# Patient Record
Sex: Female | Born: 1990 | Race: White | Hispanic: No | Marital: Married | Smoking: Current every day smoker
Health system: Southern US, Community
[De-identification: ages and names within clinical notes are randomized; demographics above are authoritative.]

## PROBLEM LIST (undated history)

## (undated) DIAGNOSIS — R109 Unspecified abdominal pain: Secondary | ICD-10-CM

## (undated) DIAGNOSIS — M549 Dorsalgia, unspecified: Secondary | ICD-10-CM

## (undated) DIAGNOSIS — M069 Rheumatoid arthritis, unspecified: Secondary | ICD-10-CM

## (undated) DIAGNOSIS — M5136 Other intervertebral disc degeneration, lumbar region: Secondary | ICD-10-CM

## (undated) DIAGNOSIS — R5383 Other fatigue: Secondary | ICD-10-CM

## (undated) DIAGNOSIS — Z789 Other specified health status: Secondary | ICD-10-CM

## (undated) DIAGNOSIS — J45909 Unspecified asthma, uncomplicated: Secondary | ICD-10-CM

## (undated) DIAGNOSIS — R002 Palpitations: Secondary | ICD-10-CM

## (undated) DIAGNOSIS — E039 Hypothyroidism, unspecified: Secondary | ICD-10-CM

## (undated) DIAGNOSIS — I10 Essential (primary) hypertension: Secondary | ICD-10-CM

## (undated) DIAGNOSIS — M51369 Other intervertebral disc degeneration, lumbar region without mention of lumbar back pain or lower extremity pain: Secondary | ICD-10-CM

## (undated) DIAGNOSIS — H5711 Ocular pain, right eye: Secondary | ICD-10-CM

## (undated) DIAGNOSIS — R768 Other specified abnormal immunological findings in serum: Secondary | ICD-10-CM

## (undated) DIAGNOSIS — F909 Attention-deficit hyperactivity disorder, unspecified type: Secondary | ICD-10-CM

## (undated) DIAGNOSIS — J321 Chronic frontal sinusitis: Secondary | ICD-10-CM

## (undated) HISTORY — PX: HYSTERECTOMY ABDOMINAL WITH SALPINGECTOMY: SHX6725

## (undated) HISTORY — DX: Other intervertebral disc degeneration, lumbar region without mention of lumbar back pain or lower extremity pain: M51.369

## (undated) HISTORY — DX: Other fatigue: R53.83

## (undated) HISTORY — DX: Chronic frontal sinusitis: J32.1

## (undated) HISTORY — DX: Morbid (severe) obesity due to excess calories: E66.01

## (undated) HISTORY — DX: Hypothyroidism, unspecified: E03.9

## (undated) HISTORY — DX: Other specified abnormal immunological findings in serum: R76.8

## (undated) HISTORY — PX: ABLATION: SHX5711

## (undated) HISTORY — PX: CHOLECYSTECTOMY: SHX55

## (undated) HISTORY — DX: Rheumatoid arthritis, unspecified: M06.9

## (undated) HISTORY — DX: Unspecified abdominal pain: R10.9

## (undated) HISTORY — DX: Other intervertebral disc degeneration, lumbar region: M51.36

## (undated) HISTORY — DX: Palpitations: R00.2

## (undated) HISTORY — DX: Ocular pain, right eye: H57.11

## (undated) HISTORY — DX: Attention-deficit hyperactivity disorder, unspecified type: F90.9

---

## 2018-04-13 ENCOUNTER — Other Ambulatory Visit: Payer: Self-pay

## 2018-04-13 ENCOUNTER — Encounter (HOSPITAL_COMMUNITY): Payer: Self-pay | Admitting: Emergency Medicine

## 2018-04-13 ENCOUNTER — Emergency Department (HOSPITAL_COMMUNITY)
Admission: EM | Admit: 2018-04-13 | Discharge: 2018-04-13 | Disposition: A | Discharge status: Home / Self Care | Payer: Self-pay | Attending: Emergency Medicine | Admitting: Emergency Medicine

## 2018-04-13 DIAGNOSIS — J45909 Unspecified asthma, uncomplicated: Secondary | ICD-10-CM | POA: Insufficient documentation

## 2018-04-13 DIAGNOSIS — R42 Dizziness and giddiness: Secondary | ICD-10-CM | POA: Insufficient documentation

## 2018-04-13 DIAGNOSIS — F17210 Nicotine dependence, cigarettes, uncomplicated: Secondary | ICD-10-CM | POA: Insufficient documentation

## 2018-04-13 DIAGNOSIS — G8929 Other chronic pain: Secondary | ICD-10-CM | POA: Insufficient documentation

## 2018-04-13 DIAGNOSIS — M5442 Lumbago with sciatica, left side: Secondary | ICD-10-CM | POA: Insufficient documentation

## 2018-04-13 HISTORY — DX: Dorsalgia, unspecified: M54.9

## 2018-04-13 HISTORY — DX: Unspecified asthma, uncomplicated: J45.909

## 2018-04-13 LAB — URINALYSIS, ROUTINE W REFLEX MICROSCOPIC
Bilirubin Urine: NEGATIVE
GLUCOSE, UA: NEGATIVE mg/dL
HGB URINE DIPSTICK: NEGATIVE
Ketones, ur: NEGATIVE mg/dL
Leukocytes, UA: NEGATIVE
Nitrite: NEGATIVE
PH: 7 (ref 5.0–8.0)
PROTEIN: NEGATIVE mg/dL
Specific Gravity, Urine: 1.008 (ref 1.005–1.030)

## 2018-04-13 LAB — CBC WITH DIFFERENTIAL/PLATELET
Abs Immature Granulocytes: 0.03 10*3/uL (ref 0.00–0.07)
BASOS ABS: 0 10*3/uL (ref 0.0–0.1)
Basophils Relative: 0 %
Eosinophils Absolute: 0.3 10*3/uL (ref 0.0–0.5)
Eosinophils Relative: 3 %
HEMATOCRIT: 40.8 % (ref 36.0–46.0)
Hemoglobin: 13.1 g/dL (ref 12.0–15.0)
IMMATURE GRANULOCYTES: 0 %
LYMPHS ABS: 2.5 10*3/uL (ref 0.7–4.0)
LYMPHS PCT: 25 %
MCH: 27.5 pg (ref 26.0–34.0)
MCHC: 32.1 g/dL (ref 30.0–36.0)
MCV: 85.7 fL (ref 80.0–100.0)
Monocytes Absolute: 0.6 10*3/uL (ref 0.1–1.0)
Monocytes Relative: 6 %
NRBC: 0 % (ref 0.0–0.2)
Neutro Abs: 6.5 10*3/uL (ref 1.7–7.7)
Neutrophils Relative %: 66 %
Platelets: 376 10*3/uL (ref 150–400)
RBC: 4.76 MIL/uL (ref 3.87–5.11)
RDW: 15.8 % — AB (ref 11.5–15.5)
WBC: 9.9 10*3/uL (ref 4.0–10.5)

## 2018-04-13 LAB — COMPREHENSIVE METABOLIC PANEL
ALBUMIN: 3.8 g/dL (ref 3.5–5.0)
ALT: 28 U/L (ref 0–44)
AST: 24 U/L (ref 15–41)
Alkaline Phosphatase: 62 U/L (ref 38–126)
Anion gap: 7 (ref 5–15)
BILIRUBIN TOTAL: 0.3 mg/dL (ref 0.3–1.2)
BUN: 8 mg/dL (ref 6–20)
CO2: 26 mmol/L (ref 22–32)
Calcium: 8.5 mg/dL — ABNORMAL LOW (ref 8.9–10.3)
Chloride: 109 mmol/L (ref 98–111)
Creatinine, Ser: 0.72 mg/dL (ref 0.44–1.00)
GFR calc Af Amer: >60 mL/min (ref >60)
GFR calc non Af Amer: >60 mL/min (ref >60)
GLUCOSE: 86 mg/dL (ref 70–99)
POTASSIUM: 3.8 mmol/L (ref 3.5–5.1)
Sodium: 142 mmol/L (ref 135–145)
TOTAL PROTEIN: 7.2 g/dL (ref 6.5–8.1)

## 2018-04-13 LAB — HCG, SERUM, QUALITATIVE: Preg, Serum: NEGATIVE

## 2018-04-13 MED ORDER — IBUPROFEN 600 MG PO TABS: 600.0000 mg | ORAL_TABLET | Freq: Four times a day (QID) | ORAL | 0 refills | Status: DC | PRN

## 2018-04-13 MED ORDER — MECLIZINE HCL 25 MG PO TABS: 25.0000 mg | ORAL_TABLET | Freq: Three times a day (TID) | ORAL | 0 refills | Status: DC | PRN

## 2018-04-13 MED ORDER — KETOROLAC TROMETHAMINE 60 MG/2ML IM SOLN
60.0000 mg | Freq: Once | INTRAMUSCULAR | Status: AC
  Administered 2018-04-13: 60 mg via INTRAMUSCULAR
  Filled 2018-04-13: qty 2

## 2018-04-13 MED ORDER — ACETAMINOPHEN 500 MG PO TABS
1000.0000 mg | ORAL_TABLET | Freq: Once | ORAL | Status: AC
  Administered 2018-04-13: 1000 mg via ORAL
  Filled 2018-04-13: qty 2

## 2018-04-13 NOTE — Discharge Instructions (Signed)
Baclofen may be causing her dizziness.  Try to limit use or take at night.  You may use Tylenol in addition to ibuprofen as needed for pain.  May also treat with heating pad establish care with a primary physician for ongoing medical needs.
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
## 2018-04-13 NOTE — ED Notes (Signed)
Pt informed we need a urine sample. Pt reports she cannot give a sample at this time.

## 2018-04-13 NOTE — ED Triage Notes (Addendum)
Patient complaining of lower back pain x 1 1/2 weeks. States she has history of back pain. Denies dysuria. Patient also complains of dizziness x 2 days.

## 2020-01-11 ENCOUNTER — Encounter (INDEPENDENT_AMBULATORY_CARE_PROVIDER_SITE_OTHER): Payer: Self-pay | Admitting: Nurse Practitioner

## 2020-01-11 ENCOUNTER — Other Ambulatory Visit: Payer: Self-pay

## 2020-01-11 ENCOUNTER — Ambulatory Visit (INDEPENDENT_AMBULATORY_CARE_PROVIDER_SITE_OTHER): Payer: Medicaid Other | Admitting: Nurse Practitioner

## 2020-01-11 VITALS — BP 130/100 | HR 96 | Temp 97.5°F | Ht 67.0 in | Wt 289.4 lb

## 2020-01-11 DIAGNOSIS — I10 Essential (primary) hypertension: Secondary | ICD-10-CM

## 2020-01-11 DIAGNOSIS — Z6841 Body Mass Index (BMI) 40.0 and over, adult: Secondary | ICD-10-CM

## 2020-01-11 DIAGNOSIS — F329 Major depressive disorder, single episode, unspecified: Secondary | ICD-10-CM

## 2020-01-11 DIAGNOSIS — M069 Rheumatoid arthritis, unspecified: Secondary | ICD-10-CM

## 2020-01-11 DIAGNOSIS — R41840 Attention and concentration deficit: Secondary | ICD-10-CM | POA: Diagnosis not present

## 2020-01-11 DIAGNOSIS — R5383 Other fatigue: Secondary | ICD-10-CM

## 2020-01-11 DIAGNOSIS — M5136 Other intervertebral disc degeneration, lumbar region: Secondary | ICD-10-CM

## 2020-01-11 DIAGNOSIS — F32A Depression, unspecified: Secondary | ICD-10-CM

## 2020-01-11 MED ORDER — HYDROCHLOROTHIAZIDE 12.5 MG PO TABS: 12.5000 mg | ORAL_TABLET | Freq: Every day | ORAL | 0 refills | Status: DC

## 2020-01-11 MED ORDER — HYDROCHLOROTHIAZIDE 12.5 MG PO TABS: 25.0000 mg | ORAL_TABLET | Freq: Every day | ORAL | 0 refills | Status: DC

## 2020-01-11 NOTE — Progress Notes (Signed)
Subjective:  Patient ID: Kristin Baker, female    DOB: 01/05/1991  Age: 29 y.o. MRN: [id number]  CC:  Chief Complaint  Patient presents with  . Hypertension    wants medication  . ADHD  . Back Pain    wants referral to orthopedic      HPI  This patient arrives today to establish care this office.  She tells me that she was without health insurance for some time so was without a primary care provider.  Now that she has health insurance she is trying to establish care with a primary care provider.  Her main complaints today include difficulties with attention, depressed mood, and back pain.  She tells me that she was diagnosed with ADHD as an adolescent.  She used to be on Adderall and Seroquel for insomnia.  She would like to be evaluated and treated for her ADHD.  She denies any suicidal thoughts.  She tells me that she does have a depressed mood intermittently and will sometimes go weeks without brushing her teeth or cleaning her home due to her mood.  As far as her back pain is concerned she tells me she has a history of degenerative disc disease and used to follow with orthopedic surgeon for treatment of this.  She would like to be referred back to an orthopedic surgeon for possible.   Past Medical History:  Diagnosis Date  . Asthma   . Back pain       No family history on file.  Social History   Social History Narrative  . Not on file   Social History   Tobacco Use  . Smoking status: Current Every Day Smoker  . Smokeless tobacco: Never Used  Substance Use Topics  . Alcohol use: Yes    Comment: occasionally     No outpatient medications have been marked as taking for the 01/11/20 encounter (Office Visit) with Ailene Ards, NP.    ROS:  Review of Systems  Eyes: Negative for blurred vision.  Respiratory: Negative for shortness of breath.   Cardiovascular: Negative for chest pain.  Musculoskeletal: Positive for joint pain.  Neurological: Positive for  dizziness.  Psychiatric/Behavioral: Negative for suicidal ideas. The patient has insomnia.        (+) difficulty with attention     Objective:   Today's Vitals: BP (!) 130/100 (BP Location: Left Arm, Patient Position: Sitting, Cuff Size: Normal)   Pulse 96   Temp (!) 97.5 F (36.4 C) (Temporal)   Ht '5\' 7"'  (1.702 m)   Wt 289 lb 6.4 oz (131.3 kg)   SpO2 95%   BMI 45.33 kg/m  Vitals with BMI 01/11/2020 04/13/2018 04/13/2018  Height '5\' 7"'  - '5\' 7"'   Weight 289 lbs 6 oz - 220 lbs  BMI 65.46 - 50.35  Systolic 465 - -  Diastolic 681 - -  Pulse 96 62 -     Physical Exam Vitals reviewed.  Constitutional:      General: She is not in acute distress.    Appearance: Normal appearance.  HENT:     Head: Normocephalic and atraumatic.  Neck:     Vascular: No carotid bruit.  Cardiovascular:     Rate and Rhythm: Normal rate and regular rhythm.     Pulses: Normal pulses.     Heart sounds: Normal heart sounds.  Pulmonary:     Effort: Pulmonary effort is normal.     Breath sounds: Normal breath sounds.  Skin:  General: Skin is warm and dry.  Neurological:     General: No focal deficit present.     Mental Status: She is alert and oriented to person, place, and time.  Psychiatric:        Mood and Affect: Mood normal.        Behavior: Behavior normal.        Judgment: Judgment normal.          Assessment and Plan   1. Hypertension, unspecified type   2. Attention deficit   3. Depression, unspecified depression type   4. DDD (degenerative disc disease), lumbar   5. Class 3 severe obesity with serious comorbidity and body mass index (BMI) of 45.0 to 49.9 in adult, unspecified obesity type ([place])   6. Fatigue, unspecified type   7. Rheumatoid arthritis, involving unspecified site, unspecified whether rheumatoid factor present ([place])      Plan: 1.  Blood pressure a bit elevated today.  Will initiate her on low-dose hydrochlorothiazide.  She will follow-up in 2 weeks to repeat  CMP and check on her blood pressure. 2., 3., 6.  We will refer her to psychiatry for further evaluation and management of her ADHD and depression. 4.  We will refer her to orthopedic surgeon for assistance with evaluating and managing her degenerative disc disease 5.  We will collect blood work for further evaluation.  We will need to discuss lifestyle changes when I see her next office visit. 7.  During review of systems she did mention she has quite a bit of joint pain and was once diagnosed with rheumatoid arthritis.  She is concerned she may have lupus as she has a family history of this as well.  I will start by checking some additional blood work today for further evaluation.  Tests ordered Orders Placed This Encounter  Procedures  . CBC  . CMP with eGFR([facility])  . Lipid Panel  . Hemoglobin A1c  . TSH  . T3, Free  . T4, Free  . ANA  . Anti-Smith antibody  . Rheumatoid Factor  . Ambulatory referral to Psychiatry  . Ambulatory referral to Orthopedic Surgery      Meds ordered this encounter  Medications  . hydrochlorothiazide (HYDRODIURIL) 12.5 MG tablet    Sig: Take 2 tablets (25 mg total) by mouth daily.    Dispense:  90 tablet    Refill:  0    Order Specific Question:   Supervising Provider    Answer:   Doree Albee [[id number]]    Patient to follow-up in 2 weeks or sooner as needed  Ailene Ards, NP

## 2020-01-11 NOTE — Addendum Note (Signed)
Addended by: Jiles Prows E on: 01/11/2020 12:12 PM   Modules accepted: Orders

## 2020-01-13 ENCOUNTER — Ambulatory Visit
Admission: EM | Admit: 2020-01-13 | Discharge: 2020-01-13 | Disposition: A | Discharge status: Home / Self Care | Payer: Medicaid Other | Attending: Emergency Medicine | Admitting: Emergency Medicine

## 2020-01-13 ENCOUNTER — Encounter (INDEPENDENT_AMBULATORY_CARE_PROVIDER_SITE_OTHER): Payer: Self-pay | Admitting: Nurse Practitioner

## 2020-01-13 ENCOUNTER — Other Ambulatory Visit: Payer: Self-pay

## 2020-01-13 ENCOUNTER — Encounter: Payer: Self-pay | Admitting: Emergency Medicine

## 2020-01-13 DIAGNOSIS — M79601 Pain in right arm: Secondary | ICD-10-CM | POA: Diagnosis not present

## 2020-01-13 DIAGNOSIS — M25521 Pain in right elbow: Secondary | ICD-10-CM | POA: Diagnosis not present

## 2020-01-13 HISTORY — DX: Essential (primary) hypertension: I10

## 2020-01-13 LAB — T4, FREE: Free T4: 0.7 ng/dL — ABNORMAL LOW (ref 0.8–1.8)

## 2020-01-13 LAB — LIPID PANEL
Cholesterol: 223 mg/dL — ABNORMAL HIGH (ref <200)
HDL: 32 mg/dL — ABNORMAL LOW (ref >=50)
LDL Cholesterol (Calc): 166 mg/dL (calc) — ABNORMAL HIGH
Non-HDL Cholesterol (Calc): 191 mg/dL (calc) — ABNORMAL HIGH (ref <130)
Total CHOL/HDL Ratio: 7 (calc) — ABNORMAL HIGH (ref <5.0)
Triglycerides: 119 mg/dL (ref <150)

## 2020-01-13 LAB — CBC
HCT: 40.2 % (ref 35.0–45.0)
Hemoglobin: 13.6 g/dL (ref 11.7–15.5)
MCH: 29.4 pg (ref 27.0–33.0)
MCHC: 33.8 g/dL (ref 32.0–36.0)
MCV: 86.8 fL (ref 80.0–100.0)
MPV: 10.9 fL (ref 7.5–12.5)
Platelets: 383 10*3/uL (ref 140–400)
RBC: 4.63 10*6/uL (ref 3.80–5.10)
RDW: 13.5 % (ref 11.0–15.0)
WBC: 9.9 10*3/uL (ref 3.8–10.8)

## 2020-01-13 LAB — ANTI-NUCLEAR AB-TITER (ANA TITER): ANA Titer 1: 1:80 {titer} — ABNORMAL HIGH

## 2020-01-13 LAB — HEMOGLOBIN A1C
Hgb A1c MFr Bld: 5.1 % of total Hgb (ref <5.7)
Mean Plasma Glucose: 100 (calc)
eAG (mmol/L): 5.5 (calc)

## 2020-01-13 LAB — COMPLETE METABOLIC PANEL WITH GFR
AG Ratio: 1.5 (calc) (ref 1.0–2.5)
ALT: 15 U/L (ref 6–29)
AST: 14 U/L (ref 10–30)
Albumin: 4.1 g/dL (ref 3.6–5.1)
Alkaline phosphatase (APISO): 78 U/L (ref 31–125)
BUN: 8 mg/dL (ref 7–25)
CO2: 23 mmol/L (ref 20–32)
Calcium: 9 mg/dL (ref 8.6–10.2)
Chloride: 105 mmol/L (ref 98–110)
Creat: 0.71 mg/dL (ref 0.50–1.10)
GFR, Est African American: 133 mL/min/{1.73_m2} (ref >=60)
GFR, Est Non African American: 115 mL/min/{1.73_m2} (ref >=60)
Globulin: 2.7 g/dL (calc) (ref 1.9–3.7)
Glucose, Bld: 87 mg/dL (ref 65–99)
Potassium: 4 mmol/L (ref 3.5–5.3)
Sodium: 136 mmol/L (ref 135–146)
Total Bilirubin: 0.3 mg/dL (ref 0.2–1.2)
Total Protein: 6.8 g/dL (ref 6.1–8.1)

## 2020-01-13 LAB — ANTI-SMITH ANTIBODY: ENA SM Ab Ser-aCnc: <1 AI

## 2020-01-13 LAB — ANA: Anti Nuclear Antibody (ANA): POSITIVE — AB

## 2020-01-13 LAB — RHEUMATOID FACTOR: Rhuematoid fact SerPl-aCnc: <14 IU/mL (ref <14)

## 2020-01-13 LAB — TSH: TSH: 4.35 mIU/L

## 2020-01-13 LAB — T3, FREE: T3, Free: 2.9 pg/mL (ref 2.3–4.2)

## 2020-01-13 MED ORDER — IBUPROFEN 800 MG PO TABS: 800.0000 mg | ORAL_TABLET | Freq: Three times a day (TID) | ORAL | 0 refills | Status: DC

## 2020-01-13 NOTE — Discharge Instructions (Signed)
X-ray was ordered /have it completed at anything tomorrow Follow RICE instruction as attached Tylenol was prescribed/take with food Follow-up with PCP Return or go to ED for worsening of symptoms

## 2020-01-13 NOTE — ED Triage Notes (Signed)
Pain to RT elbow that 3-4 weeks ago, pain then went down her forearm and now pain is up to her shoulder. Pt states she did fall a couple of times during the time is started hurting but not sure if its coming from that.

## 2020-01-13 NOTE — ED Provider Notes (Signed)
[facility]   [id number] 01/13/20 Arrival Time: 1925  Chief Complaint  Patient presents with  . Arm Pain     SUBJECTIVE: History from: patient.  Kristin Baker is a 29 y.o. female who presented to the urgent care for complaint of right elbow pain for the past 3 to 4 weeks.  Reported worsening symptoms today after working for [employer]  States she had a fall about 4 to 6 weeks ago.  Localized pain to the right elbow that radiated to right shoulder.  She describes the pain as constant and achy.  She has tried OTC medications without relief.  Her symptoms are made worse with ROM.  He denies similar symptoms in the past.  Denies chills, fever, nausea, vomiting, diarrhea  ROS: As per HPI.  All other pertinent ROS negative.     Past Medical History:  Diagnosis Date  . ADHD   . Asthma   . Back pain   . DDD (degenerative disc disease), lumbar   . Hypertension   . Rheumatoid arthritis (H[facility])    History reviewed. No pertinent surgical history. Allergies  Allergen Reactions  . Flexeril [Cyclobenzaprine] Hives   No current facility-administered medications on file prior to encounter.   Current Outpatient Medications on File Prior to Encounter  Medication Sig Dispense Refill  . hydrochlorothiazide (HYDRODIURIL) 12.5 MG tablet Take 1 tablet (12.5 mg total) by mouth daily. 90 tablet 0   Social History   Socioeconomic History  . Marital status: Media planner    Spouse name: Not on file  . Number of children: Not on file  . Years of education: Not on file  . Highest education level: Not on file  Occupational History  . Not on file  Tobacco Use  . Smoking status: Current Every Day Smoker    Packs/day: 1.00    Types: Cigarettes  . Smokeless tobacco: Never Used  Substance and Sexual Activity  . Alcohol use: Yes    Comment: occasionally  . Drug use: Never  . Sexual activity: Not on file  Other Topics Concern  . Not on file  Social History Narrative  . Not on file     Social Determinants of Health   Financial Resource Strain:   . Difficulty of Paying Living Expenses: Not on file  Food Insecurity:   . Worried About Programme researcher, broadcasting/film/video in the Last Year: Not on file  . Ran Out of Food in the Last Year: Not on file  Transportation Needs:   . Lack of Transportation (Medical): Not on file  . Lack of Transportation (Non-Medical): Not on file  Physical Activity:   . Days of Exercise per Week: Not on file  . Minutes of Exercise per Session: Not on file  Stress:   . Feeling of Stress : Not on file  Social Connections:   . Frequency of Communication with Friends and Family: Not on file  . Frequency of Social Gatherings with Friends and Family: Not on file  . Attends Religious Services: Not on file  . Active Member of Clubs or Organizations: Not on file  . Attends Banker Meetings: Not on file  . Marital Status: Not on file  Intimate Partner Violence:   . Fear of Current or Ex-Partner: Not on file  . Emotionally Abused: Not on file  . Physically Abused: Not on file  . Sexually Abused: Not on file   Family History  Problem Relation Age of Onset  . Lupus Other  OBJECTIVE:  Vitals:   01/13/20 1945 01/13/20 1947  BP:  122/82  Pulse:  99  Resp:  19  Temp:  98 F (36.7 C)  TempSrc:  Oral  SpO2:  96%  Weight: 289 lb (131.1 kg)   Height: 5\' 7"  (1.702 m)      Physical Exam Vitals and nursing note reviewed.  Constitutional:      General: She is not in acute distress.    Appearance: Normal appearance. She is normal weight. She is not ill-appearing, toxic-appearing or diaphoretic.  HENT:     Head: Normocephalic.  Cardiovascular:     Rate and Rhythm: Normal rate and regular rhythm.     Pulses: Normal pulses.     Heart sounds: Normal heart sounds. No murmur heard.  No friction rub. No gallop.   Pulmonary:     Effort: Pulmonary effort is normal. No respiratory distress.     Breath sounds: Normal breath sounds. No stridor. No  wheezing, rhonchi or rales.  Chest:     Chest wall: No tenderness.  Musculoskeletal:        General: Tenderness present.     Right elbow: Tenderness present.     Left elbow: Normal.     Comments: The right elbow is without any obvious asymmetry or deformity compared to the left elbow.  No surface trauma, ecchymosis or open wound.  Limited range of motion due to pain neurovascular status intact  Neurological:     Mental Status: She is alert and oriented to person, place, and time.     LABS:  No results found for this or any previous visit (from the past 24 hour(s)).   ASSESSMENT & PLAN:  1. Right arm pain   2. Right elbow pain     Meds ordered this encounter  Medications  . ibuprofen (ADVIL) 800 MG tablet    Sig: Take 1 tablet (800 mg total) by mouth 3 (three) times daily.    Dispense:  30 tablet    Refill:  0    Discharge Instruction.   X-ray was ordered /have it completed at anything tomorrow Follow RICE instruction as attached Tylenol was prescribed/take with food Follow-up with PCP Return or go to ED for worsening of symptoms  Reviewed expectations re: course of current medical issues. Questions answered. Outlined signs and symptoms indicating need for more acute intervention. Patient verbalized understanding. After Visit Summary given.      Note: This document was prepared using Dragon voice recognition software and may include unintentional dictation errors.    , FNP 01/13/20 2038

## 2020-01-14 ENCOUNTER — Ambulatory Visit (HOSPITAL_COMMUNITY)
Admission: RE | Admit: 2020-01-14 | Discharge: 2020-01-14 | Disposition: A | Discharge status: Home / Self Care | Payer: Medicaid Other | Source: Ambulatory Visit | Attending: Emergency Medicine | Admitting: Emergency Medicine

## 2020-01-14 ENCOUNTER — Other Ambulatory Visit: Payer: Self-pay

## 2020-01-14 DIAGNOSIS — M79601 Pain in right arm: Secondary | ICD-10-CM | POA: Insufficient documentation

## 2020-01-14 IMAGING — DX DG FOREARM 2V*R*
2 series · 2 of 2 positions shown · non-contrast
Comparison: None.

CLINICAL DATA: Fell.  Right arm pain.

EXAM:
RIGHT FOREARM - 2 VIEW

[forearm ap]
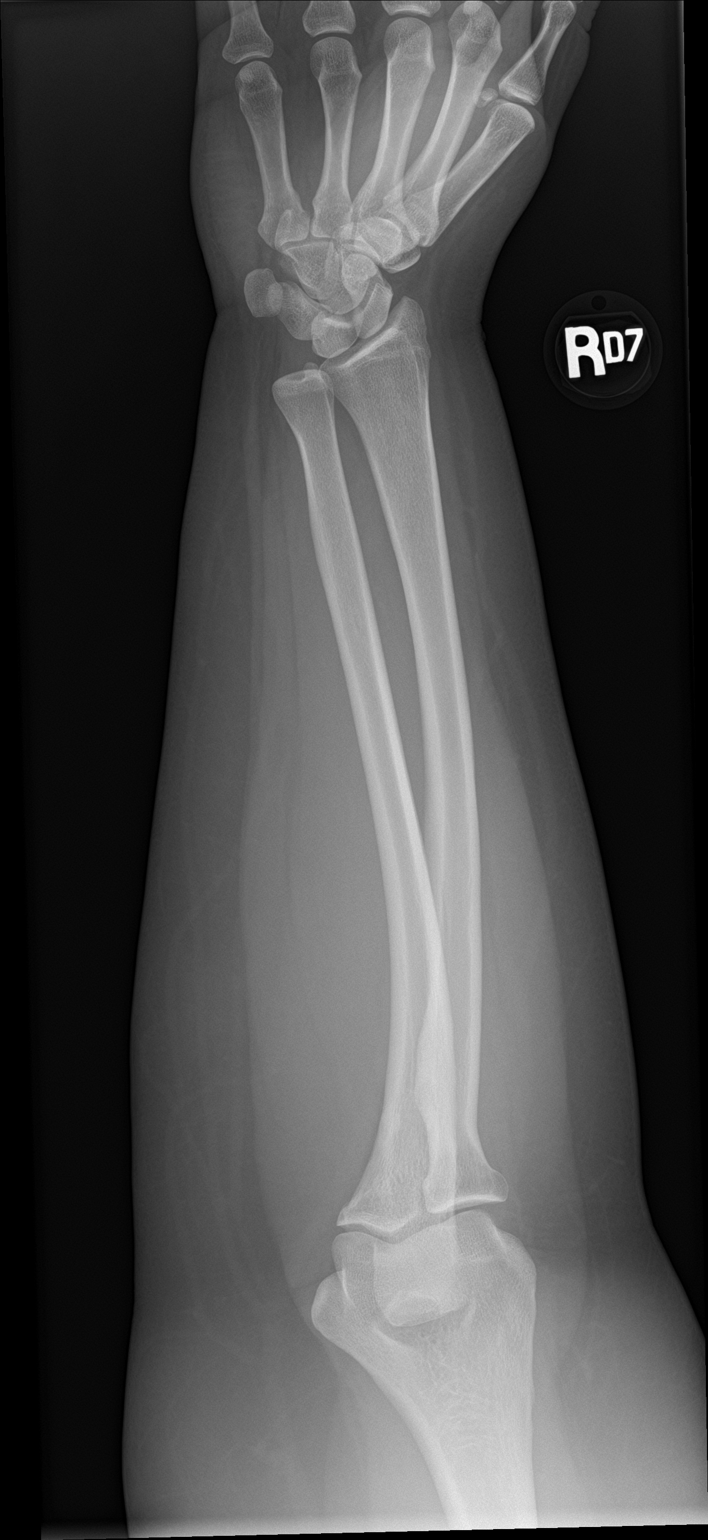

[forearm lat]
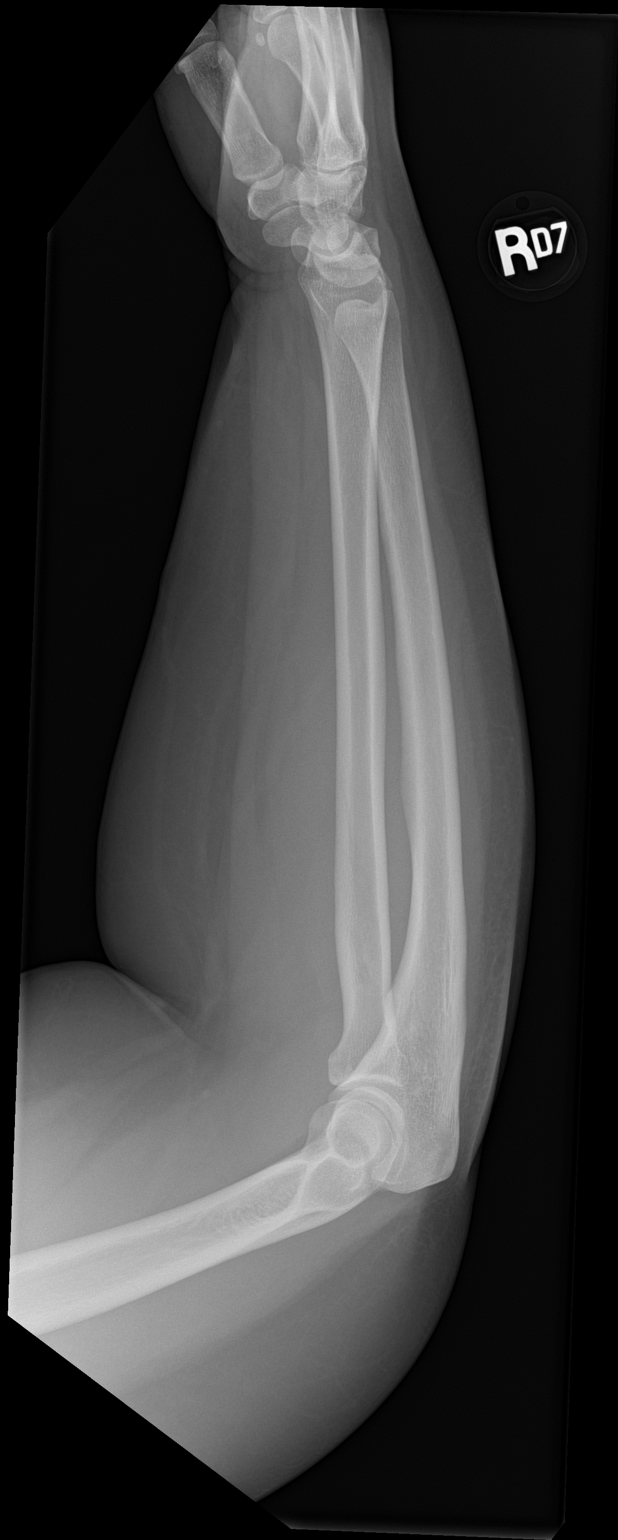

[2 of 2 positions shown; findings below may reference images not displayed]

FINDINGS: The wrist and elbow joints are maintained. No acute forearm fracture
is identified.
IMPRESSION: No acute bony findings.

## 2020-01-15 ENCOUNTER — Other Ambulatory Visit: Payer: Self-pay

## 2020-01-15 ENCOUNTER — Emergency Department (HOSPITAL_COMMUNITY): Payer: Medicaid Other

## 2020-01-15 ENCOUNTER — Emergency Department (HOSPITAL_COMMUNITY)
Admission: EM | Admit: 2020-01-15 | Discharge: 2020-01-15 | Disposition: A | Discharge status: Home / Self Care | Payer: Medicaid Other | Attending: Emergency Medicine | Admitting: Emergency Medicine

## 2020-01-15 ENCOUNTER — Encounter (HOSPITAL_COMMUNITY): Payer: Self-pay

## 2020-01-15 DIAGNOSIS — M79601 Pain in right arm: Secondary | ICD-10-CM | POA: Diagnosis present

## 2020-01-15 DIAGNOSIS — M069 Rheumatoid arthritis, unspecified: Secondary | ICD-10-CM | POA: Diagnosis not present

## 2020-01-15 DIAGNOSIS — F17210 Nicotine dependence, cigarettes, uncomplicated: Secondary | ICD-10-CM | POA: Diagnosis not present

## 2020-01-15 DIAGNOSIS — Z79899 Other long term (current) drug therapy: Secondary | ICD-10-CM | POA: Insufficient documentation

## 2020-01-15 DIAGNOSIS — I10 Essential (primary) hypertension: Secondary | ICD-10-CM | POA: Insufficient documentation

## 2020-01-15 IMAGING — DX DG ELBOW COMPLETE 3+V*R*
4 series · 4 of 4 positions shown · non-contrast
Comparison: None.

CLINICAL DATA: Injury 3-4 weeks prior, point tenderness

EXAM:
RIGHT ELBOW - COMPLETE 3+ VIEW

[elbow ap]
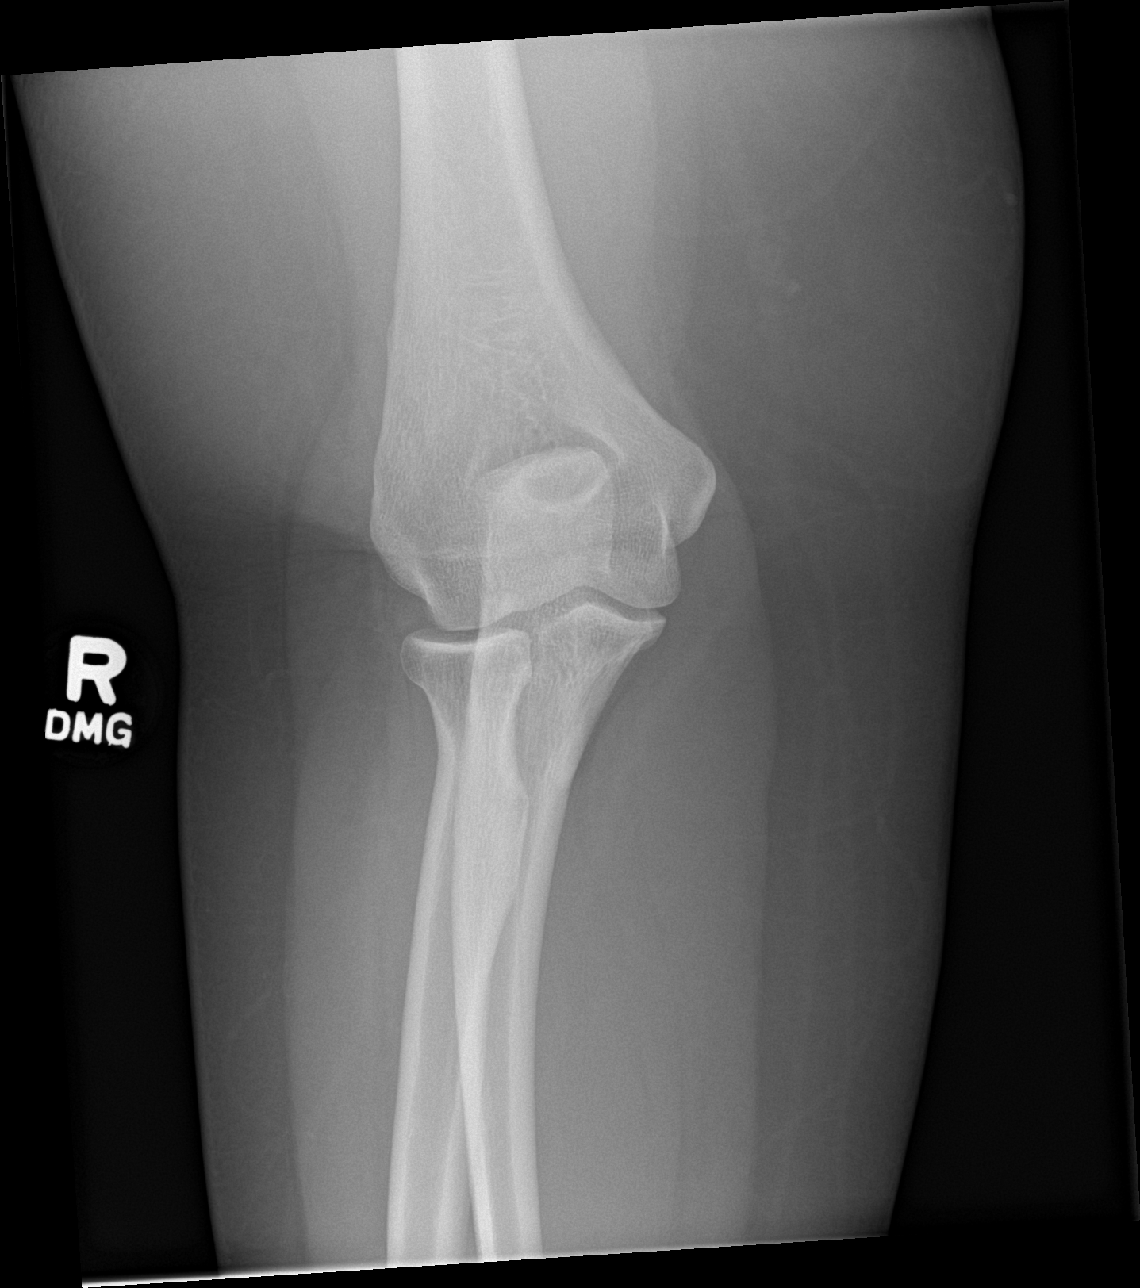

[elbow obl (1 of 2)]
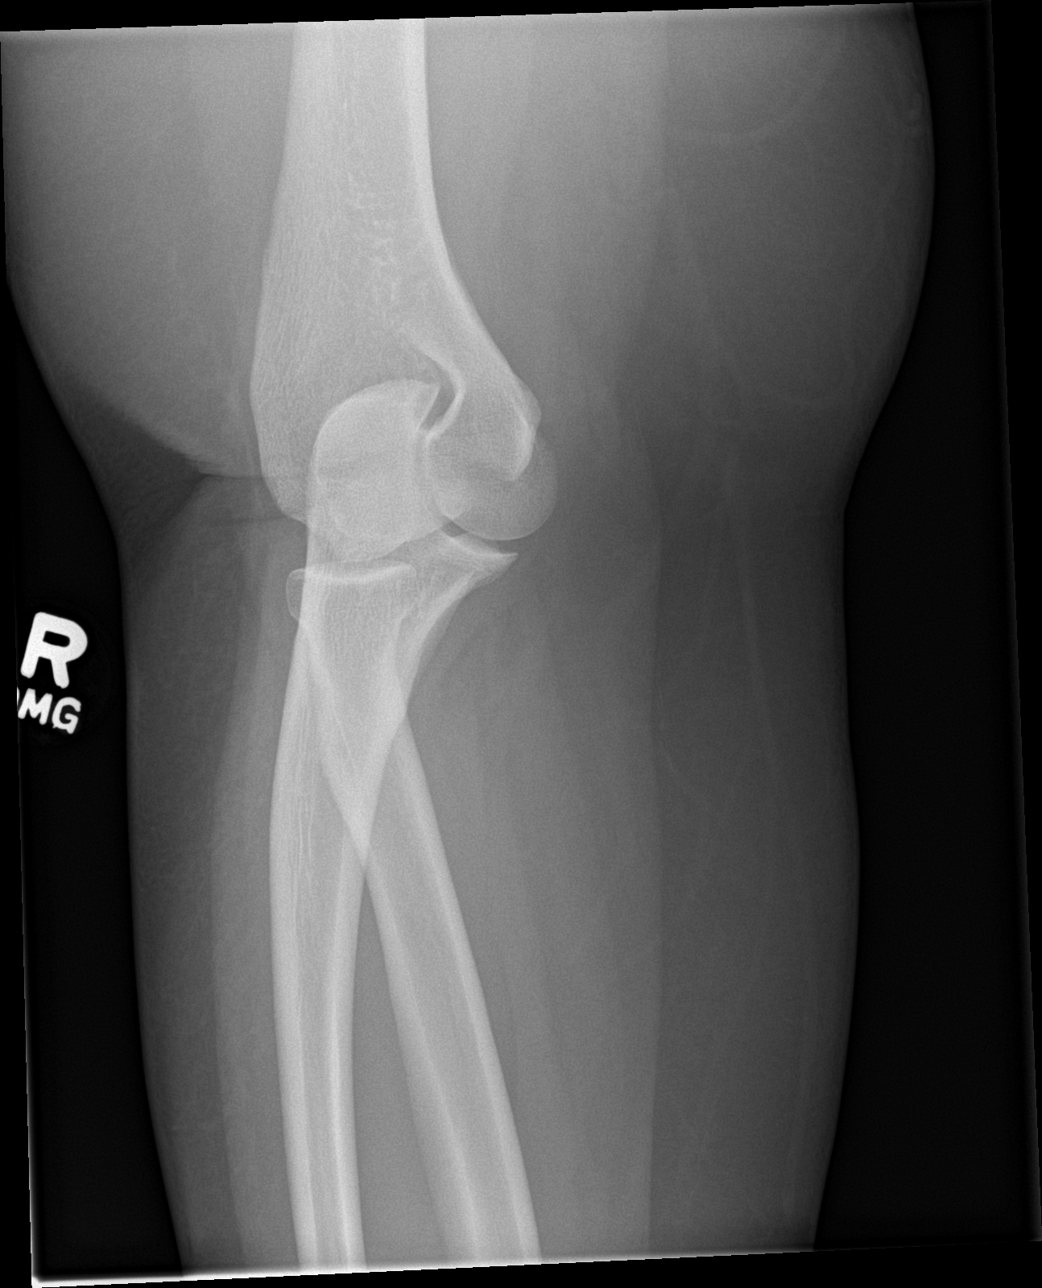

[elbow obl (2 of 2)]
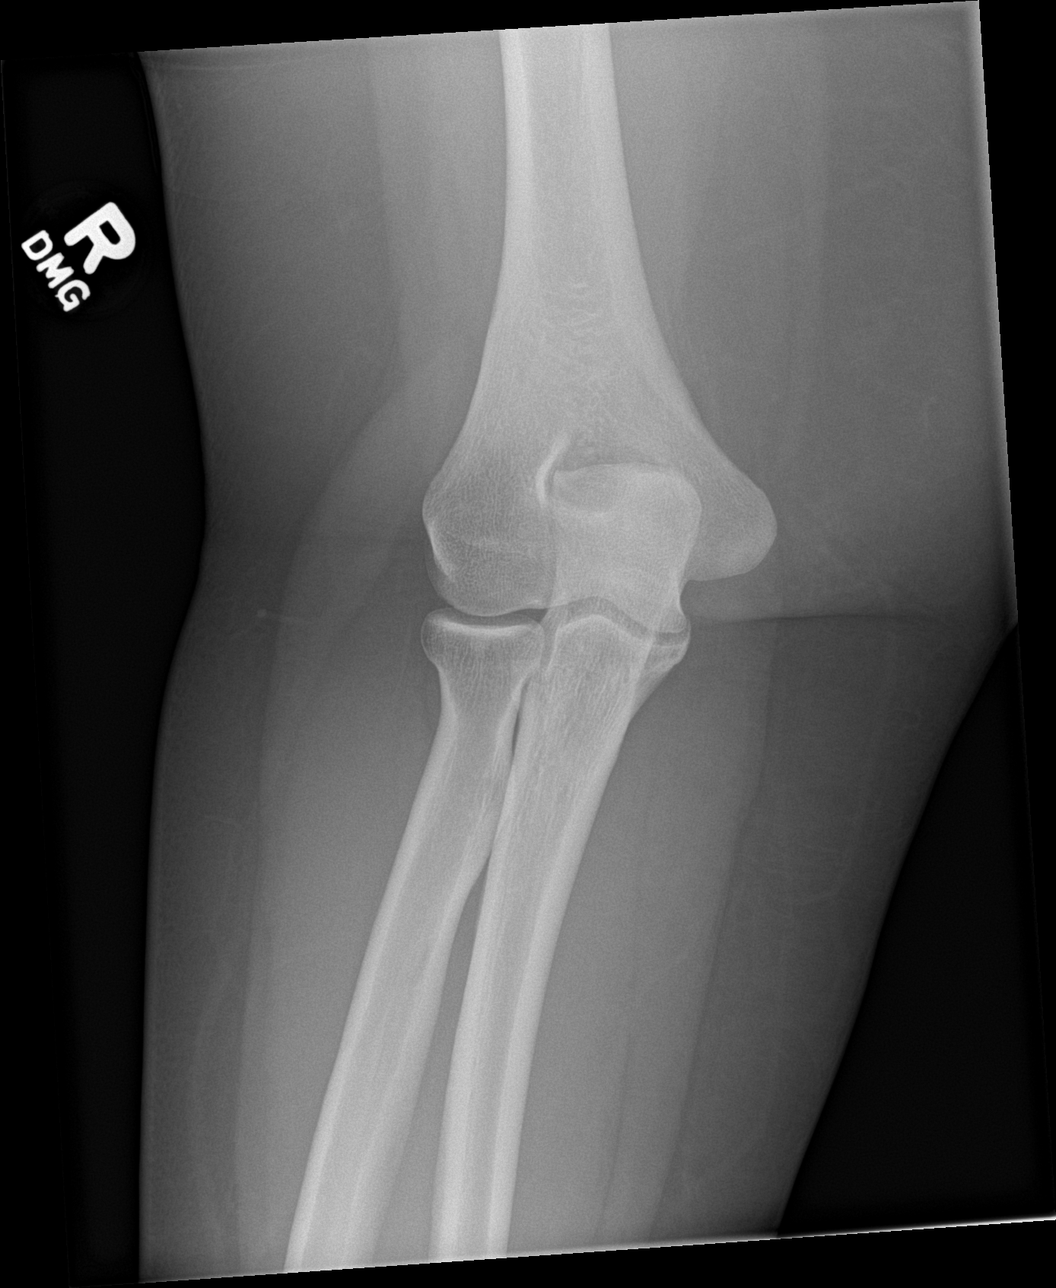

[elbow lat]
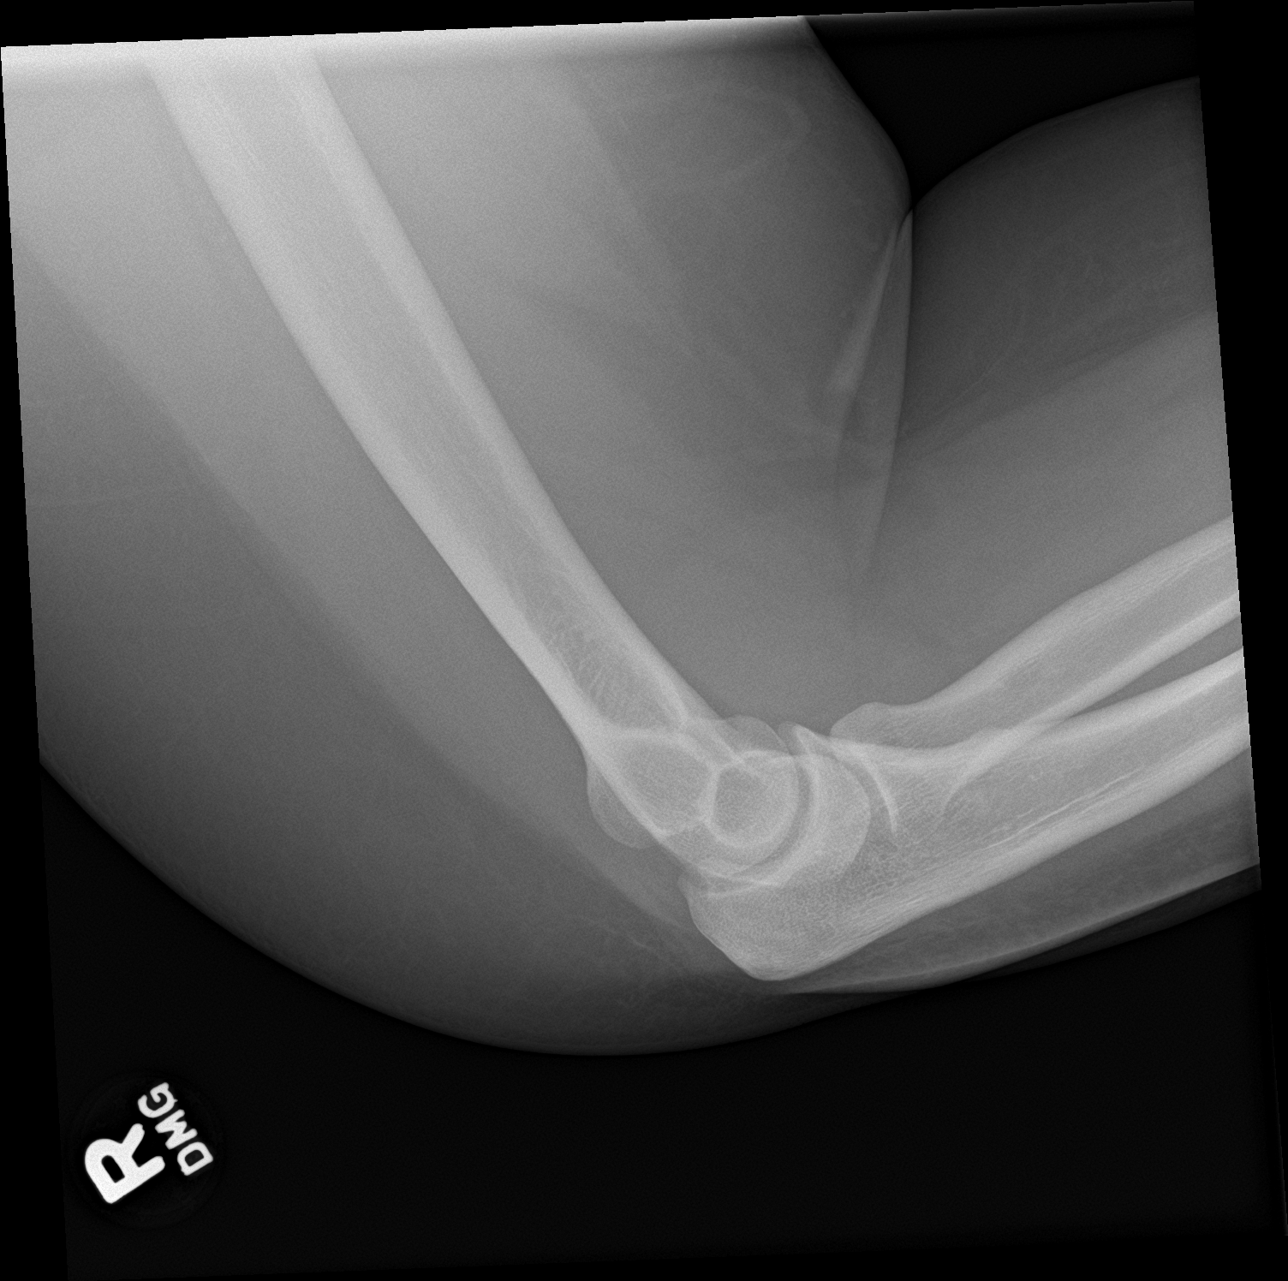

[4 of 4 positions shown; findings below may reference images not displayed]

FINDINGS: No acute or healing fractures are convincingly demonstrated. No
traumatic malalignment is seen. No sizable effusion or displacement
of the fat pads. Soft tissues are unremarkable.
IMPRESSION: No acute or healing fractures, traumatic malalignment or effusion
are convincingly demonstrated.

## 2020-01-15 NOTE — ED Triage Notes (Signed)
Pt reports she slipped in grass 3 weeks ago. Pt had xray thursday morning ordered by urgent care. Pt reports that she has been unable to get the results. Reports elbow initially hurt now hurts up to shoulder and hand swollen

## 2020-01-15 NOTE — ED Provider Notes (Signed)
[facility] EMERGENCY DEPARTMENT Provider Note   CSN: [id number] Arrival date & time: 01/15/20  1740     History Chief Complaint  Patient presents with  . Arm Pain    Kristin Baker is a 29 y.o. female possible history of ADHD, DDD, hypertension, rheumatoid arthritis presents for evaluation of right upper extremity pain that has been ongoing for last 3 weeks.  She states pain initially started about 3 weeks ago when she was taking out her trash, slipped and fell.  States she hurt her back as well as her right upper extremity.  She does not know how she injured her right upper extremity extremity pain in the right elbow and proximal forearm afterwards.  She states that she thought it was just a bad fall and so she continued to doctored at home.  She states that despite doing over-the-counter Tylenol, she was continue to have pain.  She went to urgent care yesterday and was sent to get a x-ray but did not know the results of the x-ray.  She was prescribed ibuprofen by urgent care and given a sling for support and stabilization.  She comes in the emergency room today because she states pain is worsening.  She felt like her hand was getting swollen.  She states that the pain radiates up her shoulder and down her arm.  She states it is worse with movement.  Denies any numbness/weakness.  The history is provided by the patient.       Past Medical History:  Diagnosis Date  . ADHD   . Back pain   . DDD (degenerative disc disease), lumbar   . Hypertension   . Rheumatoid arthritis [facility])     Patient Active Problem List   Diagnosis Date Noted  . DDD (degenerative disc disease), lumbar 01/11/2020    History reviewed. No pertinent surgical history.   OB History   No obstetric history on file.     Family History  Problem Relation Age of Onset  . Lupus Other     Social History   Tobacco Use  . Smoking status: Current Every Day Smoker    Packs/day: 1.00    Types: Cigarettes  .  Smokeless tobacco: Never Used  Substance Use Topics  . Alcohol use: Yes    Comment: occasionally  . Drug use: Never    Home Medications Prior to Admission medications   Medication Sig Start Date End Date Taking? Authorizing Provider  hydrochlorothiazide (HYDRODIURIL) 12.5 MG tablet Take 1 tablet (12.5 mg total) by mouth daily. 01/11/20   Elenore Paddy, NP  ibuprofen (ADVIL) 800 MG tablet Take 1 tablet (800 mg total) by mouth 3 (three) times daily. 01/13/20   Avegno, Zachery Dakins, FNP    Allergies    Flexeril [cyclobenzaprine]  Review of Systems   Review of Systems  Constitutional: Negative for fever.  Musculoskeletal:       RUE pain  Neurological: Negative for weakness and numbness.  All other systems reviewed and are negative.   Physical Exam Updated Vital Signs BP (!) 161/91 (BP Location: Left Wrist)   Pulse 81   Temp 98.6 F (37 C) (Oral)   Resp 20   SpO2 97%   Physical Exam Vitals and nursing note reviewed.  Constitutional:      Appearance: She is well-developed.  HENT:     Head: Normocephalic and atraumatic.  Eyes:     General: No scleral icterus.       Right eye: No discharge.  Left eye: No discharge.     Conjunctiva/sclera: Conjunctivae normal.  Cardiovascular:     Pulses:          Radial pulses are 2+ on the right side and 2+ on the left side.  Pulmonary:     Effort: Pulmonary effort is normal.  Musculoskeletal:     Comments: Tenderness palpation noted to the proximal right forearm as well as the elbow.  No overlying soft tissue swelling or ecchymosis noted.  Lection/extension intact but with subjective reports of pain.  She is holding in a flexed position.  Compartments are soft.  No deformity or crepitus noted.  No bony tenderness noted to the right shoulder, right forearm, right wrist.  No tenderness palpation left upper extremity.  She can easily make a fist with her right hand.  Skin:    General: Skin is warm and dry.     Comments: Good distal cap  refill.  RUE is not dusky in appearance or cool to touch.  Neurological:     Mental Status: She is alert.     Comments: Sensation intact along major nerve distributions of RUE  Psychiatric:        Speech: Speech normal.        Behavior: Behavior normal.     ED Results / Procedures / Treatments   Labs (all labs ordered are listed, but only abnormal results are displayed) Labs Reviewed - No data to display  EKG None  Radiology DG Elbow Complete Right  Result Date: 01/15/2020 CLINICAL DATA:  Injury 3-4 weeks prior, point tenderness EXAM: RIGHT ELBOW - COMPLETE 3+ VIEW COMPARISON:  None. FINDINGS: No acute or healing fractures are convincingly demonstrated. No traumatic malalignment is seen. No sizable effusion or displacement of the fat pads. Soft tissues are unremarkable. IMPRESSION: No acute or healing fractures, traumatic malalignment or effusion are convincingly demonstrated. Electronically Signed   By: Kreg Shropshire M.D.   On: 01/15/2020 19:40   DG Forearm Right  Result Date: 01/14/2020 CLINICAL DATA:  Larey Seat.  Right arm pain. EXAM: RIGHT FOREARM - 2 VIEW COMPARISON:  None. FINDINGS: The wrist and elbow joints are maintained. No acute forearm fracture is identified. IMPRESSION: No acute bony findings. Electronically Signed   By: Rudie Meyer M.D.   On: 01/14/2020 15:37    Procedures Procedures (including critical care time)  Medications Ordered in ED Medications - No data to display  ED Course  I have reviewed the triage vital signs and the nursing notes.  Pertinent labs & imaging results that were available during my care of the patient were reviewed by me and considered in my medical decision making (see chart for details).    MDM Rules/Calculators/A&P                          29 year old female who presents for evaluation of right upper extremity pain x3 days.  Started after mechanical fall.  Seen by urgent care and had x-ray done that showed no evidence of  abnormalities.  Comes in today because she continues to have pain.  She fell your hand with swelling as well.  On initial arrival, she is afebrile, nontoxic-appearing.  Vital signs are stable.  On exam, she has point tenderness noted to the right elbow and proximal forearm.  No bony tenderness of the right shoulder or right forearm or wrist.  She states that she feels like the pain radiates there.  On my evaluation, her hands appear  symmetric in appearance with no overlying soft tissue swelling, no swelling noted to arm.  Exam is not concerning for compartment syndrome.  No evidence of warmth, erythema that would be concerning for infectious etiology.  History/physical exam not concerning for ischemic limb, septic arthritis, DVT.  She does have some point tenderness noted on the right elbow so we will get a dedicated elbow film here in the ED for evaluation of any acute abnormality.  I reviewed her forearm x-ray done from yesterday which showed no evidence of acute bony abnormality.  X-ray of elbow reviewed.  No evidence of acute traumatic injury.  No evidence of joint effusion or any other worrisome or concerning signs or could be secondary for underlying fracture.  Discussed results with patient.  I discussed with her that she continue taking over-the-counter NSAIDs for pain relief.  Patient was provided a sling immobilizer by urgent care.  Instructed her to continue using that for support and stabilization.  I discussed with her that there still could be a muscle, ligament, tendon injury that would not show up on x-ray.  I discussed with her that if she continues to have persistent pain, she can follow-up with orthopedics.  We will plan to give her outpatient orthopedic referral.  At this time, given lack of acute bony injury, do not feel that narcotic pain medication is warranted in this instance. At this time, patient exhibits no emergent life-threatening condition that require further evaluation in ED.  Patient had ample opportunity for questions and discussion. All patient's questions were answered with full understanding. Strict return precautions discussed. Patient expresses understanding and agreement to plan.   Portions of this note were generated with Scientist, clinical (histocompatibility and immunogenetics). Dictation errors may occur despite best attempts at proofreading.   Final Clinical Impression(s) / ED Diagnoses Final diagnoses:  Right arm pain    Rx / DC Orders ED Discharge Orders    None       Rosana Hoes 01/15/20 2158    Bethann Berkshire, MD 01/15/20 2207

## 2020-01-15 NOTE — Discharge Instructions (Addendum)
You can take Tylenol or Ibuprofen as directed for pain. You can alternate Tylenol and Ibuprofen every 4 hours. If you take Tylenol at 1pm, then you can take Ibuprofen at 5pm. Then you can take Tylenol again at 9pm.   You can wear the sling for support and stabilization.  You should not wear it 24/7.  At times at home, you should take the arm out and do gentle range of motion exercises to prevent frozen shoulder syndrome.  As we discussed, there is no evidence of broken bones on your x-rays.  There could still be a ligament or muscle or tendon injury that is not seen on x-ray.  If you continue have persistent pain, he should follow-up with orthopedic.  I have given you a referral to 2 orthopedic doctors that you can follow-up with.  Return the emergency department for any worsening pain, discoloration of the arm, fevers, numbness/weakness or any other worsening concerning symptoms.

## 2020-01-15 NOTE — ED Notes (Signed)
Pt tearful and speaking on phone   She hears discharge instructions, refuses to allow VS, or assist with sling

## 2020-01-17 ENCOUNTER — Telehealth (INDEPENDENT_AMBULATORY_CARE_PROVIDER_SITE_OTHER): Payer: Self-pay | Admitting: Internal Medicine

## 2020-01-17 NOTE — Telephone Encounter (Signed)
She will need an appointment to be seen by me or Sarah before she can get referral for any kind of imaging.

## 2020-01-19 ENCOUNTER — Ambulatory Visit (INDEPENDENT_AMBULATORY_CARE_PROVIDER_SITE_OTHER): Payer: Medicaid Other | Admitting: Internal Medicine

## 2020-01-26 ENCOUNTER — Telehealth (INDEPENDENT_AMBULATORY_CARE_PROVIDER_SITE_OTHER): Payer: Medicaid Other | Admitting: Nurse Practitioner

## 2020-01-26 ENCOUNTER — Other Ambulatory Visit (INDEPENDENT_AMBULATORY_CARE_PROVIDER_SITE_OTHER): Payer: Medicaid Other

## 2020-01-26 ENCOUNTER — Encounter (INDEPENDENT_AMBULATORY_CARE_PROVIDER_SITE_OTHER): Payer: Self-pay | Admitting: Nurse Practitioner

## 2020-01-26 ENCOUNTER — Ambulatory Visit (INDEPENDENT_AMBULATORY_CARE_PROVIDER_SITE_OTHER): Payer: Medicaid Other | Admitting: Nurse Practitioner

## 2020-01-26 VITALS — BP 134/80 | HR 85 | Temp 95.9°F | Ht 68.0 in | Wt 288.0 lb

## 2020-01-26 DIAGNOSIS — R5383 Other fatigue: Secondary | ICD-10-CM

## 2020-01-26 DIAGNOSIS — E039 Hypothyroidism, unspecified: Secondary | ICD-10-CM | POA: Diagnosis not present

## 2020-01-26 DIAGNOSIS — I10 Essential (primary) hypertension: Secondary | ICD-10-CM | POA: Diagnosis not present

## 2020-01-26 DIAGNOSIS — R609 Edema, unspecified: Secondary | ICD-10-CM

## 2020-01-26 DIAGNOSIS — E038 Other specified hypothyroidism: Secondary | ICD-10-CM

## 2020-01-26 NOTE — Progress Notes (Signed)
Due to national recommendations of social distancing related to the [place] pandemic, an audio-only tele-health visit was felt to be the most appropriate encounter type for this patient today. I connected with  Kristin Baker on 01/26/20 utilizing audio-only technology and verified that I am speaking with the correct person using two identifiers. The patient was located at their home, and I was located at the office of [facility] during the encounter.  She tells me her husband was present during the encounter, however he did not participate.  I discussed the limitations of evaluation and management by telemedicine. The patient expressed understanding and agreed to proceed.    Subjective:  Patient ID: Kristin Baker, female    DOB: 05-26-1991  Age: 29 y.o. MRN: [id number]  CC:  Chief Complaint  Patient presents with  . Hypertension  . Fatigue  . Other    Discuss lab work results      HPI  This patient arrives for virtual visit today for the above.  At last office visit we initiate her on hydrochlorothiazide 12.5 mg daily.  She tells me she has been taking this medication has been tolerating well, however she tells me she sometimes will forget the dose.  Initially when she started the medication she was experiencing worsening fatigue as well as some headache and dizziness feelings.  She tells me this has essentially resolved.  She also thinks that sometimes she takes a second dose because she can remember whether or not she took the medication.  She tells me she thinks this is related to her attention issues and she has been scheduled an appointment with psychiatry for further evaluation and management of hypertension concerns.  She continues to experience fatigue.  Last blood work showed normal metabolic panel, no anemia, reduced T4 but high-end normal TSH, negative for diabetes.  ANA was also collected at last office visit it did come back positive with titer 1: 80.  She does  have a past medical history of rheumatoid arthritis, but is concerned that she may also have lupus as she has a strong family history of this.  She would like to discuss her concerns with rheumatology.  She does mention to me that she is having unilateral leg swelling intermittently since 2017 following traumatic accident.  She tells me the swelling will occur when she is on her feet for any length of time as long as its longer than 15 minutes.  She is wondering what she can do to treat this.  Past Medical History:  Diagnosis Date  . ADHD   . Back pain   . DDD (degenerative disc disease), lumbar   . Hypertension   . Rheumatoid arthritis ([place])       Family History  Problem Relation Age of Onset  . Lupus Other     Social History   Social History Narrative  . Not on file   Social History   Tobacco Use  . Smoking status: Current Every Day Smoker    Packs/day: 1.00    Types: Cigarettes  . Smokeless tobacco: Never Used  Substance Use Topics  . Alcohol use: Yes    Comment: occasionally     Current Meds  Medication Sig  . levonorgestrel (MIRENA) 20 MCG/24HR IUD 1 each by Intrauterine route once.    ROS:  Review HPI  Objective:   Today's Vitals: BP 134/80 (BP Location: Left Arm, Patient Position: Sitting, Cuff Size: Normal)   Pulse 85   Temp (!) 95.9 F (  35.5 C) (Temporal)   Ht '5\' 8"'  (1.727 m)   Wt 288 lb (130.6 kg)   BMI 43.79 kg/m  Vitals with BMI 01/26/2020 01/15/2020 01/13/2020  Height '5\' 8"'  - '5\' 7"'   Weight 288 lbs - 289 lbs  BMI 95.0 - 93.26  Systolic 712 458 099  Diastolic 80 91 82  Pulse 85 81 99     Physical Exam Comprehensive physical exam not conducted today as office visit was conducted remotely.  On the phone she sounded well, she was alert oriented, she answered questions appropriately, and appeared to have appropriate judgment.      Assessment and Plan   1. Subclinical hypothyroidism   2. Hypertension, unspecified type   3. Swelling   4.  Fatigue, unspecified type      Plan: 1.  We will recollect thyroid panel may need to consider initiating on thyroid replacement.  I did discuss this with Dr. Anastasio Baker as I am concerned that her TSH is not appropriately elevated for a low T4.  For now he recommended rechecking thyroid panel before making further recommendations. 2.  She will come in today for nurses visit to have blood work collected and I will add CMP as well as have a nurse check her blood pressure to see how her blood pressures responded to hydrochlorothiazide. 3.  Recommend she start using compression stocking as needed if this does not help with the swelling may need to consider further evaluation. 4.  Will refer to rheumatology for further evaluation of her fatigue and positive ANA.   Tests ordered Orders Placed This Encounter  Procedures  . For home use only DME Other see comment  . CMP with eGFR([facility])  . TSH  . T3, Free  . T4, Free  . Ambulatory referral to Rheumatology      No orders of the defined types were placed in this encounter.   Patient to follow-up as scheduled in 2 weeks or sooner as needed.  Total time spent on telephone with this patient was approximately 11 minutes.  Kristin Tuft E Ivet Guerrieri, NP   Update: Nurse's BP reading shows improved BP on HCTZ. Patient will continue on this dose pending CMP recheck results.

## 2020-01-27 ENCOUNTER — Other Ambulatory Visit (INDEPENDENT_AMBULATORY_CARE_PROVIDER_SITE_OTHER): Payer: Self-pay | Admitting: Nurse Practitioner

## 2020-01-27 DIAGNOSIS — E038 Other specified hypothyroidism: Secondary | ICD-10-CM

## 2020-01-27 LAB — COMPLETE METABOLIC PANEL WITH GFR
AG Ratio: 1.4 (calc) (ref 1.0–2.5)
ALT: 16 U/L (ref 6–29)
AST: 14 U/L (ref 10–30)
Albumin: 4.4 g/dL (ref 3.6–5.1)
Alkaline phosphatase (APISO): 84 U/L (ref 31–125)
BUN: 7 mg/dL (ref 7–25)
CO2: 22 mmol/L (ref 20–32)
Calcium: 9.5 mg/dL (ref 8.6–10.2)
Chloride: 107 mmol/L (ref 98–110)
Creat: 0.79 mg/dL (ref 0.50–1.10)
GFR, Est African American: 117 mL/min/{1.73_m2} (ref >=60)
GFR, Est Non African American: 101 mL/min/{1.73_m2} (ref >=60)
Globulin: 3.1 g/dL (calc) (ref 1.9–3.7)
Glucose, Bld: 115 mg/dL — ABNORMAL HIGH (ref 65–99)
Potassium: 4.2 mmol/L (ref 3.5–5.3)
Sodium: 141 mmol/L (ref 135–146)
Total Bilirubin: 0.3 mg/dL (ref 0.2–1.2)
Total Protein: 7.5 g/dL (ref 6.1–8.1)

## 2020-01-27 LAB — TSH: TSH: 1.44 mIU/L

## 2020-01-27 LAB — T4, FREE: Free T4: 0.8 ng/dL (ref 0.8–1.8)

## 2020-01-27 LAB — T3, FREE: T3, Free: 1.8 pg/mL — ABNORMAL LOW (ref 2.3–4.2)

## 2020-01-27 MED ORDER — THYROID 15 MG PO TABS: 15.0000 mg | ORAL_TABLET | Freq: Every day | ORAL | 2 refills | Status: DC

## 2020-02-03 ENCOUNTER — Other Ambulatory Visit: Payer: Self-pay

## 2020-02-03 ENCOUNTER — Encounter (INDEPENDENT_AMBULATORY_CARE_PROVIDER_SITE_OTHER): Payer: Self-pay | Admitting: Internal Medicine

## 2020-02-03 ENCOUNTER — Ambulatory Visit (INDEPENDENT_AMBULATORY_CARE_PROVIDER_SITE_OTHER): Payer: Medicaid Other | Admitting: Internal Medicine

## 2020-02-03 VITALS — BP 125/95 | Temp 97.6°F | Ht 67.0 in | Wt 291.8 lb

## 2020-02-03 DIAGNOSIS — R002 Palpitations: Secondary | ICD-10-CM | POA: Diagnosis not present

## 2020-02-03 DIAGNOSIS — E038 Other specified hypothyroidism: Secondary | ICD-10-CM

## 2020-02-03 DIAGNOSIS — E039 Hypothyroidism, unspecified: Secondary | ICD-10-CM | POA: Diagnosis not present

## 2020-02-03 DIAGNOSIS — I10 Essential (primary) hypertension: Secondary | ICD-10-CM

## 2020-02-03 NOTE — Progress Notes (Signed)
Metrics: Intervention Frequency ACO  Documented Smoking Status Yearly  Screened one or more times in 24 months  Cessation Counseling or  Active cessation medication Past 24 months  Past 24 months   Guideline developer: UpToDate (See UpToDate for funding source) Date Released: 2014       Wellness Office Visit  Subjective:  Patient ID: Kristin Baker, female    DOB: 1990-09-06  Age: 29 y.o. MRN: [id number]  CC: This lady comes in because she is complaining of palpitations. HPI  She started taking desiccated NP thyroid only 3 days ago but the palpitations have been present prior to that.  She has had at least 2 episodes, each lasting a couple of minutes.  On one occasion she felt very lightheaded.  They were fast palpitations.  She felt some sort of chest tightness.  At that point, she was afraid she was having a heart attack. Past Medical History:  Diagnosis Date  . ADHD   . Back pain   . DDD (degenerative disc disease), lumbar   . Hypertension   . Rheumatoid arthritis (HCC)    History reviewed. No pertinent surgical history.   Family History  Problem Relation Age of Onset  . Lupus Other   . Hyperlipidemia Mother   . Peripheral Artery Disease Mother   . Heart disease Father     Social History   Social History Narrative   Married for May 2021.Lives with husband and daughter.Homemaker.Husband works at [employer].   Social History   Tobacco Use  . Smoking status: Current Every Day Smoker    Packs/day: 1.00    Types: Cigarettes  . Smokeless tobacco: Never Used  Substance Use Topics  . Alcohol use: Yes    Comment: occasionally    Current Meds  Medication Sig  . hydrochlorothiazide (HYDRODIURIL) 12.5 MG tablet Take 1 tablet (12.5 mg total) by mouth daily.  Marland Kitchen ibuprofen (ADVIL) 800 MG tablet Take 1 tablet (800 mg total) by mouth 3 (three) times daily.  Marland Kitchen levonorgestrel (MIRENA) 20 MCG/24HR IUD 1 each by Intrauterine route once.  . thyroid (NP THYROID) 15  MG tablet Take 1 tablet (15 mg total) by mouth daily.      Depression screen [facility] 2/9 01/11/2020  Decreased Interest 0  Down, Depressed, Hopeless 0  PHQ - 2 Score 0     Objective:   Today's Vitals: BP (!) 125/95 (BP Location: Left Arm, Patient Position: Sitting, Cuff Size: Normal)   Temp 97.6 F (36.4 C) (Temporal)   Ht 5\' 7"  (1.702 m)   Wt 291 lb 12.8 oz (132.4 kg)   BMI 45.70 kg/m  Vitals with BMI 02/03/2020 01/26/2020 01/15/2020  Height 5\' 7"  5\' 8"  -  Weight 291 lbs 13 oz 288 lbs -  BMI 45.69 43.8 -  Systolic 125 134 01/17/2020  Diastolic 95 80 91  Pulse - 85 81     Physical Exam  She looks systemically well.  She is morbidly obese.  Blood pressure elevated diastolically but previously was in a more reasonable range just 8 days ago. Cardiac examination showed a regular rhythm with no evidence of ectopic beats.     Assessment   1. Hypertension, unspecified type   2. Subclinical hypothyroidism   3. Palpitations   4. Morbid obesity (HCC)       Tests ordered Orders Placed This Encounter  Procedures  . Ambulatory referral to Cardiology     Plan: 1.  In terms of her palpitations, I will refer to  cardiology for further evaluation.  She probably does require Holter monitoring. 2.  We discussed her morbid obesity and briefly discussed nutrition.  She drinks a lot of diet sodas every day and I told her to eliminate this and drink only water.  She also puts sweeteners in her coffee which she drinks a lot of and I encouraged her to discontinue this also.  We also discussed intermittent fasting and she has been familiar with this in the past.  I told her to do 16 hours of fasting if she can every day.  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 3.  Follow-up in 6 weeks when she will need blood work regarding her thyroid function.  Today I spent 30 minutes with this patient discussing her obesity and nutrition as  well as evaluating her palpitations.   No orders of the defined types were placed in this encounter.   Wilson Singer, MD

## 2020-02-03 NOTE — Patient Instructions (Signed)
[facility] Dietary Recommendations for Weight Loss What to Avoid . Avoid added sugars o Often added sugar can be found in processed foods such as many condiments, dry cereals, cakes, cookies, chips, crisps, crackers, candies, sweetened drinks, etc.  o Read labels and AVOID/DECREASE use of foods with the following in their ingredient list: Sugar, fructose, high fructose corn syrup, sucrose, glucose, maltose, dextrose, molasses, cane sugar, brown sugar, any type of syrup, agave nectar, etc.   . Avoid snacking in between meals . Avoid foods made with flour o If you are going to eat food made with flour, choose those made with whole-grains; and, minimize your consumption as much as is tolerable . Avoid processed foods o These foods are generally stocked in the middle of the grocery store. Focus on shopping on the perimeter of the grocery.  . Avoid Meat  o We recommend following a plant-based diet at [facility]. Thus, we recommend avoiding meat as a general rule. Consider eating beans, legumes, eggs, and/or dairy products for regular protein sources o If you plan on eating meat limit to 4 ounces of meat at a time and choose lean options such as Fish, chicken, turkey. Avoid red meat intake such as pork and/or steak What to Include . Vegetables o GREEN LEAFY VEGETABLES: Kale, spinach, mustard greens, collard greens, cabbage, broccoli, etc. o OTHER: Asparagus, cauliflower, eggplant, carrots, peas, Brussel sprouts, tomatoes, bell peppers, zucchini, beets, cucumbers, etc. . Grains, seeds, and legumes o Beans: kidney beans, black eyed peas, garbanzo beans, black beans, pinto beans, etc. o Whole, unrefined grains: brown rice, barley, bulgur, oatmeal, etc. . Healthy fats  o Avoid highly processed fats such as vegetable oil o Examples of healthy fats: avocado, olives, virgin olive oil, dark chocolate (?72% Cocoa), nuts (peanuts, almonds, walnuts, cashews, pecans, etc.) . None to Low  Intake of Animal Sources of Protein o Meat sources: chicken, turkey, salmon, tuna. Limit to 4 ounces of meat at one time. o Consider limiting dairy sources, but when choosing dairy focus on: PLAIN Greek yogurt, cottage cheese, high-protein milk . Fruit o Choose berries  When to Eat . Intermittent Fasting: o Choosing not to eat for a specific time period, but DO FOCUS ON HYDRATION when fasting o Multiple Techniques: - Time Restricted Eating: eat 3 meals in a day, each meal lasting no more than 60 minutes, no snacks between meals - 16-18 hour fast: fast for 16 to 18 hours up to 7 days a week. Often suggested to start with 2-3 nonconsecutive days per week.  . Remember the time you sleep is counted as fasting.  . Examples of eating schedule: Fast from 7:00pm-11:00am. Eat between 11:00am-7:00pm.  - 24-hour fast: fast for 24 hours up to every other day. Often suggested to start with 1 day per week . Remember the time you sleep is counted as fasting . Examples of eating schedule:  o Eating day: eat 2-3 meals on your eating day. If doing 2 meals, each meal should last no more than 90 minutes. If doing 3 meals, each meal should last no more than 60 minutes. Finish last meal by 7:00pm. o Fasting day: Fast until 7:00pm.  o IF YOU FEEL UNWELL FOR ANY REASON/IN ANY WAY WHEN FASTING, STOP FASTING BY EATING A NUTRITIOUS SNACK OR LIGHT MEAL o ALWAYS FOCUS ON HYDRATION DURING FASTS - Acceptable Hydration sources: water, broths, tea/coffee (black tea/coffee is best but using a small amount of whole-fat dairy products in coffee/tea is acceptable).  -   Poor Hydration Sources: anything with sugar or artificial sweeteners added to it  These recommendations have been developed for patients that are actively receiving medical care from either Dr. Sebasthian Stailey or Sarah Gray, DNP, NP-C at [facility]. These recommendations are developed for patients with specific medical conditions and are not meant to be  distributed or used by others that are not actively receiving care from either provider listed above at [facility]. It is not appropriate to participate in the above eating plans without proper medical supervision.   Reference: Fung, J. The obesity code. [place]: [place]; 2016.   

## 2020-02-09 ENCOUNTER — Ambulatory Visit (INDEPENDENT_AMBULATORY_CARE_PROVIDER_SITE_OTHER): Payer: Medicaid Other | Admitting: Nurse Practitioner

## 2020-02-14 ENCOUNTER — Other Ambulatory Visit: Payer: Self-pay | Admitting: Orthopedic Surgery

## 2020-02-14 DIAGNOSIS — M545 Low back pain, unspecified: Secondary | ICD-10-CM

## 2020-02-16 ENCOUNTER — Telehealth (INDEPENDENT_AMBULATORY_CARE_PROVIDER_SITE_OTHER): Payer: Self-pay

## 2020-02-16 NOTE — Telephone Encounter (Signed)
Yes, it is okay to take a muscle relaxant and steroid for short-term.

## 2020-02-16 NOTE — Telephone Encounter (Signed)
Pt said thank you for help.

## 2020-03-04 ENCOUNTER — Ambulatory Visit
Admission: RE | Admit: 2020-03-04 | Discharge: 2020-03-04 | Disposition: A | Discharge status: Home / Self Care | Payer: Medicaid Other | Source: Ambulatory Visit | Attending: Orthopedic Surgery | Admitting: Orthopedic Surgery

## 2020-03-04 DIAGNOSIS — M545 Low back pain, unspecified: Secondary | ICD-10-CM

## 2020-03-04 IMAGING — MR MR LUMBAR SPINE W/O CM
4 of 5 series · 25 of 48 positions shown · non-contrast
Comparison: None.

CLINICAL DATA: Initial evaluation for chronic left lower back pain
with left lower extremity pain. Left foot numbness.

EXAM:
MRI LUMBAR SPINE WITHOUT CONTRAST
TECHNIQUE: Multiplanar, multisequence MR imaging of the lumbar spine was
performed. No intravenous contrast was administered.

[Series 3: T2 post-contrast · sagittal · 4.0mm · 0.53mm/px · 6 of 16 slices shown]
[im 1/16]
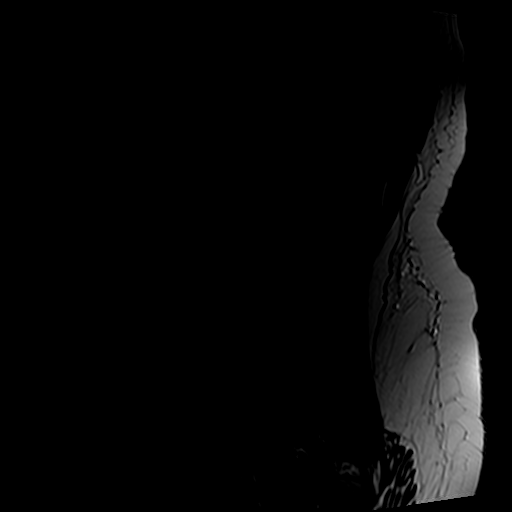
[im 4/16]
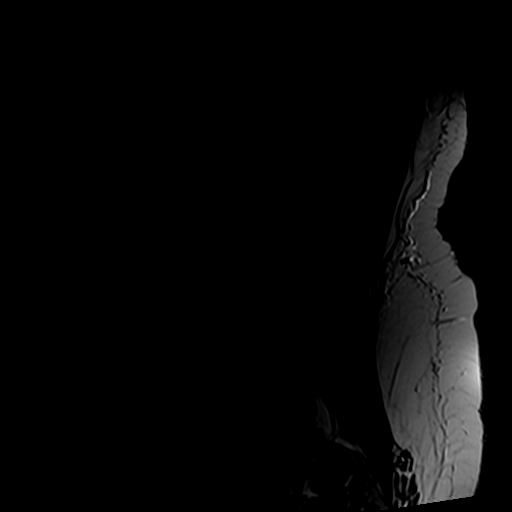
[im 7/16]
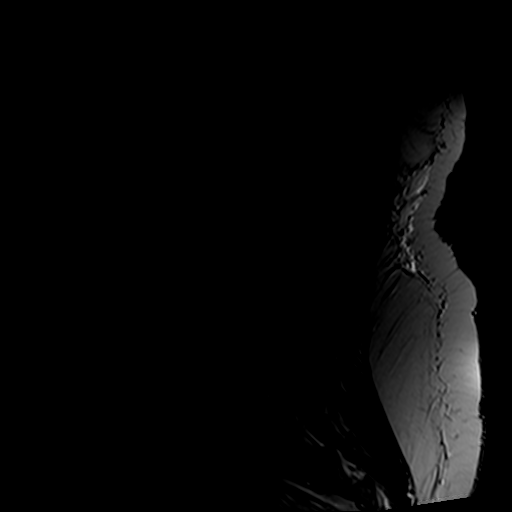
[im 10/16]
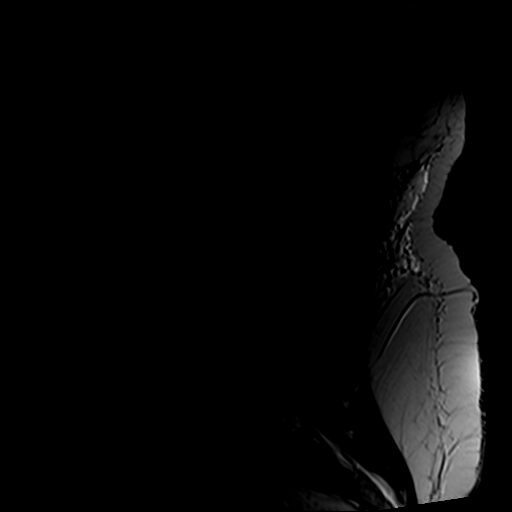
[im 13/16]
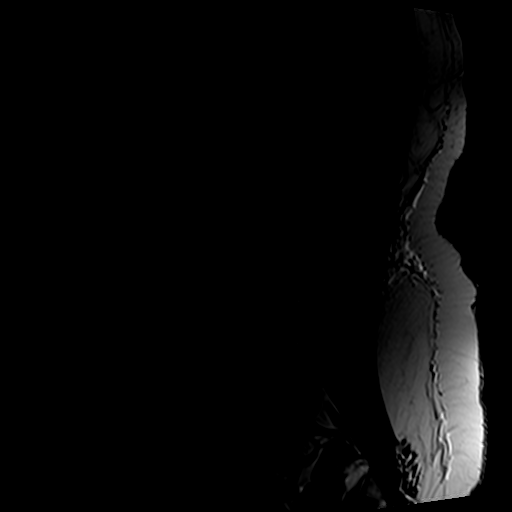
[im 16/16]
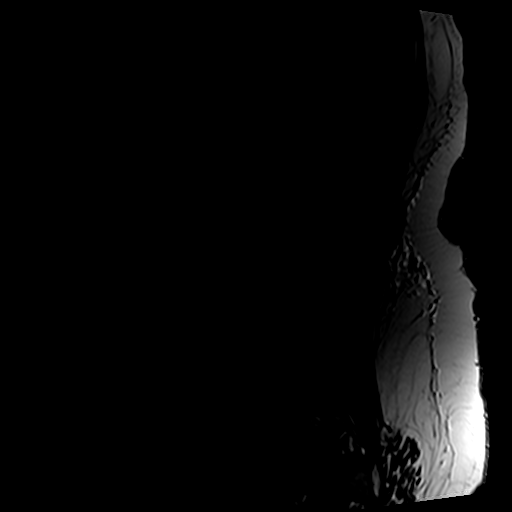

[Series 5: T1 · sagittal · 4.0mm · 0.53mm/px · 6 of 16 slices shown (1 of 2)]
[im 1/16]
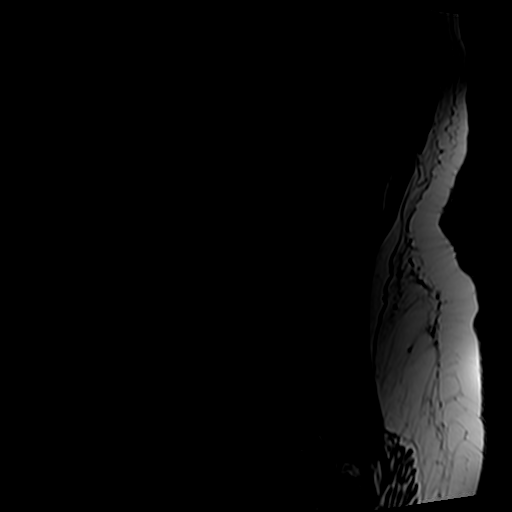
[im 4/16]
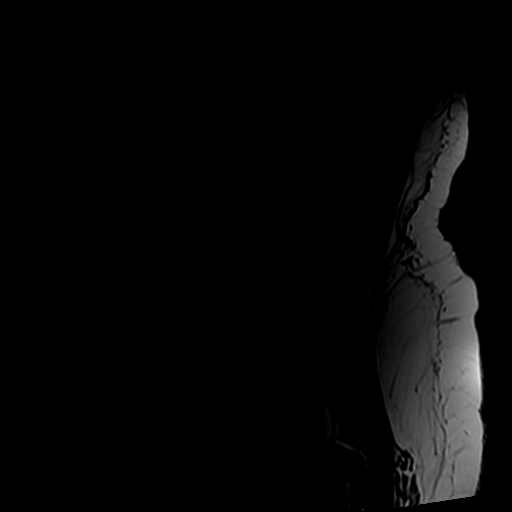
[im 7/16]
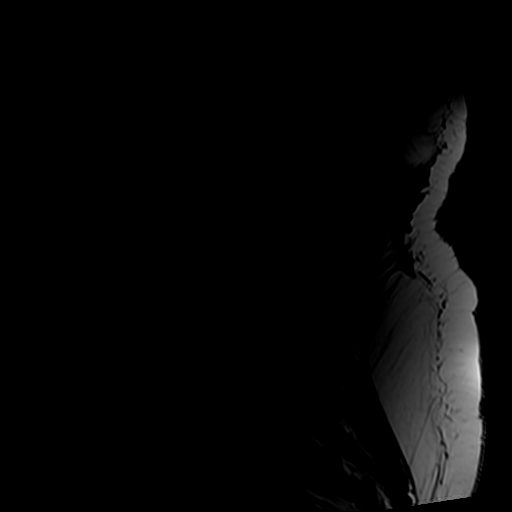
[im 10/16]
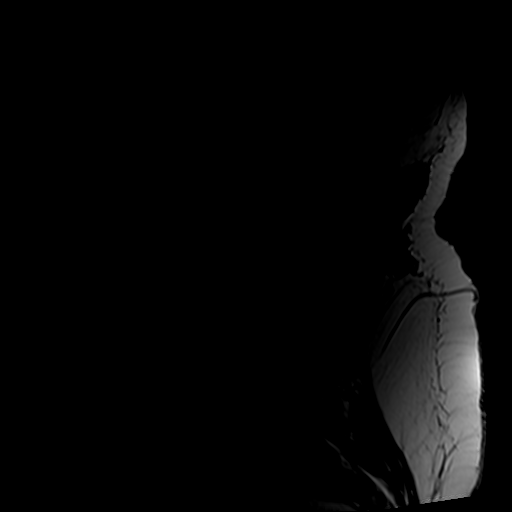
[im 13/16]
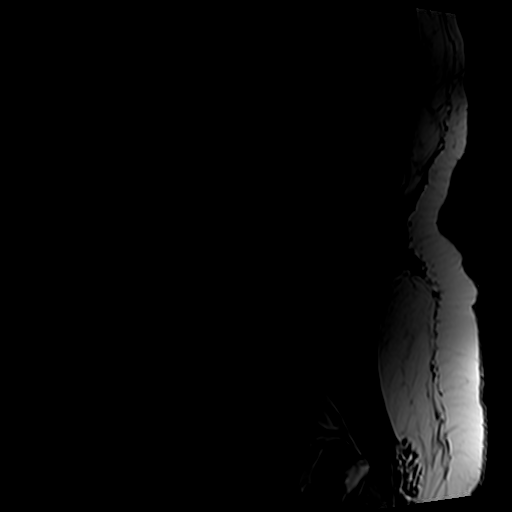
[im 16/16]
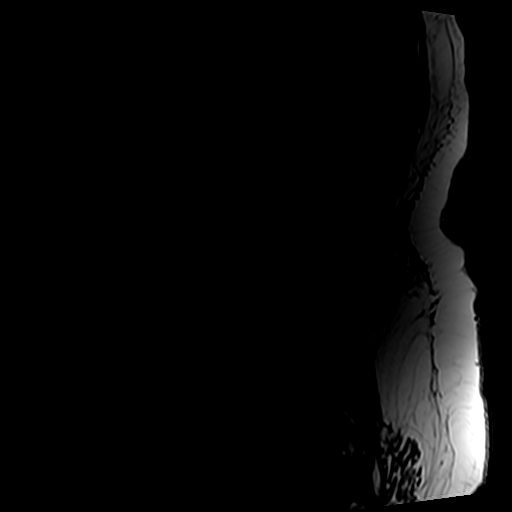

[Series 6: T2 · axial · 4.0mm · 0.70mm/px · z∈[-140,+83]mm · 9 of 40 slices shown]
[im 1/40]
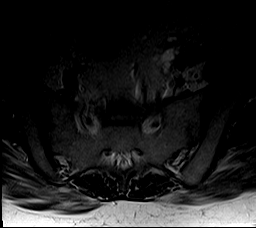
[im 6/40]
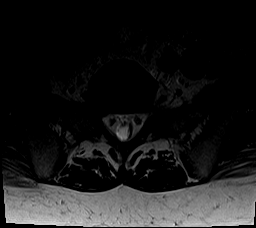
[im 12/40]
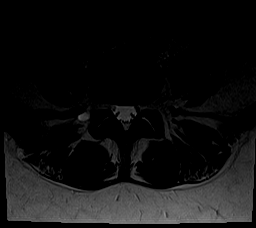
[im 17/40]
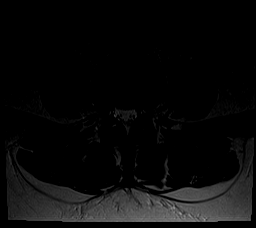
[im 20/40]
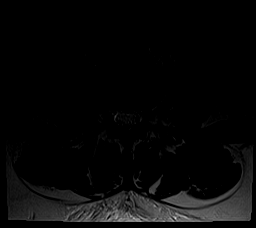
[im 23/40]
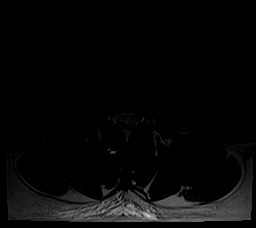
[im 28/40]
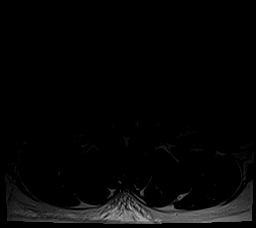
[im 34/40]
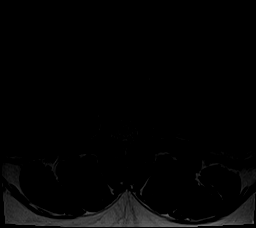
[im 40/40]
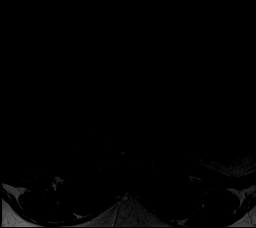

[Series 7: T1 · axial · 4.0mm · 0.35mm/px · z∈[-140,+52]mm · 4 of 40 slices shown (2 of 2)]
[im 1/40]
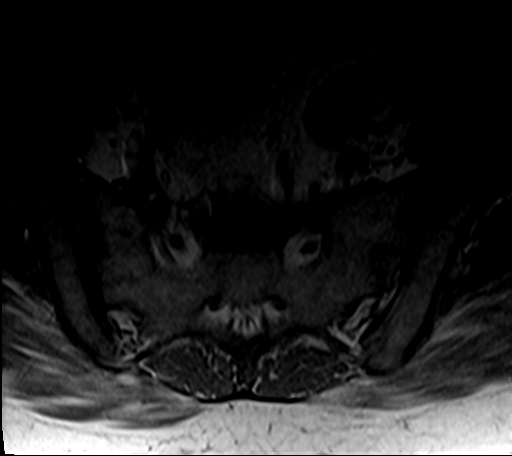
[im 6/40]
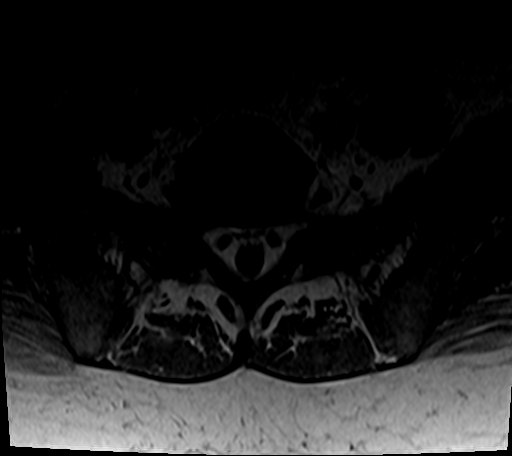
[im 20/40]
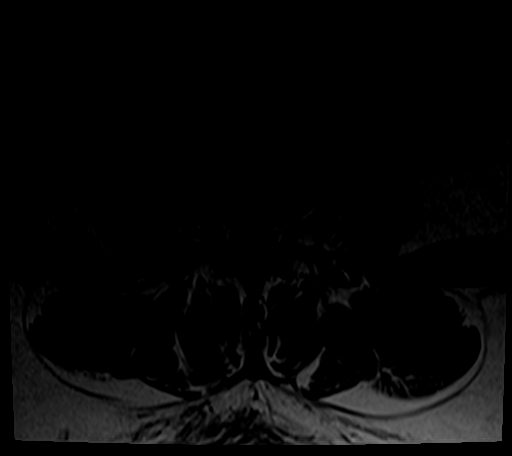
[im 34/40]
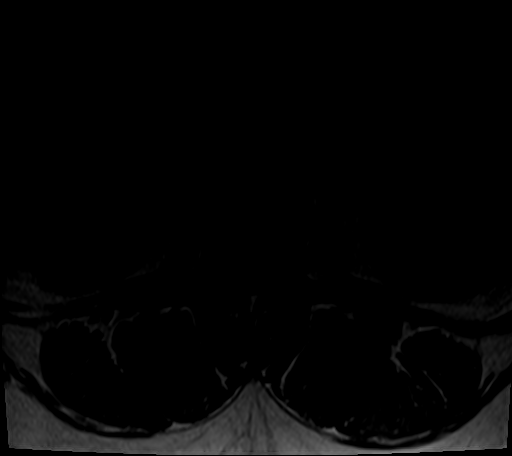

[25 of 48 positions shown; findings below may reference images not displayed]

FINDINGS: Segmentation:  Standard.

Alignment:  Physiologic.

Vertebrae: Vertebral body height maintained without acute or chronic
fracture. Bone marrow signal intensity somewhat diffusely decreased
on T1 weighted imaging, nonspecific, but most commonly related to
anemia, smoking, or obesity. No discrete or worrisome osseous
lesions. No abnormal marrow edema.

Conus medullaris and cauda equina: Conus extends to the T12-L1
level. Conus and cauda equina appear normal.

Paraspinal and other soft tissues: Unremarkable.

Disc levels:

L1-2:  Unremarkable.

L2-3: Small left foraminal to extraforaminal disc protrusion closely
approximates the exiting left L2 nerve root without impingement or
displacement (series 7, image 18). No spinal stenosis. Foramina
remain patent.

L3-4: Mild disc bulge with disc desiccation. Superimposed small
central disc protrusion minimally indents the ventral thecal sac.
Associated annular fissure. Mild facet hypertrophy. No significant
spinal stenosis. Foramina remain patent.

L4-5: Mild disc bulge with disc desiccation. Associated small
central annular fissure. Mild bilateral facet hypertrophy. No
significant spinal stenosis. Foramina remain patent.

L5-S1: Degenerative intervertebral disc space narrowing with disc
desiccation and mild disc bulge. Superimposed small central disc
protrusion with slight superior migration (series 6, image 34). Mild
facet hypertrophy with epidural lipomatosis. No significant spinal
stenosis. Foramina remain patent. No impingement.
IMPRESSION: 1. Small left foraminal to extraforaminal disc protrusion at L2-3,
closely approximating and potentially affecting the exiting left L2
nerve root.
2. Additional mild degenerative disc disease at L3-4 through L5-S1
without significant stenosis or neural impingement.
3. Mild bilateral facet hypertrophy at L3-4 through L5-S1.

## 2020-03-13 ENCOUNTER — Other Ambulatory Visit: Payer: Self-pay

## 2020-03-13 ENCOUNTER — Ambulatory Visit (INDEPENDENT_AMBULATORY_CARE_PROVIDER_SITE_OTHER): Payer: Medicaid Other | Admitting: Psychiatry

## 2020-03-13 ENCOUNTER — Encounter (HOSPITAL_COMMUNITY): Payer: Self-pay | Admitting: Psychiatry

## 2020-03-13 DIAGNOSIS — F4322 Adjustment disorder with anxiety: Secondary | ICD-10-CM | POA: Diagnosis not present
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
## 2020-03-20 ENCOUNTER — Ambulatory Visit (INDEPENDENT_AMBULATORY_CARE_PROVIDER_SITE_OTHER): Payer: Medicaid Other | Admitting: Nurse Practitioner

## 2020-03-27 ENCOUNTER — Ambulatory Visit (INDEPENDENT_AMBULATORY_CARE_PROVIDER_SITE_OTHER): Payer: Medicaid Other | Admitting: Nurse Practitioner

## 2020-04-03 ENCOUNTER — Ambulatory Visit: Payer: Medicaid Other | Admitting: Interventional Cardiology

## 2020-04-04 ENCOUNTER — Ambulatory Visit (INDEPENDENT_AMBULATORY_CARE_PROVIDER_SITE_OTHER): Payer: Medicaid Other | Admitting: Nurse Practitioner

## 2020-04-04 ENCOUNTER — Other Ambulatory Visit: Payer: Self-pay

## 2020-04-04 ENCOUNTER — Telehealth (INDEPENDENT_AMBULATORY_CARE_PROVIDER_SITE_OTHER): Payer: Self-pay

## 2020-04-04 ENCOUNTER — Encounter (INDEPENDENT_AMBULATORY_CARE_PROVIDER_SITE_OTHER): Payer: Self-pay | Admitting: Nurse Practitioner

## 2020-04-04 ENCOUNTER — Telehealth (INDEPENDENT_AMBULATORY_CARE_PROVIDER_SITE_OTHER): Payer: Self-pay | Admitting: Nurse Practitioner

## 2020-04-04 VITALS — BP 128/84 | HR 90 | Temp 97.7°F | Ht 67.0 in | Wt 289.6 lb

## 2020-04-04 DIAGNOSIS — R5383 Other fatigue: Secondary | ICD-10-CM

## 2020-04-04 DIAGNOSIS — R7989 Other specified abnormal findings of blood chemistry: Secondary | ICD-10-CM

## 2020-04-04 DIAGNOSIS — R635 Abnormal weight gain: Secondary | ICD-10-CM

## 2020-04-04 DIAGNOSIS — R519 Headache, unspecified: Secondary | ICD-10-CM

## 2020-04-04 DIAGNOSIS — I10 Essential (primary) hypertension: Secondary | ICD-10-CM

## 2020-04-04 DIAGNOSIS — G8929 Other chronic pain: Secondary | ICD-10-CM

## 2020-04-04 NOTE — Progress Notes (Signed)
Subjective:  Patient ID: Kristin Baker, female    DOB: 1991-01-15  Age: 29 y.o. MRN: [id number]  CC:  Chief Complaint  Patient presents with  . Follow-up    Ortho referred her to pain management as surgery is not an option, Dr. Jordan Likes, procedure on 04/12/2020, Lidocaine injections  . Fatigue  . Hypertension  . Headache      HPI  This patient arrives today for the above.  She has been having persistent fatigue, weight gain, as well as headaches.  She believes her weight gain may be attributed to the pain she has been experiencing her back and her difficulty with being able to participate in regular physical activity as much as she once was.  Per chart review I noticed that her thyroid hormone numbers have been off.  She has been having consistent TSH levels that are within the normal range, but both T3 and T4 have been abnormally low on separate occasions.  She is currently on a low dose of NP thyroid hormone and is tolerating this medication well.  She mention to me that her headaches have been occurring for the last 2 years, she experiences them almost daily, she also experience intermittent blurry vision.  Things that make it worse include driving and watching television.  Things that make it better seem to include resting and taking ibuprofen.  Sometimes resting ibuprofen do not improve the pain very much.  She describes the pain as sometimes "knocking" inside her head.  She also mentions to me that she has family history of an uncle and brother on her father's side that have pituitary abnormalities of some kind.  She also has a strong family history of thyroid gland disease.  As far as her hypertension goes she continues on hydrochlorothiazide without difficulty or negative side effects.  Past Medical History:  Diagnosis Date  . ADHD   . Back pain   . DDD (degenerative disc disease), lumbar   . Hypertension   . Rheumatoid arthritis (HCC)       Family History  Problem  Relation Age of Onset  . Lupus Other   . Hyperlipidemia Mother   . Peripheral Artery Disease Mother   . ADD / ADHD Mother   . Bipolar disorder Mother   . Heart disease Father   . ADD / ADHD Sister   . ADD / ADHD Maternal Aunt     Social History   Social History Narrative   Married for May 2021.Lives with husband and daughter.Homemaker.Husband works at [employer].   Social History   Tobacco Use  . Smoking status: Current Every Day Smoker    Packs/day: 1.00    Years: 15.00    Pack years: 15.00    Types: Cigarettes  . Smokeless tobacco: Never Used  . Tobacco comment: working with PCP on this  Substance Use Topics  . Alcohol use: Yes    Comment: occasionally /social events     Current Meds  Medication Sig  . hydrochlorothiazide (HYDRODIURIL) 12.5 MG tablet Take 1 tablet (12.5 mg total) by mouth daily.  Marland Kitchen levonorgestrel (MIRENA) 20 MCG/24HR IUD 1 each by Intrauterine route once.  . thyroid (NP THYROID) 15 MG tablet Take 1 tablet (15 mg total) by mouth daily.  . traMADol (ULTRAM) 50 MG tablet Take 50-100 mg by mouth every 6 (six) hours as needed.    ROS:  See HPI   Objective:   Today's Vitals: BP 128/84   Pulse 90     Subjective:  Patient ID: Kristin Baker, female    DOB: 07/08/1990  Age: 29 y.o. MRN: [id number]  CC:  Chief Complaint  Patient presents with  . Follow-up    Ortho referred her to pain management as surgery is not an option, Dr. Spivey, procedure on 04/12/2020, Lidocaine injections  . Fatigue  . Hypertension  . Headache      HPI  This patient arrives today for the above.  She has been having persistent fatigue, weight gain, as well as headaches.  She believes her weight gain may be attributed to the pain she has been experiencing her back and her difficulty with being able to participate in regular physical activity as much as she once was.  Per chart review I noticed that her thyroid hormone numbers have been off.  She has been having consistent TSH levels that are within the normal range, but both T3 and T4 have been abnormally low on separate occasions.  She is currently on a low dose of NP thyroid hormone and is tolerating this medication well.  She mention to me that her headaches have been occurring for the last 2 years, she experiences them almost daily, she also experience intermittent blurry vision.  Things that make it worse include driving and watching television.  Things that make it better seem to include resting and taking ibuprofen.  Sometimes resting ibuprofen do not improve the pain very much.  She describes the pain as sometimes "knocking" inside her head.  She also mentions to me that she has family history of an uncle and brother on her father's side that have pituitary abnormalities of some kind.  She also has a strong family history of thyroid gland disease.  As far as her hypertension goes she continues on hydrochlorothiazide without difficulty or negative side effects.  Past Medical History:  Diagnosis Date  . ADHD   . Back pain   . DDD (degenerative disc disease), lumbar   . Hypertension   . Rheumatoid arthritis (HCC)       Family History  Problem  Relation Age of Onset  . Lupus Other   . Hyperlipidemia Mother   . Peripheral Artery Disease Mother   . ADD / ADHD Mother   . Bipolar disorder Mother   . Heart disease Father   . ADD / ADHD Sister   . ADD / ADHD Maternal Aunt     Social History   Social History Narrative   Married for May 2021.Lives with husband and daughter.Homemaker.Husband works at [employer].   Social History   Tobacco Use  . Smoking status: Current Every Day Smoker    Packs/day: 1.00    Years: 15.00    Pack years: 15.00    Types: Cigarettes  . Smokeless tobacco: Never Used  . Tobacco comment: working with PCP on this  Substance Use Topics  . Alcohol use: Yes    Comment: occasionally /social events     Current Meds  Medication Sig  . hydrochlorothiazide (HYDRODIURIL) 12.5 MG tablet Take 1 tablet (12.5 mg total) by mouth daily.  . levonorgestrel (MIRENA) 20 MCG/24HR IUD 1 each by Intrauterine route once.  . thyroid (NP THYROID) 15 MG tablet Take 1 tablet (15 mg total) by mouth daily.  . traMADol (ULTRAM) 50 MG tablet Take 50-100 mg by mouth every 6 (six) hours as needed.    ROS:  See HPI   Objective:   Today's Vitals: BP 128/84   Pulse 90

## 2020-04-04 NOTE — Telephone Encounter (Signed)
I called the patient and let her know and the patient stated that it is the [facility] on [address] listed in her chart. Patient verbalized an understanding. Thank you!

## 2020-04-04 NOTE — Telephone Encounter (Signed)
Will order based on patient's thyroid panel when the results come back tomorrow.  I did not tell this to the patient because she did not mention she needed a refill during the visit, however I will be watching for her results and will send prescription if appropriate to [facility] tomorrow.  Do know if it was the [facility] on [address] or the one on [address]

## 2020-04-04 NOTE — Telephone Encounter (Signed)
MRI of brain ordered for patient. Please make sure this is run through insurance and then scheduled.

## 2020-04-04 NOTE — Telephone Encounter (Signed)
Patient was here for her visit today and she asked if her medication could be sent to [facility] and NOT [facility]. She stated that she never uses [facility] and I do not see this on her pharmacy list.  thyroid (NP THYROID) 15 MG tablet   Thank you!

## 2020-04-05 ENCOUNTER — Other Ambulatory Visit (INDEPENDENT_AMBULATORY_CARE_PROVIDER_SITE_OTHER): Payer: Self-pay | Admitting: Nurse Practitioner

## 2020-04-05 DIAGNOSIS — E038 Other specified hypothyroidism: Secondary | ICD-10-CM

## 2020-04-05 LAB — T4, FREE: Free T4: 0.7 ng/dL — ABNORMAL LOW (ref 0.8–1.8)

## 2020-04-05 LAB — COMPLETE METABOLIC PANEL WITHOUT GFR
AG Ratio: 1.5 (calc) (ref 1.0–2.5)
ALT: 14 U/L (ref 6–29)
AST: 16 U/L (ref 10–30)
Albumin: 4 g/dL (ref 3.6–5.1)
Alkaline phosphatase (APISO): 74 U/L (ref 31–125)
BUN/Creatinine Ratio: 6 (calc) (ref 6–22)
BUN: 5 mg/dL — ABNORMAL LOW (ref 7–25)
CO2: 27 mmol/L (ref 20–32)
Calcium: 9.3 mg/dL (ref 8.6–10.2)
Chloride: 108 mmol/L (ref 98–110)
Creat: 0.77 mg/dL (ref 0.50–1.10)
GFR, Est African American: 120 mL/min/{1.73_m2}
GFR, Est Non African American: 104 mL/min/{1.73_m2}
Globulin: 2.7 g/dL (ref 1.9–3.7)
Glucose, Bld: 85 mg/dL (ref 65–99)
Potassium: 4.4 mmol/L (ref 3.5–5.3)
Sodium: 142 mmol/L (ref 135–146)
Total Bilirubin: 0.2 mg/dL (ref 0.2–1.2)
Total Protein: 6.7 g/dL (ref 6.1–8.1)

## 2020-04-05 LAB — T3, FREE: T3, Free: 2.6 pg/mL (ref 2.3–4.2)

## 2020-04-05 LAB — TSH: TSH: 3.97 mIU/L

## 2020-04-05 MED ORDER — THYROID 30 MG PO TABS: 30.0000 mg | ORAL_TABLET | Freq: Every day | ORAL | 2 refills | Status: DC

## 2020-04-07 ENCOUNTER — Other Ambulatory Visit (INDEPENDENT_AMBULATORY_CARE_PROVIDER_SITE_OTHER): Payer: Self-pay | Admitting: Nurse Practitioner

## 2020-04-07 DIAGNOSIS — M5136 Other intervertebral disc degeneration, lumbar region: Secondary | ICD-10-CM

## 2020-04-07 DIAGNOSIS — F32A Depression, unspecified: Secondary | ICD-10-CM

## 2020-04-07 DIAGNOSIS — R41840 Attention and concentration deficit: Secondary | ICD-10-CM

## 2020-04-07 DIAGNOSIS — I10 Essential (primary) hypertension: Secondary | ICD-10-CM

## 2020-04-18 ENCOUNTER — Ambulatory Visit (HOSPITAL_COMMUNITY): Payer: Medicaid Other

## 2020-05-10 ENCOUNTER — Telehealth (INDEPENDENT_AMBULATORY_CARE_PROVIDER_SITE_OTHER): Payer: Self-pay | Admitting: Nurse Practitioner

## 2020-05-10 NOTE — Telephone Encounter (Signed)
Okay thank you. Saw that it wasn't scheduled when I was doing chart prep. Thank you for working on it.

## 2020-05-10 NOTE — Telephone Encounter (Signed)
Can you look into why her MRI of the brain was never scheduled, and make sure it is scheduled? I ordered it last month. Thank you.

## 2020-05-10 NOTE — Telephone Encounter (Signed)
We had work to schedule, but needed approval from insurance.Will f/u now and send you a note back.

## 2020-05-10 NOTE — Telephone Encounter (Signed)
A request for the same procedure has been approved within 60 days and service has been performed. Will call to see what happen to scheduling.

## 2020-05-16 ENCOUNTER — Ambulatory Visit (INDEPENDENT_AMBULATORY_CARE_PROVIDER_SITE_OTHER): Payer: Medicaid Other | Admitting: Nurse Practitioner

## 2020-05-16 ENCOUNTER — Other Ambulatory Visit: Payer: Self-pay

## 2020-05-16 ENCOUNTER — Encounter (INDEPENDENT_AMBULATORY_CARE_PROVIDER_SITE_OTHER): Payer: Self-pay | Admitting: Nurse Practitioner

## 2020-05-16 VITALS — BP 122/76 | HR 84 | Temp 97.7°F | Ht 67.0 in | Wt 287.6 lb

## 2020-05-16 DIAGNOSIS — E038 Other specified hypothyroidism: Secondary | ICD-10-CM

## 2020-05-16 DIAGNOSIS — J321 Chronic frontal sinusitis: Secondary | ICD-10-CM

## 2020-05-16 DIAGNOSIS — M51369 Other intervertebral disc degeneration, lumbar region without mention of lumbar back pain or lower extremity pain: Secondary | ICD-10-CM

## 2020-05-16 DIAGNOSIS — M5136 Other intervertebral disc degeneration, lumbar region: Secondary | ICD-10-CM

## 2020-05-16 DIAGNOSIS — R002 Palpitations: Secondary | ICD-10-CM

## 2020-05-16 DIAGNOSIS — M2550 Pain in unspecified joint: Secondary | ICD-10-CM

## 2020-05-16 DIAGNOSIS — R519 Headache, unspecified: Secondary | ICD-10-CM | POA: Diagnosis not present

## 2020-05-16 DIAGNOSIS — R5383 Other fatigue: Secondary | ICD-10-CM | POA: Diagnosis not present

## 2020-05-16 DIAGNOSIS — G8929 Other chronic pain: Secondary | ICD-10-CM

## 2020-05-16 MED ORDER — THYROID 15 MG PO TABS: 30.0000 mg | ORAL_TABLET | Freq: Every day | ORAL | 2 refills | Status: DC

## 2020-05-16 MED ORDER — TRAMADOL HCL 50 MG PO TABS
50.0000 mg | ORAL_TABLET | Freq: Three times a day (TID) | ORAL | 0 refills | Status: DC | PRN
Start: 2020-05-16 — End: 2020-09-12

## 2020-05-16 NOTE — Progress Notes (Signed)
Subjective:  Patient ID: Kristin Baker, female    DOB: [birth year]  Age: 29 y.o. MRN: [id number]  CC:  Chief Complaint  Patient presents with  . Follow-up    Feeling more fatigued, infected tooth on upper left tooth and filling fell out, also in more pain, going for ablation in Jan for her lumbar, Feb 14th Rheumatology appt, sharp headaches and started having hand pain      HPI  This patient arrives today for the above.  Headache: She continues to have frequent headaches.  She tells me over the last few months she is been experiencing frontal sinus pain as well as chronic nasal congestion over the last year.  She is wondering if she could see an ear nose and throat doctor for this.  She tells me she has tried over-the-counter nasal sprays as well as Benadryl without any improvement in her symptoms.  Back pain: She continues to experience back pain and is being followed by back surgeon.  She is planning on undergoing an ablation in the next few weeks.  Fatigue/Joint pain/swelling:: She continues to have significant fatigue.  She is on NP thyroid for the off label treatment of this but started having some heart palpitations when she started taking this medication.  She is planning on seeing cardiology for further evaluation, she does not complain of significant heart palpitations today.  She does have an extensive family history of autoimmune disease and is scheduled to see rheumatology in a couple of months for further evaluation of possible autoimmune disease that could be contributing to her symptoms.  She does endorse frequent and intermittent swelling of the hands and fingers.  She tells me it is quite painful at times.  She is wondering if she can have some medication to treat the pain.  She has had tramadol in the past and does take ibuprofen as needed.  She does get moderate relief from this.  She has had a positive ANA level in the past with a titer of 1: 80.   Past Medical  History:  Diagnosis Date  . ADHD   . Back pain   . DDD (degenerative disc disease), lumbar   . Hypertension   . Rheumatoid arthritis (HCC)       Family History  Problem Relation Age of Onset  . Lupus Other   . Hyperlipidemia Mother   . Peripheral Artery Disease Mother   . ADD / ADHD Mother   . Bipolar disorder Mother   . Heart disease Father   . ADD / ADHD Sister   . ADD / ADHD Maternal Aunt     Social History   Social History Narrative   Married for May 2021.Lives with husband and daughter.Homemaker.Husband works at [employer].   Social History   Tobacco Use  . Smoking status: Current Every Day Smoker    Packs/day: 1.00    Years: 15.00    Pack years: 15.00    Types: Cigarettes  . Smokeless tobacco: Never Used  . Tobacco comment: working with PCP on this  Substance Use Topics  . Alcohol use: Yes    Comment: occasionally /social events     Current Meds  Medication Sig  . b complex vitamins capsule Take 1 capsule by mouth daily.  . hydrochlorothiazide (HYDRODIURIL) 12.5 MG tablet TAKE 1 TABLET(12.5 MG) BY MOUTH DAILY  . levonorgestrel (MIRENA) 20 MCG/24HR IUD 1 each by Intrauterine route once.  . [DISCONTINUED] thyroid (NP THYROID) 30 MG tablet  Take 1 tablet (30 mg total) by mouth daily.    ROS:  Review of Systems  Constitutional: Positive for malaise/fatigue. Negative for fever.  Cardiovascular: Negative for chest pain.  Musculoskeletal: Positive for joint pain.  Neurological: Positive for headaches.     Objective:   Today's Vitals: BP 122/76   Pulse 84   Temp 97.7 F (36.5 C) (Temporal)   Ht 5\' 7"  (1.702 m)   Wt 287 lb 9.6 oz (130.5 kg)   SpO2 99%   BMI 45.04 kg/m  Vitals with BMI 05/16/2020 04/04/2020 02/03/2020  Height 5\' 7"  5\' 7"  5\' 7"   Weight 287 lbs 10 oz 289 lbs 10 oz 291 lbs 13 oz  BMI 45.03 45.35 45.69  Systolic 122 128 323  Diastolic 76 84 95  Pulse 84 90 -     Physical Exam Vitals reviewed.  Constitutional:       General: She is not in acute distress.    Appearance: Normal appearance.  HENT:     Head: Normocephalic and atraumatic.  Neck:     Vascular: No carotid bruit.  Cardiovascular:     Rate and Rhythm: Normal rate and regular rhythm.     Pulses: Normal pulses.     Heart sounds: Normal heart sounds.  Pulmonary:     Effort: Pulmonary effort is normal.     Breath sounds: Normal breath sounds.  Skin:    General: Skin is warm and dry.  Neurological:     General: No focal deficit present.     Mental Status: She is alert and oriented to person, place, and time.  Psychiatric:        Mood and Affect: Mood normal.        Behavior: Behavior normal.        Judgment: Judgment normal.          Assessment and Plan   1. Subclinical hypothyroidism   2. Fatigue, unspecified type   3. Chronic frontal sinusitis   4. Chronic intractable headache, unspecified headache type   5. Palpitations   6. Arthralgia, unspecified joint   7. DDD (degenerative disc disease), lumbar      Plan: 1.,  3-.4.  She is scheduled to undergo MRI of the brain for further evaluation of her subclinical hypothyroidism and headaches.  Will await results of MRI before making further recommendations.  I will also refer her to ENT for further evaluation and management of her frontal sinus pain. 2., 5.-6.  She continue taking her medications as prescribed and will follow up with rheumatology for further evaluation.  May consider increasing dose of her desiccated thyroid as her T4 was low at last blood check, however would prefer patient to be evaluated by cardiology for palpitations before making further dosage increases. 7.  To follow-up with her back surgeon for management of her back pain.   Tests ordered Orders Placed This Encounter  Procedures  . Ambulatory referral to ENT      Meds ordered this encounter  Medications  . thyroid (NP THYROID) 15 MG tablet    Sig: Take 2 tablets (30 mg total) by mouth daily.     Dispense:  60 tablet    Refill:  2    Order Specific Question:   Supervising Provider    Answer:   Lilly Cove C [[id number]]  . traMADol (ULTRAM) 50 MG tablet    Sig: Take 1 tablet (50 mg total) by mouth every 8 (eight) hours as needed for up to 5  days.    Dispense:  15 tablet    Refill:  0    Order Specific Question:   Supervising Provider    Answer:   Wilson Singer [[id number]]    Patient to follow-up in 6 weeks or sooner as needed.  Elenore Paddy, NP

## 2020-05-24 ENCOUNTER — Ambulatory Visit (HOSPITAL_COMMUNITY)
Admission: RE | Admit: 2020-05-24 | Discharge: 2020-05-24 | Disposition: A | Discharge status: Home / Self Care | Payer: Medicaid Other | Source: Ambulatory Visit | Attending: Nurse Practitioner | Admitting: Nurse Practitioner

## 2020-05-24 ENCOUNTER — Other Ambulatory Visit: Payer: Self-pay

## 2020-05-24 DIAGNOSIS — G8929 Other chronic pain: Secondary | ICD-10-CM | POA: Diagnosis present

## 2020-05-24 DIAGNOSIS — R7989 Other specified abnormal findings of blood chemistry: Secondary | ICD-10-CM | POA: Diagnosis present

## 2020-05-24 DIAGNOSIS — R5383 Other fatigue: Secondary | ICD-10-CM | POA: Insufficient documentation

## 2020-05-24 DIAGNOSIS — R635 Abnormal weight gain: Secondary | ICD-10-CM | POA: Diagnosis present

## 2020-05-24 DIAGNOSIS — R519 Headache, unspecified: Secondary | ICD-10-CM | POA: Insufficient documentation

## 2020-05-24 IMAGING — MR MR HEAD WO/W CM
17 of 18 series · 38 of 48 positions shown · IV contrast (gadavist)
Comparison: None.

CLINICAL DATA: Chronic headache, abnormal thyroid hormone with
concern for central hypothyroidism

EXAM:
MRI HEAD WITHOUT AND WITH CONTRAST
TECHNIQUE: Multiplanar, multiecho pulse sequences of the brain and surrounding
structures were obtained without and with intravenous contrast.
CONTRAST:  10mL GADAVIST GADOBUTROL 1 MMOL/ML IV SOLN

[Series 5: DWI · axial · 3.0mm · 0.77mm/px · z∈[-123,+18]mm · 2 of 44 slices shown (1 of 2)]
[im 1/44]
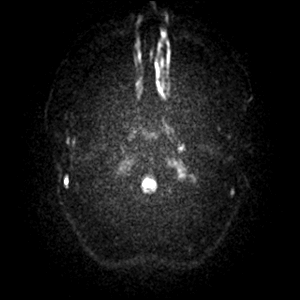
[im 44/44]
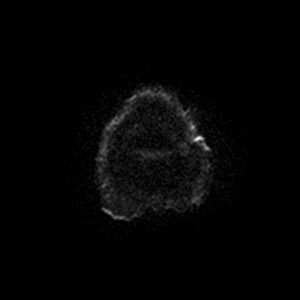

[Series 6: DWI · axial · 3.0mm · 0.77mm/px · z∈[-123,+18]mm · 2 of 44 slices shown (2 of 2)]
[im 1/44]
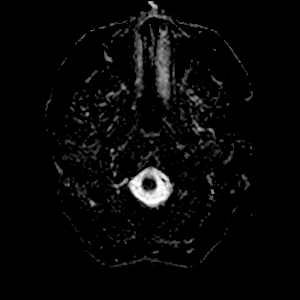
[im 44/44]
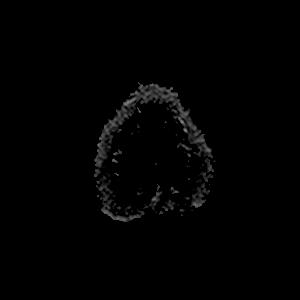

[Series 7: T1 · sagittal · 5.0mm · 0.75mm/px · 1 of 22 slices shown (1 of 4)]
[im 1/22]
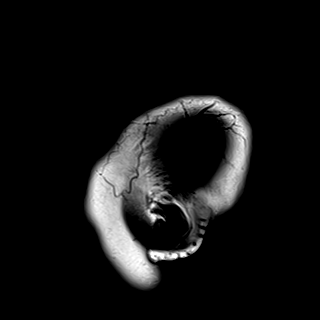

[Series 8: T2 · axial · 5.0mm · 0.72mm/px · 1 of 23 slices shown]
[im 1/23]
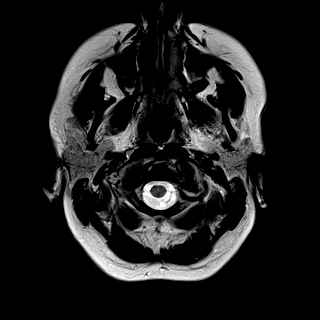

[Series 9: mag_images · axial · 3.0mm · 0.90mm/px · z∈[-133,+30]mm · 3 of 56 slices shown]
[im 1/56]
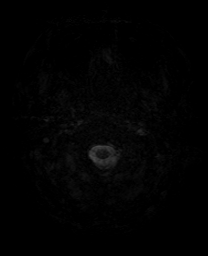
[im 28/56]
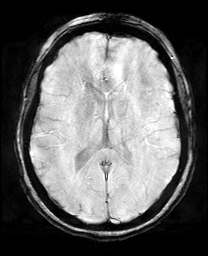
[im 56/56]
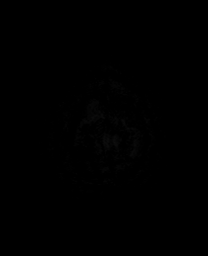

[Series 10: pha_images · axial · 3.0mm · 0.90mm/px · z∈[-133,+27]mm · 3 of 55 slices shown]
[im 1/55]
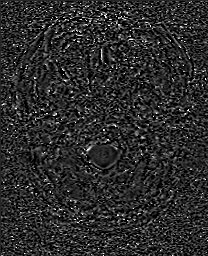
[im 28/55]
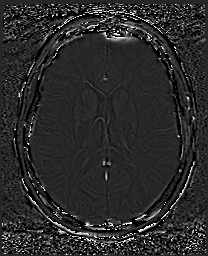
[im 55/55]
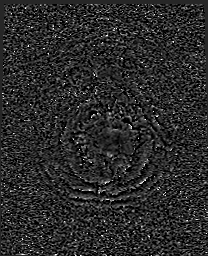

[Series 11: swi_images · axial · 3.0mm · 0.90mm/px · z∈[-133,+30]mm · 3 of 56 slices shown]
[im 1/56]
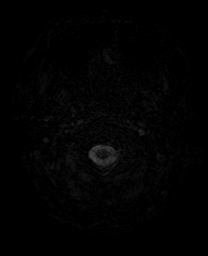
[im 28/56]
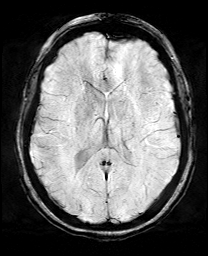
[im 56/56]
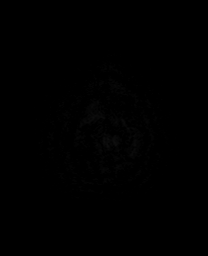

[Series 13: FLAIR · axial · 3.0mm · 0.45mm/px · z∈[-128,+24]mm · 2 of 42 slices shown]
[im 1/42]
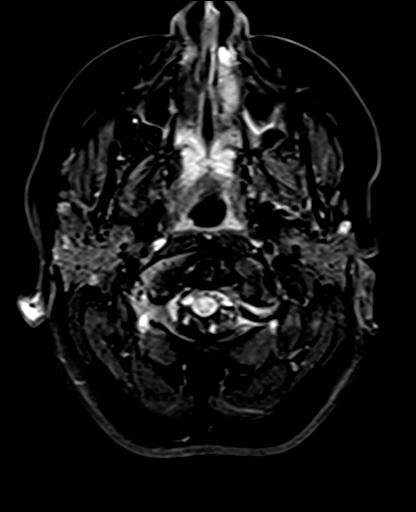
[im 42/42]
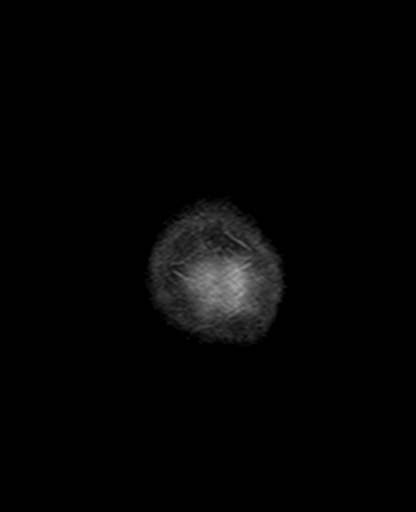

[Series 14: T1 · axial · 1.0mm · 0.98mm/px · z∈[-140,+33]mm · 10 of 176 slices shown (2 of 4)]
[im 1/176]
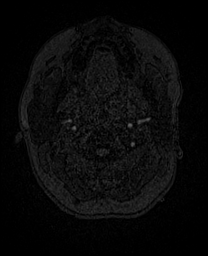
[im 20/176]
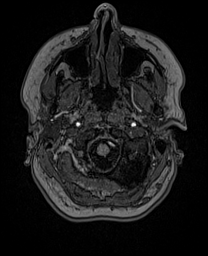
[im 39/176]
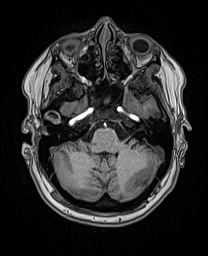
[im 59/176]
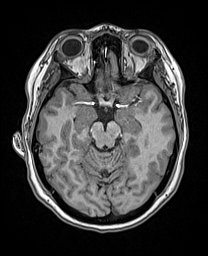
[im 78/176]
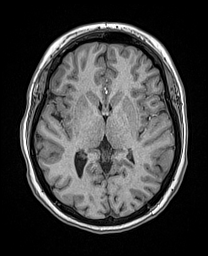
[im 98/176]
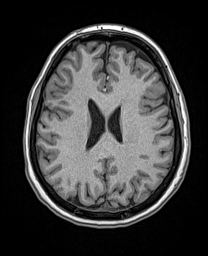
[im 117/176]
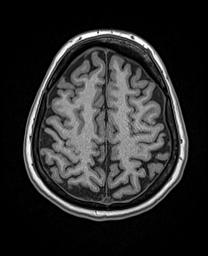
[im 137/176]
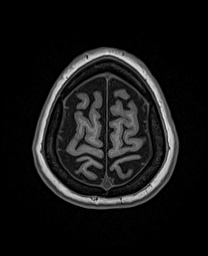
[im 156/176]
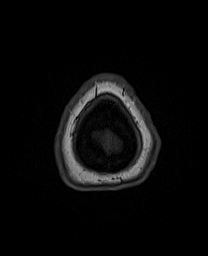
[im 176/176]
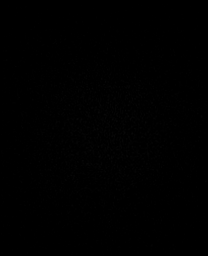

[Series 15: T1 · sagittal · 3.0mm · 0.53mm/px · 1 of 11 slices shown (3 of 4)]
[im 1/11]
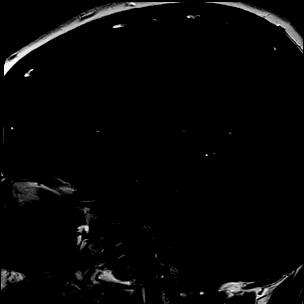

[Series 16: T1 · coronal · 3.0mm · 0.25mm/px · 1 of 11 slices shown (4 of 4)]
[im 1/11]
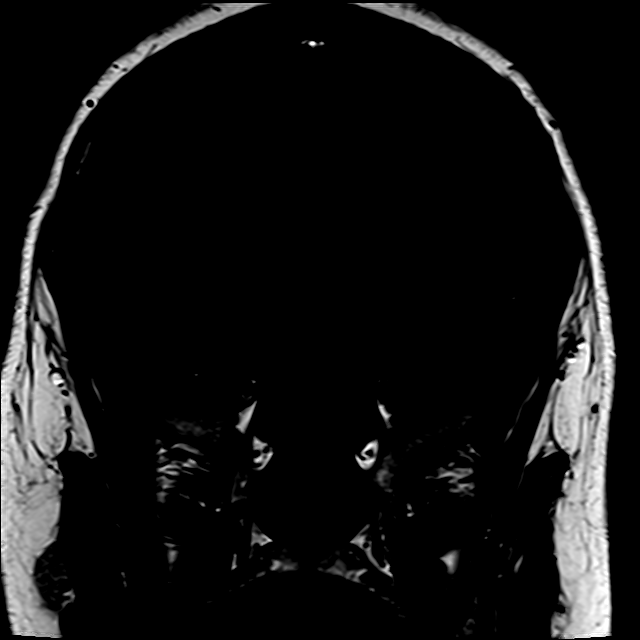

[Series 17: t1_tse_cor_dynamic pre · coronal · non-contrast · 3.0mm · 0.49mm/px · 1 of 5 slices shown]
[im 1/5]
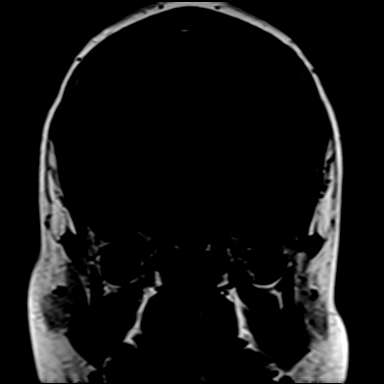

[Series 18: t1_tse_cor_dynamic post · coronal · 3.0mm · 0.49mm/px · 2 of 30 slices shown]
[im 1/30]
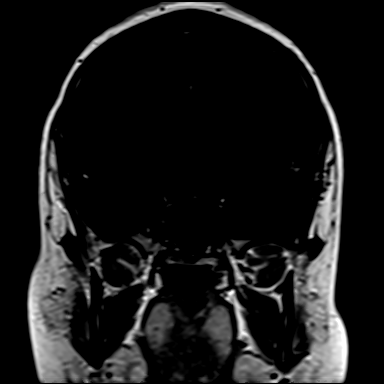
[im 30/30]
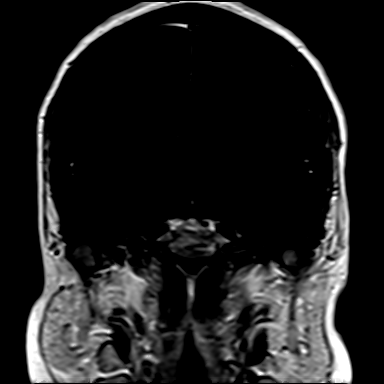

[Series 20: T2 post-contrast · coronal · 5.0mm · 0.72mm/px · 2 of 30 slices shown]
[im 1/30]
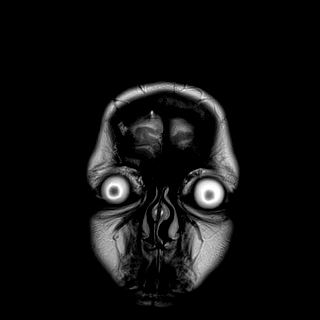
[im 30/30]
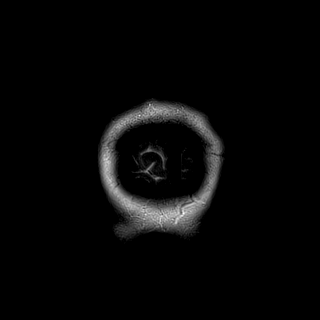

[Series 21: T1 post-contrast · sagittal · 3.0mm · 0.53mm/px · 1 of 11 slices shown (1 of 3)]
[im 1/11]
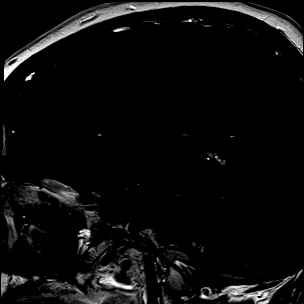

[Series 22: T1 post-contrast · coronal · 3.0mm · 0.25mm/px · 1 of 11 slices shown (2 of 3)]
[im 1/11]
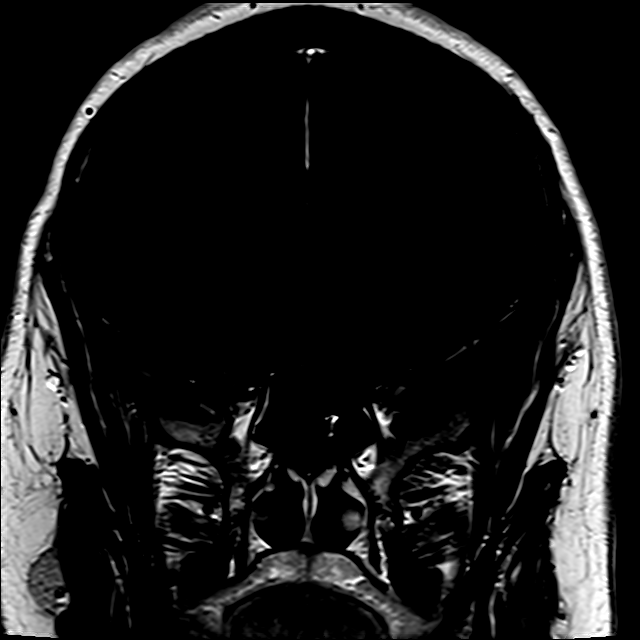

[Series 24: T1 post-contrast · coronal · 5.0mm · 0.34mm/px · 2 of 30 slices shown (3 of 3)]
[im 1/30]
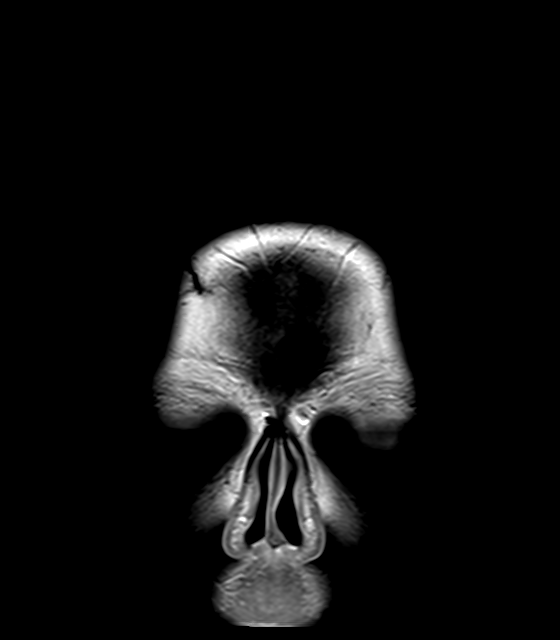
[im 30/30]
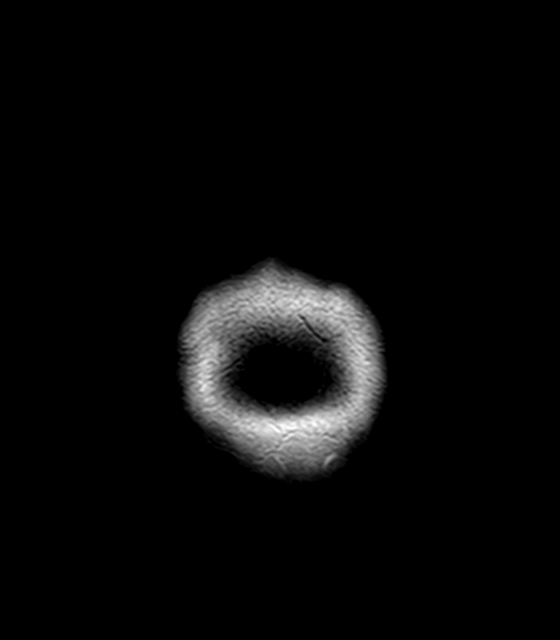

[38 of 48 positions shown; findings below may reference images not displayed]

FINDINGS: Brain: There is no sellar or suprasellar mass. Infundibulum is
normal in caliber and midline.

There is no acute infarction or intracranial hemorrhage. There is no
intracranial mass, mass effect, or edema. There is no hydrocephalus
or extra-axial fluid collection. Small focus of T2 hyperintensity in
the right periventricular white matter likely reflecting nonspecific
gliosis/demyelination. Ventricles and sulci are normal in size and
configuration. No abnormal enhancement.

Vascular: Major vessel flow voids at the skull base are preserved.

Skull and upper cervical spine: Normal marrow signal is preserved.

Sinuses/Orbits: Mild mucosal thickening.  Orbits are unremarkable.

Other: Mastoid air cells are clear. Asymmetric prominence of
visualized right cervical lymph nodes.
IMPRESSION: No sellar/suprasellar mass.

Nonspecific asymmetric prominence of visualized right cervical lymph
nodes, which may be reactive.

## 2020-05-24 MED ORDER — GADOBUTROL 1 MMOL/ML IV SOLN
10.0000 mL | Freq: Once | INTRAVENOUS | Status: AC | PRN
  Administered 2020-05-24: 10 mL via INTRAVENOUS

## 2020-06-01 ENCOUNTER — Encounter (INDEPENDENT_AMBULATORY_CARE_PROVIDER_SITE_OTHER): Payer: Self-pay | Admitting: Internal Medicine

## 2020-06-01 ENCOUNTER — Other Ambulatory Visit: Payer: Self-pay

## 2020-06-01 ENCOUNTER — Ambulatory Visit (INDEPENDENT_AMBULATORY_CARE_PROVIDER_SITE_OTHER): Payer: Medicaid Other | Admitting: Internal Medicine

## 2020-06-01 VITALS — BP 140/81 | HR 76 | Temp 96.8°F | Resp 18 | Ht 67.0 in | Wt 294.0 lb

## 2020-06-01 DIAGNOSIS — H538 Other visual disturbances: Secondary | ICD-10-CM | POA: Diagnosis not present

## 2020-06-01 DIAGNOSIS — H5711 Ocular pain, right eye: Secondary | ICD-10-CM | POA: Diagnosis not present

## 2020-06-01 NOTE — Progress Notes (Signed)
Metrics: Intervention Frequency ACO  Documented Smoking Status Yearly  Screened one or more times in 24 months  Cessation Counseling or  Active cessation medication Past 24 months  Past 24 months   Guideline developer: UpToDate (See UpToDate for funding source) Date Released: 2014       Wellness Office Visit  Subjective:  Patient ID: Kristin Baker, female    DOB: 12/29/1990  Age: 30 y.o. MRN: [id number]  CC: Painful right eye, blurred vision in the right eye. HPI  The patient comes in for an acute appointment with the above symptoms which have been present for the last 5 to 6 days. She denies any specific trauma. She denies any allergy symptoms. She denies any double vision. She did have an MRI brain scan that was resulted at the end of December last year which showed some gliosis/demyelination in the right periventricular white matter. The orbits looked unremarkable on the MRI scan. Past Medical History:  Diagnosis Date  . ADHD   . Back pain   . DDD (degenerative disc disease), lumbar   . Hypertension   . Rheumatoid arthritis (HCC)    History reviewed. No pertinent surgical history.   Family History  Problem Relation Age of Onset  . Lupus Other   . Hyperlipidemia Mother   . Peripheral Artery Disease Mother   . ADD / ADHD Mother   . Bipolar disorder Mother   . Heart disease Father   . ADD / ADHD Sister   . ADD / ADHD Maternal Aunt     Social History   Social History Narrative   Married for May 2021.Lives with husband and daughter.Homemaker.Husband works at [employer].   Social History   Tobacco Use  . Smoking status: Current Every Day Smoker    Packs/day: 1.00    Years: 15.00    Pack years: 15.00    Types: Cigarettes  . Smokeless tobacco: Never Used  . Tobacco comment: working with PCP on this  Substance Use Topics  . Alcohol use: Yes    Comment: occasionally /social events    Current Meds  Medication Sig  . b complex vitamins capsule Take  1 capsule by mouth daily.  . hydrochlorothiazide (HYDRODIURIL) 12.5 MG tablet TAKE 1 TABLET(12.5 MG) BY MOUTH DAILY  . levonorgestrel (MIRENA) 20 MCG/24HR IUD 1 each by Intrauterine route once.  . thyroid (NP THYROID) 15 MG tablet Take 2 tablets (30 mg total) by mouth daily.      Depression screen [facility] 2/9 01/11/2020  Decreased Interest 0  Down, Depressed, Hopeless 0  PHQ - 2 Score 0     Objective:   Today's Vitals: BP 140/81 (BP Location: Left Arm, Patient Position: Sitting, Cuff Size: Large)   Pulse 76   Temp (!) 96.8 F (36 C) (Temporal)   Resp 18   Ht 5\' 7"  (1.702 m)   Wt 294 lb (133.4 kg) Comment: retiaing fluids  SpO2 97%   BMI 46.05 kg/m  Vitals with BMI 06/01/2020 05/16/2020 04/04/2020  Height 5\' 7"  5\' 7"  5\' 7"   Weight 294 lbs 287 lbs 10 oz 289 lbs 10 oz  BMI 46.04 45.03 45.35  Systolic 140 122 13/01/2020  Diastolic 81 76 84  Pulse 76 84 90     Physical Exam   Pupils are equal and reactive to light on both sides. There is no clinical evidence of cataract. Unfortunately, I cannot visualize very well the optic disc/optic fundus. There is no conjunctivitis or any indication of inflammation around  the eye.    Assessment   1. Pain of right eye   2. Blurred vision, right eye       Tests ordered Orders Placed This Encounter  Procedures  . Ambulatory referral to Ophthalmology     Plan: 1. I will refer her urgently to an ophthalmologist for further evaluation. I have told the patient that if her vision gets worse, she must go to the emergency room before we can get an appointment for her.   No orders of the defined types were placed in this encounter.   Wilson Singer, MD

## 2020-06-08 NOTE — Progress Notes (Incomplete)
CARDIOLOGY CONSULT NOTE       Patient ID: Kristin Baker MRN: [id number] DOB/AGE: 03-Apr-1991 29 y.o.  Admit date: (Not on file) Referring Physician: Karilyn Cota Primary Physician: Wilson Singer, MD Primary Cardiologist: New Reason for Consultation: Palpitations  Active Problems:   * No active hospital problems. *   HPI:  30 y.o. referred by Dr Karilyn Cota for palpitations History of ADHD, HTN, RA, and low thyroid. BP Rx with diuretic On synthroid replacement with normal TSH 04/04/20 No anemia  ***  ROS All other systems reviewed and negative except as noted above  Past Medical History:  Diagnosis Date  . ADHD   . Back pain   . DDD (degenerative disc disease), lumbar   . Hypertension   . Rheumatoid arthritis (HCC)     Family History  Problem Relation Age of Onset  . Lupus Other   . Hyperlipidemia Mother   . Peripheral Artery Disease Mother   . ADD / ADHD Mother   . Bipolar disorder Mother   . Heart disease Father   . ADD / ADHD Sister   . ADD / ADHD Maternal Aunt     Social History   Socioeconomic History  . Marital status: Media planner    Spouse name: Not on file  . Number of children: Not on file  . Years of education: Not on file  . Highest education level: Not on file  Occupational History  . Not on file  Tobacco Use  . Smoking status: Current Every Day Smoker    Packs/day: 1.00    Years: 15.00    Pack years: 15.00    Types: Cigarettes  . Smokeless tobacco: Never Used  . Tobacco comment: working with PCP on this  Vaping Use  . Vaping Use: Former  Substance and Sexual Activity  . Alcohol use: Yes    Comment: occasionally /social events  . Drug use: Not Currently    Comment: smoked weed during teen years  . Sexual activity: Yes    Birth control/protection: I.U.D.  Other Topics Concern  . Not on file  Social History Narrative   Married for May 2021.Lives with husband and daughter.Homemaker.Husband works at [employer].   Social  Determinants of Health   Financial Resource Strain: Not on file  Food Insecurity: Not on file  Transportation Needs: Not on file  Physical Activity: Not on file  Stress: Not on file  Social Connections: Not on file  Intimate Partner Violence: Not on file    No past surgical history on file.    Current Outpatient Medications:  .  b complex vitamins capsule, Take 1 capsule by mouth daily., Disp: , Rfl:  .  hydrochlorothiazide (HYDRODIURIL) 12.5 MG tablet, TAKE 1 TABLET(12.5 MG) BY MOUTH DAILY, Disp: 90 tablet, Rfl: 0 .  levonorgestrel (MIRENA) 20 MCG/24HR IUD, 1 each by Intrauterine route once., Disp: , Rfl:  .  thyroid (NP THYROID) 15 MG tablet, Take 2 tablets (30 mg total) by mouth daily., Disp: 60 tablet, Rfl: 2    Physical Exam: There were no vitals taken for this visit.   Affect appropriate Healthy:  appears stated age HEENT: normal Neck supple with no adenopathy JVP normal no bruits no thyromegaly Lungs clear with no wheezing and good diaphragmatic motion Heart:  S1/S2 no murmur, no rub, gallop or click PMI normal Abdomen: benighn, BS positve, no tenderness, no AAA no bruit.  No HSM or HJR Distal pulses intact with no bruits No edema Neuro non-focal Skin warm  and dry No muscular weakness   Labs:   Lab Results  Component Value Date   WBC 9.9 01/11/2020   HGB 13.6 01/11/2020   HCT 40.2 01/11/2020   MCV 86.8 01/11/2020   PLT 383 01/11/2020   No results for input(s): NA, K, CL, CO2, BUN, CREATININE, CALCIUM, PROT, BILITOT, ALKPHOS, ALT, AST, GLUCOSE in the last 168 hours.  Invalid input(s): LABALBU No results found for: CKTOTAL, CKMB, CKMBINDEX, TROPONINI  Lab Results  Component Value Date   CHOL 223 (H) 01/11/2020   Lab Results  Component Value Date   HDL 32 (L) 01/11/2020   Lab Results  Component Value Date   LDLCALC 166 (H) 01/11/2020   Lab Results  Component Value Date   TRIG 119 01/11/2020   Lab Results  Component Value Date   CHOLHDL 7.0  (H) 01/11/2020   No results found for: LDLDIRECT    Radiology: MR Brain W Wo Contrast  Result Date: 05/24/2020 CLINICAL DATA:  Chronic headache, abnormal thyroid hormone with concern for central hypothyroidism EXAM: MRI HEAD WITHOUT AND WITH CONTRAST TECHNIQUE: Multiplanar, multiecho pulse sequences of the brain and surrounding structures were obtained without and with intravenous contrast. CONTRAST:  51mL GADAVIST GADOBUTROL 1 MMOL/ML IV SOLN COMPARISON:  None. FINDINGS: Brain: There is no sellar or suprasellar mass. Infundibulum is normal in caliber and midline. There is no acute infarction or intracranial hemorrhage. There is no intracranial mass, mass effect, or edema. There is no hydrocephalus or extra-axial fluid collection. Small focus of T2 hyperintensity in the right periventricular white matter likely reflecting nonspecific gliosis/demyelination. Ventricles and sulci are normal in size and configuration. No abnormal enhancement. Vascular: Major vessel flow voids at the skull base are preserved. Skull and upper cervical spine: Normal marrow signal is preserved. Sinuses/Orbits: Mild mucosal thickening.  Orbits are unremarkable. Other: Mastoid air cells are clear. Asymmetric prominence of visualized right cervical lymph nodes. IMPRESSION: No sellar/suprasellar mass. Nonspecific asymmetric prominence of visualized right cervical lymph nodes, which may be reactive. Electronically Signed   By: Guadlupe Spanish M.D.   On: 05/24/2020 10:30    EKG: SR rate 63 normal 2019    ASSESSMENT AND PLAN:   1. HTN;  Well controlled.  Continue current medications and low sodium Dash type diet.    2. Palpitations:  ***  3. Hypothyroidism: TSH normal continue current replacement dose   Signed: Charlton Haws 06/08/2020, 9:40 AM

## 2020-06-16 ENCOUNTER — Ambulatory Visit: Payer: Medicaid Other | Admitting: Cardiovascular Disease
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
## 2020-07-04 ENCOUNTER — Ambulatory Visit (INDEPENDENT_AMBULATORY_CARE_PROVIDER_SITE_OTHER): Payer: Medicaid Other | Admitting: Nurse Practitioner

## 2020-07-06 ENCOUNTER — Other Ambulatory Visit: Payer: Self-pay

## 2020-07-06 ENCOUNTER — Ambulatory Visit (INDEPENDENT_AMBULATORY_CARE_PROVIDER_SITE_OTHER): Payer: Medicaid Other | Admitting: Nurse Practitioner

## 2020-07-06 ENCOUNTER — Encounter (INDEPENDENT_AMBULATORY_CARE_PROVIDER_SITE_OTHER): Payer: Self-pay | Admitting: Nurse Practitioner

## 2020-07-06 VITALS — BP 134/92 | HR 92 | Temp 97.7°F | Resp 15 | Ht 67.0 in | Wt 291.0 lb

## 2020-07-06 DIAGNOSIS — R109 Unspecified abdominal pain: Secondary | ICD-10-CM | POA: Diagnosis not present

## 2020-07-06 DIAGNOSIS — R5383 Other fatigue: Secondary | ICD-10-CM | POA: Diagnosis not present

## 2020-07-06 DIAGNOSIS — I10 Essential (primary) hypertension: Secondary | ICD-10-CM

## 2020-07-06 DIAGNOSIS — R002 Palpitations: Secondary | ICD-10-CM

## 2020-07-06 DIAGNOSIS — Z30431 Encounter for routine checking of intrauterine contraceptive device: Secondary | ICD-10-CM

## 2020-07-06 DIAGNOSIS — E038 Other specified hypothyroidism: Secondary | ICD-10-CM

## 2020-07-06 DIAGNOSIS — M2550 Pain in unspecified joint: Secondary | ICD-10-CM

## 2020-07-06 NOTE — Progress Notes (Signed)
Subjective:  Patient ID: Kristin Baker, female    DOB: 1991-02-06  Age: 30 y.o. MRN: [id number]  CC:  Chief Complaint  Patient presents with  . hand swelling     Has noticed swelling in her hands and for the past 2 weeks she waves up with her face swollen and it goes away after awhile but its noticeable when she wakes up   . Abdominal Pain    Left sided abdominal pain x 2 weeks. No urinary symptoms  . Other    Palpitations, subclinical hypothyroidism  . Hypertension      HPI  This patient arrives today for the above.  Hand swelling/subclinical hypothyroidism: She continues to experience bilateral intermittent hand swelling.  She tells me she experiences swelling throughout the joints of her fingers and sometimes into her right wrist.  She also experiences redness and pain.  She has a significant family history positive for autoimmune disease and is scheduled to see rheumatology on Monday for further investigation.  She will take ibuprofen as needed but does not take Tylenol because this makes her stomach upset, which will sometimes improve her pain.  She also mention she is having a hard time gripping with her hands at times and feels that she is dropping things on a regular basis.  We did do MRI of the brain previously for investigation of her subclinical hypothyroidism and is did show "small focus of T2 hyperintensity in the right periventricular white matter likely reflecting nonspecific gliosis/demyelination".  I did reach out to neurology colleague of mine and they told me this most likely is a nonspecific finding but that we can repeat MRI in approximately 6 months with MS protocol for further evaluation monitoring.  She is due to have her thyroid hormone levels checked today she does continue on low-dose of desiccated thyroid.  Abdominal pain: She also mentions some left upper quadrant abdominal pain that started approximately 2 weeks ago.  She tells me when it occurs it will  be sharp and she rates it as a 5/10 in intensity.  She has not noticed any patterns, triggers, aggravating, or relieving factors.  She denies any visible hematuria, and reports she continues to have multiple bowel movements a day.  Palpitations: She had an episode of cardiac palpitations in the recent past and is scheduled to see cardiology for further evaluation later this month.  She tells me that she has not been experiencing as many episodes of palpitation here recently but still plans on seeing cardiology as scheduled.  Hypertension: She continues on hydrochlorothiazide.  Contraception questions: She has me that she has a Mirena IUD and this has been in place for approximately 5 years.  She would like to be evaluated by OB/GYN to determine whether or not this can stay in place or needs to be removed.  Past Medical History:  Diagnosis Date  . ADHD   . Back pain   . DDD (degenerative disc disease), lumbar   . Hypertension   . Rheumatoid arthritis ([place])       Family History  Problem Relation Age of Onset  . Lupus Other   . Hyperlipidemia Mother   . Peripheral Artery Disease Mother   . ADD / ADHD Mother   . Bipolar disorder Mother   . Heart disease Father   . ADD / ADHD Sister   . ADD / ADHD Maternal Aunt     Social History   Social History Narrative   Married for May  2021.Lives with husband and daughter.Homemaker.Husband works at [employer].   Social History   Tobacco Use  . Smoking status: Current Every Day Smoker    Packs/day: 1.00    Years: 15.00    Pack years: 15.00    Types: Cigarettes  . Smokeless tobacco: Never Used  . Tobacco comment: working with PCP on this  Substance Use Topics  . Alcohol use: Yes    Comment: occasionally /social events     Current Meds  Medication Sig  . b complex vitamins capsule Take 1 capsule by mouth daily.  . hydrochlorothiazide (HYDRODIURIL) 12.5 MG tablet TAKE 1 TABLET(12.5 MG) BY MOUTH DAILY  . levonorgestrel  (MIRENA) 20 MCG/24HR IUD 1 each by Intrauterine route once.  . thyroid (NP THYROID) 15 MG tablet Take 2 tablets (30 mg total) by mouth daily.    ROS:  Review of Systems  Constitutional: Positive for chills and diaphoresis. Negative for fever.  Respiratory: Negative for shortness of breath.   Cardiovascular: Negative for chest pain and palpitations.  Gastrointestinal: Positive for abdominal pain and nausea. Negative for blood in stool, constipation, diarrhea, melena and vomiting.  Musculoskeletal: Positive for joint pain (hand swelling).     Objective:   Today's Vitals: BP (!) 134/92   Pulse 92   Temp 97.7 F (36.5 C) (Temporal)   Resp 15   Ht '5\' 7"'  (1.702 m)   Wt 291 lb (132 kg)   SpO2 95%   BMI 45.58 kg/m  Vitals with BMI 07/06/2020 06/01/2020 05/16/2020  Height '5\' 7"'  '5\' 7"'  '5\' 7"'   Weight 291 lbs 294 lbs 287 lbs 10 oz  BMI 45.57 65.99 35.70  Systolic 177 939 030  Diastolic 92 81 76  Pulse 92 76 84     Physical Exam Vitals reviewed.  Constitutional:      General: She is not in acute distress.    Appearance: Normal appearance.  HENT:     Head: Normocephalic and atraumatic.  Neck:     Vascular: No carotid bruit.  Cardiovascular:     Rate and Rhythm: Normal rate and regular rhythm.     Pulses: Normal pulses.     Heart sounds: Normal heart sounds.  Pulmonary:     Effort: Pulmonary effort is normal.     Breath sounds: Normal breath sounds.  Abdominal:     General: Abdomen is protuberant. Bowel sounds are normal. There is no distension or abdominal bruit.     Palpations: Abdomen is soft. There is no hepatomegaly, splenomegaly or mass.     Tenderness: There is abdominal tenderness in the left upper quadrant and left lower quadrant. There is left CVA tenderness. There is no right CVA tenderness, guarding or rebound.  Musculoskeletal:     Right hand: Swelling (and redness) present.     Left hand: Swelling (and redness) present.  Skin:    General: Skin is warm and dry.   Neurological:     General: No focal deficit present.     Mental Status: She is alert and oriented to person, place, and time.  Psychiatric:        Mood and Affect: Mood normal.        Behavior: Behavior normal.        Judgment: Judgment normal.          Assessment and Plan   1. Abdominal pain, unspecified abdominal location   2. Encounter for management of intrauterine contraceptive device (IUD), unspecified IUD management type   3. Fatigue,  unspecified type   4. Subclinical hypothyroidism   5. Arthralgia, unspecified joint   6. Palpitations   7. Hypertension, unspecified type      Plan: 1.  I am concerned about possibility of kidney stone at this time.  I did not note any enlarged spleen on exam, however we will collect blood work and urine test for further evaluation.  May consider ultrasound of the left kidney pending UA and blood test results. 2.  Will refer to OB/GYN for discussion about whether or not Mirena has to be removed or for turning in place. 3.-4.  We will collect blood work for further evaluation.  For now we will hold off on reordering MRI of the brain until the 38-monthmark, however we did discuss that if rheumatology cannot find any sound etiology for her symptoms may consider reordering MRI of the brain with MS protocol vs referral to neurology sooner. 5.  She was encouraged to follow-up with rheumatology as scheduled next week. 6.  She is encouraged to follow-up with cardiology as scheduled later this month. 7.  She will continue on her hydrochlorothiazide and we will check blood work for further evaluation today.   Tests ordered Orders Placed This Encounter  Procedures  . Urinalysis with Culture Reflex  . CMP with eGFR([facility])  . CBC with Differential/Platelets  . TSH  . T3, Free  . T4, Free  . Ambulatory referral to Obstetrics / Gynecology      No orders of the defined types were placed in this encounter.   Patient to follow-up in 3 months  or sooner as needed.  SAilene Ards NP

## 2020-07-07 ENCOUNTER — Other Ambulatory Visit (INDEPENDENT_AMBULATORY_CARE_PROVIDER_SITE_OTHER): Payer: Self-pay | Admitting: Nurse Practitioner

## 2020-07-07 DIAGNOSIS — I10 Essential (primary) hypertension: Secondary | ICD-10-CM

## 2020-07-07 DIAGNOSIS — R41840 Attention and concentration deficit: Secondary | ICD-10-CM

## 2020-07-07 DIAGNOSIS — F32A Depression, unspecified: Secondary | ICD-10-CM

## 2020-07-07 DIAGNOSIS — M5136 Other intervertebral disc degeneration, lumbar region: Secondary | ICD-10-CM

## 2020-07-07 DIAGNOSIS — Z6841 Body Mass Index (BMI) 40.0 and over, adult: Secondary | ICD-10-CM

## 2020-07-08 LAB — COMPLETE METABOLIC PANEL WITH GFR
AG Ratio: 1.4 (calc) (ref 1.0–2.5)
ALT: 12 U/L (ref 6–29)
AST: 12 U/L (ref 10–30)
Albumin: 4.1 g/dL (ref 3.6–5.1)
Alkaline phosphatase (APISO): 89 U/L (ref 31–125)
BUN/Creatinine Ratio: 8 (calc) (ref 6–22)
BUN: 5 mg/dL — ABNORMAL LOW (ref 7–25)
CO2: 25 mmol/L (ref 20–32)
Calcium: 9.3 mg/dL (ref 8.6–10.2)
Chloride: 106 mmol/L (ref 98–110)
Creat: 0.64 mg/dL (ref 0.50–1.10)
GFR, Est African American: 139 mL/min/{1.73_m2} (ref >=60)
GFR, Est Non African American: 120 mL/min/{1.73_m2} (ref >=60)
Globulin: 2.9 g/dL (calc) (ref 1.9–3.7)
Glucose, Bld: 81 mg/dL (ref 65–139)
Potassium: 4.5 mmol/L (ref 3.5–5.3)
Sodium: 138 mmol/L (ref 135–146)
Total Bilirubin: 0.3 mg/dL (ref 0.2–1.2)
Total Protein: 7 g/dL (ref 6.1–8.1)

## 2020-07-08 LAB — URINALYSIS W MICROSCOPIC + REFLEX CULTURE
Bacteria, UA: NONE SEEN /HPF
Bilirubin Urine: NEGATIVE
Glucose, UA: NEGATIVE
Hgb urine dipstick: NEGATIVE
Hyaline Cast: NONE SEEN /LPF
Ketones, ur: NEGATIVE
Nitrites, Initial: NEGATIVE
Protein, ur: NEGATIVE
Specific Gravity, Urine: 1.006 (ref 1.001–1.03)
WBC, UA: NONE SEEN /HPF (ref 0–5)
pH: 7 (ref 5.0–8.0)

## 2020-07-08 LAB — URINE CULTURE: Result:: NO GROWTH

## 2020-07-08 LAB — CBC WITH DIFFERENTIAL/PLATELET
Absolute Monocytes: 601 cells/uL (ref 200–950)
Basophils Absolute: 78 cells/uL (ref 0–200)
Basophils Relative: 0.8 %
Eosinophils Absolute: 340 cells/uL (ref 15–500)
Eosinophils Relative: 3.5 %
HCT: 42.9 % (ref 35.0–45.0)
Hemoglobin: 14.4 g/dL (ref 11.7–15.5)
Lymphs Abs: 3162 cells/uL (ref 850–3900)
MCH: 29.3 pg (ref 27.0–33.0)
MCHC: 33.6 g/dL (ref 32.0–36.0)
MCV: 87.2 fL (ref 80.0–100.0)
MPV: 11 fL (ref 7.5–12.5)
Monocytes Relative: 6.2 %
Neutro Abs: 5519 cells/uL (ref 1500–7800)
Neutrophils Relative %: 56.9 %
Platelets: 428 10*3/uL — ABNORMAL HIGH (ref 140–400)
RBC: 4.92 10*6/uL (ref 3.80–5.10)
RDW: 14.2 % (ref 11.0–15.0)
Total Lymphocyte: 32.6 %
WBC: 9.7 10*3/uL (ref 3.8–10.8)

## 2020-07-08 LAB — T4, FREE: Free T4: 0.7 ng/dL — ABNORMAL LOW (ref 0.8–1.8)

## 2020-07-08 LAB — T3, FREE: T3, Free: 2.4 pg/mL (ref 2.3–4.2)

## 2020-07-08 LAB — CULTURE INDICATED

## 2020-07-08 LAB — TSH: TSH: 5.57 mIU/L — ABNORMAL HIGH

## 2020-07-10 ENCOUNTER — Ambulatory Visit: Payer: Self-pay

## 2020-07-10 ENCOUNTER — Ambulatory Visit: Payer: Medicaid Other | Admitting: Rheumatology

## 2020-07-10 ENCOUNTER — Other Ambulatory Visit: Payer: Self-pay

## 2020-07-10 ENCOUNTER — Telehealth (INDEPENDENT_AMBULATORY_CARE_PROVIDER_SITE_OTHER): Payer: Self-pay | Admitting: Nurse Practitioner

## 2020-07-10 ENCOUNTER — Encounter: Payer: Self-pay | Admitting: Rheumatology

## 2020-07-10 VITALS — BP 132/94 | HR 98 | Resp 17 | Ht 67.0 in | Wt 293.4 lb

## 2020-07-10 DIAGNOSIS — I10 Essential (primary) hypertension: Secondary | ICD-10-CM

## 2020-07-10 DIAGNOSIS — M79641 Pain in right hand: Secondary | ICD-10-CM | POA: Diagnosis not present

## 2020-07-10 DIAGNOSIS — E038 Other specified hypothyroidism: Secondary | ICD-10-CM | POA: Insufficient documentation

## 2020-07-10 DIAGNOSIS — M25562 Pain in left knee: Secondary | ICD-10-CM | POA: Diagnosis not present

## 2020-07-10 DIAGNOSIS — M79671 Pain in right foot: Secondary | ICD-10-CM

## 2020-07-10 DIAGNOSIS — M5136 Other intervertebral disc degeneration, lumbar region: Secondary | ICD-10-CM

## 2020-07-10 DIAGNOSIS — E039 Hypothyroidism, unspecified: Secondary | ICD-10-CM

## 2020-07-10 DIAGNOSIS — R002 Palpitations: Secondary | ICD-10-CM

## 2020-07-10 DIAGNOSIS — M51369 Other intervertebral disc degeneration, lumbar region without mention of lumbar back pain or lower extremity pain: Secondary | ICD-10-CM

## 2020-07-10 DIAGNOSIS — K529 Noninfective gastroenteritis and colitis, unspecified: Secondary | ICD-10-CM

## 2020-07-10 DIAGNOSIS — H5711 Ocular pain, right eye: Secondary | ICD-10-CM

## 2020-07-10 DIAGNOSIS — R768 Other specified abnormal immunological findings in serum: Secondary | ICD-10-CM | POA: Diagnosis not present

## 2020-07-10 DIAGNOSIS — H04123 Dry eye syndrome of bilateral lacrimal glands: Secondary | ICD-10-CM | POA: Diagnosis not present

## 2020-07-10 DIAGNOSIS — Z8269 Family history of other diseases of the musculoskeletal system and connective tissue: Secondary | ICD-10-CM

## 2020-07-10 DIAGNOSIS — Z8261 Family history of arthritis: Secondary | ICD-10-CM

## 2020-07-10 DIAGNOSIS — M79642 Pain in left hand: Secondary | ICD-10-CM | POA: Diagnosis not present

## 2020-07-10 DIAGNOSIS — M79672 Pain in left foot: Secondary | ICD-10-CM | POA: Diagnosis not present

## 2020-07-10 DIAGNOSIS — H538 Other visual disturbances: Secondary | ICD-10-CM

## 2020-07-10 DIAGNOSIS — G8929 Other chronic pain: Secondary | ICD-10-CM

## 2020-07-10 DIAGNOSIS — R5383 Other fatigue: Secondary | ICD-10-CM

## 2020-07-10 DIAGNOSIS — F17200 Nicotine dependence, unspecified, uncomplicated: Secondary | ICD-10-CM

## 2020-07-10 DIAGNOSIS — Z820 Family history of epilepsy and other diseases of the nervous system: Secondary | ICD-10-CM

## 2020-07-10 NOTE — Telephone Encounter (Signed)
Kristin Baker, please call this patient and schedule her for a follow-up appointment with myself within the next 2 weeks. Can be telephone or in person (whichever the patient prefers). This is to discuss her blood work and urine results. There is nothing worrisome, but we may need to make some changes to her medications. If she has any specific questions, please let me know and I will see if I can get support staff to call her back with specific recommendations. Thank you.

## 2020-07-10 NOTE — Telephone Encounter (Signed)
Called patient and made an appointment for next [facility]

## 2020-07-19 ENCOUNTER — Encounter: Payer: Self-pay | Admitting: Rheumatology

## 2020-07-20 ENCOUNTER — Telehealth (INDEPENDENT_AMBULATORY_CARE_PROVIDER_SITE_OTHER): Payer: Self-pay | Admitting: Nurse Practitioner

## 2020-07-20 ENCOUNTER — Ambulatory Visit (INDEPENDENT_AMBULATORY_CARE_PROVIDER_SITE_OTHER): Payer: Medicaid Other | Admitting: Nurse Practitioner

## 2020-07-20 ENCOUNTER — Other Ambulatory Visit: Payer: Self-pay

## 2020-07-20 ENCOUNTER — Encounter (INDEPENDENT_AMBULATORY_CARE_PROVIDER_SITE_OTHER): Payer: Self-pay | Admitting: Nurse Practitioner

## 2020-07-20 VITALS — BP 126/76 | HR 73 | Temp 97.8°F | Ht 67.0 in | Wt 291.6 lb

## 2020-07-20 DIAGNOSIS — R1012 Left upper quadrant pain: Secondary | ICD-10-CM

## 2020-07-20 DIAGNOSIS — R8281 Pyuria: Secondary | ICD-10-CM

## 2020-07-20 DIAGNOSIS — Z6841 Body Mass Index (BMI) 40.0 and over, adult: Secondary | ICD-10-CM

## 2020-07-20 DIAGNOSIS — E039 Hypothyroidism, unspecified: Secondary | ICD-10-CM

## 2020-07-20 LAB — POCT URINE PREGNANCY: Preg Test, Ur: NEGATIVE

## 2020-07-20 MED ORDER — THYROID 60 MG PO TABS
60.0000 mg | ORAL_TABLET | Freq: Every day | ORAL | 0 refills | Status: DC
Start: 2020-07-20 — End: 2020-09-05

## 2020-07-20 NOTE — Patient Instructions (Signed)
Liraglutide injection (Weight Management) What is this medicine? LIRAGLUTIDE (LIR a GLOO tide) is used to help people lose weight and maintain weight loss. It is used with a reduced-calorie diet and exercise. This medicine may be used for other purposes; ask your health care provider or pharmacist if you have questions. COMMON BRAND NAME(S): Saxenda What should I tell my health care provider before I take this medicine? They need to know if you have any of these conditions:  endocrine tumors (MEN 2) or if someone in your family had these tumors  gallbladder disease  high cholesterol  history of alcohol abuse problem  history of pancreatitis  kidney disease or if you are on dialysis  liver disease  previous swelling of the tongue, face, or lips with difficulty breathing, difficulty swallowing, hoarseness, or tightening of the throat  stomach problems  suicidal thoughts, plans, or attempt; a previous suicide attempt by you or a family member  thyroid cancer or if someone in your family had thyroid cancer  an unusual or allergic reaction to liraglutide, other medicines, foods, dyes, or preservatives  pregnant or trying to get pregnant  breast-feeding How should I use this medicine? This medicine is for injection under the skin of your upper leg, stomach area, or upper arm. You will be taught how to prepare and give this medicine. Use exactly as directed. Take your medicine at regular intervals. Do not take it more often than directed. This medicine comes with INSTRUCTIONS FOR USE. Ask your pharmacist for directions on how to use this medicine. Read the information carefully. Talk to your pharmacist or health care provider if you have questions. It is important that you put your used needles and syringes in a special sharps container. Do not put them in a trash can. If you do not have a sharps container, call your pharmacist or healthcare provider to get one. A special MedGuide  will be given to you by the pharmacist with each prescription and refill. Be sure to read this information carefully each time. Talk to your health care provider about the use of this medicine in children. While it may be prescribed for children as young as 12 years of age for selected conditions, precautions do apply. Overdosage: If you think you have taken too much of this medicine contact a poison control center or emergency room at once. NOTE: This medicine is only for you. Do not share this medicine with others. What if I miss a dose? If you miss a dose, take it as soon as you can. If it is almost time for your next dose, take only that dose. Do not take double or extra doses. If you miss your dose for 3 days or more, call your doctor or health care professional to talk about how to restart this medicine. What may interact with this medicine?  insulin and other medicines for diabetes This list may not describe all possible interactions. Give your health care provider a list of all the medicines, herbs, non-prescription drugs, or dietary supplements you use. Also tell them if you smoke, drink alcohol, or use illegal drugs. Some items may interact with your medicine. What should I watch for while using this medicine? Visit your doctor or health care professional for regular checks on your progress. Drink plenty of fluids while taking this medicine. Check with your doctor or health care professional if you get an attack of severe diarrhea, nausea, and vomiting. The loss of too much body fluid can make it   dangerous for you to take this medicine. This medicine may affect blood sugar levels. Ask your healthcare provider if changes in diet or medicines are needed if you have diabetes. Patients and their families should watch out for worsening depression or thoughts of suicide. Also watch out for sudden changes in feelings such as feeling anxious, agitated, panicky, irritable, hostile, aggressive,  impulsive, severely restless, overly excited and hyperactive, or not being able to sleep. If this happens, especially at the beginning of treatment or after a change in dose, call your health care professional. Women should inform their health care provider if they wish to become pregnant or think they might be pregnant. Losing weight while pregnant is not advised and may cause harm to the unborn child. Talk to your health care provider for more information. What side effects may I notice from receiving this medicine? Side effects that you should report to your doctor or health care professional as soon as possible:  allergic reactions like skin rash, itching or hives, swelling of the face, lips, or tongue  breathing problems  diarrhea that continues or is severe  lump or swelling on the neck  severe nausea  signs and symptoms of infection like fever or chills; cough; sore throat; pain or trouble passing urine  signs and symptoms of low blood sugar such as feeling anxious; confusion; dizziness; increased hunger; unusually weak or tired; increased sweating; shakiness; cold, clammy skin; irritable; headache; blurred vision; fast heartbeat; loss of consciousness  signs and symptoms of kidney injury like trouble passing urine or change in the amount of urine  trouble swallowing  unusual stomach upset or pain  vomiting Side effects that usually do not require medical attention (report to your doctor or health care professional if they continue or are bothersome):  constipation  decreased appetite  diarrhea  fatigue  headache  nausea  pain, redness, or irritation at site where injected  stomach upset  stuffy or runny nose This list may not describe all possible side effects. Call your doctor for medical advice about side effects. You may report side effects to FDA at 1-800-FDA-1088. Where should I keep my medicine? Keep out of the reach of children. Store unopened pen in a  refrigerator between 2 and 8 degrees C (36 and 46 degrees F). Do not freeze or use if the medicine has been frozen. Protect from light and excessive heat. After you first use the pen, it can be stored at room temperature between 15 and 30 degrees C (59 and 86 degrees F) or in a refrigerator. Throw away your used pen after 30 days or after the expiration date, whichever comes first. Do not store your pen with the needle attached. If the needle is left on, medicine may leak from the pen. NOTE: This sheet is a summary. It may not cover all possible information. If you have questions about this medicine, talk to your doctor, pharmacist, or health care provider.  2021 Elsevier/Gold Standard (2019-05-06 13:56:25)  

## 2020-07-20 NOTE — Progress Notes (Signed)
Subjective:  Patient ID: Kristin Baker, female    DOB: 08/12/1990  Age: 30 y.o. MRN: [id number]  CC:  Chief Complaint  Patient presents with  . Follow-up    Discuss lab results, still having pain on left side  . Abdominal Pain  . Hypothyroidism  . Obesity      HPI  This patient arrives today for the above.  Abdominal pain: She started having left upper quadrant abdominal pain that started approximately 1 month ago.  It is often sharp and she rates it as a 5/10 in intensity.  At her last office visit we checked urine which was clear except for some pyuria however nothing grew on culture.  Metabolic panel was normal she didn't have any anemia but did have mildly elevated platelet levels.  Since that visit she tells me that the pain seems to be getting progressively worse in certain positions seem to hurt the pain for example driving can cause worsening pain and sometimes the pain will be so severe that it wakes her up at night. She denies any visible hematuria, and reports she continues to have multiple bowel movements a day.  Hypothyroidism: As per her lab work at last office visit thyroid panel was checked and it did show elevated TSH with a low T4.  She is on NP thyroid 30 mg daily currently.  She is tolerating this medication well but does report intermittent palpitations.  She is scheduled to see cardiology tomorrow for further evaluation of this.  She reports 1 episode of cardiac palpitations that occurred couple days ago but this ended up resolving spontaneously.  She does report some shortness of breath during the episodes.  Obesity: She is concerned regarding her weight and would like to discuss options to help her with weight loss.  Past Medical History:  Diagnosis Date  . ADHD   . Back pain   . DDD (degenerative disc disease), lumbar   . Hypertension   . Rheumatoid arthritis (HCC)       Family History  Problem Relation Age of Onset  . Hyperlipidemia Mother   .  Peripheral Artery Disease Mother   . ADD / ADHD Mother   . Bipolar disorder Mother   . Cervical cancer Mother   . Diabetes Father   . Heart attack Father   . ADD / ADHD Sister   . Healthy Daughter   . ADD / ADHD Maternal Aunt     Social History   Social History Narrative   Married for May 2021.Lives with husband and daughter.Homemaker.Husband works at [employer].   Social History   Tobacco Use  . Smoking status: Current Every Day Smoker    Packs/day: 1.00    Years: 15.00    Pack years: 15.00    Types: Cigarettes  . Smokeless tobacco: Never Used  . Tobacco comment: working with PCP on this  Substance Use Topics  . Alcohol use: Yes    Comment: occasionally /social events     Current Meds  Medication Sig  . b complex vitamins capsule Take 1 capsule by mouth daily.  . hydrochlorothiazide (HYDRODIURIL) 12.5 MG tablet TAKE 1 TABLET(12.5 MG) BY MOUTH DAILY  . levonorgestrel (MIRENA) 20 MCG/24HR IUD 1 each by Intrauterine route once.  . [DISCONTINUED] thyroid (NP THYROID) 15 MG tablet Take 2 tablets (30 mg total) by mouth daily.    ROS:  See HPI  Objective:   Today's Vitals: BP 126/76   Pulse 73  Temp 97.8 F (36.6 C) (Temporal)   Ht 5\' 7"  (1.702 m)   Wt 291 lb 9.6 oz (132.3 kg)   SpO2 98%   BMI 45.67 kg/m  Vitals with BMI 07/20/2020 07/10/2020 07/10/2020  Height 5\' 7"  - 5\' 7"   Weight 291 lbs 10 oz - 293 lbs 6 oz  BMI 45.66 - 45.94  Systolic 126 132 07/12/2020  Diastolic 76 94 76  Pulse 73 98 81     Physical Exam Vitals reviewed.  Constitutional:      General: She is not in acute distress.    Appearance: Normal appearance.  HENT:     Head: Normocephalic and atraumatic.  Neck:     Vascular: No carotid bruit.  Cardiovascular:     Rate and Rhythm: Normal rate and regular rhythm.     Pulses: Normal pulses.     Heart sounds: Normal heart sounds.  Pulmonary:     Effort: Pulmonary effort is normal.     Breath sounds: Normal breath sounds.  Abdominal:      General: Abdomen is flat. Bowel sounds are normal. There is no distension.     Palpations: Abdomen is soft. There is no splenomegaly or mass.     Tenderness: There is abdominal tenderness in the left upper quadrant.     Hernia: No hernia is present.  Skin:    General: Skin is warm and dry.  Neurological:     General: No focal deficit present.     Mental Status: She is alert and oriented to person, place, and time.  Psychiatric:        Mood and Affect: Mood normal.        Behavior: Behavior normal.        Judgment: Judgment normal.          Assessment and Plan   1. Hypothyroidism, unspecified type   2. Left upper quadrant abdominal pain   3. Sterile pyuria   4. Class 3 severe obesity with body mass index (BMI) of 45.0 to 49.9 in adult, unspecified obesity type, unspecified whether serious comorbidity present (HCC)      Plan: 1.  We will increase her NP thyroid 60 mg daily.  I did tell her to be mindful of possible increased palpitations and if this occurs and may need to consider changing to levothyroxine.  She tells me she'll let me know and she will follow up with cardiology tomorrow regarding her palpitations. 2., 3.  At this point I do think it would be worth getting imaging for further evaluation of her pain so I will order CT scan of abdomen with contrast for further evaluation.  We'll also evaluate her sterile pyuria further and will check urine immunofixation. 4.  I will refer her to a dietitian for assistance with helping with weight loss and she will discuss this further when she sees Dr. for follow-up in approximately 6 weeks.   Tests ordered Orders Placed This Encounter  Procedures  . CT ABDOMEN W CONTRAST  . Immunofixation interpretive, urine  . Amb ref to Medical Nutrition Therapy-MNT  . POCT urine pregnancy      Meds ordered this encounter  Medications  . thyroid (NP THYROID) 60 MG tablet    Sig: Take 1 tablet (60 mg total) by mouth daily.     Dispense:  90 tablet    Refill:  0    Order Specific Question:   Supervising Provider    Answer:   [[id number]]  Patient to follow-up in 6 weeks or sooner as needed.  Ailene Ards, NP

## 2020-07-20 NOTE — Telephone Encounter (Signed)
I ordered CT scan of abdomen ordered with contrast today. Please work on getting this approved and scheduled. Thank you

## 2020-07-21 ENCOUNTER — Encounter: Payer: Self-pay | Admitting: Cardiology

## 2020-07-21 ENCOUNTER — Encounter: Payer: Self-pay | Admitting: Radiology

## 2020-07-21 ENCOUNTER — Ambulatory Visit: Payer: Medicaid Other | Admitting: Cardiology

## 2020-07-21 VITALS — BP 128/80 | HR 95 | Ht 67.0 in | Wt 290.0 lb

## 2020-07-21 DIAGNOSIS — R9431 Abnormal electrocardiogram [ECG] [EKG]: Secondary | ICD-10-CM | POA: Diagnosis not present

## 2020-07-21 DIAGNOSIS — R002 Palpitations: Secondary | ICD-10-CM

## 2020-07-21 DIAGNOSIS — Z8249 Family history of ischemic heart disease and other diseases of the circulatory system: Secondary | ICD-10-CM

## 2020-07-21 DIAGNOSIS — I10 Essential (primary) hypertension: Secondary | ICD-10-CM

## 2020-07-21 NOTE — Progress Notes (Signed)
Enrolled patient for a 30 day [employer] Event Monitor to be mailed to patients home  

## 2020-07-21 NOTE — Progress Notes (Addendum)
Cardiology Office Note    Date:  07/21/2020   ID:  Kristin Baker, DOB 31-Jan-1991, MRN [id number]  PCP:  Wilson Singer, MD  Cardiologist:  Armanda Magic, MD   Chief Complaint  Patient presents with  . New Patient (Initial Visit)    Palpitations    History of Present Illness:  Kristin Baker is a 30 y.o. female who is being seen today for the evaluation of Palpitations at the request of Gosrani, Corena Herter, MD.  This is a 31yo female with a hx of ADHD, HTN, obesity and RA.  She recently complained of palpitations and her PCP has referred her for further evaluation. She tells me that this has been off and on for a few months.  It does not occur on an everyday basis.  They are sporadic and occur once weekly lasting a few minutes and resolve.  She describes it as a fast heart beat that is irregular and she will get very weak and tunnel vision and will have to sit down to not pass out.  She will get SOB and her chest will hurt with a stabbing pain when she takes a deep breath in  During the palpitations.  She has chronic LLE edema due to a prior leg injury and occasionally bilateral in her legs throughout the day sporadically.  She denies any exertional chest pain or SOB.    She does have a history of tobacco use 1.5ppd.  She drinks ETOH 1-2 times weekly.  Her grandmother has CAD and had an MI at 24 and several CVAs.  Her father also had an MI in his 8's.    Past Medical History:  Diagnosis Date  . Abdominal pain   . ADHD   . ANA positive   . Back pain   . DDD (degenerative disc disease), lumbar   . Eye pain, right   . Fatigue   . Frontal sinusitis   . Hypertension   . Hypothyroidism   . Obesity, morbid (HCC)   . Palpitations   . Rheumatoid arthritis [facility])     Past Surgical History:  Procedure Laterality Date  . ABLATION     05/22/2020 left side, 06/14/2020 right side     Current Medications: Current Meds  Medication Sig  . b complex vitamins capsule Take 1 capsule  by mouth daily.  . hydrochlorothiazide (HYDRODIURIL) 12.5 MG tablet TAKE 1 TABLET(12.5 MG) BY MOUTH DAILY  . ibuprofen (ADVIL) 200 MG tablet Take 800 mg by mouth as needed.  Marland Kitchen levonorgestrel (MIRENA) 20 MCG/24HR IUD 1 each by Intrauterine route once.  . thyroid (NP THYROID) 60 MG tablet Take 1 tablet (60 mg total) by mouth daily.    Allergies:   Flexeril [cyclobenzaprine]   Social History   Socioeconomic History  . Marital status: Media planner    Spouse name: Not on file  . Number of children: Not on file  . Years of education: Not on file  . Highest education level: Not on file  Occupational History  . Not on file  Tobacco Use  . Smoking status: Current Every Day Smoker    Packs/day: 1.00    Years: 15.00    Pack years: 15.00    Types: Cigarettes  . Smokeless tobacco: Never Used  . Tobacco comment: working with PCP on this  Vaping Use  . Vaping Use: Former  Substance and Sexual Activity  . Alcohol use: Yes    Comment: occasionally /social events  . Drug use: Not  Currently    Comment: smoked weed during teen years  . Sexual activity: Yes    Birth control/protection: I.U.D.  Other Topics Concern  . Not on file  Social History Narrative   Married for May 2021.Lives with husband and daughter.Homemaker.Husband works at [employer].   Social Determinants of Health   Financial Resource Strain: Not on file  Food Insecurity: Not on file  Transportation Needs: Not on file  Physical Activity: Not on file  Stress: Not on file  Social Connections: Not on file     Family History:  The patient's family history includes ADD / ADHD in her maternal aunt, mother, and sister; Bipolar disorder in her mother; Cervical cancer in her mother; Diabetes in her father; Healthy in her daughter; Heart attack in her father; Hyperlipidemia in her mother; Peripheral Artery Disease in her mother.   ROS:   Please see the history of present illness.    ROS All other systems reviewed  and are negative.  No flowsheet data found.  PHYSICAL EXAM:   VS:  BP 128/80   Pulse 95   Ht 5\' 7"  (1.702 m)   Wt 290 lb (131.5 kg)   SpO2 99%   BMI 45.42 kg/m    GEN: Well nourished, well developed, in no acute distress  HEENT: normal  Neck: no JVD, carotid bruits, or masses Cardiac: RRR; no murmurs, rubs, or gallops,no edema.  Intact distal pulses bilaterally.  Respiratory:  clear to auscultation bilaterally, normal work of breathing GI: soft, nontender, nondistended, + BS MS: no deformity or atrophy  Skin: warm and dry, no rash Neuro:  Alert and Oriented x 3, Strength and sensation are intact Psych: euthymic mood, full affect  Wt Readings from Last 3 Encounters:  07/21/20 290 lb (131.5 kg)  07/20/20 291 lb 9.6 oz (132.3 kg)  07/10/20 293 lb 6.4 oz (133.1 kg)      Studies/Labs Reviewed:   EKG:  EKG is ordered today.  The ekg ordered today demonstrates NSR with T wave inversions in the inferolateral leads  Recent Labs: 07/06/2020: ALT 12; BUN 5; Creat 0.64; Hemoglobin 14.4; Platelets 428; Potassium 4.5; Sodium 138; TSH 5.57   Lipid Panel    Component Value Date/Time   CHOL 223 (H) 01/11/2020 1154   TRIG 119 01/11/2020 1154   HDL 32 (L) 01/11/2020 1154   CHOLHDL 7.0 (H) 01/11/2020 1154   LDLCALC 166 (H) 01/11/2020 1154    Additional studies/ records that were reviewed today include:  OV notes from PCP    ASSESSMENT:    1. Palpitations   2. Nonspecific abnormal electrocardiogram (ECG) (EKG)   3. Essential hypertension   4. Family history of premature coronary artery disease      PLAN:  In order of problems listed above:  1. Palpitations -unclear etiology -will get a 30 day event monitor  2.  Abnormal EKG -she has T wave abnormalities inferolaterally and this may be due to repol abnormality from HTN -with her family hx of premature CAD, ongoing smoking and HTN she needs further ischemic workup.   -She has an abnormal EKG at baseline and I would like  to avoid coronary CTA given her young age -I will get a stress echo and coronary Ca score -Shared Decision Making/Informed Consent The risks [chest pain, shortness of breath, cardiac arrhythmias, dizziness, blood pressure fluctuations, myocardial infarction, stroke/transient ischemic attack, and life-threatening complications (estimated to be 1 in 10,000)], benefits (risk stratification, diagnosing coronary artery disease, treatment guidance) and  alternatives of a stress or dobutamine stress echocardiogram were discussed in detail with Ms. Laube and she agrees to proceed.  3.  HTN -BP controlled on exam today -continue HCTZ 12.5mg  daily  4.  Fm hx of premature CAD -his father and MGM had MIs at in late 15-40's    Medication Adjustments/Labs and Tests Ordered: Current medicines are reviewed at length with the patient today.  Concerns regarding medicines are outlined above.  Medication changes, Labs and Tests ordered today are listed in the Patient Instructions below.  There are no Patient Instructions on file for this visit.   Signed, Armanda Magic, MD  07/21/2020 9:06 AM    [facility] [address] [place], [place], [place]  [address] Phone: [phone]; Fax: [phone]

## 2020-07-21 NOTE — Patient Instructions (Signed)
Medication Instructions:  Your physician recommends that you continue on your current medications as directed. Please refer to the Current Medication list given to you today.  *If you need a refill on your cardiac medications before your next appointment, please call your pharmacy*   Lab Work: none    Testing/Procedures: Your physician has requested that you have a stress echocardiogram. For further information please visit [email or web address] Please follow instruction sheet as given.  Your physician has recommended that you wear an event monitor. Event monitors are medical devices that record the heart's electrical activity. Doctors most often [place] these monitors to diagnose arrhythmias. Arrhythmias are problems with the speed or rhythm of the heartbeat. The monitor is a small, portable device. You can wear one while you do your normal daily activities. This is usually used to diagnose what is causing palpitations/syncope (passing out).  Your physician recommends you have a coronary calcium score completed.    Follow-Up: At [facility], you and your health needs are our priority.  As part of our continuing mission to provide you with exceptional heart care, we have created designated Provider Care Teams.  These Care Teams include your primary Cardiologist (physician) and Advanced Practice Providers (APPs -  Physician Assistants and Nurse Practitioners) who all work together to provide you with the care you need, when you need it.  We recommend signing up for the patient portal called "MyChart".  Sign up information is provided on this After Visit Summary.  MyChart is used to connect with patients for Virtual Visits (Telemedicine).  Patients are able to view lab/test results, encounter notes, upcoming appointments, etc.  Non-urgent messages can be sent to your provider as well.   To learn more about what you can do with MyChart, go to [email or web address].    Your next appointment:    As needed based on testing  The format for your next appointment:   In Person  Provider:   Armanda Magic, MD

## 2020-07-21 NOTE — Addendum Note (Signed)
Addended by: Terrilyn Saver on: 07/21/2020 09:21 AM   Modules accepted: Orders
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
## 2020-07-25 LAB — IMMUNOFIXATION INTE

## 2020-07-26 ENCOUNTER — Encounter (INDEPENDENT_AMBULATORY_CARE_PROVIDER_SITE_OTHER): Payer: Medicaid Other

## 2020-07-26 DIAGNOSIS — R002 Palpitations: Secondary | ICD-10-CM | POA: Diagnosis not present

## 2020-07-28 NOTE — Addendum Note (Signed)
Addended by: Quintella Reichert on: 07/28/2020 07:54 AM   Modules accepted: Orders

## 2020-07-31 NOTE — Telephone Encounter (Signed)
Submitted Prior Auth today. Scanned notes for review to [facility] ; TBA

## 2020-08-02 ENCOUNTER — Ambulatory Visit: Payer: Medicaid Other | Admitting: Rheumatology

## 2020-08-02 ENCOUNTER — Other Ambulatory Visit: Payer: Self-pay

## 2020-08-02 ENCOUNTER — Encounter: Payer: Self-pay | Admitting: Rheumatology

## 2020-08-02 VITALS — BP 155/75 | HR 76 | Resp 15 | Ht 67.0 in | Wt 291.2 lb

## 2020-08-02 DIAGNOSIS — K529 Noninfective gastroenteritis and colitis, unspecified: Secondary | ICD-10-CM

## 2020-08-02 DIAGNOSIS — H04123 Dry eye syndrome of bilateral lacrimal glands: Secondary | ICD-10-CM | POA: Diagnosis not present

## 2020-08-02 DIAGNOSIS — M79672 Pain in left foot: Secondary | ICD-10-CM

## 2020-08-02 DIAGNOSIS — Z8261 Family history of arthritis: Secondary | ICD-10-CM

## 2020-08-02 DIAGNOSIS — R002 Palpitations: Secondary | ICD-10-CM

## 2020-08-02 DIAGNOSIS — G8929 Other chronic pain: Secondary | ICD-10-CM

## 2020-08-02 DIAGNOSIS — Z8269 Family history of other diseases of the musculoskeletal system and connective tissue: Secondary | ICD-10-CM

## 2020-08-02 DIAGNOSIS — F17200 Nicotine dependence, unspecified, uncomplicated: Secondary | ICD-10-CM

## 2020-08-02 DIAGNOSIS — I10 Essential (primary) hypertension: Secondary | ICD-10-CM

## 2020-08-02 DIAGNOSIS — Z820 Family history of epilepsy and other diseases of the nervous system: Secondary | ICD-10-CM

## 2020-08-02 DIAGNOSIS — M79671 Pain in right foot: Secondary | ICD-10-CM

## 2020-08-02 DIAGNOSIS — E039 Hypothyroidism, unspecified: Secondary | ICD-10-CM

## 2020-08-02 DIAGNOSIS — M51369 Other intervertebral disc degeneration, lumbar region without mention of lumbar back pain or lower extremity pain: Secondary | ICD-10-CM

## 2020-08-02 DIAGNOSIS — R768 Other specified abnormal immunological findings in serum: Secondary | ICD-10-CM

## 2020-08-02 DIAGNOSIS — M79641 Pain in right hand: Secondary | ICD-10-CM | POA: Diagnosis not present

## 2020-08-02 DIAGNOSIS — M25562 Pain in left knee: Secondary | ICD-10-CM | POA: Diagnosis not present

## 2020-08-02 DIAGNOSIS — M5136 Other intervertebral disc degeneration, lumbar region: Secondary | ICD-10-CM

## 2020-08-02 DIAGNOSIS — M79642 Pain in left hand: Secondary | ICD-10-CM

## 2020-08-03 ENCOUNTER — Encounter: Payer: Medicaid Other | Attending: Nurse Practitioner | Admitting: Nutrition

## 2020-08-03 ENCOUNTER — Encounter: Payer: Self-pay | Admitting: Nutrition

## 2020-08-03 ENCOUNTER — Other Ambulatory Visit: Payer: Self-pay

## 2020-08-03 VITALS — Ht 67.0 in | Wt 289.0 lb

## 2020-08-03 DIAGNOSIS — I10 Essential (primary) hypertension: Secondary | ICD-10-CM | POA: Insufficient documentation

## 2020-08-03 NOTE — Patient Instructions (Addendum)
Goals  Eat meals on time Keep a food journal Walk 15-20 mintues per daily.

## 2020-08-03 NOTE — Progress Notes (Signed)
Medical Nutrition Therapy:  Appt start time: 0800 end time:  0915.   Assessment:  Primary concerns today: Morbid Obesity. Wants to lose weight..  Lives with her husband and daughter. She does the cooking. SAHM Gained 100 lbs in the last year. Use to weight 190's. Just got diagnosed with thyroid issues a few months ago. Has been working with her PCP and RA MD to get medications adjusted. Started seeing Dr. Karilyn Cota in August 2021. PMH: RA, HTN, Hypothyroidism, Hyperlipidemia, history of eating disorder-anorexia and bulemia and was in and out of inpatient facilities when she was younger. She reports her mom has an eating disorder also. Has seen therapist in past but not right now. Use to be on anxiety and depression meds but doesn't like the way they make her feel so she was slowly weaned off with her therapist when she was younger.  Admits to eating inconsistently and emotional eating. Has a lot of stress and anxiety in her life. She notes she is addicted to Diet Coke and Coffee with sugar in it. Pays monthly for unlimited drinks at [employer] and gets 32 oz of diet coke multiple times a day. Drinks water. Has her water, coffee and diet coke with her at all times.  Loves to eat low carb vegetables. Skips lunch often. Doesn't like a lot of starches but does like fruit. Allergic to milk and avoids cheese.  Not getting any physical activity right now.  Willing to try and make some small changes in her lifestyle to provide needed weight loss.  Lab Results  Component Value Date   HGBA1C 5.1 01/11/2020   CMP Latest Ref Rng & Units 07/06/2020 04/04/2020 01/26/2020  Glucose 65 - 139 mg/dL 81 85 644(I)  BUN 7 - 25 mg/dL 5(L) 5(L) 7  Creatinine 0.50 - 1.10 mg/dL 3.47 4.25 9.56  Sodium 135 - 146 mmol/L 138 142 141  Potassium 3.5 - 5.3 mmol/L 4.5 4.4 4.2  Chloride 98 - 110 mmol/L 106 108 107  CO2 20 - 32 mmol/L 25 27 22   Calcium 8.6 - 10.2 mg/dL 9.3 9.3 9.5  Total Protein 6.1 - 8.1 g/dL 7.0 6.7 7.5  Total  Bilirubin 0.2 - 1.2 mg/dL 0.3 0.2 0.3  Alkaline Phos 38 - 126 U/L - - -  AST 10 - 30 U/L 12 16 14   ALT 6 - 29 U/L 12 14 16    Lipid Panel     Component Value Date/Time   CHOL 223 (H) 01/11/2020 1154   TRIG 119 01/11/2020 1154   HDL 32 (L) 01/11/2020 1154   CHOLHDL 7.0 (H) 01/11/2020 1154   LDLCALC 166 (H) 01/11/2020 1154    Preferred Learning Style:     No preference indicated   Learning Readiness:   Ready  Change in progress   MEDICATIONS:    DIETARY INTAKE:  24-hr recall:  B ( 5-6 AM) 2 glasses of water with meds. 9-10 am egg with plant based butter, 1 slice sourdough bread, with everything bagel season, hot sauce, spicy mayo with truffles, Ice coffee with truvia/stevia 32 oz  Diet coke, water and coffee with sugar throughout the day., Lunch: 12-2 rice cakes -2-3 rice cakes, Coffee/diet coke/water Using some almond milk or skinny syrups instead of creamers, just started recently., Dinner: cauliflower rice pizza-3-4 slices, diet coke 32 oz or coffee  She notes she does drink 7/8 bottles of water per day in addition to all the diet coke and sugar coffee she drinks.  Usual physical activity: ADL  Estimated energy needs: 1400  calories 154 g carbohydrates 105 g protein 39 g fat  Progress Towards Goal(s):  Some progress.   Nutritional Diagnosis:  [place]-3.3 Overweight/obesity As related to inconsistent nutrient dense food intake.  As evidenced by BM 45 and diet recall.    Intervention:  My Plate, portion sizes, emotional eating, timing of meals, nutrient dense foods, foods to avoid due to inflammatory properties, benefits of a plant based diet and need for exercise to encourage weight loss. Dangers of diet sodas and chronic coffee/caffeine consumption.   Goals  Eat meals on time Keep a food journal Walk 15-20 mintues per daily.   Teaching Method Utilized:  Visual Auditory Hands on  Handouts given during visit include:  The Plate Method   Meal Plan  card  Weight loss tips   Barriers to learning/adherence to lifestyle change: may need to re establish mental health counseling to assist with her emotional issues.  Demonstrated degree of understanding via:  Teach Back   Monitoring/Evaluation:  Dietary intake, exercise, and body weight in 1 month(s). Recommend to refer back to seeing a therapist for her disordered eating and emotional issues.  Recommend to refer to an Endocrinologist for her hypothyroidism.  Recommend treatment for hyperlipidemia. Recommend to check Vit D levels if not alreay done.

## 2020-08-04 ENCOUNTER — Other Ambulatory Visit (HOSPITAL_COMMUNITY)
Admission: RE | Admit: 2020-08-04 | Discharge: 2020-08-04 | Disposition: A | Discharge status: Home / Self Care | Payer: Medicaid Other | Source: Ambulatory Visit | Attending: Cardiology | Admitting: Cardiology

## 2020-08-04 DIAGNOSIS — Z20822 Contact with and (suspected) exposure to covid-19: Secondary | ICD-10-CM | POA: Diagnosis not present

## 2020-08-04 DIAGNOSIS — Z01812 Encounter for preprocedural laboratory examination: Secondary | ICD-10-CM | POA: Insufficient documentation

## 2020-08-04 LAB — SARS CORONAVIRUS 2 (TAT 6-24 HRS): SARS Coronavirus 2: NEGATIVE

## 2020-08-07 ENCOUNTER — Telehealth (HOSPITAL_COMMUNITY): Payer: Self-pay

## 2020-08-07 ENCOUNTER — Other Ambulatory Visit (HOSPITAL_COMMUNITY): Payer: Medicaid Other

## 2020-08-07 NOTE — Telephone Encounter (Signed)
Spoke to the patient, detailed instructions given. She stated that she understood and would be here for her test. S.Williams EMTP

## 2020-08-08 ENCOUNTER — Inpatient Hospital Stay: Admission: RE | Admit: 2020-08-08 | Payer: Medicaid Other | Source: Ambulatory Visit

## 2020-08-08 ENCOUNTER — Other Ambulatory Visit: Payer: Self-pay

## 2020-08-08 ENCOUNTER — Ambulatory Visit (HOSPITAL_COMMUNITY): Payer: Medicaid Other | Attending: Cardiovascular Disease

## 2020-08-08 ENCOUNTER — Ambulatory Visit (HOSPITAL_COMMUNITY): Payer: Medicaid Other

## 2020-08-08 DIAGNOSIS — R9431 Abnormal electrocardiogram [ECG] [EKG]: Secondary | ICD-10-CM

## 2020-08-08 MED ORDER — PERFLUTREN LIPID MICROSPHERE
1.0000 mL | INTRAVENOUS | Status: AC | PRN
  Administered 2020-08-08: 3 mL via INTRAVENOUS
  Administered 2020-08-08: 2 mL via INTRAVENOUS

## 2020-08-09 LAB — ECHOCARDIOGRAM STRESS TEST
Area-P 1/2: 3.19 cm2
S' Lateral: 3 cm

## 2020-08-10 ENCOUNTER — Telehealth: Payer: Self-pay | Admitting: Cardiology

## 2020-08-10 NOTE — Telephone Encounter (Signed)
Kristin Reichert, MD  08/09/2020 1:45 PM EDT      Please let patient know that stress test was fine   Called patient back to let her know of results.

## 2020-08-10 NOTE — Telephone Encounter (Signed)
Patient returning call to discuss test  results  ?

## 2020-08-17 ENCOUNTER — Ambulatory Visit (HOSPITAL_COMMUNITY): Payer: Medicaid Other

## 2020-08-31 ENCOUNTER — Encounter (INDEPENDENT_AMBULATORY_CARE_PROVIDER_SITE_OTHER): Payer: Self-pay | Admitting: Internal Medicine

## 2020-08-31 ENCOUNTER — Ambulatory Visit (INDEPENDENT_AMBULATORY_CARE_PROVIDER_SITE_OTHER): Payer: Medicaid Other | Admitting: Internal Medicine

## 2020-08-31 ENCOUNTER — Other Ambulatory Visit: Payer: Self-pay

## 2020-08-31 VITALS — BP 130/72 | HR 97 | Temp 97.5°F | Resp 18 | Ht 67.0 in | Wt 283.0 lb

## 2020-08-31 DIAGNOSIS — I10 Essential (primary) hypertension: Secondary | ICD-10-CM

## 2020-08-31 DIAGNOSIS — E039 Hypothyroidism, unspecified: Secondary | ICD-10-CM

## 2020-08-31 MED ORDER — SAXENDA 18 MG/3ML ~~LOC~~ SOPN: 0.6000 mg | PEN_INJECTOR | Freq: Every day | SUBCUTANEOUS | 1 refills | Status: DC

## 2020-08-31 NOTE — Progress Notes (Signed)
Metrics: Intervention Frequency ACO  Documented Smoking Status Yearly  Screened one or more times in 24 months  Cessation Counseling or  Active cessation medication Past 24 months  Past 24 months   Guideline developer: UpToDate (See UpToDate for funding source) Date Released: 2014       Wellness Office Visit  Subjective:  Patient ID: Kristin Baker, female    DOB: 1990-08-19  Age: 30 y.o. MRN: [id number]  CC: This lady comes in for follow-up of morbid obesity, hypertension, hypothyroidism. HPI  She has been doing well and sees a nutritionist and is now strategizing with behavior towards food.  As a result, she has managed to lose weight.  Also, she is moving her body more. She has tolerated the higher dose of NP thyroid 60 mg daily. She was asking today about the use of Saxenda for obesity. She continues on hydrochlorothiazide for her hypertension. Past Medical History:  Diagnosis Date  . Abdominal pain   . ADHD   . ANA positive   . Back pain   . DDD (degenerative disc disease), lumbar   . Eye pain, right   . Fatigue   . Frontal sinusitis   . Hypertension   . Hypothyroidism   . Obesity, morbid (HCC)   . Palpitations   . Rheumatoid arthritis [facility])    Past Surgical History:  Procedure Laterality Date  . ABLATION     05/22/2020 left side, 06/14/2020 right side      Family History  Problem Relation Age of Onset  . Hyperlipidemia Mother   . Peripheral Artery Disease Mother   . ADD / ADHD Mother   . Bipolar disorder Mother   . Cervical cancer Mother   . Diabetes Father   . Heart attack Father   . ADD / ADHD Sister   . Healthy Daughter   . ADD / ADHD Maternal Aunt     Social History   Social History Narrative   Married for May 2021.Lives with husband and daughter.Homemaker.Husband works at [employer].   Social History   Tobacco Use  . Smoking status: Current Every Day Smoker    Packs/day: 1.00    Years: 15.00    Pack years: 15.00    Types:  Cigarettes  . Smokeless tobacco: Never Used  . Tobacco comment: working with PCP on this  Substance Use Topics  . Alcohol use: Yes    Comment: occasionally /social events    Current Meds  Medication Sig  . hydrochlorothiazide (HYDRODIURIL) 12.5 MG tablet TAKE 1 TABLET(12.5 MG) BY MOUTH DAILY  . ibuprofen (ADVIL) 200 MG tablet Take 800 mg by mouth as needed.  Marland Kitchen levonorgestrel (MIRENA) 20 MCG/24HR IUD 1 each by Intrauterine route once.  . Liraglutide -Weight Management (SAXENDA) 18 MG/3ML SOPN Inject 0.6 mg into the skin daily.  . Multiple Vitamins-Minerals (MULTI-VITAMIN GUMMIES PO) Take 1 Dose by mouth daily.  Marland Kitchen thyroid (NP THYROID) 60 MG tablet Take 1 tablet (60 mg total) by mouth daily.     Flowsheet Row Nutrition from 08/03/2020 in [facility]  PHQ-9 Total Score 8      Objective:   Today's Vitals: BP 130/72 (BP Location: Left Arm, Patient Position: Sitting, Cuff Size: Normal)   Pulse 97   Temp (!) 97.5 F (36.4 C) (Temporal)   Resp 18   Ht 5\' 7"  (1.702 m)   Wt 283 lb (128.4 kg)   SpO2 96%   BMI 44.32 kg/m  Vitals with BMI 08/31/2020 08/08/2020  08/03/2020  Height 5\' 7"  5\' 7"  5\' 7"   Weight 283 lbs 290 lbs 289 lbs  BMI 44.31 45.41 45.25  Systolic 130 - -  Diastolic 72 - -  Pulse 97 - -     Physical Exam  She looks systemically well and has lost 7 pounds.  Blood pressure is in a good range.  She remains morbidly obese.     Assessment   1. Hypothyroidism, unspecified type   2. Hypertension, unspecified type   3. Morbid obesity (HCC)       Tests ordered Orders Placed This Encounter  Procedures  . Basic metabolic panel  . T3, free  . TSH     Plan: 1. She will continue with NP thyroid 60 mg daily and we will check thyroid function again.  She understands that I may further optimize her NP thyroid dose. 2. Continue with hydrochlorothiazide at the current dose and we will check electrolytes. 3. I have sent a  prescription for Saxenda which has been approved by the FDA for obesity.  Hopefully the insurance will approve it. 4. Follow-up with Sarah as scheduled.  We need to optimize thyroid as much as we can to help with insulin resistance. 5. Today I spent 40 minutes discussing all her results, nutrition and exercise and the importance of reducing insulin resistance and her journey towards health.   Meds ordered this encounter  Medications  . Liraglutide -Weight Management (SAXENDA) 18 MG/3ML SOPN    Sig: Inject 0.6 mg into the skin daily.    Dispense:  3 mL    Refill:  1    Caelyn Route , MD

## 2020-09-01 LAB — TSH: TSH: 2.31 mIU/L

## 2020-09-01 LAB — BASIC METABOLIC PANEL
BUN: 11 mg/dL (ref 7–25)
CO2: 28 mmol/L (ref 20–32)
Calcium: 9.3 mg/dL (ref 8.6–10.2)
Chloride: 105 mmol/L (ref 98–110)
Creat: 0.74 mg/dL (ref 0.50–1.10)
Glucose, Bld: 77 mg/dL (ref 65–139)
Potassium: 3.8 mmol/L (ref 3.5–5.3)
Sodium: 140 mmol/L (ref 135–146)

## 2020-09-01 LAB — T3, FREE: T3, Free: 3.4 pg/mL (ref 2.3–4.2)

## 2020-09-05 ENCOUNTER — Telehealth (INDEPENDENT_AMBULATORY_CARE_PROVIDER_SITE_OTHER): Payer: Self-pay

## 2020-09-05 ENCOUNTER — Ambulatory Visit (INDEPENDENT_AMBULATORY_CARE_PROVIDER_SITE_OTHER): Payer: Medicaid Other | Admitting: Obstetrics & Gynecology

## 2020-09-05 ENCOUNTER — Other Ambulatory Visit: Payer: Self-pay

## 2020-09-05 ENCOUNTER — Other Ambulatory Visit (INDEPENDENT_AMBULATORY_CARE_PROVIDER_SITE_OTHER): Payer: Self-pay | Admitting: Internal Medicine

## 2020-09-05 ENCOUNTER — Encounter: Payer: Self-pay | Admitting: Obstetrics & Gynecology

## 2020-09-05 ENCOUNTER — Other Ambulatory Visit (HOSPITAL_COMMUNITY)
Admission: RE | Admit: 2020-09-05 | Discharge: 2020-09-05 | Disposition: A | Discharge status: Home / Self Care | Payer: Medicaid Other | Source: Ambulatory Visit | Attending: Obstetrics & Gynecology | Admitting: Obstetrics & Gynecology

## 2020-09-05 VITALS — BP 127/78 | HR 81 | Ht 67.0 in | Wt 283.0 lb

## 2020-09-05 DIAGNOSIS — Z3009 Encounter for other general counseling and advice on contraception: Secondary | ICD-10-CM

## 2020-09-05 DIAGNOSIS — Z01419 Encounter for gynecological examination (general) (routine) without abnormal findings: Secondary | ICD-10-CM

## 2020-09-05 DIAGNOSIS — E669 Obesity, unspecified: Secondary | ICD-10-CM

## 2020-09-05 DIAGNOSIS — Z30432 Encounter for removal of intrauterine contraceptive device: Secondary | ICD-10-CM

## 2020-09-05 MED ORDER — NP THYROID 90 MG PO TABS: 90.0000 mg | ORAL_TABLET | Freq: Every day | ORAL | 3 refills | Status: DC

## 2020-09-05 NOTE — Progress Notes (Signed)
WELL-WOMAN EXAMINATION Patient name: Kristin Baker MRN [id number]  Date of birth: 02-04-1991 Chief Complaint:   Gynecologic Exam  History of Present Illness:   Kristin Baker is a 30 y.o. G32P1011 Caucasian female being seen today for a routine well-woman exam.  Today she notes that they would consider a pregnancy in the near future- desire removal of IUD.  Of note, even without IUD reports h/o irregular periods- every couple of mos.  No LMP recorded. (Menstrual status: IUD). Denies issues with her menses The current method of family planning is IUD- 2017 Mirena.  Reports no menses and no issues with menses.   Last pap 2017.  Last mammogram: n/a. Last colonoscopy: n/a  Depression screen [facility] 2/9 09/05/2020 08/03/2020 07/20/2020 01/11/2020  Decreased Interest 0 1 0 0  Down, Depressed, Hopeless 0 1 0 0  PHQ - 2 Score 0 2 0 0  Altered sleeping 2 1 0 -  Tired, decreased energy 3 1 0 -  Change in appetite 1 3 0 -  Feeling bad or failure about yourself  0 0 0 -  Trouble concentrating 3 1 0 -  Moving slowly or fidgety/restless 1 0 0 -  Suicidal thoughts 0 0 0 -  PHQ-9 Score 10 8 0 -  Difficult doing work/chores - Somewhat difficult Not difficult at all -      Review of Systems:   Pertinent items are noted in HPI Denies any headaches, blurred vision, fatigue, shortness of breath, chest pain, abdominal pain, bowel movements, urination, or intercourse unless otherwise stated above.  Pertinent History Reviewed:  Reviewed past medical,surgical, social and family history.  Reviewed problem list, medications and allergies. Physical Assessment:   Vitals:   09/05/20 1034  BP: 127/78  Pulse: 81  Weight: 283 lb (128.4 kg)  Height: 5\' 7"  (1.702 m)  Body mass index is 44.32 kg/m.        Physical Examination:   General appearance - well appearing, and in no distress  Mental status - alert, oriented to person, place, and time  Psych:  She has a normal mood and affect  Skin -  warm and dry, normal color, no suspicious lesions noted  Chest - effort normal, all lung fields clear to auscultation bilaterally  Heart - normal rate and regular rhythm  Neck:  midline trachea, no thyromegaly or nodules  Breasts - breasts appear normal, no suspicious masses, no skin or nipple changes or  axillary nodes  Abdomen - obese, soft, nontender, nondistended, no masses or organomegaly  Pelvic - VULVA: normal appearing vulva with no masses, tenderness or lesions  VAGINA: normal appearing vagina with normal color and discharge, no lesions  CERVIX: normal appearing cervix without discharge or lesions, no CMT  Thin prep pap is done with HR HPV cotesting The cervix was visualized, and the strings were visible. They were grasped with ring forceps and the Mirena  IUD was easily removed intact without complications. The patient tolerated the procedure well.    UTERUS: uterus is felt to be normal size, shape, consistency and nontender   ADNEXA: No adnexal masses or tenderness noted.  Extremities:  No swelling or varicosities noted  Assessment & Plan:  1) Well-Woman Exam Pap collected, reviewed guidelines  2) IUD removal -device removed without complications  3) Family planning -advised PNV daily -discussed concern for PCOS- pt currently working with dietician and working on "healty you" -Discussed further work up if not pregnant w/n 51yr of actively trying  No orders of the  defined types were placed in this encounter.   Meds: No orders of the defined types were placed in this encounter.   Follow-up: Return in about 1 year (around 09/05/2021) for Annual.   Myna Hidalgo, DO Attending Obstetrician & Gynecologist, [facility] for [facility], [facility]

## 2020-09-05 NOTE — Telephone Encounter (Signed)
So she will have to be tried on certain medications. Due to her benefits , will not cover for it at the beginning. SO will ave to wait for Dr Karilyn Cota to try other alternatives 1st.

## 2020-09-05 NOTE — Telephone Encounter (Signed)
Patient called and stated that she wanted to know the next step in the process for getting her Saxenda prescription approved. Patient knows that it was denied and talked to Dr. Karilyn Cota and wants Nellie to call her back to let her know what will be done next and what to expect.

## 2020-09-06 ENCOUNTER — Encounter: Payer: Medicaid Other | Attending: Internal Medicine | Admitting: Nutrition

## 2020-09-06 VITALS — Ht 67.0 in | Wt 283.0 lb

## 2020-09-06 DIAGNOSIS — I10 Essential (primary) hypertension: Secondary | ICD-10-CM | POA: Insufficient documentation

## 2020-09-06 NOTE — Progress Notes (Signed)
Medical Nutrition Therapy:  Appt start time: 1030 end time: 1100  Assessment:  Primary concerns today: Morbid Obesity.  Follow up obesity: Changes made: Lost 7 lbs. Eating more on schedule. Has cut out junk food. Working on moving more.         Wants to lose weight..  Lives with her husband and daughter. She does the cooking. SAHM Gained 100 lbs in the last year. Use to weight 190's. Just got diagnosed with thyroid issues a few months ago. Has been working with her PCP and RA MD to get medications adjusted. Started seeing Dr. Karilyn Cota in August 2021. PMH: RA, HTN, Hypothyroidism, Hyperlipidemia, history of eating disorder-anorexia and bulemia and was in and out of inpatient facilities when she was younger. She reports her mom has an eating disorder also. Has seen therapist in past but not right now. Use to be on anxiety and depression meds but doesn't like the way they make her feel so she was slowly weaned off with her therapist when she was younger.  Admits to eating inconsistently and emotional eating. Has a lot of stress and anxiety in her life. She notes she is addicted to Diet Coke and Coffee with sugar in it. Pays monthly for unlimited drinks at [employer] and gets 32 oz of diet coke multiple times a day. Drinks water. Has her water, coffee and diet coke with her at all times.  Loves to eat low carb vegetables. Skips lunch often. Doesn't like a lot of starches but does like fruit. Allergic to milk and avoids cheese.  Not getting any physical activity right now.  Willing to try and make some small changes in her lifestyle to provide needed weight loss.  Lab Results  Component Value Date   HGBA1C 5.1 01/11/2020   CMP Latest Ref Rng & Units 08/31/2020 07/06/2020 04/04/2020  Glucose 65 - 139 mg/dL 77 81 85  BUN 7 - 25 mg/dL 11 5(L) 5(L)  Creatinine 0.50 - 1.10 mg/dL 3.76 2.83 1.51  Sodium 135 - 146 mmol/L 140 138 142  Potassium 3.5 - 5.3 mmol/L 3.8 4.5 4.4  Chloride 98 - 110 mmol/L  105 106 108  CO2 20 - 32 mmol/L 28 25 27   Calcium 8.6 - 10.2 mg/dL 9.3 9.3 9.3  Total Protein 6.1 - 8.1 g/dL - 7.0 6.7  Total Bilirubin 0.2 - 1.2 mg/dL - 0.3 0.2  Alkaline Phos 38 - 126 U/L - - -  AST 10 - 30 U/L - 12 16  ALT 6 - 29 U/L - 12 14   Lipid Panel     Component Value Date/Time   CHOL 223 (H) 01/11/2020 1154   TRIG 119 01/11/2020 1154   HDL 32 (L) 01/11/2020 1154   CHOLHDL 7.0 (H) 01/11/2020 1154   LDLCALC 166 (H) 01/11/2020 1154    Preferred Learning Style:     No preference indicated   Learning Readiness:   Ready  Change in progress   MEDICATIONS:    DIETARY INTAKE:  24-hr recall:  B ( 5-6 AM) 1 slice of sourdough, 2 eggs and spicy mayo  9-10 am egg with plant based butter, 1 slice sourdough bread, with everything bagel season, hot sauce, spicy mayo with truffles, Ice coffee with truvia/stevia  Lunch: spinach,onion, lime, balsamic vinegaette Dinner: Fish, carrots, potato or vegetable medly. Water  Lunch: 12-2 rice cakes -2-3 rice cakes, Coffee/diet coke/water Using some almond milk or skinny syrups instead of creamers, just started recently., Dinner: cauliflower rice pizza-3-4 slices, diet coke  32 oz or coffee  She notes she does drink 7/8 bottles of water per day in addition to all the diet coke and sugar coffee she drinks.  Usual physical activity: ADL  Estimated energy needs: 1400  calories 154 g carbohydrates 105 g protein 39 g fat  Progress Towards Goal(s):  Some progress.   Nutritional Diagnosis:  [place]-3.3 Overweight/obesity As related to inconsistent nutrient dense food intake.  As evidenced by BM 45 and diet recall.    Intervention:  My Plate, portion sizes, emotional eating, timing of meals, nutrient dense foods, foods to avoid due to inflammatory properties, benefits of a plant based diet and need for exercise to encourage weight loss. Dangers of diet sodas and chronic coffee/caffeine consumption.   Goals  Eat meals on time Keep a  food journal Walk 15-20 mintues per daily.   Teaching Method Utilized:  Visual Auditory Hands on  Handouts given during visit include:  The Plate Method   Meal Plan card  Weight loss tips   Barriers to learning/adherence to lifestyle change: may need to re establish mental health counseling to assist with her emotional issues.  Demonstrated degree of understanding via:  Teach Back   Monitoring/Evaluation:  Dietary intake, exercise, and body weight in 1 month(s). Recommend to refer back to seeing a therapist for her disordered eating and emotional issues.  Recommend to refer to an Endocrinologist for her hypothyroidism.  Recommend treatment for hyperlipidemia. Recommend to check Vit D levels if not alreay done.

## 2020-09-06 NOTE — Patient Instructions (Signed)
Goals  Check out [place] over [employer] Limit water intake to 150 oz Increase protein to 4-5 oz for lunch and dinner Limit CHO at 15 g or 1 carb choice per meal. Lose 1 lb per week

## 2020-09-07 ENCOUNTER — Other Ambulatory Visit: Payer: Self-pay | Admitting: Obstetrics & Gynecology

## 2020-09-07 DIAGNOSIS — N761 Subacute and chronic vaginitis: Secondary | ICD-10-CM

## 2020-09-07 LAB — CYTOLOGY - PAP
Comment: NEGATIVE
Diagnosis: NEGATIVE
High risk HPV: NEGATIVE

## 2020-09-07 MED ORDER — METRONIDAZOLE 500 MG PO TABS
500.0000 mg | ORAL_TABLET | Freq: Two times a day (BID) | ORAL | 0 refills | Status: AC
Start: 2020-09-07 — End: 2020-09-14

## 2020-09-07 NOTE — Progress Notes (Signed)
Treatment for Trich sent in- will send mychart note regarding treatment and partner treatment

## 2020-09-11 ENCOUNTER — Other Ambulatory Visit (INDEPENDENT_AMBULATORY_CARE_PROVIDER_SITE_OTHER): Payer: Self-pay | Admitting: Nurse Practitioner

## 2020-09-12 ENCOUNTER — Ambulatory Visit (HOSPITAL_COMMUNITY): Payer: Medicaid Other

## 2020-09-18 ENCOUNTER — Encounter (INDEPENDENT_AMBULATORY_CARE_PROVIDER_SITE_OTHER): Payer: Self-pay | Admitting: Internal Medicine

## 2020-09-18 ENCOUNTER — Other Ambulatory Visit: Payer: Self-pay

## 2020-09-18 ENCOUNTER — Ambulatory Visit (INDEPENDENT_AMBULATORY_CARE_PROVIDER_SITE_OTHER): Payer: Medicaid Other | Admitting: Internal Medicine

## 2020-09-18 VITALS — BP 128/88 | HR 78 | Temp 97.7°F | Ht 67.0 in | Wt 287.2 lb

## 2020-09-18 DIAGNOSIS — N10 Acute tubulo-interstitial nephritis: Secondary | ICD-10-CM | POA: Diagnosis not present

## 2020-09-18 DIAGNOSIS — M545 Low back pain, unspecified: Secondary | ICD-10-CM | POA: Diagnosis not present

## 2020-09-18 DIAGNOSIS — R1012 Left upper quadrant pain: Secondary | ICD-10-CM | POA: Diagnosis not present

## 2020-09-18 MED ORDER — CIPROFLOXACIN HCL 500 MG PO TABS: 500.0000 mg | ORAL_TABLET | Freq: Two times a day (BID) | ORAL | 0 refills | Status: DC

## 2020-09-18 NOTE — Progress Notes (Signed)
Metrics: Intervention Frequency ACO  Documented Smoking Status Yearly  Screened one or more times in 24 months  Cessation Counseling or  Active cessation medication Past 24 months  Past 24 months   Guideline developer: UpToDate (See UpToDate for funding source) Date Released: 2014       Wellness Office Visit  Subjective:  Patient ID: Kristin Baker, female    DOB: 1990/10/10  Age: 30 y.o. MRN: [id number]  CC: Left loin pain HPI  This lady comes in with about an 8-day history of the above symptoms with also orange-colored urine.  The pain is a 7/10 quality.  It seems to come from the left loin and radiate to the left groin.  She denies any actual hematuria.  She denies dysuria.  There is no fever, nausea or vomiting Past Medical History:  Diagnosis Date  . Abdominal pain   . ADHD   . ANA positive   . Back pain   . DDD (degenerative disc disease), lumbar   . Eye pain, right   . Fatigue   . Frontal sinusitis   . Hypertension   . Hypothyroidism   . Obesity, morbid (HCC)   . Palpitations   . Rheumatoid arthritis [facility])    Past Surgical History:  Procedure Laterality Date  . ABLATION     05/22/2020 left side, 06/14/2020 right side      Family History  Problem Relation Age of Onset  . Hyperlipidemia Mother   . Peripheral Artery Disease Mother   . ADD / ADHD Mother   . Bipolar disorder Mother   . Cervical cancer Mother   . Diabetes Father   . Heart attack Father   . ADD / ADHD Sister   . Healthy Daughter   . ADD / ADHD Maternal Aunt     Social History   Social History Narrative   Married for May 2021.Lives with husband and daughter.Homemaker.Husband works at [employer].   Social History   Tobacco Use  . Smoking status: Current Every Day Smoker    Packs/day: 1.00    Years: 15.00    Pack years: 15.00    Types: Cigarettes  . Smokeless tobacco: Never Used  . Tobacco comment: working with PCP on this  Substance Use Topics  . Alcohol use: Yes     Comment: occasionally /social events    Current Meds  Medication Sig  . ciprofloxacin (CIPRO) 500 MG tablet Take 1 tablet (500 mg total) by mouth 2 (two) times daily.  . hydrochlorothiazide (HYDRODIURIL) 12.5 MG tablet TAKE 1 TABLET(12.5 MG) BY MOUTH DAILY  . ibuprofen (ADVIL) 200 MG tablet Take 800 mg by mouth as needed.  Marland Kitchen levonorgestrel (MIRENA) 20 MCG/24HR IUD 1 each by Intrauterine route once.  . Multiple Vitamins-Minerals (MULTI-VITAMIN GUMMIES PO) Take 1 Dose by mouth daily.  . NP THYROID 90 MG tablet Take 1 tablet (90 mg total) by mouth daily.  . traMADol (ULTRAM) 50 MG tablet Take 1 tablet (50 mg total) by mouth every 6 (six) hours as needed for moderate pain.     Flowsheet Row Office Visit from 09/05/2020 in [facility]  PHQ-9 Total Score 10      Objective:   Today's Vitals: BP 128/88   Pulse 78   Temp 97.7 F (36.5 C) (Temporal)   Ht 5\' 7"  (1.702 m)   Wt 287 lb 3.2 oz (130.3 kg)   SpO2 99%   BMI 44.98 kg/m  Vitals with BMI 09/18/2020 09/06/2020 09/05/2020  Height  5\' 7"  5\' 7"  5\' 7"   Weight 287 lbs 3 oz 283 lbs 283 lbs  BMI 44.97 44.31 44.31  Systolic 128 - 127  Diastolic 88 - 78  Pulse 78 - 81     Physical Exam  She looks systemically well and does not appear to be in any pain at the present time.  She is afebrile.     Assessment   1. Left loin pain   2. Acute pyelonephritis   3. Left upper quadrant abdominal pain       Tests ordered Orders Placed This Encounter  Procedures  . CT RENAL STONE STUDY  . Urinalysis w microscopic + reflex cultur     Plan: 1. We will send urine for urinalysis and culture and I will treat her empirically for UTI with ciprofloxacin. 2. In the meantime, I will also arrange for her to have a CT renal stone study for further evaluation.   Meds ordered this encounter  Medications  . ciprofloxacin (CIPRO) 500 MG tablet    Sig: Take 1 tablet (500 mg total) by mouth 2 (two) times daily.    Dispense:  20  tablet    Refill:  0    Karri Kallenbach , MD

## 2020-09-19 LAB — URINALYSIS W MICROSCOPIC + REFLEX CULTURE

## 2020-09-25 ENCOUNTER — Telehealth (INDEPENDENT_AMBULATORY_CARE_PROVIDER_SITE_OTHER): Payer: Self-pay

## 2020-09-25 NOTE — Telephone Encounter (Signed)
Nope, its approved.  She has appt.

## 2020-09-25 NOTE — Telephone Encounter (Signed)
Is there anything further that I need to do to help?

## 2020-09-25 NOTE — Telephone Encounter (Signed)
PRIOR AUTH FOR CT ABDOMEN W CONTRAST CASE # [id number]. VALID: 4/25/22TO 11/02/20.

## 2020-09-26 NOTE — Telephone Encounter (Signed)
Great, thank you!

## 2020-10-02 ENCOUNTER — Ambulatory Visit (HOSPITAL_COMMUNITY): Payer: Medicaid Other

## 2020-10-04 ENCOUNTER — Other Ambulatory Visit: Payer: Self-pay

## 2020-10-04 ENCOUNTER — Encounter (INDEPENDENT_AMBULATORY_CARE_PROVIDER_SITE_OTHER): Payer: Self-pay | Admitting: Nurse Practitioner

## 2020-10-04 ENCOUNTER — Telehealth (INDEPENDENT_AMBULATORY_CARE_PROVIDER_SITE_OTHER): Payer: Self-pay | Admitting: Nurse Practitioner

## 2020-10-04 ENCOUNTER — Ambulatory Visit (INDEPENDENT_AMBULATORY_CARE_PROVIDER_SITE_OTHER): Payer: Medicaid Other | Admitting: Nurse Practitioner

## 2020-10-04 VITALS — BP 132/78 | HR 75 | Temp 97.7°F | Ht 67.0 in | Wt 287.8 lb

## 2020-10-04 DIAGNOSIS — R9089 Other abnormal findings on diagnostic imaging of central nervous system: Secondary | ICD-10-CM

## 2020-10-04 DIAGNOSIS — E038 Other specified hypothyroidism: Secondary | ICD-10-CM | POA: Diagnosis not present

## 2020-10-04 DIAGNOSIS — Z3200 Encounter for pregnancy test, result unknown: Secondary | ICD-10-CM

## 2020-10-04 DIAGNOSIS — R8281 Pyuria: Secondary | ICD-10-CM | POA: Diagnosis not present

## 2020-10-04 DIAGNOSIS — R29898 Other symptoms and signs involving the musculoskeletal system: Secondary | ICD-10-CM

## 2020-10-04 DIAGNOSIS — M5442 Lumbago with sciatica, left side: Secondary | ICD-10-CM

## 2020-10-04 DIAGNOSIS — N924 Excessive bleeding in the premenopausal period: Secondary | ICD-10-CM

## 2020-10-04 LAB — POCT URINE PREGNANCY: Preg Test, Ur: NEGATIVE

## 2020-10-04 MED ORDER — PREDNISONE 20 MG PO TABS: 20.0000 mg | ORAL_TABLET | Freq: Every day | ORAL | 0 refills | Status: DC

## 2020-10-04 NOTE — Progress Notes (Signed)
Subjective:  Patient ID: Kristin Baker, female    DOB: 1990/12/15  Age: 30 y.o. MRN: [id number]  CC:  Chief Complaint  Patient presents with  . Follow-up    Had her IUD taken out, having her menstrual cycle for 10 days, extreme fatigue, muscle and joint pain, stated her legs start shaking really bad and almost not able to walk and has happened off and on for 2 times, not feeling good, discuss disability, worried about nerve damage and discuss MS screening  . Other    Sterile pyuria, subclinical hypothyroidism, back pain, heavy menstrual bleeding      HPI  This patient arrives today for the above.  Sterile pyuria: She has a history of sterile pyuria from February 2022.  She is due to have urine checked today.  Subclinical hypothyroidism: She had her thyroid medication increased to 90 mg daily at last office visit.  She is due to have her thyroid panel checked today.  Back pain: Her back pain seems to be flaring up recently.  She is experiencing radiculopathy down her left and right legs.  Left is worse than right.  She has about a month ago she was standing up and doing dishes when she felt that her leg was about to give out on her and so she went to the chair and sat down.  This again occurred about a week later.  She does have a paternal aunt he has a history of MS and she is concerned that she may have MS as well.  Overall she has decent sensation and strength in her legs, but does admit to feelings of muscle shaking and spasming during these episodes.  She does have history of MRI of the brain that was completed back in December 2021 which showed a nonspecific focus of T2 hyperintensity in the right periventricular white matter.  I did speak out to a neurology peer member, and they tell me that this is nonspecific but we could consider doing repeat MRI of the brain with MS protocol in about 6 months for further evaluation.  Heavy menstrual bleeding: She has had her IUD removed in  preparation to try and conceive.  She tells me that she is been having a period for the last 10 days which is abnormal for her.  Past Medical History:  Diagnosis Date  . Abdominal pain   . ADHD   . ANA positive   . Back pain   . DDD (degenerative disc disease), lumbar   . Eye pain, right   . Fatigue   . Frontal sinusitis   . Hypertension   . Hypothyroidism   . Obesity, morbid ([place])   . Palpitations   . Rheumatoid arthritis ([place])       Family History  Problem Relation Age of Onset  . Hyperlipidemia Mother   . Peripheral Artery Disease Mother   . ADD / ADHD Mother   . Bipolar disorder Mother   . Cervical cancer Mother   . Diabetes Father   . Heart attack Father   . ADD / ADHD Sister   . Healthy Daughter   . ADD / ADHD Maternal Aunt     Social History   Social History Narrative   Married for May 2021.Lives with husband and daughter.Homemaker.Husband works at [employer].   Social History   Tobacco Use  . Smoking status: Current Every Day Smoker    Packs/day: 1.00    Years: 15.00    Pack  years: 15.00    Types: Cigarettes  . Smokeless tobacco: Never Used  . Tobacco comment: working with PCP on this  Substance Use Topics  . Alcohol use: Yes    Comment: occasionally /social events     Current Meds  Medication Sig  . hydrochlorothiazide (HYDRODIURIL) 12.5 MG tablet TAKE 1 TABLET(12.5 MG) BY MOUTH DAILY  . Multiple Vitamins-Minerals (MULTI-VITAMIN GUMMIES PO) Take 1 Dose by mouth daily.  . NP THYROID 90 MG tablet Take 1 tablet (90 mg total) by mouth daily.  . predniSONE (DELTASONE) 20 MG tablet Take 1 tablet (20 mg total) by mouth daily with breakfast.  . [DISCONTINUED] ibuprofen (ADVIL) 200 MG tablet Take 800 mg by mouth as needed.  . [DISCONTINUED] Liraglutide -Weight Management (SAXENDA) 18 MG/3ML SOPN Inject 0.6 mg into the skin daily.  . [DISCONTINUED] traMADol (ULTRAM) 50 MG tablet Take 1 tablet (50 mg total) by mouth every 6 (six) hours as  needed for moderate pain.    ROS:  See HPI  Objective:   Today's Vitals: BP 132/78   Pulse 75   Temp 97.7 F (36.5 C) (Temporal)   Ht '5\' 7"'  (1.702 m)   Wt 287 lb 12.8 oz (130.5 kg)   SpO2 99%   BMI 45.08 kg/m  Vitals with BMI 10/04/2020 09/18/2020 09/06/2020  Height '5\' 7"'  '5\' 7"'  '5\' 7"'   Weight 287 lbs 13 oz 287 lbs 3 oz 283 lbs  BMI 45.07 29.19 16.60  Systolic 600 459 -  Diastolic 78 88 -  Pulse 75 78 -     Physical Exam Vitals reviewed.  Constitutional:      General: She is not in acute distress.    Appearance: Normal appearance.  HENT:     Head: Normocephalic and atraumatic.  Neck:     Vascular: No carotid bruit.  Cardiovascular:     Rate and Rhythm: Normal rate and regular rhythm.     Pulses: Normal pulses.     Heart sounds: Normal heart sounds.  Pulmonary:     Effort: Pulmonary effort is normal.     Breath sounds: Normal breath sounds.  Musculoskeletal:     Thoracic back: Tenderness present.     Lumbar back: Tenderness present. Positive right straight leg raise test and positive left straight leg raise test.  Skin:    General: Skin is warm and dry.  Neurological:     General: No focal deficit present.     Mental Status: She is alert and oriented to person, place, and time.     Sensory: Sensation is intact.     Motor: Motor function is intact.     Gait: Gait is intact.     Deep Tendon Reflexes:     Reflex Scores:      Patellar reflexes are 0 on the right side and 0 on the left side. Psychiatric:        Mood and Affect: Mood normal.        Behavior: Behavior normal.        Judgment: Judgment normal.      POC Pregnancy: negative    Assessment and Plan   1. Sterile pyuria   2. Subclinical hypothyroidism   3. Midline low back pain with left-sided sciatica, unspecified chronicity   4. Encounter for pregnancy test, result unknown   5. T2 hyperintense foci present in cerebral cortex on magnetic resonance imaging   6. Weakness of both lower extremities    7. Excessive bleeding in premenopausal period  Plan: 1.  We will recheck urine for sterile pyuria.  If present may send to urology. 2.  We will check thyroid panel for further evaluation today. 3., 4.  Pregnancy ruled out, will place patient on short course of steroids to see if this helps with the low back pain.  Also refer her to neurosurgery as she would like a second opinion regarding her back pain and possible treatments for this. 5.,  6.  I did not notice any objective weakness on legs today, however will get repeat MRI with MS protocol for further evaluation. 7.  I recommend she follow-up with OB/GYN to see if the heavy bleeding can be expected with IUD removal or if she needs further evaluation.  She tells me he understands.   Tests ordered Orders Placed This Encounter  Procedures  . MR MRV HEAD W WO CONTRAST  . Urinalysis with Culture Reflex  . TSH  . T3, Free  . T4, Free  . CMP with eGFR([facility])  . Ambulatory referral to Neurosurgery  . POCT urine pregnancy      Meds ordered this encounter  Medications  . predniSONE (DELTASONE) 20 MG tablet    Sig: Take 1 tablet (20 mg total) by mouth daily with breakfast.    Dispense:  10 tablet    Refill:  0    Order Specific Question:   Supervising Provider    Answer:   Doree Albee [[id number]]    Patient to follow-up in 2 months or sooner as needed.  Ailene Ards, NP

## 2020-10-04 NOTE — Telephone Encounter (Signed)
MRI of brain with and without contrast ordered today, order notes indicate this is to be done with MS protocol.

## 2020-10-05 LAB — URINALYSIS W MICROSCOPIC + REFLEX CULTURE
Bacteria, UA: NONE SEEN /HPF
Bilirubin Urine: NEGATIVE
Glucose, UA: NEGATIVE
Hyaline Cast: NONE SEEN /LPF
Ketones, ur: NEGATIVE
Leukocyte Esterase: NEGATIVE
Nitrites, Initial: NEGATIVE
Protein, ur: NEGATIVE
RBC / HPF: NONE SEEN /HPF (ref 0–2)
Specific Gravity, Urine: 1.004 (ref 1.001–1.035)
WBC, UA: NONE SEEN /HPF (ref 0–5)
pH: 7.5 (ref 5.0–8.0)

## 2020-10-05 LAB — COMPLETE METABOLIC PANEL WITH GFR
AG Ratio: 1.4 (calc) (ref 1.0–2.5)
ALT: 11 U/L (ref 6–29)
AST: 11 U/L (ref 10–30)
Albumin: 4.2 g/dL (ref 3.6–5.1)
Alkaline phosphatase (APISO): 79 U/L (ref 31–125)
BUN/Creatinine Ratio: 8 (calc) (ref 6–22)
BUN: 6 mg/dL — ABNORMAL LOW (ref 7–25)
CO2: 27 mmol/L (ref 20–32)
Calcium: 9.2 mg/dL (ref 8.6–10.2)
Chloride: 103 mmol/L (ref 98–110)
Creat: 0.76 mg/dL (ref 0.50–1.10)
GFR, Est African American: 122 mL/min/{1.73_m2} (ref >=60)
GFR, Est Non African American: 105 mL/min/{1.73_m2} (ref >=60)
Globulin: 2.9 g/dL (calc) (ref 1.9–3.7)
Glucose, Bld: 78 mg/dL (ref 65–139)
Potassium: 4.3 mmol/L (ref 3.5–5.3)
Sodium: 139 mmol/L (ref 135–146)
Total Bilirubin: 0.4 mg/dL (ref 0.2–1.2)
Total Protein: 7.1 g/dL (ref 6.1–8.1)

## 2020-10-05 LAB — TSH: TSH: 4.8 mIU/L — ABNORMAL HIGH

## 2020-10-05 LAB — NO CULTURE INDICATED

## 2020-10-05 LAB — T4, FREE: Free T4: 0.9 ng/dL (ref 0.8–1.8)

## 2020-10-05 LAB — T3, FREE: T3, Free: 2.5 pg/mL (ref 2.3–4.2)

## 2020-10-05 NOTE — Telephone Encounter (Signed)
Approved 7 appt is set in 2 wks

## 2020-10-16 ENCOUNTER — Other Ambulatory Visit (INDEPENDENT_AMBULATORY_CARE_PROVIDER_SITE_OTHER): Payer: Self-pay | Admitting: Nurse Practitioner

## 2020-10-16 DIAGNOSIS — E039 Hypothyroidism, unspecified: Secondary | ICD-10-CM

## 2020-10-19 ENCOUNTER — Ambulatory Visit (HOSPITAL_COMMUNITY): Admission: RE | Admit: 2020-10-19 | Payer: Medicaid Other | Source: Ambulatory Visit

## 2020-10-31 ENCOUNTER — Other Ambulatory Visit (INDEPENDENT_AMBULATORY_CARE_PROVIDER_SITE_OTHER): Payer: Self-pay | Admitting: Nurse Practitioner

## 2020-10-31 DIAGNOSIS — R9089 Other abnormal findings on diagnostic imaging of central nervous system: Secondary | ICD-10-CM

## 2020-10-31 DIAGNOSIS — G35 Multiple sclerosis: Secondary | ICD-10-CM

## 2020-10-31 DIAGNOSIS — R29898 Other symptoms and signs involving the musculoskeletal system: Secondary | ICD-10-CM

## 2020-11-01 ENCOUNTER — Ambulatory Visit (HOSPITAL_COMMUNITY): Payer: Medicaid Other

## 2020-11-02 ENCOUNTER — Telehealth (INDEPENDENT_AMBULATORY_CARE_PROVIDER_SITE_OTHER): Payer: Self-pay

## 2020-11-06 ENCOUNTER — Ambulatory Visit: Payer: Medicaid Other | Admitting: Nutrition

## 2020-11-07 NOTE — Telephone Encounter (Signed)
Called to cancel appt; due to the insurance is told pt to have completed at [place] facilities.

## 2020-11-08 NOTE — Telephone Encounter (Signed)
I sent to them for her testing. They are going to contact her. I can call and see if she has went to  her appointment.

## 2020-11-29 ENCOUNTER — Telehealth (INDEPENDENT_AMBULATORY_CARE_PROVIDER_SITE_OTHER): Payer: Self-pay

## 2020-11-29 ENCOUNTER — Ambulatory Visit (INDEPENDENT_AMBULATORY_CARE_PROVIDER_SITE_OTHER): Payer: Medicaid Other | Admitting: Nurse Practitioner

## 2020-11-29 NOTE — Telephone Encounter (Signed)
Called patient and rescheduled her appointment this morning for 01/03/2021. Patient verbalized an understanding.

## 2020-11-29 NOTE — Telephone Encounter (Signed)
Patient called today and stated that she has not been able to receive her MRI due to insurance and she stated that she should be able to go to [facility]  [address], [place], [place] [address] [phone]  I can call to see if I can fax them the information and patient is asking if she needs to come in still or does she need to postpone her appointment today since she has not had her MRI? Her husband is being tested for covid due to exposure and can do a telephone call also if need be.   Please advise.

## 2020-11-29 NOTE — Telephone Encounter (Signed)
So pt called today to [facility] , asking when her appointment was for MRI Brain?  I return called and explained of the information I given heron last called back over a month ago. Pt was re read the instructions  & phone numbers to call to see when she could be seen.  She agreed to the information I did give her. She had forgotten stated. So she will try again next business day to f/u to schedule.

## 2020-12-04 ENCOUNTER — Ambulatory Visit: Payer: Medicaid Other | Admitting: Nutrition

## 2020-12-12 ENCOUNTER — Other Ambulatory Visit (HOSPITAL_COMMUNITY): Payer: Self-pay | Admitting: Neurological Surgery

## 2020-12-12 DIAGNOSIS — M4802 Spinal stenosis, cervical region: Secondary | ICD-10-CM

## 2020-12-26 ENCOUNTER — Ambulatory Visit (HOSPITAL_COMMUNITY): Admission: RE | Admit: 2020-12-26 | Payer: Medicaid Other | Source: Ambulatory Visit

## 2021-01-03 ENCOUNTER — Ambulatory Visit (INDEPENDENT_AMBULATORY_CARE_PROVIDER_SITE_OTHER): Payer: Medicaid Other | Admitting: Nurse Practitioner

## 2021-01-03 ENCOUNTER — Encounter (INDEPENDENT_AMBULATORY_CARE_PROVIDER_SITE_OTHER): Payer: Self-pay | Admitting: Nurse Practitioner

## 2021-01-03 ENCOUNTER — Other Ambulatory Visit: Payer: Self-pay

## 2021-01-03 VITALS — BP 124/82 | HR 94 | Temp 97.4°F | Ht 67.0 in | Wt 294.6 lb

## 2021-01-03 DIAGNOSIS — I10 Essential (primary) hypertension: Secondary | ICD-10-CM | POA: Diagnosis not present

## 2021-01-03 DIAGNOSIS — R5383 Other fatigue: Secondary | ICD-10-CM

## 2021-01-03 DIAGNOSIS — R9089 Other abnormal findings on diagnostic imaging of central nervous system: Secondary | ICD-10-CM | POA: Diagnosis not present

## 2021-01-03 DIAGNOSIS — F32A Depression, unspecified: Secondary | ICD-10-CM | POA: Diagnosis not present

## 2021-01-03 DIAGNOSIS — E038 Other specified hypothyroidism: Secondary | ICD-10-CM

## 2021-01-03 MED ORDER — NP THYROID 90 MG PO TABS
90.0000 mg | ORAL_TABLET | Freq: Every day | ORAL | 0 refills | Status: DC
Start: 2021-01-03 — End: 2021-05-14

## 2021-01-03 MED ORDER — HYDROCHLOROTHIAZIDE 12.5 MG PO TABS: 12.5000 mg | ORAL_TABLET | Freq: Every day | ORAL | 0 refills | Status: DC

## 2021-01-03 NOTE — Progress Notes (Signed)
Subjective:  Patient ID: Kristin Baker, female    DOB: 10-30-1990  Age: 30 y.o. MRN: [id number]  CC:  Chief Complaint  Patient presents with   Follow-up    Having severe depression during summer, needs refills   Hypertension   Depression   Other    Intermittent weakness, subclinical hypothyroidism      HPI  This patient arrives today for the above.  Hypertension: She continues on hydrochlorothiazide and is tolerating this well.  She is asking for refill on this medication.  Depression: She tells me she gets seasonal affect disorder in the summertime, but tells me that over the last couple days her mood has improved and she thinks it is coming to an end.  She does not want referral to psychiatry at this time.  Intermittent weakness: She is being evaluated by neurosurgery and is scheduled for MRI of the cervical spine later this week.  I have referred her for MRI of the brain with MS protocol due to historical finding of T2 hyperintense foci on previous MRI of the brain.  Due to insurance approval issues this MRI has not been conducted.  It was approved to be done at a [facility] facility but the patient has not gone to have the scan completed.  She continues to feel like her grip in her right hand is intermittently weak.  Subclinical hypothyroidism: She also continues on NP thyroid 90 mg by mouth daily for treatment of her subclinical hypothyroidism.  She continues to feel fatigued.  Last thyroid panel was collected earlier this year and showed slightly elevated TSH but normal T3 and T4.  Past Medical History:  Diagnosis Date   Abdominal pain    ADHD    ANA positive    Back pain    DDD (degenerative disc disease), lumbar    Eye pain, right    Fatigue    Frontal sinusitis    Hypertension    Hypothyroidism    Obesity, morbid (HCC)    Palpitations    Rheumatoid arthritis (HCC)       Family History  Problem Relation Age of Onset   Hyperlipidemia Mother    Peripheral  Artery Disease Mother    ADD / ADHD Mother    Bipolar disorder Mother    Cervical cancer Mother    Diabetes Father    Heart attack Father    ADD / ADHD Sister    Healthy Daughter    ADD / ADHD Maternal Aunt     Social History   Social History Narrative   Married for May 2021.Lives with husband and daughter.Homemaker.Husband works at [employer].   Social History   Tobacco Use   Smoking status: Every Day    Packs/day: 1.00    Years: 15.00    Pack years: 15.00    Types: Cigarettes   Smokeless tobacco: Never   Tobacco comments:    working with PCP on this  Substance Use Topics   Alcohol use: Yes    Comment: occasionally /social events     Current Meds  Medication Sig   Multiple Vitamins-Minerals (MULTI-VITAMIN GUMMIES PO) Take 1 Dose by mouth daily.   [DISCONTINUED] hydrochlorothiazide (HYDRODIURIL) 12.5 MG tablet TAKE 1 TABLET(12.5 MG) BY MOUTH DAILY   [DISCONTINUED] NP THYROID 90 MG tablet Take 1 tablet (90 mg total) by mouth daily.    ROS:  Review of Systems  Constitutional:  Negative for fever.  Eyes:  Negative for blurred vision.  Respiratory:  Negative for shortness of breath.   Cardiovascular:  Negative for chest pain.  Neurological:  Positive for weakness (right hand (dropping things more frequently) intermittently).       (+) muscle spasms intermittently    Objective:   Today's Vitals: BP 124/82   Pulse 94   Temp (!) 97.4 F (36.3 C) (Temporal)   Ht 5\' 7"  (1.702 m)   Wt 294 lb 9.6 oz (133.6 kg)   SpO2 99%   BMI 46.14 kg/m  Vitals with BMI 01/03/2021 10/04/2020 09/18/2020  Height 5\' 7"  5\' 7"  5\' 7"   Weight 294 lbs 10 oz 287 lbs 13 oz 287 lbs 3 oz  BMI 46.13 45.07 44.97  Systolic 124 132 132  Diastolic 82 78 88  Pulse 94 75 78     Physical Exam Vitals reviewed.  Constitutional:      General: She is not in acute distress.    Appearance: Normal appearance.  HENT:     Head: Normocephalic and atraumatic.  Neck:     Vascular: No  carotid bruit.  Cardiovascular:     Rate and Rhythm: Normal rate and regular rhythm.     Pulses: Normal pulses.     Heart sounds: Normal heart sounds.  Pulmonary:     Effort: Pulmonary effort is normal.     Breath sounds: Normal breath sounds.  Skin:    General: Skin is warm and dry.  Neurological:     General: No focal deficit present.     Mental Status: She is alert and oriented to person, place, and time.  Psychiatric:        Mood and Affect: Mood normal.        Behavior: Behavior normal.        Judgment: Judgment normal.         Assessment and Plan   1. T2 hyperintense foci present in cerebral cortex on magnetic resonance imaging   2. Hypertension, unspecified type   3. Depression, unspecified depression type   4. Fatigue, unspecified type   5. Subclinical hypothyroidism      Plan: 1.  I encouraged her again to try to get the MRI of the brain done soon as possible at the [facility] facility for which it was approved.  She will consider doing this.  Because our office is closing at the end of this month permanently, I will also refer her to neurology so this can be followed up in the event that her MRI is not completed prior to is closing. 2.  We will refill her hydrochlorothiazide today. 3.  As stated above she does not want referral to psychiatry. 4.,  5.  She will continue on her NP thyroid as currently prescribed.   Tests ordered Orders Placed This Encounter  Procedures   Ambulatory referral to Neurology      Meds ordered this encounter  Medications   hydrochlorothiazide (HYDRODIURIL) 12.5 MG tablet    Sig: Take 1 tablet (12.5 mg total) by mouth daily.    Dispense:  90 tablet    Refill:  0    Order Specific Question:   Supervising Provider    Answer:   Kristin Baker [[id number]]   NP THYROID 90 MG tablet    Sig: Take 1 tablet (90 mg total) by mouth daily.    Dispense:  90 tablet    Refill:  0    Order Specific Question:   Supervising Provider    Answer:    Kristin Baker [id number]  Patient scheduled for follow-up as this office is closing permanently as of 01/24/21 due to the passing of Dr. Karilyn Baker.  The patient was notified of this and that they will need to find a new primary care provider.  They express understanding.   Kristin Paddy, NP

## 2021-01-03 NOTE — Patient Instructions (Signed)
Patient Engagement Center: [phone]

## 2021-01-04 ENCOUNTER — Ambulatory Visit (HOSPITAL_COMMUNITY)
Admission: RE | Admit: 2021-01-04 | Discharge: 2021-01-04 | Disposition: A | Discharge status: Home / Self Care | Payer: Medicaid Other | Source: Ambulatory Visit | Attending: Neurological Surgery | Admitting: Neurological Surgery

## 2021-01-04 DIAGNOSIS — M4802 Spinal stenosis, cervical region: Secondary | ICD-10-CM | POA: Diagnosis not present

## 2021-01-04 IMAGING — MR MR CERVICAL SPINE W/O CM
5 series · 39 of 48 positions shown · non-contrast
Comparison: None available.

CLINICAL DATA: Initial evaluation for bilateral arm and leg
numbness for 3 months.

EXAM:
MRI CERVICAL SPINE WITHOUT CONTRAST
TECHNIQUE: Multiplanar, multisequence MR imaging of the cervical spine was
performed. No intravenous contrast was administered.

[Series 5: T2 · sagittal · 3.0mm · 0.69mm/px · 6 of 15 slices shown (1 of 2)]
[im 1/15]
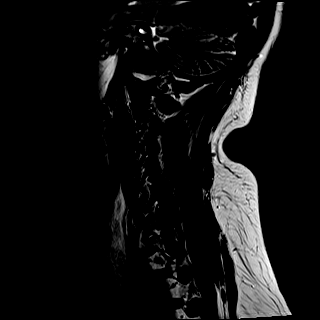
[im 3/15]
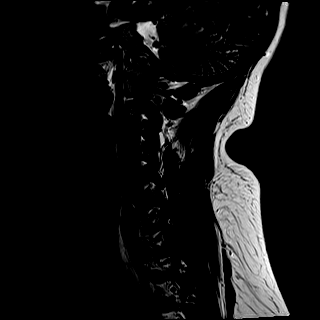
[im 6/15]
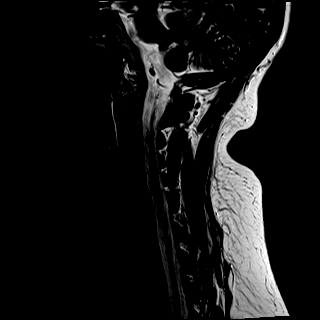
[im 9/15]
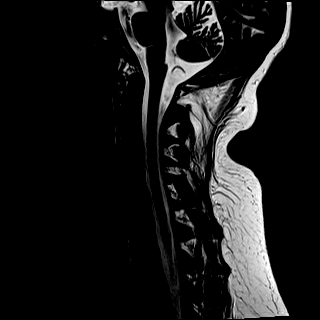
[im 12/15]
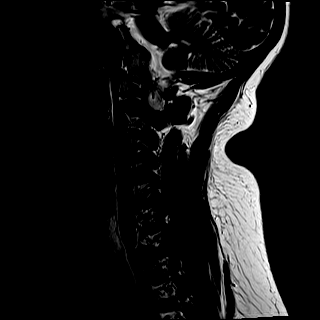
[im 15/15]
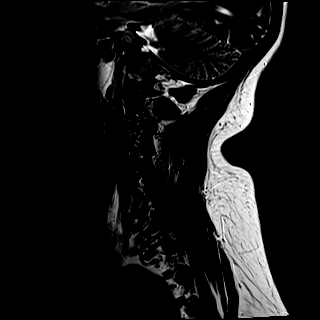

[Series 6: T1 · sagittal · 3.0mm · 0.86mm/px · 6 of 15 slices shown]
[im 1/15]
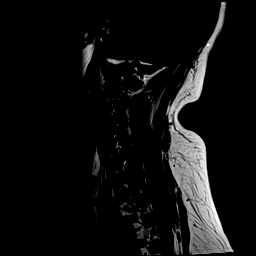
[im 3/15]
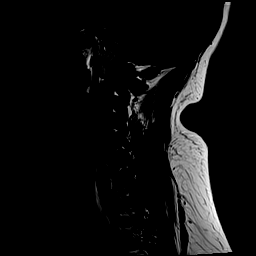
[im 6/15]
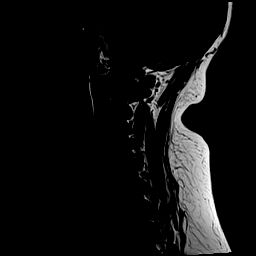
[im 9/15]
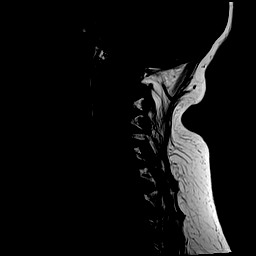
[im 12/15]
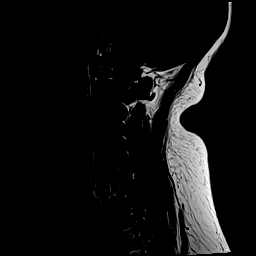
[im 15/15]
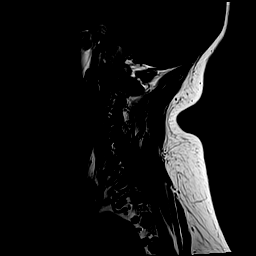

[Series 7: STIR · sagittal · 3.0mm · 0.69mm/px · 6 of 15 slices shown]
[im 1/15]
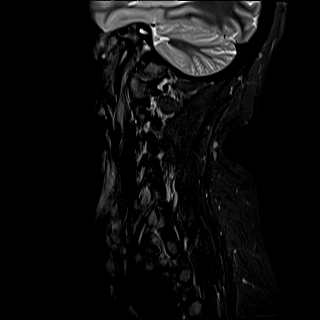
[im 3/15]
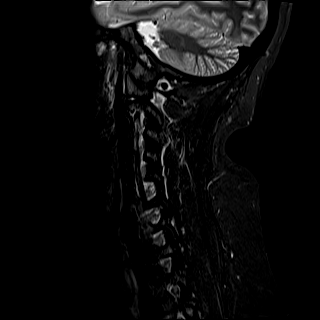
[im 6/15]
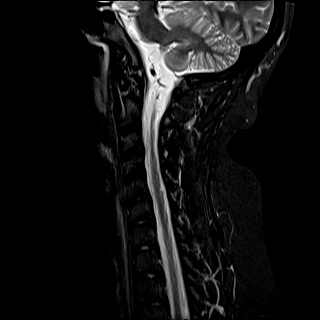
[im 9/15]
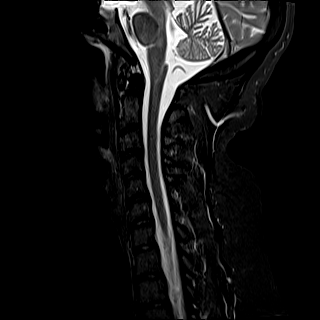
[im 12/15]
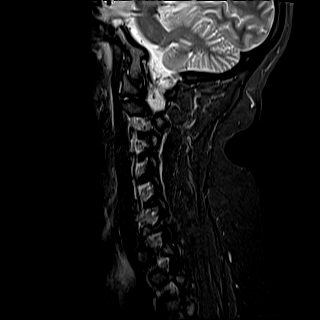
[im 15/15]
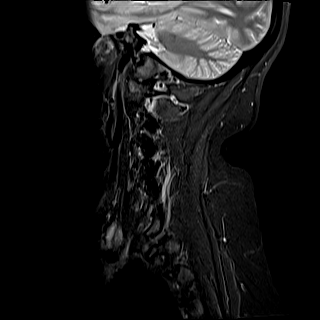

[Series 8: T2 · axial · 3.0mm · 0.70mm/px · z∈[-129,-15]mm · 13 of 36 slices shown (2 of 2)]
[im 1/36]
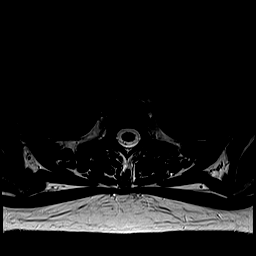
[im 3/36]
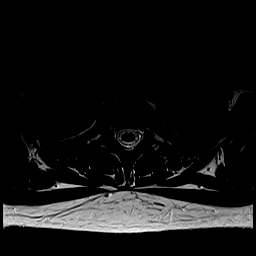
[im 6/36]
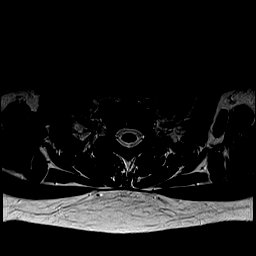
[im 8/36]
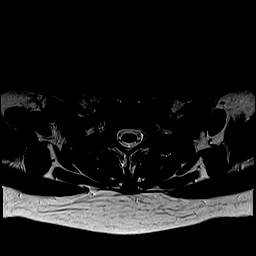
[im 11/36]
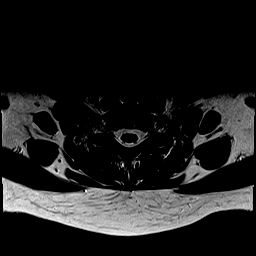
[im 13/36]
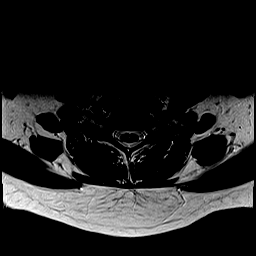
[im 16/36]
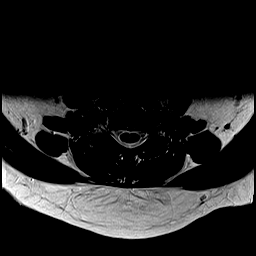
[im 18/36]
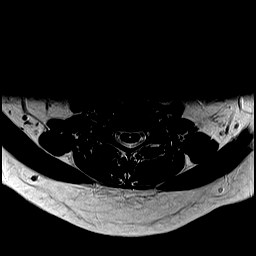
[im 21/36]
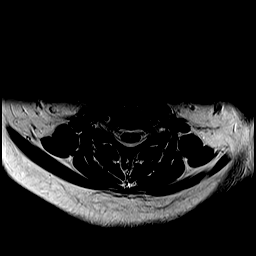
[im 23/36]
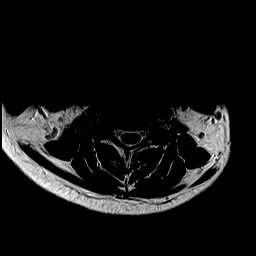
[im 26/36]
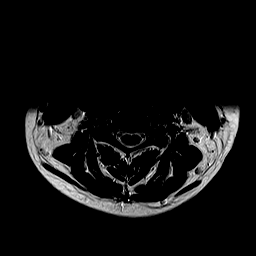
[im 31/36]
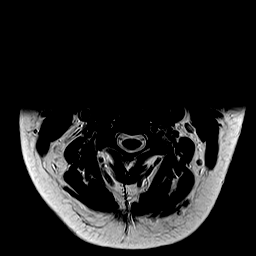
[im 36/36]
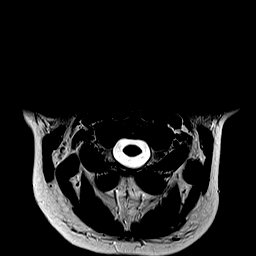

[Series 9: GRE · axial · 3.0mm · 0.35mm/px · z∈[-129,-15]mm · 8 of 36 slices shown]
[im 1/36]
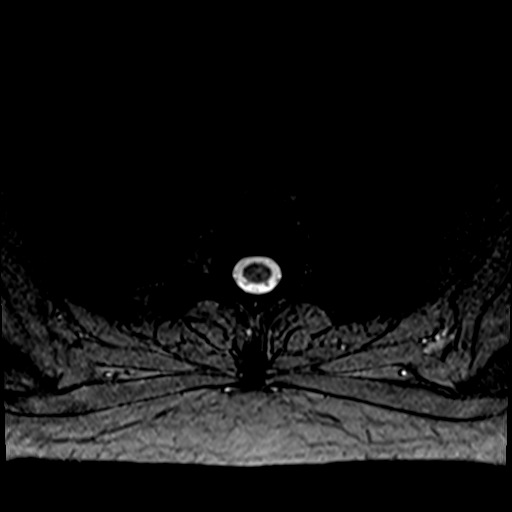
[im 6/36]
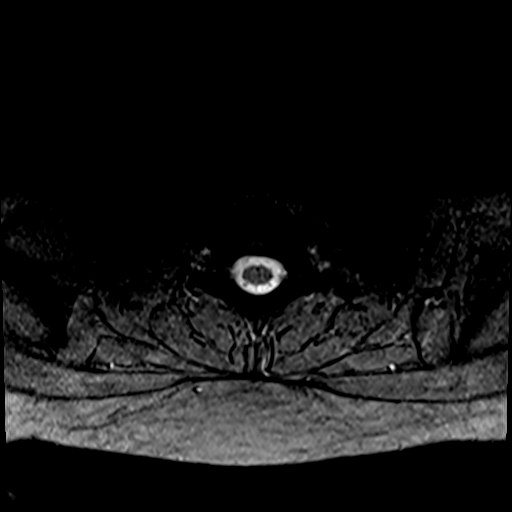
[im 11/36]
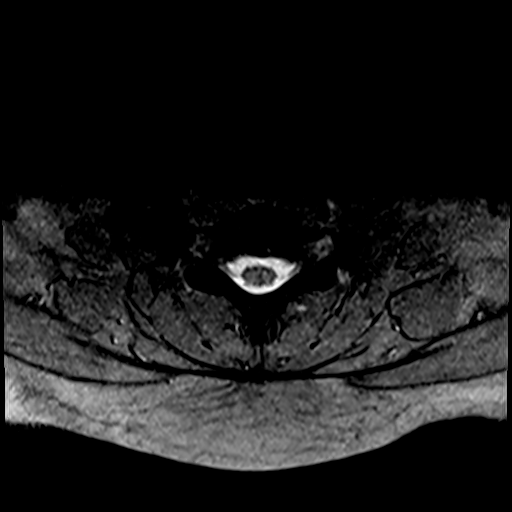
[im 16/36]
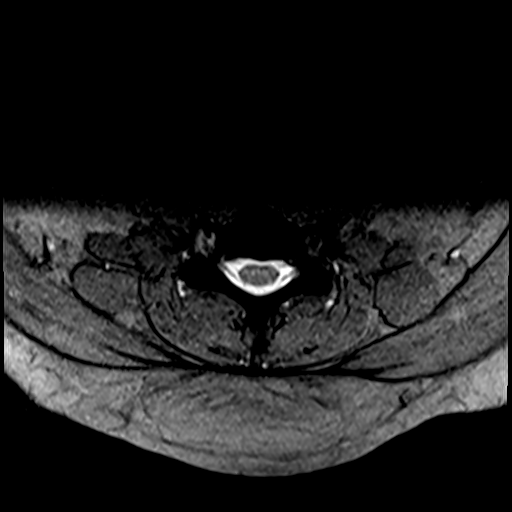
[im 21/36]
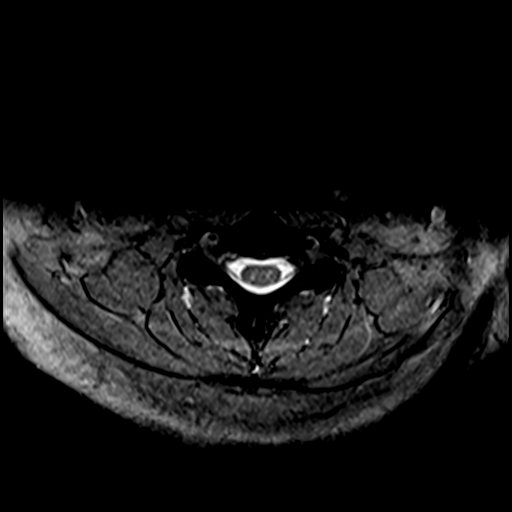
[im 26/36]
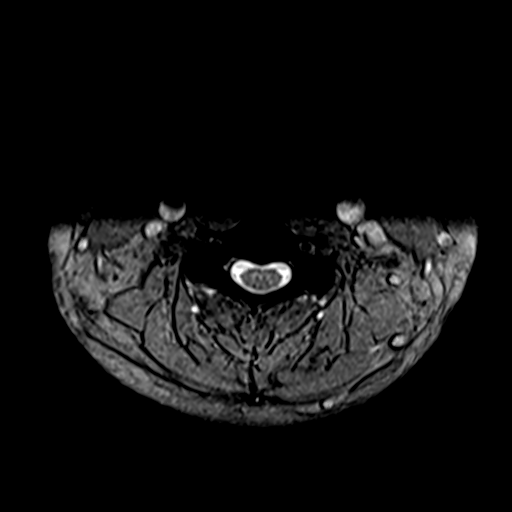
[im 31/36]
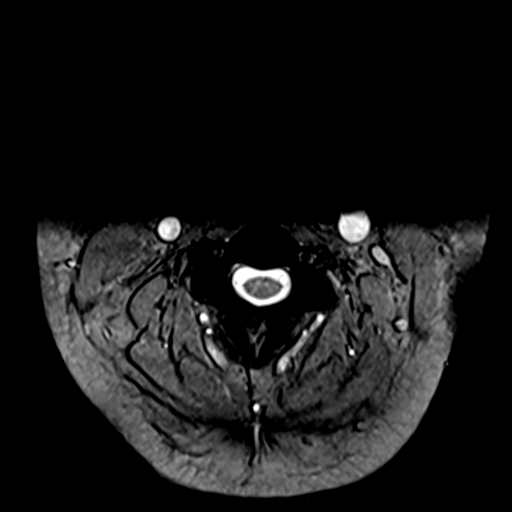
[im 36/36]
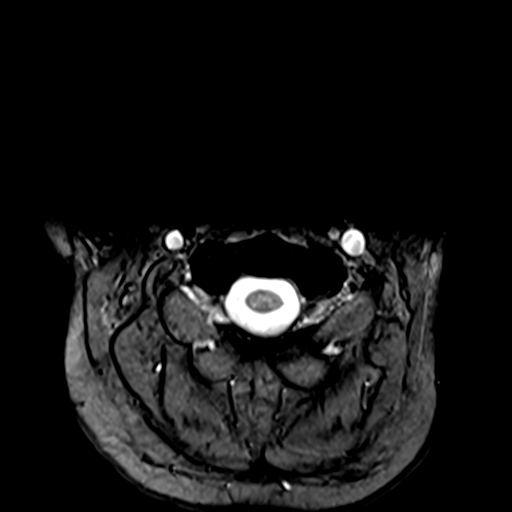

[39 of 48 positions shown; findings below may reference images not displayed]

FINDINGS: Alignment: Straightening of the normal cervical lordosis. Trace
anterolisthesis of C5 on C6, likely chronic and degenerative.

Vertebrae: Vertebral body height maintained without acute or chronic
fracture. Bone marrow signal intensity within normal limits without
discrete or worrisome osseous lesions. No abnormal marrow edema.

Cord: Normal signal and morphology. Minimal prominence of the
central canal noted at the C5-6 level without frank syrinx.

Posterior Fossa, vertebral arteries, paraspinal tissues: Visualized
brain and posterior fossa within normal limits. Craniocervical
junction normal. Paraspinous and prevertebral soft tissues within
normal limits. Normal intravascular flow voids seen within the
vertebral arteries bilaterally.

Disc levels:

C2-C3: Normal interspace. Mild left-sided facet hypertrophy. No
canal or foraminal stenosis.

C3-C4: Minimal disc bulge with uncovertebral hypertrophy. No spinal
stenosis. Foramina remain widely patent.

C4-C5:  Unremarkable.

C5-C6: Trace anterolisthesis. Mild disc bulge with bilateral
uncovertebral hypertrophy. No spinal stenosis. Foramina remain
widely patent.

C6-C7: Minimal disc bulge with uncovertebral spurring. No spinal
stenosis. Foramina remain widely patent.

C7-T1:  Unremarkable.

Visualized upper thoracic spine demonstrates no significant finding.
IMPRESSION: 1. Minimal noncompressive disc bulging at C3-4 through C6-7 without
stenosis or neural impingement.
2. Otherwise unremarkable MRI of the cervical spine.

## 2021-01-15 ENCOUNTER — Ambulatory Visit: Payer: Medicaid Other | Admitting: Neurology

## 2021-01-15 ENCOUNTER — Encounter: Payer: Self-pay | Admitting: Neurology

## 2021-01-15 VITALS — BP 143/91 | HR 84 | Ht 67.0 in | Wt 287.0 lb

## 2021-01-15 DIAGNOSIS — G43009 Migraine without aura, not intractable, without status migrainosus: Secondary | ICD-10-CM

## 2021-01-15 DIAGNOSIS — E559 Vitamin D deficiency, unspecified: Secondary | ICD-10-CM | POA: Insufficient documentation

## 2021-01-15 DIAGNOSIS — R519 Headache, unspecified: Secondary | ICD-10-CM | POA: Insufficient documentation

## 2021-01-15 DIAGNOSIS — R9082 White matter disease, unspecified: Secondary | ICD-10-CM | POA: Insufficient documentation

## 2021-01-15 DIAGNOSIS — G43709 Chronic migraine without aura, not intractable, without status migrainosus: Secondary | ICD-10-CM | POA: Diagnosis not present

## 2021-01-15 MED ORDER — TOPIRAMATE 50 MG PO TABS: 50.0000 mg | ORAL_TABLET | Freq: Two times a day (BID) | ORAL | 5 refills | Status: DC

## 2021-01-15 MED ORDER — RIZATRIPTAN BENZOATE 10 MG PO TBDP: 10.0000 mg | ORAL_TABLET | ORAL | 11 refills | Status: DC | PRN
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
## 2021-01-17 ENCOUNTER — Encounter (INDEPENDENT_AMBULATORY_CARE_PROVIDER_SITE_OTHER): Payer: Self-pay | Admitting: Nurse Practitioner

## 2021-03-27 ENCOUNTER — Ambulatory Visit: Payer: Medicaid Other | Admitting: Nurse Practitioner

## 2021-03-30 ENCOUNTER — Telehealth: Payer: Self-pay | Admitting: Obstetrics & Gynecology

## 2021-03-30 NOTE — Telephone Encounter (Signed)
Patient is calling stating that she had her IUD removed back in April her cycle was fine but now she hasn't had a cycle since August has had a negative pregnancy test. Patient is a little concern wanting to know what to do next

## 2021-04-02 NOTE — Telephone Encounter (Signed)
Patient states she has not had a period since early August.  She has taken the most recent pregnancy test this past Friday in which it was negative.  Periods since having the IUD removed have been normal and on time.  States she has not changed anything in her diet, lifestyle,etc that might would affect her not having a cycle.  She however has not had her TSH checked in about 6 months and wonders if she needs an adjustment to her thyroid medication. Informed patient any change in thyroid may cause issues with her cycle.  Does have an appointment with PCP in early December.  Will have this checked and let us know.

## 2021-04-05 ENCOUNTER — Other Ambulatory Visit (INDEPENDENT_AMBULATORY_CARE_PROVIDER_SITE_OTHER): Payer: Self-pay | Admitting: Nurse Practitioner

## 2021-04-05 DIAGNOSIS — R5383 Other fatigue: Secondary | ICD-10-CM

## 2021-04-05 DIAGNOSIS — I10 Essential (primary) hypertension: Secondary | ICD-10-CM

## 2021-05-14 ENCOUNTER — Ambulatory Visit (INDEPENDENT_AMBULATORY_CARE_PROVIDER_SITE_OTHER): Payer: Medicaid Other | Admitting: Nurse Practitioner

## 2021-05-14 ENCOUNTER — Other Ambulatory Visit: Payer: Self-pay

## 2021-05-14 ENCOUNTER — Ambulatory Visit: Payer: Medicaid Other | Admitting: Nurse Practitioner

## 2021-05-14 ENCOUNTER — Encounter: Payer: Self-pay | Admitting: Nurse Practitioner

## 2021-05-14 VITALS — BP 145/84 | HR 81 | Ht 67.0 in | Wt 299.0 lb

## 2021-05-14 DIAGNOSIS — I10 Essential (primary) hypertension: Secondary | ICD-10-CM

## 2021-05-14 DIAGNOSIS — G8929 Other chronic pain: Secondary | ICD-10-CM | POA: Insufficient documentation

## 2021-05-14 DIAGNOSIS — F32A Depression, unspecified: Secondary | ICD-10-CM

## 2021-05-14 DIAGNOSIS — Z889 Allergy status to unspecified drugs, medicaments and biological substances status: Secondary | ICD-10-CM

## 2021-05-14 DIAGNOSIS — R5383 Other fatigue: Secondary | ICD-10-CM | POA: Diagnosis not present

## 2021-05-14 DIAGNOSIS — T7840XA Allergy, unspecified, initial encounter: Secondary | ICD-10-CM | POA: Diagnosis not present

## 2021-05-14 DIAGNOSIS — M5136 Other intervertebral disc degeneration, lumbar region: Secondary | ICD-10-CM

## 2021-05-14 DIAGNOSIS — E039 Hypothyroidism, unspecified: Secondary | ICD-10-CM

## 2021-05-14 DIAGNOSIS — Z139 Encounter for screening, unspecified: Secondary | ICD-10-CM

## 2021-05-14 DIAGNOSIS — M545 Low back pain, unspecified: Secondary | ICD-10-CM

## 2021-05-14 DIAGNOSIS — M51369 Other intervertebral disc degeneration, lumbar region without mention of lumbar back pain or lower extremity pain: Secondary | ICD-10-CM

## 2021-05-14 DIAGNOSIS — E559 Vitamin D deficiency, unspecified: Secondary | ICD-10-CM

## 2021-05-14 DIAGNOSIS — G43709 Chronic migraine without aura, not intractable, without status migrainosus: Secondary | ICD-10-CM

## 2021-05-14 DIAGNOSIS — F419 Anxiety disorder, unspecified: Secondary | ICD-10-CM

## 2021-05-14 MED ORDER — HYDROCHLOROTHIAZIDE 12.5 MG PO TABS: 12.5000 mg | ORAL_TABLET | Freq: Every day | ORAL | 0 refills | Status: DC

## 2021-05-14 MED ORDER — SERTRALINE HCL 50 MG PO TABS: 50.0000 mg | ORAL_TABLET | Freq: Every day | ORAL | 3 refills | Status: DC

## 2021-05-14 MED ORDER — NP THYROID 90 MG PO TABS: 90.0000 mg | ORAL_TABLET | Freq: Every day | ORAL | 0 refills | Status: DC

## 2021-05-14 NOTE — Assessment & Plan Note (Signed)
Takes OTC ibuprofen as needed.Would like a refer to [facility] where she previously had an injection for her pain.

## 2021-05-14 NOTE — Assessment & Plan Note (Addendum)
GAD score 21, PHQ score 24. Start Zoloft 50 mg daily , f/u on 6 weeks. Referral to psychiatrist, does not want a counselor denise suicidal ideation. Pt educated on the need to take medication daily and to not stop taking med abruptly, side effects of med discussed with pt.

## 2021-05-14 NOTE — Assessment & Plan Note (Signed)
Check thyroid level today.  NP thyroid 90mg  refilled

## 2021-05-14 NOTE — Assessment & Plan Note (Signed)
followed by neurology. Has an upcoming appointment in January. Takes Topamax, does  not tolerate  Rizatriptan and she would discuss this with her neurologist.

## 2021-05-14 NOTE — Assessment & Plan Note (Signed)
referral made to allergist.

## 2021-05-14 NOTE — Progress Notes (Signed)
° °  Kristin Baker     MRN: [id number]      DOB: 1990-08-07   HPI Kristin Baker is here to establish care. Previous pt orf Dr Rutherford Limerick. She can not remember when he last annual exam was, she is current with her PAP smear.   Multiple allergies, recently developed allergy to dog hair, Broke out in hives after using products from [place] [employer], sh had been using the same product for years without having any problem before now. she would like a referral to allergist.   Depression and anxiety: Pt c/o depression that started since her grandfather died in 2019-09-08. Her grandfather raised her and her siblings , she has nt been able to get over her depression since then. She has bad reaction to lexapro and wellbutrin. She has tried counseling but hs does not want to talk them again, would like a psychiatrist. She denies suicidal ideation or thoughts of harming anyone.  Chronic Migraine: followed by neurology, has reaction from rizatriptan. Has an appointment with neurology in January. MRI of spine and head done  Chronic bilateral low back pain: see [facility]. Takes injection for shoulders and hip, last seen last year. She would like a referral to them again, she has been having increased pain since the weather became cold. Takes ibuprofen as needed for her. Her pain is a 5/10 today.   Hypothyroidism. Ran out of her NP thyroid , she can not remember the last time she took med.   Palpitations; She was evaluated by cardiology once, had cardiology work up done ,she was asked to follow up as needed.   Positive ANA; She had a work up up done for rheumatoid arthritis due to discomfort in her hands and knee joints.She is currently not taking any med, she was asked to follow up if she developed new symptoms.   Chronic intermittent Diarrhea and nausea. Pt stated that the condition is intermittent, denies bloody stool or abdominal pain.    ROS Denies recent fever or chills. Denies sinus  pressure, nasal congestion, ear pain or sore throat. Denies chest congestion, productive cough or wheezing. Denies chest pains, palpitations and leg swelling Has chronic intermittent nausea, and diarrhea no constipation.   Denies dysuria, frequency, hesitancy or incontinence. Has bilateral ow back pain and bilateral shoulder pain joint pain, no swelling.  Has chronic migraine headaches, chronic numbness, or tingling of hands and feet Has depression, anxiety, no insomnia. Denies skin break down or rash.   PE  BP (!) 145/84 (BP Location: Left Arm, Patient Position: Sitting, Cuff Size: Large)    Pulse 81    Ht 5\' 7"  (1.702 m)    Wt 299 lb (135.6 kg)    LMP 12/25/2020 (Approximate)    SpO2 97%    BMI 46.83 kg/m   Patient alert and oriented and in no cardiopulmonary distress.  HEENT: No facial asymmetry, EOMI,     Neck supple .  Chest: Clear to auscultation bilaterally.  CVS: S1, S2 no murmurs, no S3.Regular rate.  ABD: Soft non tender.   Ext: No edema.   MS: Adequate ROM spine, shoulders, hips and knees.tenderness of bilateral shoulders and bilateral low low back on palpation and ROM, no swelling noted  Skin: Intact, no ulcerations or rash noted.  Psych: Good eye contact, normal affect. Memory intact not anxious or depressed appearing.  CNS: CN 2-12 intact, power,  normal throughout.no focal deficits noted.   Assessment & Plan

## 2021-05-14 NOTE — Assessment & Plan Note (Signed)
Wt Readings from Last 3 Encounters:  05/14/21 299 lb (135.6 kg)  01/15/21 287 lb (130.2 kg)  01/03/21 294 lb 9.6 oz (133.6 kg)  Importance of healthy food choices with portion control discussed as well as eating regularly within 12  hour window.   The need to choose clean green food 50%-75% of time is discussed as well as make water the primary drink and set a goal for 64 ounces daily.  Patient reeducated about the importance of committment to minimum of 150 minutes of exercise per week.  Three meals at set times with snacks allowed between meals but they must be fruit or vegetable.   Aim to eat  over 12 hour period  for example 7 am to 7 pm. Stop after your last meal of the day.

## 2021-05-14 NOTE — Assessment & Plan Note (Signed)
Check lab next visit.

## 2021-05-14 NOTE — Assessment & Plan Note (Signed)
DASH diet and commitment to daily physical activity for a minimum of 30 minutes discussed and encouraged, as a part of hypertension management. The importance of attaining a healthy weight is also discussed.  BP/Weight 05/14/2021 01/15/2021 01/03/2021 10/04/2020 09/18/2020 09/06/2020 2/71/5664  Systolic BP 830 322 019 924 155 - 161  Diastolic BP 84 91 82 78 88 - 78  Wt. (Lbs) 299 287 294.6 287.8 287.2 283 283  BMI 46.83 44.95 46.14 45.08 44.98 44.32 44.32   Ran out of her HTC[facility] Med refilled, check EGFR today.

## 2021-05-14 NOTE — Assessment & Plan Note (Signed)
BP/Weight 05/14/2021 01/15/2021 01/03/2021 10/04/2020 09/18/2020 09/06/2020 09/05/2020  Systolic BP 145 143 124 132 128 - 127  Diastolic BP 84 91 82 78 88 - 78  Wt. (Lbs) 299 287 294.6 287.8 287.2 283 283  BMI 46.83 44.95 46.14 45.08 44.98 44.32 44.32  Hydrochlorothiazide 12.5mg  refilled  DASH diet and commitment to daily physical activity for a minimum of 30 minutes discussed and encouraged, as a part of hypertension management. The importance of attaining a healthy weight is also discussed.  BP/Weight 05/14/2021 01/15/2021 01/03/2021 10/04/2020 09/18/2020 09/06/2020 09/05/2020  Systolic BP 145 143 124 132 128 - 127  Diastolic BP 84 91 82 78 88 - 78  Wt. (Lbs) 299 287 294.6 287.8 287.2 283 283  BMI 46.83 44.95 46.14 45.08 44.98 44.32 44.32

## 2021-05-14 NOTE — Patient Instructions (Addendum)
LABWORK  NEEDS TO BE DONE BETWEEN 3 TO 7 DAYS BEFORE YOUR NEXT SCEDULED  VISIT.  THIS WILL IMPROVE THE QUALITY OF YOUR CARE.   Start zoloft 50 mg daily for your depression.   It is important that you exercise regularly at least 30 minutes 5 times a week.  Think about what you will eat, plan ahead. Choose " clean, green, fresh or frozen" over canned, processed or packaged foods which are more sugary, salty and fatty. 70 to 75% of food eaten should be vegetables and fruit. Three meals at set times with snacks allowed between meals, but they must be fruit or vegetables. Aim to eat over a 12 hour period , example 7 am to 7 pm, and STOP after  your last meal of the day. Drink water,generally about 64 ounces per day, no other drink is as healthy. Fruit juice is best enjoyed in a healthy way, by EATING the fruit.  Thanks for choosing [facility], we consider it a privelige to serve you.

## 2021-05-15 ENCOUNTER — Other Ambulatory Visit: Payer: Self-pay | Admitting: Nurse Practitioner

## 2021-05-15 DIAGNOSIS — E039 Hypothyroidism, unspecified: Secondary | ICD-10-CM

## 2021-05-15 LAB — CMP14+EGFR
ALT: 14 IU/L (ref 0–32)
AST: 14 IU/L (ref 0–40)
Albumin/Globulin Ratio: 1.4 (ref 1.2–2.2)
Albumin: 4.2 g/dL (ref 3.8–4.8)
Alkaline Phosphatase: 95 IU/L (ref 44–121)
BUN/Creatinine Ratio: 10 (ref 9–23)
BUN: 8 mg/dL (ref 6–20)
Bilirubin Total: 0.2 mg/dL (ref 0.0–1.2)
CO2: 22 mmol/L (ref 20–29)
Calcium: 9.3 mg/dL (ref 8.7–10.2)
Chloride: 102 mmol/L (ref 96–106)
Creatinine, Ser: 0.83 mg/dL (ref 0.57–1.00)
Globulin, Total: 3 g/dL (ref 1.5–4.5)
Glucose: 78 mg/dL (ref 70–99)
Potassium: 4.8 mmol/L (ref 3.5–5.2)
Sodium: 136 mmol/L (ref 134–144)
Total Protein: 7.2 g/dL (ref 6.0–8.5)
eGFR: 97 mL/min/{1.73_m2} (ref >59)

## 2021-05-15 LAB — TSH: TSH: 19.9 u[IU]/mL — ABNORMAL HIGH (ref 0.450–4.500)

## 2021-06-07 ENCOUNTER — Ambulatory Visit: Payer: Medicaid Other | Admitting: Neurology

## 2021-06-12 ENCOUNTER — Encounter: Payer: Self-pay | Admitting: Neurology

## 2021-06-12 ENCOUNTER — Ambulatory Visit: Payer: Medicaid Other | Admitting: Neurology

## 2021-06-12 VITALS — BP 132/82 | HR 93 | Ht 67.0 in | Wt 295.0 lb

## 2021-06-12 DIAGNOSIS — G4700 Insomnia, unspecified: Secondary | ICD-10-CM

## 2021-06-12 DIAGNOSIS — G43709 Chronic migraine without aura, not intractable, without status migrainosus: Secondary | ICD-10-CM | POA: Diagnosis not present

## 2021-06-12 DIAGNOSIS — R9082 White matter disease, unspecified: Secondary | ICD-10-CM | POA: Diagnosis not present

## 2021-06-12 MED ORDER — EMGALITY 120 MG/ML ~~LOC~~ SOAJ: 1.0000 "pen " | SUBCUTANEOUS | 4 refills | Status: DC

## 2021-06-12 NOTE — Progress Notes (Signed)
[facility]  PATIENT: Kristin Baker DOB: 08/11/1990  REFERRING DOCTOR OR PCP: Jiles Prows, NP SOURCE: Patient, notes from primary care, imaging reports, MRI images personally reviewed.  _________________________________   HISTORICAL  CHIEF COMPLAINT:  Chief Complaint  Patient presents with   Follow-up    Rm 1, alone. Here for 5 month migraine f/u. Last month pt reports having a migraine for over a week, where it was severe that washing her hair was painful and felt like passing out. Topiramate made her feel nausea and caused her to feel bad.     HISTORY OF PRESENT ILLNESS:  Kristin Baker is a 31 y.o. woman with chronic headaches and abnormal brain MRI.  Update 06/12/2021: She has daily headaches, about half with migrainous features since 06/12/22   She did not get a benefit from topiramate and she had tolerability issues.    Caffeine limitations and other lifetyle modifications have not helped.  She notes that Maxalt-MLT will help abort the migrainous headaches when they occur.    She notes the pain in the right frontal region as a throbbing.  Others are in the right occiput as a searing hot poker.   When HA intensifies (1-2 times a week) she gets increased pain and also has nausea and much worse photophobia.    When a HA occurs, she has trouble focusing.  Medications tried: Topiramate was not tolerated well and did not help.   She has tried Excedrin, ibuprofen and Aleve without benefit.  SOme of these also upser her stomach.  Cyclobenzaprine was not tolerated.  Other She often has trouble falling asleep and feels there are some nights that she only sleeps 2 hours.  She has not been told that she snores.  She has an aunt with MS. Donabelle has not had episodes of numbness, visual changes, ataxia or other symptoms that would be typical for demyelination.    Imaging: MRI of the cervical spine 01/04/2021 showed minimal disc bulges at C3-C4, C5-C6 and C6-C7  that did not lead to nerve root compression or spinal stenosis.  There is a small syrinx adjacent to C6 unlikely to be significant.  MRI of the brain 05/24/2020 was fairly normal with just a single T2/flair hyperintense focus in the right parietal periventricular white matter.  This is nonspecific  REVIEW OF SYSTEMS: Constitutional: No fevers, chills, sweats, or change in appetite Eyes: No visual changes, double vision, eye pain Ear, nose and throat: No hearing loss, ear pain, nasal congestion, sore throat Cardiovascular: No chest pain, palpitations Respiratory:  No shortness of breath at rest or with exertion.   No wheezes GastrointestinaI: No nausea, vomiting, diarrhea, abdominal pain, fecal incontinence Genitourinary:  No dysuria, urinary retention or frequency.  No nocturia. Musculoskeletal:  No neck pain, back pain Integumentary: No rash, pruritus, skin lesions Neurological: as above Psychiatric: No depression at this time.  No anxiety Endocrine: No palpitations, diaphoresis, change in appetite, change in weigh or increased thirst Hematologic/Lymphatic:  No anemia, purpura, petechiae. Allergic/Immunologic: No itchy/runny eyes, nasal congestion, recent allergic reactions, rashes  ALLERGIES: Allergies  Allergen Reactions   Flexeril [Cyclobenzaprine] Hives   Lactose Intolerance (Gi) Diarrhea   Honeysuckle Flower [Lonicera] Rash   Mixed Grasses Rash    HOME MEDICATIONS:  Current Outpatient Medications:    Galcanezumab-gnlm (EMGALITY) 120 MG/ML SOAJ, Inject 1 pen into the skin every 28 (twenty-eight) days., Disp: 3 mL, Rfl: 4   hydrochlorothiazide (HYDRODIURIL) 12.5 MG tablet, Take 1 tablet (12.5 mg total) by mouth daily.,  Disp: 90 tablet, Rfl: 0   Multiple Vitamins-Minerals (MULTI-VITAMIN GUMMIES PO), Take 1 Dose by mouth daily., Disp: , Rfl:    NP THYROID 90 MG tablet, Take 1 tablet (90 mg total) by mouth daily., Disp: 90 tablet, Rfl: 0   rizatriptan (MAXALT-MLT) 10 MG  disintegrating tablet, Take 1 tablet (10 mg total) by mouth as needed for migraine. May repeat in 2 hours if needed.  No more than 2/day ot 10/month, Disp: 10 tablet, Rfl: 11   sertraline (ZOLOFT) 50 MG tablet, Take 1 tablet (50 mg total) by mouth daily., Disp: 30 tablet, Rfl: 3  PAST MEDICAL HISTORY: Past Medical History:  Diagnosis Date   Abdominal pain    ADHD    ANA positive    Back pain    DDD (degenerative disc disease), lumbar    Eye pain, right    Fatigue    Frontal sinusitis    Hypertension    Hypothyroidism    Obesity, morbid (HCC)    Palpitations    Rheumatoid arthritis (HCC)     PAST SURGICAL HISTORY: Past Surgical History:  Procedure Laterality Date   ABLATION     05/22/2020 left side, 06/14/2020 right side     FAMILY HISTORY: Family History  Problem Relation Age of Onset   Hyperlipidemia Mother    Peripheral Artery Disease Mother    ADD / ADHD Mother    Bipolar disorder Mother    Cervical cancer Mother        at age 20.   Diabetes Father    Heart attack Father    ADD / ADHD Sister    Healthy Daughter    ADD / ADHD Maternal Aunt    Multiple sclerosis Paternal Aunt    Cervical cancer Maternal Grandmother    Breast cancer Maternal Grandmother    Skin cancer Maternal Grandmother    Heart attack Maternal Grandfather    Colon cancer Neg Hx     SOCIAL HISTORY:  Social History   Socioeconomic History   Marital status: Married    Spouse name: Not on file   Number of children: 0   Years of education: Not on file   Highest education level: Associate degree: academic program  Occupational History   Not on file  Tobacco Use   Smoking status: Former    Packs/day: 1.00    Years: 15.00    Pack years: 15.00    Types: Cigarettes    Quit date: 11/24/2020    Years since quitting: 0.5   Smokeless tobacco: Never   Tobacco comments:    She has quit of and on.  Vaping Use   Vaping Use: Former  Substance and Sexual Activity   Alcohol use: Yes    Comment:  occasionally /social events   Drug use: Not Currently    Comment: smoked weed during teen years   Sexual activity: Yes  Other Topics Concern   Not on file  Social History Narrative   Married for May 2021.Lives with husband and daughter.Homemaker.Husband works at [employer].   Social Determinants of Health   Financial Resource Strain: Low Risk    Difficulty of Paying Living Expenses: Not very hard  Food Insecurity: No Food Insecurity   Worried About Programme researcher, broadcasting/film/video in the Last Year: Never true   Ran Out of Food in the Last Year: Never true  Transportation Needs: No Transportation Needs   Lack of Transportation (Medical): No   Lack of Transportation (Non-Medical): No  Physical Activity: Insufficiently Active   Days of Exercise per Week: 3 days   Minutes of Exercise per Session: 30 min  Stress: Stress Concern Present   Feeling of Stress : To some extent  Social Connections: Moderately Isolated   Frequency of Communication with Friends and Family: More than three times a week   Frequency of Social Gatherings with Friends and Family: Twice a week   Attends Religious Services: Never   Database administrator or Organizations: No   Attends Engineer, structural: Never   Marital Status: Married  Catering manager Violence: Not At Risk   Fear of Current or Ex-Partner: No   Emotionally Abused: No   Physically Abused: No   Sexually Abused: No     PHYSICAL EXAM  Vitals:   06/12/21 1253  BP: 132/82  Pulse: 93  SpO2: 97%  Weight: 295 lb (133.8 kg)  Height:  (1.702 m)    Body mass index is 46.2 kg/m.   General: The patient is well-developed and well-nourished and in no acute distress.  Pharynx is Mallampati 1  HEENT:  Head is [place]/AT.  Sclera are anicteric.     Neck:   The neck is mildly tender at the right occiput  Skin: Extremities are without rash or  edema.  Neurologic Exam  Mental status: The patient is alert and oriented x 3 at the time  of the examination. The patient has apparent normal recent and remote memory, with an apparently normal attention span and concentration ability.   Speech is normal.  Cranial nerves: Extraocular movements are full. Pupils are equal, round, and reactive to light and accomodation.  Facial strength and sensation was normal.  Motor:  Muscle bulk is normal.   Tone is normal. Strength is  5 / 5 in all 4 extremities.   Sensory: Sensory testing is symmetric to touch and vibration in the arms and knees Coordination: Cerebellar testing reveals good finger-nose-finger and heel-to-shin bilaterally.  Gait and station: Station is normal.   Gait is normal. Tandem gait is normal. Romberg is negative.   Reflexes: Deep tendon reflexes are symmetric and normal bilaterally.  Marland Kitchen    DIAGNOSTIC DATA (LABS, IMAGING, TESTING) - I reviewed patient records, labs, notes, testing and imaging myself where available.  Lab Results  Component Value Date   WBC 9.7 07/06/2020   HGB 14.4 07/06/2020   HCT 42.9 07/06/2020   MCV 87.2 07/06/2020   PLT 428 (H) 07/06/2020      Component Value Date/Time   NA 136 05/14/2021 1028   K 4.8 05/14/2021 1028   CL 102 05/14/2021 1028   CO2 22 05/14/2021 1028   GLUCOSE 78 05/14/2021 1028   GLUCOSE 78 10/04/2020 0000   BUN 8 05/14/2021 1028   CREATININE 0.83 05/14/2021 1028   CREATININE 0.76 10/04/2020 0000   CALCIUM 9.3 05/14/2021 1028   PROT 7.2 05/14/2021 1028   ALBUMIN 4.2 05/14/2021 1028   AST 14 05/14/2021 1028   ALT 14 05/14/2021 1028   ALKPHOS 95 05/14/2021 1028   BILITOT 0.2 05/14/2021 1028   GFRNONAA 105 10/04/2020 0000   GFRAA 122 10/04/2020 0000   Lab Results  Component Value Date   CHOL 223 (H) 01/11/2020   HDL 32 (L) 01/11/2020   LDLCALC 166 (H) 01/11/2020   TRIG 119 01/11/2020   CHOLHDL 7.0 (H) 01/11/2020   Lab Results  Component Value Date   HGBA1C 5.1 01/11/2020   No results found for: [id number] Lab Results  Component Value Date   TSH  19.900 (H) 05/14/2021       ASSESSMENT AND PLAN  Chronic migraine w/o aura, not intractable, w/o stat migr  White matter abnormality on MRI of brain  Insomnia, unspecified type   D/c topiramate.   Emgality 120 mg monthly for migraines.   A sample with the loading dose was dispensed. Maxalt for breakthrough RTc 1 year, sooner if new or worsening symptoms.     Tegan Burnside A. Epimenio FootSater, MD, [facility],FAAN 06/12/2021, 1:38 PM Certified in Neurology, Clinical Neurophysiology, Sleep Medicine and Neuroimaging  [facility] [address], [address] [place], [place] [address] [phone]

## 2021-06-18 ENCOUNTER — Telehealth: Payer: Self-pay | Admitting: Neurology

## 2021-06-18 NOTE — Telephone Encounter (Signed)
Called pt back. Advised we submitted PA, pending determination w/ insurance. As soon as we hear something, we will update her.

## 2021-06-18 NOTE — Telephone Encounter (Signed)
We did not receive PA request from pharmacy to complete. I submitted PA on CMM. Key: [id number]. Waiting on determination from [employer].

## 2021-06-18 NOTE — Telephone Encounter (Signed)
Pt would like a call from the nurse to discuss if have sent PA  for Galcanezumab-gnlm [facility]) 120 MG/ML SOAJ  to insurance company. Pharmacy told prescription has not be processed yet. Pharmacy instructed to call [facility] to make sure have been sent to insurance

## 2021-06-18 NOTE — Telephone Encounter (Addendum)
PA approved until 09/16/21. Request Reference Number: [id number]. For further questions, call [employer] at [phone].  I called pt to let her know rx approved. She should now be able to pick up from the pharmacy, she verbalized understanding.

## 2021-06-20 ENCOUNTER — Other Ambulatory Visit: Payer: Self-pay | Admitting: Nurse Practitioner

## 2021-06-20 DIAGNOSIS — E559 Vitamin D deficiency, unspecified: Secondary | ICD-10-CM

## 2021-06-20 LAB — LIPID PANEL
Chol/HDL Ratio: 5.2 ratio — ABNORMAL HIGH (ref 0.0–4.4)
Cholesterol, Total: 177 mg/dL (ref 100–199)
HDL: 34 mg/dL — ABNORMAL LOW (ref >39)
LDL Chol Calc (NIH): 122 mg/dL — ABNORMAL HIGH (ref 0–99)
Triglycerides: 112 mg/dL (ref 0–149)
VLDL Cholesterol Cal: 21 mg/dL (ref 5–40)

## 2021-06-20 LAB — HEMOGLOBIN A1C
Est. average glucose Bld gHb Est-mCnc: 111 mg/dL
Hgb A1c MFr Bld: 5.5 % (ref 4.8–5.6)

## 2021-06-20 LAB — CBC
Hematocrit: 39.7 % (ref 34.0–46.6)
Hemoglobin: 13.5 g/dL (ref 11.1–15.9)
MCH: 28.8 pg (ref 26.6–33.0)
MCHC: 34 g/dL (ref 31.5–35.7)
MCV: 85 fL (ref 79–97)
Platelets: 452 10*3/uL — ABNORMAL HIGH (ref 150–450)
RBC: 4.68 x10E6/uL (ref 3.77–5.28)
RDW: 14.1 % (ref 11.7–15.4)
WBC: 9.6 10*3/uL (ref 3.4–10.8)

## 2021-06-20 LAB — TSH: TSH: 1.04 u[IU]/mL (ref 0.450–4.500)

## 2021-06-20 LAB — VITAMIN D 25 HYDROXY (VIT D DEFICIENCY, FRACTURES): Vit D, 25-Hydroxy: 12.1 ng/mL — ABNORMAL LOW (ref 30.0–100.0)

## 2021-06-20 MED ORDER — VITAMIN D (ERGOCALCIFEROL) 1.25 MG (50000 UNIT) PO CAPS: 50000.0000 [IU] | ORAL_CAPSULE | ORAL | 0 refills | Status: AC

## 2021-06-20 MED ORDER — VITAMIN D3 25 MCG (1000 UT) PO CAPS: 1000.0000 [IU] | ORAL_CAPSULE | Freq: Every day | ORAL | 3 refills | Status: DC

## 2021-06-25 ENCOUNTER — Encounter: Payer: Self-pay | Admitting: Nurse Practitioner

## 2021-06-25 ENCOUNTER — Ambulatory Visit (INDEPENDENT_AMBULATORY_CARE_PROVIDER_SITE_OTHER): Payer: Medicaid Other | Admitting: Nurse Practitioner

## 2021-06-25 ENCOUNTER — Other Ambulatory Visit: Payer: Self-pay

## 2021-06-25 VITALS — BP 142/85 | HR 70 | Temp 98.6°F | Resp 18 | Ht 67.0 in | Wt 300.0 lb

## 2021-06-25 DIAGNOSIS — F32A Depression, unspecified: Secondary | ICD-10-CM

## 2021-06-25 DIAGNOSIS — E782 Mixed hyperlipidemia: Secondary | ICD-10-CM | POA: Diagnosis not present

## 2021-06-25 DIAGNOSIS — E039 Hypothyroidism, unspecified: Secondary | ICD-10-CM

## 2021-06-25 DIAGNOSIS — E559 Vitamin D deficiency, unspecified: Secondary | ICD-10-CM

## 2021-06-25 DIAGNOSIS — E785 Hyperlipidemia, unspecified: Secondary | ICD-10-CM | POA: Insufficient documentation

## 2021-06-25 DIAGNOSIS — I10 Essential (primary) hypertension: Secondary | ICD-10-CM

## 2021-06-25 DIAGNOSIS — F419 Anxiety disorder, unspecified: Secondary | ICD-10-CM

## 2021-06-25 NOTE — Assessment & Plan Note (Signed)
Start vitamin d 50,000 units once weekly for 8 weeks,  Take 1000 units once daily after wards.

## 2021-06-25 NOTE — Assessment & Plan Note (Addendum)
PHQ score was 24 its now 8 , pt states that she feels much better.  She states that she is having better intimacy with her husband which was not prior to starting this medication.  Patient still wants a referral to psych. GAD 7 score today 17, previously 21 Denies SI, HI.  Continue Zoloft 50 mg daily,

## 2021-06-25 NOTE — Assessment & Plan Note (Signed)
DASH diet and commitment to daily physical activity for a minimum of 30 minutes discussed and encouraged, as a part of hypertension management. The importance of attaining a healthy weight is also discussed.  BP/Weight 06/25/2021 06/12/2021 05/14/2021 01/15/2021 01/03/2021 10/04/2020 09/18/2020  Systolic BP 142 132 145 143 124 132 128  Diastolic BP 85 82 84 91 82 78 88  Wt. (Lbs) - 295 299 287 294.6 287.8 287.2  BMI - 46.2 46.83 44.95 46.14 45.08 44.98   Continue current medication

## 2021-06-25 NOTE — Assessment & Plan Note (Addendum)
Importance of healthy food choices with portion control discussed as well as eating regularly within 12  hour window.   The need to choose clean green food 50%-75% of time is discussed as well as make water the primary drink and set a goal for 64 ounces daily.  Patient reeducated about the importance of committment to minimum of 150 minutes of exercise per week.  Three meals at set times with snacks allowed between meals but they must be fruit or vegetable.   Aim to eat  over 12 hour period  for example 7 am to 7 pm. Stop after your last meal of the day.  Wt Readings from Last 3 Encounters:  06/25/21 300 lb (136.1 kg)  06/12/21 295 lb (133.8 kg)  05/14/21 299 lb (135.6 kg)

## 2021-06-25 NOTE — Assessment & Plan Note (Signed)
Lab Results  Component Value Date   CHOL 177 06/19/2021   HDL 34 (L) 06/19/2021   LDLCALC 122 (H) 06/19/2021   TRIG 112 06/19/2021   CHOLHDL 5.2 (H) 06/19/2021  planning on getting pregnant.   Eat a healthy diet, including lots of fruits and vegetables. Avoid foods with a lot of saturated and trans fats, such as red meat, butter, fried foods and cheese . Maintain a healthy weight.

## 2021-06-25 NOTE — Patient Instructions (Signed)
Please get your labs done 3 to 5 days before your next appointment.    It is important that you exercise regularly at least 30 minutes 5 times a week.  Think about what you will eat, plan ahead. Choose " clean, green, fresh or frozen" over canned, processed or packaged foods which are more sugary, salty and fatty. 70 to 75% of food eaten should be vegetables and fruit. Three meals at set times with snacks allowed between meals, but they must be fruit or vegetables. Aim to eat over a 12 hour period , example 7 am to 7 pm, and STOP after  your last meal of the day. Drink water,generally about 64 ounces per day, no other drink is as healthy. Fruit juice is best enjoyed in a healthy way, by EATING the fruit.  Thanks for choosing [facility], we consider it a privelige to serve you.

## 2021-06-25 NOTE — Assessment & Plan Note (Signed)
Lab Results  Component Value Date   TSH 1.040 06/19/2021  Continue NP thyroid 90 mcg daily. Patient would like to know what her  T3, T4 levels are,  labs ordered today

## 2021-06-25 NOTE — Progress Notes (Signed)
° °  Vergie Maguire     MRN: [id number]      DOB: 10/09/1990   HPI Ms. Kafer is here for follow up for depression and hypothyroidism,  and re-evaluation of chronic medical conditions, medication management and review of any available recent lab and radiology data.  Preventive health is updated, specifically  Cancer screening and Immunization.   Questions or concerns regarding consultations or procedures which the PT has had in the interim are  addressed. The PT denies any adverse reactions to current medications since the last visit.  There are no new concerns.  There are no specific complaints   Patient states that she is doing so much better on Zoloft 50 mg, stated that she has been having better intimacy with her husband which was not prior to starting med in Dec 2022. she lost her grandfather 2 years ago and things has not been the same since then, she would still like to speak with a psychiatrist.   She has gone for therapy in the past but she does not want to anymore, denies SI, HI.    ROS Denies recent fever or chills. Denies sinus pressure, nasal congestion, ear pain or sore throat. Denies chest congestion, productive cough or wheezing. Denies chest pains, palpitations and leg swelling Denies abdominal pain, nausea, vomiting,diarrhea or constipation.   Has  depression, anxiety. .   PE  BP (!) 142/85    Pulse 70    Temp 98.6 F (37 C)    Resp 18    SpO2 97%   Patient alert and oriented and in no cardiopulmonary distress.  Chest: Clear to auscultation bilaterally.  CVS: S1, S2 no murmurs, no S3.Regular rate.  ABD: Soft non tender.   Ext: No edema  MS: Adequate ROM spine, shoulders, hips and knees.  Skin: Intact, no ulcerations or rash noted.  Psych: Good eye contact, normal affect. Memory intact not anxious or depressed appearing.     Assessment & Plan

## 2021-06-26 LAB — TSH+T4F+T3FREE
Free T4: 0.72 ng/dL — ABNORMAL LOW (ref 0.82–1.77)
T3, Free: 3.6 pg/mL (ref 2.0–4.4)
TSH: 1.2 u[IU]/mL (ref 0.450–4.500)

## 2021-06-26 NOTE — Progress Notes (Signed)
Pls review results with pt. Her Free T4 is low, she should continue her NP thyroid, thanks

## 2021-07-03 ENCOUNTER — Other Ambulatory Visit: Payer: Self-pay | Admitting: *Deleted

## 2021-07-03 NOTE — Patient Outreach (Signed)
Medicaid Managed Care Social Work Note  07/03/2021 Name:  Kristin Baker MRN:  [id number] DOB:  December 01, 1990  Kristin Baker is an 31 y.o. year old female who is a primary patient of Kristin Beers, FNP.  The Medicaid Managed Care Coordination team was consulted for assistance with:  Mental Health Counseling and Resources Grief Counseling  Kristin Baker was given information about Medicaid Managed Care Coordination team services today. Kristin Baker Patient agreed to services and verbal consent obtained.  Engaged with patient  for by telephone forinitial visit in response to referral for case management and/or care coordination services.   Assessments/Interventions:  Review of past medical history, allergies, medications, health status, including review of consultants reports, laboratory and other test data, was performed as part of comprehensive evaluation and provision of chronic care management services.  SDOH: (Social Determinant of Health) assessments and interventions performed: SDOH Interventions    Flowsheet Row Most Recent Value  SDOH Interventions   Intimate Partner Violence Interventions Intervention Not Indicated  Stress Interventions Intervention Not Indicated  Transportation Interventions Intervention Not Indicated       Advanced Directives Status:  See Care Plan for related entries.  Care Plan                 Allergies  Allergen Reactions   Flexeril [Cyclobenzaprine] Hives   Lactose Intolerance (Gi) Diarrhea   Honeysuckle Flower [Lonicera] Rash   Mixed Grasses Rash    Medications Reviewed Today     Reviewed by Kristin Mam, LCSW (Social Worker) on 07/03/21 at 1056  Med List Status: <None>   Medication Order Taking? Sig Documenting Provider Last Dose Status Informant  Cholecalciferol (VITAMIN D3) 25 MCG (1000 UT) CAPS [id number] No Take 1 capsule (1,000 Units total) by mouth daily.  Patient not taking: Reported on 06/25/2021   Kristin Beers,  FNP Not Taking Active   Galcanezumab-gnlm [facility]) 120 MG/ML SOAJ [id number] No Inject 1 pen into the skin every 28 (twenty-eight) days. Sater, [name], MD Taking Active   hydrochlorothiazide (HYDRODIURIL) 12.5 MG tablet [id number] No Take 1 tablet (12.5 mg total) by mouth daily. Kristin Beers, FNP Taking Active   Multiple Vitamins-Minerals (MULTI-VITAMIN GUMMIES PO) [id number] No Take 1 Dose by mouth daily.  Patient not taking: Reported on 06/25/2021   [provider] Not Taking Active Self  NP THYROID 90 MG tablet [id number] No Take 1 tablet (90 mg total) by mouth daily. Kristin Beers, FNP Taking Active   rizatriptan (MAXALT-MLT) 10 MG disintegrating tablet [id number] No Take 1 tablet (10 mg total) by mouth as needed for migraine. May repeat in 2 hours if needed.  No more than 2/day ot 10/month Sater, [name], MD Taking Active   sertraline (ZOLOFT) 50 MG tablet [id number] No Take 1 tablet (50 mg total) by mouth daily. Kristin Beers, FNP Taking Active   Vitamin D, Ergocalciferol, (DRISDOL) 1.25 MG (50000 UNIT) CAPS capsule [id number] No Take 1 capsule (50,000 Units total) by mouth every 7 (seven) days. Kristin Beers, FNP Taking Active             Patient Active Problem List   Diagnosis Date Noted   Hyperlipidemia 06/25/2021   H/O multiple allergies 05/14/2021   Anxiety and depression 05/14/2021   Morbid obesity (HCC) 05/14/2021   Hypertension 05/14/2021   Allergies 05/14/2021   Chronic bilateral low back pain without sciatica 05/14/2021   Vitamin D deficiency 01/15/2021   Chronic migraine w/o aura, not intractable, w/o stat migr  01/15/2021   Chronic daily headache 01/15/2021   White matter abnormality on MRI of brain 01/15/2021   Acquired hypothyroidism 07/10/2020   Essential hypertension 07/10/2020   DDD (degenerative disc disease), lumbar 01/11/2020    Conditions to be addressed/monitored per PCP order:  Anxiety and Depression  Care Plan : LCSW  Plan of Care  Updates made by Kristin Mamaldwell, Kristin Patty P, LCSW since 07/03/2021 12:00 AM     Problem: Symptoms (Depression, Anxiety, ADHD)   Priority: High     Long-Range Goal: Symptoms Monitored and Managed   Start Date: 07/03/2021  Expected End Date: 10/23/2021  This Visit's Progress: On track  Priority: High  Note:   Current Barriers:  Mental Health Concerns  and Lacks knowledge of community resource:     CSW Clinical Goal(s):  Patient  verbalize understanding of plan for management of Anxiety, Depression, and ADHD  through collaboration with Clinical Social Worker, provider, and care team.   Interventions:  CSW spoke with pt by phone who acknowledges long history of anxiety and depression- was made worse after her grandfather's death in December, 2022.  Pt was recently started on Zoloft in December 2022 and feels "this is helping a lot".  She also shares significant anxiety; long-standing, as well as a thoughts she may be ADHD and "on the spectrum".  CSW validated pt's concerns and wishes to seek help. Pt denies any current SI/HI (history in her childhood 31(11-12yo age and early teens).  She is interested in seeking mental health counseling support as well as to be evaluated by Psychiatry and ADHD specialist. CSW will assist pt with resources/linking her with these clinicians for further care.  Pt has positive support from her spouse and her mother who is local- her mother often times will pick daughter up from school so she does not have to; "I don't leave the house unless I have to" (due to anxiety).   1:1 collaboration with primary care provider regarding development and update of comprehensive plan of care as evidenced by provider attestation and co-signature Inter-disciplinary care team collaboration (see longitudinal plan of care) Evaluation of current treatment plan related to  self management and patient's adherence to plan as established by provider Review resources, discussed options and  provided patient information about  Advance Directive Education (discussed and encouraged pt to consider completion)  Patient Self-Care Activities: Continue with compliance of taking medication  Continue with your self-care action plan   I have placed an inquiry for counseling and Psychiatry referral - will follow up for schedule/scheduling.            Follow up:  Patient agrees to Care Plan and Follow-up.  Plan: The Managed Medicaid care management team will reach out to the patient again over the next 10 days.  Date/time of next scheduled Social Work care management/care coordination outreach:  07/12/21 if not before   Reece LevyJanet Ahsan Esterline MSW, LCSW Licensed Visual merchandiserClinical Social Worker Managed Medicaid Coverage   [phone]

## 2021-07-03 NOTE — Patient Instructions (Signed)
Visit Information  Ms. Arpin was given information about Medicaid Managed Care team care coordination services as a part of their [facility] benefit. Jonita AlbeeShannon Dain verbally consented to engagement with the [facility].   If you are experiencing a medical emergency, please call 911 or report to your local emergency department or urgent care.   If you have a non-emergency medical problem during routine business hours, please contact your provider's office and ask to speak with a nurse.   For questions related to your [facility] Medicaid, please call: [phone] or visit the homepage here: [email or web address]  If you would like to schedule transportation through your [facility] Medicaid, please call the following number at least 2 days in advance of your appointment: [phone].   Call the Behavioral Health Crisis Line at [phone], at any time, 24 hours a day, 7 days a week. If you are in danger or need immediate medical attention call 911.  If you would like help to quit smoking, call 1-800-QUIT-NOW (973-010-73391-862-098-2031) OR Espaol: 1-855-Djelo-Ya (2-725-366-4403(1-973-808-0906) o para ms informacin haga clic aqu or Text READY to 474-259200-400 to register via text  Ms. Browe - following are the goals we discussed in your visit today:   Goals Addressed               This Visit's Progress     Begin and Stick with Counseling-Depression/Anxiety and ADHD (pt-stated)        Timeframe:  Long-Range Goal Priority:  High Start Date:  07/03/21                           Expected End Date:             10/23/21          Follow Up Date 07/12/21    -continue with medications as presscribed -review material mailed to you for ADHD evaluation -expect call from me re; Psychiatry and counseling suppor - keep 90 percent of counseling appointments  -call 911 or [phone] as discussed  for emergencies/support   Why is this important?   Beating depression may take some time.  If you don't feel better right away, don't give up on your treatment plan.    Notes:         Please see education materials related to ADHD, counseling support, etc  provided by e-mail link. and phone  The patient verbalized understanding of instructions provided today and declined a print copy of patient instruction materials.   Licensed Clinical Social Worker will follow up with pt by phone once Providers are found.   Reece LevyJanet Charniece Venturino MSW, LCSW Licensed Clinical Social Worker Managed Medicaid Coverage   [phone]   Following is a copy of your plan of care:  Care Plan : LCSW Plan of Care  Updates made by Buck Mamaldwell, Eldwin Volkov P, LCSW since 07/03/2021 12:00 AM     Problem: Symptoms (Depression, Anxiety, ADHD)   Priority: High     Long-Range Goal: Symptoms Monitored and Managed   Start Date: 07/03/2021  Expected End Date: 10/23/2021  This Visit's Progress: On track  Priority: High  Note:   Current Barriers:  Mental Health Concerns  and Lacks knowledge of community resource:     CSW Clinical Goal(s):  Patient  verbalize understanding of plan for management of Anxiety, Depression, and ADHD  through collaboration with Clinical Social Worker, provider, and care team.   Interventions:  CSW spoke with pt by phone who acknowledges long history of anxiety and depression- was made worse after her grandfather's death in 23-May-2021.  Pt was recently started on Zoloft in 12/28/2022and feels "this is helping a lot".  She also shares significant anxiety; long-standing, as well as a thoughts she may be ADHD and "on the spectrum".  CSW validated pt's concerns and wishes to seek help. Pt denies any current SI/HI (history in her childhood (41-12yo age and early teens).  She is interested in seeking mental health counseling support as well as to be evaluated by Psychiatry and ADHD specialist. CSW will  assist pt with resources/linking her with these clinicians for further care.  Pt has positive support from her spouse and her mother who is local- her mother often times will pick daughter up from school so she does not have to; "I don't leave the house unless I have to" (due to anxiety).   1:1 collaboration with primary care provider regarding development and update of comprehensive plan of care as evidenced by provider attestation and co-signature Inter-disciplinary care team collaboration (see longitudinal plan of care) Evaluation of current treatment plan related to  self management and patient's adherence to plan as established by provider Review resources, discussed options and provided patient information about  Advance Directive Education (discussed and encouraged pt to consider completion)  Patient Self-Care Activities: Continue with compliance of taking medication  Continue with your self-care action plan   I have placed an inquiry for counseling and Psychiatry referral - will follow up for schedule/scheduling.

## 2021-07-06 ENCOUNTER — Ambulatory Visit: Payer: Medicaid Other

## 2021-07-12 ENCOUNTER — Ambulatory Visit: Payer: Self-pay

## 2021-07-13 ENCOUNTER — Telehealth: Payer: Self-pay | Admitting: *Deleted

## 2021-07-13 NOTE — Patient Outreach (Signed)
Care Coordination  07/13/2021  Kristin Baker 1990-05-28 [id number]  CSW attempted to follow up with patient today and was unsuccessful. No voicemail offered.  Care team  will try again to reschedule.  Eduard Clos MSW, LCSW Licensed Holiday representative Managed Medicaid Coverage   [phone]

## 2021-07-18 ENCOUNTER — Other Ambulatory Visit: Payer: Self-pay

## 2021-07-18 ENCOUNTER — Ambulatory Visit (INDEPENDENT_AMBULATORY_CARE_PROVIDER_SITE_OTHER): Payer: Medicaid Other | Admitting: Allergy & Immunology

## 2021-07-18 ENCOUNTER — Encounter: Payer: Self-pay | Admitting: Allergy & Immunology

## 2021-07-18 VITALS — BP 128/74 | HR 68 | Temp 97.3°F | Resp 16 | Ht 68.0 in | Wt 306.2 lb

## 2021-07-18 DIAGNOSIS — J3089 Other allergic rhinitis: Secondary | ICD-10-CM | POA: Diagnosis not present

## 2021-07-18 DIAGNOSIS — L299 Pruritus, unspecified: Secondary | ICD-10-CM

## 2021-07-18 DIAGNOSIS — J302 Other seasonal allergic rhinitis: Secondary | ICD-10-CM

## 2021-07-18 DIAGNOSIS — J4530 Mild persistent asthma, uncomplicated: Secondary | ICD-10-CM

## 2021-07-18 MED ORDER — FLUTICASONE PROPIONATE HFA 110 MCG/ACT IN AERO: 2.0000 | INHALATION_SPRAY | Freq: Two times a day (BID) | RESPIRATORY_TRACT | 5 refills | Status: DC

## 2021-07-18 MED ORDER — LEVOCETIRIZINE DIHYDROCHLORIDE 5 MG PO TABS: 5.0000 mg | ORAL_TABLET | Freq: Two times a day (BID) | ORAL | 5 refills | Status: DC

## 2021-07-18 MED ORDER — ALBUTEROL SULFATE HFA 108 (90 BASE) MCG/ACT IN AERS: 2.0000 | INHALATION_SPRAY | RESPIRATORY_TRACT | 1 refills | Status: DC | PRN

## 2021-07-18 MED ORDER — AZELASTINE HCL 0.1 % NA SOLN: NASAL | 5 refills | Status: DC

## 2021-07-18 MED ORDER — TRIAMCINOLONE ACETONIDE 55 MCG/ACT NA AERO: 1.0000 | INHALATION_SPRAY | Freq: Every day | NASAL | 5 refills | Status: DC

## 2021-07-18 NOTE — Progress Notes (Signed)
NEW PATIENT  Date of Service/Encounter:  07/18/21  Consult requested by: Donell BeersPaseda, Folashade R, FNP   Assessment:   Seasonal and perennial allergic rhinitis (ragweed, weeds, indoor molds, outdoor molds, dust mites, cat, and cockroach)  Pruritus  Mild persistent asthma without complication  Plan/Recommendations:   1. Chronic rhinitis - Testing today showed: ragweed, weeds, indoor molds, outdoor molds, dust mites, cat, and cockroach. - Copy of test results provided.  - Avoidance measures provided. - Stop taking: all of your current medications - Start taking: Xyzal (levocetirizine) 5mg  tablet once daily, Nasacort (triamcinolone) one spray per nostril daily (AIM FOR EAR ON EACH SIDE), and Astelin (azelastine) 2 sprays per nostril 1-2 times daily as needed - You can use an extra dose of the antihistamine, if needed, for breakthrough symptoms.  - Consider nasal saline rinses 1-2 times daily to remove allergens from the nasal cavities as well as help with mucous clearance (this is especially helpful to do before the nasal sprays are given) - Consider allergy shots as a means of long-term control. - Allergy shots "re-train" and "reset" the immune system to ignore environmental allergens and decrease the resulting immune response to those allergens (sneezing, itchy watery eyes, runny nose, nasal congestion, etc).    - Allergy shots improve symptoms in 75-85% of patients.  - We can discuss more at the next appointment if the medications are not working for you.  2. Pruritus - Hopefully the twice daily new antihistamine will help with the itching. - We can do labs if needed at the next visit. - Testing to all of the most common food was negative.   3. Chronic cough - Spirometry looked somewhat normal, but it did get better with the Xopenex treatment. - We are going to start an inhaled steroid to see if this helps. - Spacer use reviewed. - Daily controller medication(s): Flovent 110mcg  2 puffs twice daily with spacer - Prior to physical activity: albuterol 2 puffs 10-15 minutes before physical activity. - Rescue medications: albuterol 4 puffs every 4-6 hours as needed - Asthma control goals:  * Full participation in all desired activities (may need albuterol before activity) * Albuterol use two time or less a week on average (not counting use with activity) * Cough interfering with sleep two time or less a month * Oral steroids no more than once a year * No hospitalizations  4. Return in about 6 weeks (around 08/29/2021).     This note in its entirety was forwarded to the Provider who requested this consultation.  Subjective:   Kristin Baker is a 31 y.o. female presenting today for evaluation of  Chief Complaint  Patient presents with   Allergy Testing    Environmental and Food   Eczema    Possible    Kristin AlbeeShannon Rabalais has a history of the following: Patient Active Problem List   Diagnosis Date Noted   Seasonal and perennial allergic rhinitis 07/18/2021   Hyperlipidemia 06/25/2021   H/O multiple allergies 05/14/2021   Anxiety and depression 05/14/2021   Morbid obesity (HCC) 05/14/2021   Hypertension 05/14/2021   Allergies 05/14/2021   Chronic bilateral low back pain without sciatica 05/14/2021   Vitamin D deficiency 01/15/2021   Chronic migraine w/o aura, not intractable, w/o stat migr 01/15/2021   Chronic daily headache 01/15/2021   White matter abnormality on MRI of brain 01/15/2021   Acquired hypothyroidism 07/10/2020   Essential hypertension 07/10/2020   DDD (degenerative disc disease), lumbar 01/11/2020    History  obtained from: chart review and patient.  Kristin Baker was referred by [name], FNP.     Kristin Baker is a 31 y.o. female presenting for an evaluation of a mutlitide of complaints .  She tells me that she has developed new allergies over the last couple of months. She has runny nose, coughing, and throat clearing. She  has never been tested previously and has never seen an allergist. She reports a wet cough. She has never wheezed. She has this coughing all of the time, for at least four years. She also gets a tickle from certain scents and whatnot. She has never had albuterol previously.    She has gotten super itchy and has been around dogs her whole life and she started having itching from it. She also reports hives with cosmetics that she has been using for a long period of time. Dairy has been a problem. She also reports some eczema issues. She does have lesions that can stay for a couple of days. She has some lesions on her neck as well. She does have a lot of topical medications that do not help a whole lot. She has tried a number of OTC medications including Gold Bond and hydrocortisone without much relief.   She takes Benadryl at night to help with sleeping. She has tried Claritin-D as well as cetirizine. She has tried a number of them without improvement. She has not tried any nose sprays at all, but she is not sure. She just sets it aside when it does not work. She typically tries it for a week or two at least sometimes longer. She has been on Mucinex which does not help.    She has an 11yo daughter.   Otherwise, there is no history of other atopic diseases, including drug allergies, stinging insect allergies, urticaria, or contact dermatitis. There is no significant infectious history. Vaccinations are up to date.    Past Medical History: Patient Active Problem List   Diagnosis Date Noted   Seasonal and perennial allergic rhinitis 07/18/2021   Hyperlipidemia 06/25/2021   H/O multiple allergies 05/14/2021   Anxiety and depression 05/14/2021   Morbid obesity (HCC) 05/14/2021   Hypertension 05/14/2021   Allergies 05/14/2021   Chronic bilateral low back pain without sciatica 05/14/2021   Vitamin D deficiency 01/15/2021   Chronic migraine w/o aura, not intractable, w/o stat migr 01/15/2021   Chronic  daily headache 01/15/2021   White matter abnormality on MRI of brain 01/15/2021   Acquired hypothyroidism 07/10/2020   Essential hypertension 07/10/2020   DDD (degenerative disc disease), lumbar 01/11/2020    Medication List:  Allergies as of 07/18/2021       Reactions   Flexeril [cyclobenzaprine] Hives   Lactose Intolerance (gi) Diarrhea   Honeysuckle Flower [lonicera] Rash   Mixed Grasses Rash        Medication List        Accurate as of July 18, 2021  1:33 PM. If you have any questions, ask your nurse or doctor.          STOP taking these medications    MULTI-VITAMIN GUMMIES PO Stopped by: [name], MD   Vitamin D3 25 MCG (1000 UT) Caps Stopped by: [name], MD       TAKE these medications    albuterol 108 (90 Base) MCG/ACT inhaler Commonly known as: VENTOLIN HFA Inhale 2 puffs into the lungs every 4 (four) hours as needed for wheezing or shortness of breath (cough,  shortness of breath or wheezing.). Started by: [name], MD   azelastine 0.1 % nasal spray Commonly known as: ASTELIN 2 sprays per nostril 1-2 times daily as needed. Started by: [name], MD   Emgality 120 MG/ML Soaj Generic drug: Galcanezumab-gnlm Inject 1 pen into the skin every 28 (twenty-eight) days.   fluticasone 110 MCG/ACT inhaler Commonly known as: Flovent HFA Inhale 2 puffs into the lungs 2 (two) times daily. With spacer. Started by: [name], MD   hydrochlorothiazide 12.5 MG tablet Commonly known as: HYDRODIURIL Take 1 tablet (12.5 mg total) by mouth daily.   levocetirizine 5 MG tablet Commonly known as: XYZAL Take 1 tablet (5 mg total) by mouth in the morning and at bedtime. Started by: [name], MD   NP Thyroid 90 MG tablet Generic drug: thyroid Take 1 tablet (90 mg total) by mouth daily.   rizatriptan 10 MG disintegrating tablet Commonly known as: MAXALT-MLT Take 1 tablet (10 mg total) by  mouth as needed for migraine. May repeat in 2 hours if needed.  No more than 2/day ot 10/month   sertraline 50 MG tablet Commonly known as: ZOLOFT Take 1 tablet (50 mg total) by mouth daily.   triamcinolone 55 MCG/ACT Aero nasal inhaler Commonly known as: NASACORT Place 1 spray into the nose daily. Started by: [name], MD   Vitamin D (Ergocalciferol) 1.25 MG (50000 UNIT) Caps capsule Commonly known as: DRISDOL Take 1 capsule (50,000 Units total) by mouth every 7 (seven) days.        Birth History: non-contributory  Developmental History: non-contributory  Past Surgical History: Past Surgical History:  Procedure Laterality Date   ABLATION     05/22/2020 left side, 06/14/2020 right side      Family History: Family History  Problem Relation Age of Onset   Hyperlipidemia Mother    Peripheral Artery Disease Mother    ADD / ADHD Mother    Bipolar disorder Mother    Cervical cancer Mother        at age 4.   Diabetes Father    Heart attack Father    ADD / ADHD Sister    Healthy Daughter    ADD / ADHD Maternal Aunt    Multiple sclerosis Paternal Aunt    Cervical cancer Maternal Grandmother    Breast cancer Maternal Grandmother    Skin cancer Maternal Grandmother    Heart attack Maternal Grandfather    Colon cancer Neg Hx      Social History: Sabreen lives at home with her husband and her daughter. She is a stay at home mother.  She lives in a house that is over 32 years old.  There is wood with the area rugs throughout the home.  They have electric heating and window units for cooling.  There is one cat inside of the home.  There are no dust mite covers on the bedding.  There is no tobacco exposure.  She was a smoker until 2022.  Review of Systems  Constitutional: Negative.  Negative for chills, fever, malaise/fatigue and weight loss.  HENT:  Positive for congestion. Negative for ear discharge, ear pain and sinus pain.   Eyes:  Negative for pain,  discharge and redness.  Respiratory:  Positive for cough. Negative for sputum production, shortness of breath and wheezing.   Cardiovascular: Negative.  Negative for chest pain and palpitations.  Gastrointestinal:  Negative for abdominal pain, constipation, diarrhea, heartburn, nausea and vomiting.  Skin:  Positive for itching and rash.  Neurological:  Negative for dizziness and headaches.  Endo/Heme/Allergies:  Negative for environmental allergies. Does not bruise/bleed easily.      Objective:   Blood pressure 128/74, pulse 68, temperature (!) 97.3 F (36.3 C), resp. rate 16, height 5\' 8"  (1.727 m), weight (!) 306 lb 3.2 oz (138.9 kg), SpO2 98 %. Body mass index is 46.56 kg/m.     Physical Exam Vitals reviewed.  Constitutional:      Appearance: She is well-developed.  HENT:     Head: Normocephalic and atraumatic.     Right Ear: Tympanic membrane, ear canal and external ear normal. No drainage, swelling or tenderness. Tympanic membrane is not injected, scarred, erythematous, retracted or bulging.     Left Ear: Tympanic membrane, ear canal and external ear normal. No drainage, swelling or tenderness. Tympanic membrane is not injected, scarred, erythematous, retracted or bulging.     Nose: Mucosal edema and rhinorrhea present. No nasal deformity or septal deviation.     Right Turbinates: Enlarged, swollen and pale.     Left Turbinates: Enlarged, swollen and pale.     Right Sinus: No maxillary sinus tenderness or frontal sinus tenderness.     Left Sinus: No maxillary sinus tenderness or frontal sinus tenderness.     Mouth/Throat:     Mouth: Mucous membranes are not pale and not dry.     Pharynx: Uvula midline.  Eyes:     General:        Right eye: No discharge.        Left eye: No discharge.     Conjunctiva/sclera: Conjunctivae normal.     Right eye: Right conjunctiva is not injected. No chemosis.    Left eye: Left conjunctiva is not injected. No chemosis.    Pupils: Pupils  are equal, round, and reactive to light.  Cardiovascular:     Rate and Rhythm: Normal rate and regular rhythm.     Heart sounds: Normal heart sounds.  Pulmonary:     Effort: Pulmonary effort is normal. No tachypnea, accessory muscle usage or respiratory distress.     Breath sounds: Normal breath sounds. No wheezing, rhonchi or rales.  Chest:     Chest wall: No tenderness.  Abdominal:     Tenderness: There is no abdominal tenderness. There is no guarding or rebound.  Lymphadenopathy:     Head:     Right side of head: No submandibular, tonsillar or occipital adenopathy.     Left side of head: No submandibular, tonsillar or occipital adenopathy.     Cervical: No cervical adenopathy.  Skin:    General: Skin is warm.     Capillary Refill: Capillary refill takes less than 2 seconds.     Coloration: Skin is not pale.     Findings: No abrasion, erythema, petechiae or rash. Rash is not papular, urticarial or vesicular.     Comments: Sensitive skin. Mild dermatographism.   Neurological:     Mental Status: She is alert.  Psychiatric:        Behavior: Behavior is cooperative.     Diagnostic studies:    Spirometry: results abnormal (FEV1: 2.50/69%, FVC: 2.98/69%, FEV1/FVC: 84%).    Spirometry consistent with possible restrictive disease. Xopenex four puffs via MDI treatment given in clinic with significant improvement in FEV1 per ATS criteria.  Allergy Studies:     Airborne Adult Perc - 07/18/21 1300     Time Antigen Placed 1143    Allergen Manufacturer Waynette Buttery  Location Back    Number of Test 59    Panel 1 Select    1. Control-Buffer 50% Glycerol Negative    2. Control-Histamine 1 mg/ml 2+    3. Albumin saline Negative    4. Bahia Negative    5. French Southern Territories Negative    6. Johnson Negative    7. Kentucky Blue Negative    8. Meadow Fescue Negative    9. Perennial Rye Negative    10. Sweet Vernal Negative    11. Timothy Negative    12. Cocklebur Negative    13. Burweed Marshelder  Negative    14. Ragweed, short Negative    15. Ragweed, Giant Negative    16. Plantain,  English Negative    17. Lamb's Quarters Negative    18. Sheep Sorrell Negative    19. Rough Pigweed Negative    20. Marsh Elder, Rough Negative    21. Mugwort, Common Negative    22. Ash mix Negative    23. Birch mix Negative    24. Beech American Negative    25. Box, Elder Negative    26. Cedar, red Negative    27. Cottonwood, Guinea[place]iss[place] Negative    28. Elm mix Negative    29. Hickory Negative    30. Maple mix Negative    31. Oak, [place] mix Negative    32. Pecan Pollen Negative    33. Pine mix Negative    34. Sycamore Eastern Negative    35. Walnut, Black Pollen Negative    36. Alternaria alternata Negative    37. Cladosporium Herbarum Negative    38. Aspergillus mix Negative    39. Penicillium mix Negative    40. Bipolaris sorokiniana (Helminthosporium) Negative    41. Drechslera spicifera (Curvularia) Negative    42. Mucor plumbeus Negative    43. Fusarium moniliforme Negative    44. Aureobasidium pullulans (pullulara) Negative    45. Rhizopus oryzae Negative    46. Botrytis cinera Negative    47. Epicoccum nigrum Negative    48. Phoma betae Negative    49. Candida Albicans Negative    50. Trichophyton mentagrophytes Negative    51. Mite, D Farinae  5,000 AU/ml 4+    52. Mite, D Pteronyssinus  5,000 AU/ml 3+    53. Cat Hair 10,000 BAU/ml Negative    54.  Dog Epithelia Negative    55. Mixed Feathers Negative    56. Horse Epithelia Negative    57. Cockroach, German Negative    58. Mouse Negative    59. Tobacco Leaf Negative             Intradermal - 07/18/21 1205     Time Antigen Placed 1130    Allergen Manufacturer Waynette Buttery    Location Arm    Number of Test 14    Intradermal Select    Control Negative    French Southern Territ[place] Negative    Johnson Negative    7 Grass Negative    Ragweed mix 1+    Weed mix 1+    Tree mix Negative    Mold 1 3+    Mold 2 3+    Mold 3 2+    Mold 4  3+    Cat 4+    Dog Negative    Cockroach 4+             Food Adult Perc - 07/18/21 1100     Time Antigen Placed 1045    Allergen Manufacturer Waynette Buttery  Location Back    Number of allergen test 17     Control-buffer 50% Glycerol Negative    Control-Histamine 1 mg/ml 2+    1. Peanut Negative    2. Soybean Negative    3. Wheat Negative    4. Sesame Negative    5. Milk, cow Negative    6. Egg White, Chicken Negative    7. Casein Negative    8. Shellfish Mix Negative    9. Fish Mix Negative    10. Cashew Negative    11. Pecan Food Negative    12. Walnut Food Negative    13. Almond Negative    14. Hazelnut Negative    15. [place] nut Negative    16. Coconut Negative    17. Pistachio Negative             Allergy testing results were read and interpreted by myself, documented by clinical staff.         Malachi Bonds, MD [facility]

## 2021-07-18 NOTE — Patient Instructions (Addendum)
1. Chronic rhinitis - Testing today showed: ragweed, weeds, indoor molds, outdoor molds, dust mites, cat, and cockroach. - Copy of test results provided.  - Avoidance measures provided. - Stop taking: all of your current medications - Start taking: Xyzal (levocetirizine)  tablet once daily, Nasacort (triamcinolone) one spray per nostril daily (AIM FOR EAR ON EACH SIDE), and Astelin (azelastine) 2 sprays per nostril 1-2 times daily as needed - You can use an extra dose of the antihistamine, if needed, for breakthrough symptoms.  - Consider nasal saline rinses 1-2 times daily to remove allergens from the nasal cavities as well as help with mucous clearance (this is especially helpful to do before the nasal sprays are given) - Consider allergy shots as a means of long-term control. - Allergy shots "re-train" and "reset" the immune system to ignore environmental allergens and decrease the resulting immune response to those allergens (sneezing, itchy watery eyes, runny nose, nasal congestion, etc).    - Allergy shots improve symptoms in 75-85% of patients.  - We can discuss more at the next appointment if the medications are not working for you.  2. Pruritus - Hopefully the twice daily new antihistamine will help with the itching. - We can do labs if needed at the next visit. - Testing to all of the most common food was negative.   3. Chronic cough - Spirometry looked somewhat normal, but it did get better with the Xopenex treatment. - We are going to start an inhaled steroid to see if this helps. - Spacer use reviewed. - Daily controller medication(s): Flovent 2 puffs twice daily with spacer - Prior to physical activity: albuterol 2 puffs 10-15 minutes before physical activity. - Rescue medications: albuterol 4 puffs every 4-6 hours as needed - Asthma control goals:  * Full participation in all desired activities (may need albuterol before activity) * Albuterol use two time or less a  week on average (not counting use with activity) * Cough interfering with sleep two time or less a month * Oral steroids no more than once a year * No hospitalizations  4. Return in about 6 weeks (around 08/29/2021).    Please inform us of any Emergency Department visits, hospitalizations, or changes in symptoms. Call us before going to the ED for breathing or allergy symptoms since we might be able to fit you in for a sick visit. Feel free to contact us anytime with any questions, problems, or concerns.  It was a pleasure to meet you today!  Websites that have reliable patient information: 1. American Academy of Asthma, Allergy, and Immunology: www.aaaai.org 2. Food Allergy Research and Education (FARE): foodallergy.org 3. Mothers of Asthmatics: http://www.asthmacommunitynetwork.org 4. American College of Allergy, Asthma, and Immunology: www.acaai.org   COVID-19 Vaccine Information can be found at: [email or web address] For questions related to vaccine distribution or appointments, please email [email or web address] or call [phone].   We realize that you might be concerned about having an allergic reaction to the COVID19 vaccines. To help with that concern, WE ARE OFFERING THE COVID19 VACCINES IN OUR OFFICE! Ask the front desk for dates!     Like [place] on [employer] and Instagram for our latest updates!      A healthy democracy works best when [employer] participate! Make sure you are registered to vote! If you have moved or changed any of your contact information, you will need to get this updated before voting!  In some cases, you MAY be able to register to vote online: [email or web address]  Airborne Adult Perc - 07/18/21 1143     Time Antigen Placed 1143    Allergen Manufacturer Waynette Buttery    Location Back    Number of Test 59    Panel 1 Select             Intradermal - 07/18/21  1205     Time Antigen Placed 1130    Allergen Manufacturer Waynette Buttery    Location Arm    Number of Test 14    Intradermal Select    Control Negative    [place] Negative    [place] Negative    7 Grass Negative    Ragweed mix 1+    Weed mix 1+    Tree mix Negative    Mold 1 3+    Mold 2 3+    Mold 3 2+    Mold 4 3+    Cat 4+    Dog Negative    Cockroach 4+             Food Adult Perc - 07/18/21 1100     Time Antigen Placed 1045    Allergen Manufacturer Greer    Location Back    Number of allergen test 17     Control-buffer 50% Glycerol Negative    Control-Histamine 1 mg/ml 2+    1. Peanut Negative    2. Soybean Negative    3. Wheat Negative    4. Sesame Negative    5. Milk, cow Negative    6. Egg White, Chicken Negative    7. Casein Negative    8. Shellfish Mix Negative    9. Fish Mix Negative    10. Cashew Negative    11. Pecan Food Negative    12. Walnut Food Negative    13. Almond Negative    14. Hazelnut Negative    15. [place] nut Negative    16. Coconut Negative    17. Pistachio Negative             Reducing Pollen Exposure  The American Academy of Allergy, Asthma and Immunology suggests the following steps to reduce your exposure to pollen during allergy seasons.    Do not hang sheets or clothing out to dry; pollen may collect on these items. Do not mow lawns or spend time around freshly cut grass; mowing stirs up pollen. Keep windows closed at night.  Keep car windows closed while driving. Minimize morning activities outdoors, a time when pollen counts are usually at their highest. Stay indoors as much as possible when pollen counts or humidity is high and on windy days when pollen tends to remain in the air longer. Use air conditioning when possible.  Many air conditioners have filters that trap the pollen spores. Use a HEPA room air filter to remove pollen form the indoor air you breathe.  Control of Mold Allergen   Mold and fungi can grow on  a variety of surfaces provided certain temperature and moisture conditions exist.  Outdoor molds grow on plants, decaying vegetation and soil.  The major outdoor mold, Alternaria and Cladosporium, are found in very high numbers during hot and dry conditions.  Generally, a late Summer - Fall peak is seen for common outdoor fungal spores.  Rain will temporarily lower outdoor mold spore count, but counts rise rapidly when the rainy period ends.  The most important indoor molds are Aspergillus and Penicillium.  Dark, humid and poorly ventilated basements are ideal sites for mold growth.  The next  most common sites of mold growth are the bathroom and the kitchen.  Outdoor (Seasonal) Mold Control  Positive outdoor molds via skin testing: Alternaria, Cladosporium, Bipolaris (Helminthsporium), Drechslera (Curvalaria), and Mucor  Use air conditioning and keep windows closed Avoid exposure to decaying vegetation. Avoid leaf raking. Avoid grain handling. Consider wearing a face mask if working in moldy areas.    Indoor (Perennial) Mold Control   Positive indoor molds via skin testing: Aspergillus, Penicillium, Fusarium, Aureobasidium (Pullulara), and Rhizopus  Maintain humidity below 50%. Clean washable surfaces with 5% bleach solution. Remove sources e.g. contaminated carpets.    Control of Dust Mite Allergen    Dust mites play a major role in allergic asthma and rhinitis.  They occur in environments with high humidity wherever human skin is found.  Dust mites absorb humidity from the atmosphere (ie, they do not drink) and feed on organic matter (including shed human and animal skin).  Dust mites are a microscopic type of insect that you cannot see with the naked eye.  High levels of dust mites have been detected from mattresses, pillows, carpets, upholstered furniture, bed covers, clothes, soft toys and any woven material.  The principal allergen of the dust mite is found in its feces.  A gram of  dust may contain 1,000 mites and 250,000 fecal particles.  Mite antigen is easily measured in the air during house cleaning activities.  Dust mites do not bite and do not cause harm to humans, other than by triggering allergies/asthma.    Ways to decrease your exposure to dust mites in your home:  Encase mattresses, box springs and pillows with a mite-impermeable barrier or cover   Wash sheets, blankets and drapes weekly in hot water (130 F) with detergent and dry them in a dryer on the hot setting.  Have the room cleaned frequently with a vacuum cleaner and a damp dust-mop.  For carpeting or rugs, vacuuming with a vacuum cleaner equipped with a high-efficiency particulate air (HEPA) filter.  The dust mite allergic individual should not be in a room which is being cleaned and should wait 1 hour after cleaning before going into the room. Do not sleep on upholstered furniture (eg, couches).   If possible removing carpeting, upholstered furniture and drapery from the home is ideal.  Horizontal blinds should be eliminated in the rooms where the person spends the most time (bedroom, study, television room).  Washable vinyl, roller-type shades are optimal. Remove all non-washable stuffed toys from the bedroom.  Wash stuffed toys weekly like sheets and blankets above.   Reduce indoor humidity to less than 50%.  Inexpensive humidity monitors can be purchased at most hardware stores.  Do not use a humidifier as can make the problem worse and are not recommended.  Control of Dog or Cat Allergen  Avoidance is the best way to manage a dog or cat allergy. If you have a dog or cat and are allergic to dog or cats, consider removing the dog or cat from the home. If you have a dog or cat but dont want to find it a new home, or if your family wants a pet even though someone in the household is allergic, here are some strategies that may help keep symptoms at bay:  Keep the pet out of your bedroom and restrict it to  only a few rooms. Be advised that keeping the dog or cat in only one room will not limit the allergens to that room. Dont pet, hug or  kiss the dog or cat; if you do, wash your hands with soap and water. High-efficiency particulate air (HEPA) cleaners run continuously in a bedroom or living room can reduce allergen levels over time. Regular use of a high-efficiency vacuum cleaner or a central vacuum can reduce allergen levels. Giving your dog or cat a bath at least once a week can reduce airborne allergen.  Control of Cockroach Allergen  Cockroach allergen has been identified as an important cause of acute attacks of asthma, especially in urban settings.  There are fifty-five species of cockroach that exist in the [place], however only three, the [place], [place] species produce allergen that can affect patients with Asthma.  Allergens can be obtained from fecal particles, egg casings and secretions from cockroaches.    Remove food sources. Reduce access to water. Seal access and entry points. Spray runways with 0.5-1% Diazinon or Chlorpyrifos Blow boric acid power under stoves and refrigerator. Place bait stations (hydramethylnon) at feeding sites.  Allergy Shots   Allergies are the result of a chain reaction that starts in the immune system. Your immune system controls how your body defends itself. For instance, if you have an allergy to pollen, your immune system identifies pollen as an invader or allergen. Your immune system overreacts by producing antibodies called Immunoglobulin E (IgE). These antibodies travel to cells that release chemicals, causing an allergic reaction.  The concept behind allergy immunotherapy, whether it is received in the form of shots or tablets, is that the immune system can be desensitized to specific allergens that trigger allergy symptoms. Although it requires time and patience, the payback can be long-term relief.  How Do Allergy Shots  Work?  Allergy shots work much like a vaccine. Your body responds to injected amounts of a particular allergen given in increasing doses, eventually developing a resistance and tolerance to it. Allergy shots can lead to decreased, minimal or no allergy symptoms.  There generally are two phases: build-up and maintenance. Build-up often ranges from three to six months and involves receiving injections with increasing amounts of the allergens. The shots are typically given once or twice a week, though more rapid build-up schedules are sometimes used.  The maintenance phase begins when the most effective dose is reached. This dose is different for each person, depending on how allergic you are and your response to the build-up injections. Once the maintenance dose is reached, there are longer periods between injections, typically two to four weeks.  Occasionally doctors give cortisone-type shots that can temporarily reduce allergy symptoms. These types of shots are different and should not be confused with allergy immunotherapy shots.  Who Can Be Treated with Allergy Shots?  Allergy shots may be a good treatment approach for people with allergic rhinitis (hay fever), allergic asthma, conjunctivitis (eye allergy) or stinging insect allergy.   Before deciding to begin allergy shots, you should consider:   The length of allergy season and the severity of your symptoms  Whether medications and/or changes to your environment can control your symptoms  Your desire to avoid long-term medication use  Time: allergy immunotherapy requires a major time commitment  Cost: may vary depending on your insurance coverage  Allergy shots for children age 62 and older are effective and often well tolerated. They might prevent the onset of new allergen sensitivities or the progression to asthma.  Allergy shots are not started on patients who are pregnant but can be continued on patients who become pregnant while  receiving  them. In some patients with other medical conditions or who take certain common medications, allergy shots may be of risk. It is important to mention other medications you talk to your allergist.   When Will I Feel Better?  Some may experience decreased allergy symptoms during the build-up phase. For others, it may take as long as 12 months on the maintenance dose. If there is no improvement after a year of maintenance, your allergist will discuss other treatment options with you.  If you arent responding to allergy shots, it may be because there is not enough dose of the allergen in your vaccine or there are missing allergens that were not identified during your allergy testing. Other reasons could be that there are high levels of the allergen in your environment or major exposure to non-allergic triggers like tobacco smoke.  What Is the Length of Treatment?  Once the maintenance dose is reached, allergy shots are generally continued for three to five years. The decision to stop should be discussed with your allergist at that time. Some people may experience a permanent reduction of allergy symptoms. Others may relapse and a longer course of allergy shots can be considered.  What Are the Possible Reactions?  The two types of adverse reactions that can occur with allergy shots are local and systemic. Common local reactions include very mild redness and swelling at the injection site, which can happen immediately or several hours after. A systemic reaction, which is less common, affects the entire body or a particular body system. They are usually mild and typically respond quickly to medications. Signs include increased allergy symptoms such as sneezing, a stuffy nose or hives.  Rarely, a serious systemic reaction called anaphylaxis can develop. Symptoms include swelling in the throat, wheezing, a feeling of tightness in the chest, nausea or dizziness. Most serious systemic reactions  develop within 30 minutes of allergy shots. This is why it is strongly recommended you wait in your doctors office for 30 minutes after your injections. Your allergist is trained to watch for reactions, and his or her staff is trained and equipped with the proper medications to identify and treat them.  Who Should Administer Allergy Shots?  The preferred location for receiving shots is your prescribing allergists office. Injections can sometimes be given at another facility where the physician and staff are trained to recognize and treat reactions, and have received instructions by your prescribing allergist.

## 2021-07-24 ENCOUNTER — Other Ambulatory Visit: Payer: Self-pay | Admitting: Nurse Practitioner

## 2021-07-24 ENCOUNTER — Other Ambulatory Visit: Payer: Self-pay | Admitting: *Deleted

## 2021-07-24 ENCOUNTER — Encounter: Payer: Self-pay | Admitting: Nurse Practitioner

## 2021-07-24 DIAGNOSIS — F419 Anxiety disorder, unspecified: Secondary | ICD-10-CM

## 2021-07-24 MED ORDER — SERTRALINE HCL 50 MG PO TABS: ORAL_TABLET | ORAL | 3 refills | Status: DC

## 2021-07-24 NOTE — Patient Instructions (Signed)
Visit Information  Ms. Sherpa was given information about Medicaid Managed Care team care coordination services as a part of their [facility] Community [facility] Medicaid benefit. Jonita AlbeeShannon Harty verbally consented to engagement with the [facility] team.   If you are experiencing a medical emergency, please call 911 or report to your local emergency department or urgent care.   If you have a non-emergency medical problem during routine business hours, please contact your provider's office and ask to speak with a nurse.   For questions related to your [facility] Community [facility] Medicaid, please call: [phone] or visit the homepage here: [email or web address]  If you would like to schedule transportation through your [facility] Community Plan Medicaid, please call the following number at least 2 days in advance of your appointment: [phone].  Rides for urgent appointments can also be made after hours by calling Member Services.  Call the Behavioral Health Crisis Line at [phone], at any time, 24 hours a day, 7 days a week. If you are in danger or need immediate medical attention call 911.  If you would like help to quit smoking, call 1-800-QUIT-NOW ((770) [phone]) OR Espaol: 1-855-Djelo-Ya (4-132-440-1027(1-(714)[phone]) o para ms informacin haga clic aqu or Text READY to 253-664200-400 to register via text  Ms. Brouillet - following are the goals we discussed in your visit today:   Goals Addressed               This Visit's Progress     Begin and Stick with Counseling-Depression/Anxiety and ADHD (pt-stated)        Timeframe:  Long-Range Goal Priority:  High Start Date:  07/03/21                           Expected End Date:             10/23/21          Follow Up Date 08/07/21    -continue with medications as prescribed -[facility] ([facility]) -review material  mailed to you for ADHD evaluation - keep 90 percent of counseling appointments  -call 911 or [phone] as discussed for emergencies/support   Why is this important?   Beating depression may take some time.  If you don't feel better right away, don't give up on your treatment plan.    Notes:           Patient verbalizes understanding of instructions and care plan provided today and agrees to view in MyChart. Active MyChart status confirmed with patient.    Licensed Clinical Social Worker will follow up 08/07/21 Reece LevyJanet Ashwath Lasch MSW, LCSW Licensed Clinical Social Worker Managed Medicaid Coverage   [phone]  Following is a copy of your plan of care:  Care Plan : LCSW Plan of Care  Updates made by Buck Mamaldwell, Lennart Gladish P, LCSW since 07/24/2021 12:00 AM     Problem: Symptoms (Depression, Anxiety, ADHD)   Priority: High     Long-Range Goal: Symptoms Monitored and Managed   Start Date: 07/03/2021  Expected End Date: 10/23/2021  This Visit's Progress: On track  Recent Progress: On track  Priority: High  Note:   Current Barriers:  Mental Health Concerns  and Lacks knowledge of community resource:     CSW Clinical Goal(s):  Patient  verbalize understanding of plan for management of Anxiety, Depression, and ADHD  through collaboration with Clinical Social Worker, provider, and care team.   Interventions: 07/24/21-  CSW spoke with pt who reports having some consistent "dips" in her depression state; wants to ask PCP about increasing her dose. Validated her inquiry and plans to email PCP.  Pt denies SI/HI-  She is on the waiting list for an appointment [facility] for counseling support- advised her to expect a call from them to schedule.  Pt inquiring about getting a vision evaluation by an Eye MD that takes Medicaid due to concern for glaucoma- stating her sister has recently been diagnosed with early onset glaucoma at age of 32yo.  Will pursue resources for vision care and advise pt of  this asap.  1:1 collaboration with primary care provider regarding development and update of comprehensive plan of care as evidenced by provider attestation and co-signature Inter-disciplinary care team collaboration (see longitudinal plan of care) Evaluation of current treatment plan related to  self management and patient's adherence to plan as established by provider Review resources, discussed options and provided patient information about  Advance Directive Education (discussed and encouraged pt to consider completion)  Patient Self-Care Activities: -continue with medications as prescribed -expect call from [facility] (counseling) -review material mailed to you for ADHD evaluation - keep 90 percent of counseling appointments  -call 911 or [phone] as discussed for emergencies/support

## 2021-07-24 NOTE — Telephone Encounter (Signed)
Please advise pt seen 06/25/21

## 2021-07-24 NOTE — Patient Outreach (Signed)
Medicaid Managed Care Social Work Note  07/24/2021 Name:  Kristin Baker MRN:  [id number] DOB:  [birth year]  Kristin Baker is an 31 y.o. year old female who is a primary patient of Kristin Beers, FNP.  The Medicaid Managed Care Coordination team was consulted for assistance with:  Community Resources  Mental Health Counseling and Resources  Ms. Schweigert was given information about Medicaid Managed Care Coordination team services today. Kristin Baker Patient agreed to services and verbal consent obtained.  Engaged with patient  for by telephone forfollow up visit in response to referral for case management and/or care coordination services.   Assessments/Interventions:  Review of past medical history, allergies, medications, health status, including review of consultants reports, laboratory and other test data, was performed as part of comprehensive evaluation and provision of chronic care management services.  SDOH: (Social Determinant of Health) assessments and interventions performed:   Advanced Directives Status:  Not addressed in this encounter.  Care Plan                 Allergies  Allergen Reactions   Flexeril [Cyclobenzaprine] Hives   Lactose Intolerance (Gi) Diarrhea   Honeysuckle Flower [Lonicera] Rash   Mixed Grasses Rash    Medications Reviewed Today     Reviewed by Ardean Larsen, CMA (Certified Medical Assistant) on 07/18/21 at [phone]  Med List Status: <None>   Medication Order Taking? Sig Documenting Provider Last Dose Status Informant  Galcanezumab-gnlm (EMGALITY) 120 MG/ML SOAJ [id number] Yes Inject 1 pen into the skin every 28 (twenty-eight) days. Sater, [name], MD Taking Active   hydrochlorothiazide (HYDRODIURIL) 12.5 MG tablet [id number] Yes Take 1 tablet (12.5 mg total) by mouth daily. Kristin Beers, FNP Taking Active   NP THYROID 90 MG tablet [id number] Yes Take 1 tablet (90 mg total) by mouth daily. Kristin Beers, FNP Taking  Active   rizatriptan (MAXALT-MLT) 10 MG disintegrating tablet [id number] Yes Take 1 tablet (10 mg total) by mouth as needed for migraine. May repeat in 2 hours if needed.  No more than 2/day ot 10/month Sater, [name], MD Taking Active   sertraline (ZOLOFT) 50 MG tablet [id number] Yes Take 1 tablet (50 mg total) by mouth daily. Kristin Beers, FNP Taking Active   Vitamin D, Ergocalciferol, (DRISDOL) 1.25 MG (50000 UNIT) CAPS capsule [id number] Yes Take 1 capsule (50,000 Units total) by mouth every 7 (seven) days. Kristin Beers, FNP Taking Active             Patient Active Problem List   Diagnosis Date Noted   Seasonal and perennial allergic rhinitis 07/18/2021   Hyperlipidemia 06/25/2021   H/O multiple allergies 05/14/2021   Anxiety and depression 05/14/2021   Morbid obesity (HCC) 05/14/2021   Hypertension 05/14/2021   Allergies 05/14/2021   Chronic bilateral low back pain without sciatica 05/14/2021   Vitamin D deficiency 01/15/2021   Chronic migraine w/o aura, not intractable, w/o stat migr 01/15/2021   Chronic daily headache 01/15/2021   White matter abnormality on MRI of brain 01/15/2021   Acquired hypothyroidism 07/10/2020   Essential hypertension 07/10/2020   DDD (degenerative disc disease), lumbar 01/11/2020    Conditions to be addressed/monitored per PCP order:  Depression and vision concerns  Care Plan : LCSW Plan of Care  Updates made by Buck Mam, LCSW since 07/24/2021 12:00 AM     Problem: Symptoms (Depression, Anxiety, ADHD)   Priority: High     Long-Range Goal: Symptoms Monitored and Managed  Start Date: 07/03/2021  Expected End Date: 10/23/2021  This Visit's Progress: On track  Recent Progress: On track  Priority: High  Note:   Current Barriers:  Mental Health Concerns  and Lacks knowledge of community resource:     CSW Clinical Goal(s):  Patient  verbalize understanding of plan for management of Anxiety, Depression, and ADHD  through  collaboration with Clinical Social Worker, provider, and care team.   Interventions: 07/24/21- CSW spoke with pt who reports having some consistent "dips" in her depression state; wants to ask PCP about increasing her dose. Validated her inquiry and plans to email PCP.  Pt denies SI/HI-  She is on the waiting list for an appointment with [facility] for counseling support- advised her to expect a call from them to schedule.  Pt inquiring about getting a vision evaluation by an Eye MD that takes Medicaid due to concern for glaucoma- stating her sister has recently been diagnosed with early onset glaucoma at age of 32yo.  Will pursue resources for vision care and advise pt of this asap.  1:1 collaboration with primary care provider regarding development and update of comprehensive plan of care as evidenced by provider attestation and co-signature Inter-disciplinary care team collaboration (see longitudinal plan of care) Evaluation of current treatment plan related to  self management and patient's adherence to plan as established by provider Review resources, discussed options and provided patient information about  Advance Directive Education (discussed and encouraged pt to consider completion)  Patient Self-Care Activities: -continue with medications as prescribed -expect call from [facility] [employer] (counseling) -review material mailed to you for ADHD evaluation - keep 90 percent of counseling appointments  -call 911 or [phone] as discussed for emergencies/support     Follow up:  Patient agrees to Care Plan and Follow-up.  Plan: The Managed Medicaid care management team will reach out to the patient again over the next 20 days.  Date/time of next scheduled Social Work care management/care coordination outreach:  08/07/21  Eduard Clos MSW, LCSW Licensed Clinical Social Worker Managed Medicaid Coverage   [phone]

## 2021-07-26 NOTE — Telephone Encounter (Signed)
Spoke with pt advised of Fola's message and scheduled appt. Pt verbalized understanding ?

## 2021-08-02 ENCOUNTER — Ambulatory Visit: Payer: Medicaid Other

## 2021-08-06 ENCOUNTER — Other Ambulatory Visit: Payer: Self-pay | Admitting: Nurse Practitioner

## 2021-08-06 DIAGNOSIS — R5383 Other fatigue: Secondary | ICD-10-CM

## 2021-08-06 DIAGNOSIS — E039 Hypothyroidism, unspecified: Secondary | ICD-10-CM

## 2021-08-07 ENCOUNTER — Other Ambulatory Visit: Payer: Self-pay | Admitting: Nurse Practitioner

## 2021-08-07 ENCOUNTER — Other Ambulatory Visit: Payer: Self-pay | Admitting: *Deleted

## 2021-08-07 DIAGNOSIS — E039 Hypothyroidism, unspecified: Secondary | ICD-10-CM

## 2021-08-07 DIAGNOSIS — R5383 Other fatigue: Secondary | ICD-10-CM

## 2021-08-07 MED ORDER — NP THYROID 90 MG PO TABS: 90.0000 mg | ORAL_TABLET | Freq: Every day | ORAL | 0 refills | Status: DC

## 2021-08-08 NOTE — Patient Outreach (Signed)
?Medicaid Managed Care ?Social Work Note ? ?08/08/2021 ?Name:  Kristin Baker MRN:  [id number] DOB:  [birth year] ? ?Kristin Baker is an 31 y.o. year old female who is a primary patient of Kristin Beers, FNP.  [facility] Managed Care Coordination team was consulted for assistance with:  Mental Health Counseling and Resources ? ?Kristin Baker was given information about Medicaid Managed Care Coordination team services today. Kristin Baker Patient agreed to services and verbal consent obtained. ? ?Engaged with patient  for by telephone forfollow up visit in response to referral for case management and/or care coordination services.  ? ?Assessments/Interventions:  Review of past medical history, allergies, medications, health status, including review of consultants reports, laboratory and other test data, was performed as part of comprehensive evaluation and provision of chronic care management services. ? ?SDOH: (Social Determinant of Health) assessments and interventions performed: ?SDOH Interventions   ? ?Flowsheet Row Most Recent Value  ?SDOH Interventions   ?Depression Interventions/Treatment  Medication, Counseling, Currently on Treatment  ? ?  ? ? ?Advanced Directives Status:  Not addressed in this encounter. ? ?Care Plan ?                ?Allergies  ?Allergen Reactions  ? Flexeril [Cyclobenzaprine] Hives  ? Lactose Intolerance (Gi) Diarrhea  ? Honeysuckle Flower [Lonicera] Rash  ? Mixed Grasses Rash  ? ? ?Medications Reviewed Today   ? ? Reviewed by Kristin Baker, CMA (Certified Medical Assistant) on 07/18/21 at [phone]  Med List Status: <None>  ? ?Medication Order Taking? Sig Documenting Provider Last Dose Status Informant  ?Galcanezumab-gnlm (EMGALITY) 120 MG/ML SOAJ [id number] Yes Inject 1 pen into the skin every 28 (twenty-eight) days. Baker, Kristin Furl, MD Taking Active   ?hydrochlorothiazide (HYDRODIURIL) 12.5 MG tablet [id number] Yes Take 1 tablet (12.5 mg total) by mouth daily. Kristin Beers, FNP Taking Active   ?NP THYROID 90 MG tablet [id number] Yes Take 1 tablet (90 mg total) by mouth daily. Kristin Beers, FNP Taking Active   ?rizatriptan (MAXALT-MLT) 10 MG disintegrating tablet [id number] Yes Take 1 tablet (10 mg total) by mouth as needed for migraine. May repeat in 2 hours if needed.  No more than 2/day ot 10/month Baker, Kristin Furl, MD Taking Active   ?sertraline (ZOLOFT) 50 MG tablet [id number] Yes Take 1 tablet (50 mg total) by mouth daily. Kristin Beers, FNP Taking Active   ?Vitamin D, Ergocalciferol, (DRISDOL) 1.25 MG (50000 UNIT) CAPS capsule [id number] Yes Take 1 capsule (50,000 Units total) by mouth every 7 (seven) days. Kristin Beers, FNP Taking Active   ? ?  ?  ? ?  ? ? ?Patient Active Problem List  ? Diagnosis Date Noted  ? Seasonal and perennial allergic rhinitis 07/18/2021  ? Hyperlipidemia 06/25/2021  ? H/O multiple allergies 05/14/2021  ? Anxiety and depression 05/14/2021  ? Morbid obesity (HCC) 05/14/2021  ? Hypertension 05/14/2021  ? Allergies 05/14/2021  ? Chronic bilateral low back pain without sciatica 05/14/2021  ? Vitamin D deficiency 01/15/2021  ? Chronic migraine w/o aura, not intractable, w/o stat migr 01/15/2021  ? Chronic daily headache 01/15/2021  ? White matter abnormality on MRI of brain 01/15/2021  ? Acquired hypothyroidism 07/10/2020  ? Essential hypertension 07/10/2020  ? DDD (degenerative disc disease), lumbar 01/11/2020  ? ? ?Conditions to be addressed/monitored per PCP order:  Depression ? ?Care Plan : LCSW Plan of Care  ?Updates made by Kristin Mam, LCSW since 08/08/2021 12:00 AM  ?  ? ?  Problem: Symptoms (Depression, Anxiety, ADHD)   ?Priority: High  ?  ? ?Long-Range Goal: Symptoms Monitored and Managed   ?Start Date: 07/03/2021  ?Expected End Date: 10/23/2021  ?This Visit's Progress: On track  ?Recent Progress: On track  ?Priority: High  ?Note:   ?Current Barriers:  ?Mental Health Concerns  and Lacks knowledge of community resource:     ? ?CSW Clinical Goal(s):  ?Patient  verbalize understanding of plan for management of Anxiety, Depression, and ADHD  through collaboration with Clinical Social Worker, provider, and care team.  ? ?Interventions: ?08/07/21- CSW spoke with pt who reports she was not able to be seen at [facility] due to out of network for her insurance. CSW suggested pt call the Customer Service # on her insurance card to seek options- CSW will also research options with colleague who is most familiar with Managed Medicaid. Similarly, pt reports she has not been able to find a Psychiatrist or an eye doctor that takes her insurance.  CSW completed a depression screening with pt; pt scoring "21"  which is much higher than last month ("8"). Pt denies SI/HI. She is in the midst of moving to a new home (down the street) and feels she "hit a brick wall and don't know what to do".  Pt does not feel the RX is helping her depression and states it may be making it worse. Will alert PCP to this.  Pt admits to feeling "consumed" and also states "I have hope".   ? ?Depression screen [facility] 2/9 08/07/2021 07/03/2021 06/25/2021 05/14/2021 09/05/2020  ?Decreased Interest 0  ?Down, Depressed, Hopeless 3 0 0 3 0  ?PHQ - 2 Score 0  ?Altered sleeping ?Tired, decreased energy ?Change in appetite 3 0 0 3 1  ?Feeling bad or failure about yourself  1 0 0 3 0  ?Trouble concentrating ?Moving slowly or fidgety/restless 3 0 0 3 1  ?Suicidal thoughts 0 0 0 0 0  ?PHQ-9 Score ?Difficult doing work/chores Somewhat difficult Somewhat difficult Somewhat difficult Very difficult -  ?  ?07/24/21- CSW spoke with pt who reports having some consistent "dips" in her depression state; wants to ask PCP about increasing her dose. Validated her inquiry and plans to email PCP.  ?Pt denies SI/HI-  ?She is on the waiting list for an appointment with [facility] for counseling support- advised her to  expect a call from them to schedule.  Pt inquiring about getting a vision evaluation by an Eye MD that takes Medicaid due to concern for glaucoma- stating her sister has recently been diagnosed with early onset glaucoma at age of 32yo.  Will pursue resources for vision care and advise pt of this asap.  ?1:1 collaboration with primary care provider regarding development and update of comprehensive plan of care as evidenced by provider attestation and co-signature ?Inter-disciplinary care team collaboration (see longitudinal plan of care) ?Evaluation of current treatment plan related to  self management and patient's adherence to plan as established by provider ?Review resources, discussed options and provided patient information about  ?Advance Directive Education (discussed and encouraged pt to consider completion) ? ?Patient Self-Care Activities: ?-continue with medications as prescribed ?-call insurance provider # and inquire about in-network options for Psychiatry, Optometry and attention specialist ?-plan for appointment with [facility] (counseling ?--review material mailed  to you for ADHD evaluation ?- keep 90 percent of counseling appointments  ?-call 911 or [phone] as discussed for emergencies/support ?  ? ? ?Follow up:  Patient agrees to Care Plan and Follow-up. ? ?Plan: The Managed Medicaid care management team will reach out to the patient again over the next 5 days. ? ?Date/time of next scheduled Social Work care management/care coordination outreach:  08/09/21 ?Reece Levy MSW, LCSW ?Licensed Clinical Social Worker ?Managed Medicaid Coverage   ?[phone]  ?

## 2021-08-08 NOTE — Patient Instructions (Signed)
Visit Information ? ?Ms. Burchill was given information about Medicaid Managed Care team care coordination services as a part of their [facility] Medicaid benefit. [name] verbally consented to engagement with the [facility] Managed Care team.  ? ?If you are experiencing a medical emergency, please call 911 or report to your local emergency department or urgent care.  ? ?If you have a non-emergency medical problem during routine business hours, please contact your provider's office and ask to speak with a nurse.  ? ?For questions related to your [facility], please call: [phone] or visit the homepage here: [email or web address] ? ?If you would like to schedule transportation through your [facility], please call the following number at least 2 days in advance of your appointment: [phone]. ? Rides for urgent appointments can also be made after hours by calling Member Services. ? ?Call the Behavioral Health Crisis Line at [phone], at any time, 24 hours a day, 7 days a week. If you are in danger or need immediate medical attention call 911. ? ?If you would like help to quit smoking, call 1-800-QUIT-NOW ((438)147-4108) OR Espa?ol: 1-855-D?jelo-Ya (306)509-2167) o para m?s informaci?n haga clic aqu? or Text READY to 200-400 to register via text ? ?Ms. Tay - following are the goals we discussed in your visit today:  ? Goals Addressed   ? ?  ?  ?  ?  ?  ? This Visit's Progress  ?   Begin and Stick with Counseling-Depression/Anxiety and ADHD (pt-stated)     ?   Timeframe:  Long-Range Goal ?Priority:  High ?Start Date:  07/03/21                           ?Expected End Date:             10/23/21         ? ?Follow Up Date 08/09/21  ?  ?-continue with medications as prescribed ?-call insurance provider # and inquire about in-network options for Psychiatry, Optometry and  attention specialist ?-plan for appointment with [facility] (counseling ?--review material mailed to you for ADHD evaluation ?- keep 90 percent of counseling appointments  ?-call 911 or [phone] as discussed for emergencies/support ?  ?Why is this important?   ?Beating depression may take some time.  ?If you don't feel better right away, don't give up on your treatment plan.  ?  ?Notes:  ?  ? ?  ? ?  ? ?Patient verbalizes understanding of instructions and care plan provided today and agrees to view in MyChart. Active MyChart status confirmed with patient.   ? ?Child psychotherapist will follow up with pt regarding resources for vision, Psychiatry and ADHD. Marland Kitchen  ? ?Reece Levy MSW, LCSW ?Licensed Clinical Social Worker ?Managed Medicaid Coverage   ?[phone]  ? ?Following is a copy of your plan of care:  ?Care Plan : LCSW Plan of Care  ?Updates made by Buck Mam, LCSW since 08/08/2021 12:00 AM  ?  ? ?Problem: Symptoms (Depression, Anxiety, ADHD)   ?Priority: High  ?  ? ?Long-Range Goal: Symptoms Monitored and Managed   ?Start Date: 07/03/2021  ?Expected End Date: 10/23/2021  ?This Visit's Progress: On track  ?Recent Progress: On track  ?Priority: High  ?Note:   ?Current Barriers:  ?Mental Health Concerns  and Lacks knowledge of community resource:    ? ?CSW Clinical Goal(s):  ?Patient  verbalize understanding of plan  for management of Anxiety, Depression, and ADHD  through collaboration with Clinical Social Worker, provider, and care team.  ? ?Interventions: ?08/07/21- CSW spoke with pt who reports she was not able to be seen at [facility] due to out of network for her insurance. CSW suggested pt call the Customer Service # on her insurance card to seek options- CSW will also research options with colleague who is most familiar with Managed Medicaid. Similarly, pt reports she has not been able to find a Psychiatrist or an eye doctor that takes her insurance.  CSW completed a depression screening  with pt; pt scoring "21"  which is much higher than last month ("8"). Pt denies SI/HI. She is in the midst of moving to a new home (down the street) and feels she "hit a brick wall and don't know what to do".  Pt does not feel the RX is helping her depression and states it may be making it worse. Will alert PCP to this.  Pt admits to feeling "consumed" and also states "I have hope".   ? ?Depression screen [facility] 2/9 08/07/2021 07/03/2021 06/25/2021 05/14/2021 09/05/2020  ?Decreased Interest 0  ?Down, Depressed, Hopeless 3 0 0 3 0  ?PHQ - 2 Score 0  ?Altered sleeping ?Tired, decreased energy ?Change in appetite 3 0 0 3 1  ?Feeling bad or failure about yourself  1 0 0 3 0  ?Trouble concentrating ?Moving slowly or fidgety/restless 3 0 0 3 1  ?Suicidal thoughts 0 0 0 0 0  ?PHQ-9 Score ?Difficult doing work/chores Somewhat difficult Somewhat difficult Somewhat difficult Very difficult -  ?  ?07/24/21- CSW spoke with pt who reports having some consistent "dips" in her depression state; wants to ask PCP about increasing her dose. Validated her inquiry and plans to email PCP.  ?Pt denies SI/HI-  ?She is on the waiting list for an appointment with [facility] for counseling support- advised her to expect a call from them to schedule.  Pt inquiring about getting a vision evaluation by an Eye MD that takes Medicaid due to concern for glaucoma- stating her sister has recently been diagnosed with early onset glaucoma at age of 32yo.  Will pursue resources for vision care and advise pt of this asap.  ?1:1 collaboration with primary care provider regarding development and update of comprehensive plan of care as evidenced by provider attestation and co-signature ?Inter-disciplinary care team collaboration (see longitudinal plan of care) ?Evaluation of current treatment plan related to  self management and patient's adherence to plan as established by provider ?Review  resources, discussed options and provided patient information about  ?Advance Directive Education (discussed and encouraged pt to consider completion) ? ?Patient Self-Care Activities: ?-continue with medications as prescribed ?-call insurance provider # and inquire about in-network options for Psychiatry, Optometry and attention specialist ?-plan for appointment with [facility] (counseling ?--review material mailed to you for ADHD evaluation ?- keep 90 percent of counseling appointments  ?-call 911 or [phone] as discussed for emergencies/support ?  ?  ?

## 2021-08-08 NOTE — Telephone Encounter (Signed)
Spoke with pt advised of fola's message pt verbalized understanding ?

## 2021-08-09 ENCOUNTER — Ambulatory Visit: Payer: Self-pay

## 2021-08-09 ENCOUNTER — Other Ambulatory Visit: Payer: Medicaid Other | Admitting: Licensed Clinical Social Worker

## 2021-08-09 NOTE — Patient Instructions (Signed)
Visit Information ? ?Kristin Baker was given information about Medicaid Managed Care team care coordination services as a part of their [facility] Community Plan Medicaid benefit. Kristin Baker verbally consented to engagement with the [facility] Managed Care team.  ? ?If you are experiencing a medical emergency, please call 911 or report to your local emergency department or urgent care.  ? ?If you have a non-emergency medical problem during routine business hours, please contact your provider's office and ask to speak with a nurse.  ? ?For questions related to your [facility], please call: [phone] or visit the homepage here: [email or web address] ? ?If you would like to schedule transportation through your [facility], please call the following number at least 2 days in advance of your appointment: [phone]. ? Rides for urgent appointments can also be made after hours by calling Member Services. ? ?Call the Behavioral Health Crisis Line at [phone], at any time, 24 hours a day, 7 days a week. If you are in danger or need immediate medical attention call 911. ? ?If you would like help to quit smoking, call 1-800-QUIT-NOW (903-431-4512) OR Espa?ol: 1-855-D?jelo-Ya 469-158-2764) o para m?s informaci?n haga clic aqu? or Text READY to 200-400 to register via text ? ?Kristin Baker - following are the goals we discussed in your visit today:  ? Goals Addressed   ? ?  ?  ?  ?  ?  ? This Visit's Progress  ?   Begin and Stick with Counseling-Depression/Anxiety and ADHD (pt-stated)     ?   Timeframe:  Long-Range Goal ?Priority:  High ?Start Date:  07/03/21                           ?Expected End Date:             10/23/21         ? ?Follow Up Date 08/24/21 ?  ?-continue with medications as prescribed ?-call insurance provider # and inquire about in-network options for Psychiatry, Optometry and  attention specialist ?-plan for appointment with [facility] (counseling) -- Patient is eligible for their grant program in order to work with their intern but prefers a different referral.  ?--review material mailed to you for ADHD evaluation ?- keep 90 percent of counseling appointments  ?-call 911 or [phone] as discussed for emergencies/support ?  ?Why is this important?   ?Beating depression may take some time.  ?If you don't feel better right away, don't give up on your treatment plan.  ?  ?Notes:  ?  ? ?  ? ?Following is a copy of your plan of care:  ?Care Plan : LCSW Plan of Care  ?Updates made by Kristin Bryant, LCSW since 08/09/2021 12:00 AM  ?  ? ?Problem: Symptoms (Depression, Anxiety, ADHD)   ?Priority: High  ?  ? ?Long-Range Goal: Symptoms Monitored and Managed   ?Start Date: 07/03/2021  ?Expected End Date: 10/31/2021  ?Recent Progress: On track  ?Priority: High  ?Note:   ?Current Barriers:  ?Mental Health Concerns  and Lacks knowledge of community resource: For therapy and psychiatry   ? ?CSW Clinical Goal(s):  ?Patient  verbalize understanding of plan for management of Anxiety, Depression, and ADHD  through collaboration with Clinical Social Worker, provider, and care team.  ? ?Patient Self-Care Activities: ?-continue with medications as prescribed ?-call insurance provider # and inquire about in-network options for Psychiatry, Optometry and attention specialist ?-[facility] ?--review material emailed to you on 08/09/21 ?- keep 90 percent of counseling appointments  ?-call 911 or [phone] as discussed for emergencies/support ? ?Follow up:  Patient agrees to Care Plan and Follow-up. ?  ?Plan: The Managed Medicaid care management team will reach out to the patient again over the next 30 days. ?  ?Date/time of next scheduled Social Work care management/care coordination outreach:  08/24/21 at 1:00 pm ?  ?Kristin Baker, BSW, MSW, LCSW ?Managed Medicaid LCSW ?[place]  [facility] ?[email or web address] ?Phone: [phone] ? ?  ?  ?

## 2021-08-09 NOTE — Patient Outreach (Signed)
?Medicaid Managed Care ?Social Work Note ? ?08/09/2021 ?Name:  Kristin Baker MRN:  [id number] DOB:  1990-08-22 ? ?Kristin Baker is an 31 y.o. year old female who is a primary patient of [name], FNP.  The [facility] Managed Care Coordination team was consulted for assistance with:  Mental Health Counseling and Resources ? ?[name] was given information about Medicaid Managed Care Coordination team services today. [name] Patient agreed to services and verbal consent obtained. ? ?Engaged with patient  for by telephone forfollow up visit in response to referral for case management and/or care coordination services.  ? ?Assessments/Interventions:  Review of past medical history, allergies, medications, health status, including review of consultants reports, laboratory and other test data, was performed as part of comprehensive evaluation and provision of chronic care management services. ? ?SDOH: (Social Determinant of Health) assessments and interventions performed: ?SDOH Interventions   ? ?Flowsheet Row Most Recent Value  ?SDOH Interventions   ?Stress Interventions Offered [employer]  [Crisis resources provided. Referral made to [facility]]  ? ?  ? ? ?Advanced Directives Status:  Not addressed in this encounter. ? ?Care Plan ?                ?Allergies  ?Allergen Reactions  ? Flexeril [Cyclobenzaprine] Hives  ? Lactose Intolerance (Gi) Diarrhea  ? Honeysuckle Flower [Lonicera] Rash  ? Mixed Grasses Rash  ? ? ?Medications Reviewed Today   ? ? Reviewed by Ardean Larsen, CMA (Certified Medical Assistant) on 07/18/21 at [phone]  Med List Status: <None>  ? ?Medication Order Taking? Sig Documenting Provider Last Dose Status Informant  ?Galcanezumab-gnlm (EMGALITY) 120 MG/ML SOAJ [id number] Yes Inject 1 pen into the skin every 28 (twenty-eight) days. Sater, [name], MD Taking Active   ?hydrochlorothiazide (HYDRODIURIL) 12.5 MG tablet [id number] Yes Take 1 tablet (12.5 mg total)  by mouth daily. [name], FNP Taking Active   ?NP THYROID 90 MG tablet [id number] Yes Take 1 tablet (90 mg total) by mouth daily. [name], FNP Taking Active   ?rizatriptan (MAXALT-MLT) 10 MG disintegrating tablet [id number] Yes Take 1 tablet (10 mg total) by mouth as needed for migraine. May repeat in 2 hours if needed.  No more than 2/day ot 10/month Sater, [name], MD Taking Active   ?sertraline (ZOLOFT) 50 MG tablet [id number] Yes Take 1 tablet (50 mg total) by mouth daily. [name], FNP Taking Active   ?Vitamin D, Ergocalciferol, (DRISDOL) 1.25 MG (50000 UNIT) CAPS capsule [id number] Yes Take 1 capsule (50,000 Units total) by mouth every 7 (seven) days. [name], FNP Taking Active   ? ?  ?  ? ?  ? ? ?Patient Active Problem List  ? Diagnosis Date Noted  ? Seasonal and perennial allergic rhinitis 07/18/2021  ? Hyperlipidemia 06/25/2021  ? H/O multiple allergies 05/14/2021  ? Anxiety and depression 05/14/2021  ? Morbid obesity (HCC) 05/14/2021  ? Hypertension 05/14/2021  ? Allergies 05/14/2021  ? Chronic bilateral low back pain without sciatica 05/14/2021  ? Vitamin D deficiency 01/15/2021  ? Chronic migraine w/o aura, not intractable, w/o stat migr 01/15/2021  ? Chronic daily headache 01/15/2021  ? White matter abnormality on MRI of brain 01/15/2021  ? Acquired hypothyroidism 07/10/2020  ? Essential hypertension 07/10/2020  ? DDD (degenerative disc disease), lumbar 01/11/2020  ? ? ?Conditions to be addressed/monitored per PCP order:  Anxiety and Depression ? ?Care Plan : LCSW Plan of Care  ?Updates made by [name], LCSW  since 08/09/2021 12:00 AM  ?  ? ?Problem: Symptoms (Depression, Anxiety, ADHD)   ?Priority: High  ?  ? ?Long-Range Goal: Symptoms Monitored and Managed   ?Start Date: 07/03/2021  ?Expected End Date: 10/31/2021  ?Recent Progress: On track  ?Priority: High  ?Note:   ?Current Barriers:  ?Mental Health Concerns  and Lacks knowledge of community resource:  For therapy and psychiatry   ? ?CSW Clinical Goal(s):  ?Patient  verbalize understanding of plan for management of Anxiety, Depression, and ADHD  through collaboration with Clinical Social Worker, provider, and care team.  ? ?Timeframe:  Long-Range Goal ?Priority:  High ?Start Date:    07/03/21             ?Expected End Date:  10/31/21             ?  ?Follow Up Date 08/24/21 ?  ?- begin personal counseling ?- call and visit an old friend ?- check out volunteer opportunities ?- join a support group ?- laugh; watch a funny movie or comedian ?- learn and use visualization or guided imagery ?- perform a random act of kindness ?- practice relaxation or meditation daily ?- start or continue a personal journal ?- talk about feelings with a friend, family or spiritual advisor ?- practice positive thinking and self-talk  ?  ?Why is this important?   ?When you are stressed, down or upset, your body reacts too.  ?For example, your blood pressure may get higher; you may have a headache or stomachache.  ?When your emotions get the best of you, your body's ability to fight off cold and flu gets weak.  ?These steps will help you manage your emotions.   ?  ?Current Barriers:  ?Limited social support, Mental Health Concerns , Social Isolation, and Lacks knowledge of community resource: available mental health resources  ?Lacks knowledge of how to connect  ?Unable to locate a provider that will take insurance ?  ?CSW Clinical Goal(s):  ?Patient  will demonstrate a reduction in symptoms related to :Anxiety with Excessive Worry,, Depression: depressed mood, Anxiety and ADHD ?Explore community resource options for unmet needs related to her mental health ?patient will work with SW to address concerns related to finding a therapist and psychiatrist  ?patient will demonstrate improved health management independence as evidenced by increasing self-care and social support network through collaboration with [facility] representative, provider, and  care team.  ? ?Interventions: ?08/07/21- CSW spoke with pt who reports she was not able to be seen at [facility] due to out of network for her insurance. CSW suggested pt call the Customer Service # on her insurance card to seek options- CSW will also research options with colleague who is most familiar with Managed Medicaid. Similarly, pt reports she has not been able to find a Psychiatrist or an eye doctor that takes her insurance.  CSW completed a depression screening with pt; pt scoring "21"  which is much higher than last month ("8"). Pt denies SI/HI. She is in the midst of moving to a new home (down the street) and feels she "hit a brick wall and don't know what to do".  Pt does not feel the RX is helping her depression and states it may be making it worse. Will alert PCP to this.  Pt admits to feeling "consumed" and also states "I have hope".   ? ?08/09/21- Patient was referred to [facility] on 08/09/21 for both counseling and psychiatry. She is not suicidal but was encouraged  to consdier [facility] walk in clinic for support if needed. Patient's main support network consist of only her husband. Patient reports that her mother has untreated bipolar and that she is a trigger to her mental health. Email sent to patient with available mental health resources within her area on 08/09/21. Patient confirmed that she received this email. ? ?Depression screen [facility] 2/9 08/07/2021 07/03/2021 06/25/2021 05/14/2021 09/05/2020  ?Decreased Interest 3 1 1 3  0  ?Down, Depressed, Hopeless 3 0 0 3 0  ?PHQ - 2 Score 6 1 1 6  0  ?Altered sleeping 2 1 1 3 2   ?Tired, decreased energy 3 3 3 3 3   ?Change in appetite 3 0 0 3 1  ?Feeling bad or failure about yourself  1 0 0 3 0  ?Trouble concentrating 3 3 3 3 3   ?Moving slowly or fidgety/restless 3 0 0 3 1  ?Suicidal thoughts 0 0 0 0 0  ?PHQ-9 Score 21 8 8 24 10   ?Difficult doing work/chores Somewhat difficult Somewhat difficult Somewhat difficult Very difficult -  ?  ?07/24/21- CSW  spoke with pt who reports having some consistent "dips" in her depression state; wants to ask PCP about increasing her dose. Validated her inquiry and plans to email PCP.  ?Pt denies SI/HI-  ?She is on the w

## 2021-08-16 ENCOUNTER — Other Ambulatory Visit: Payer: Self-pay | Admitting: Nurse Practitioner

## 2021-08-16 DIAGNOSIS — I10 Essential (primary) hypertension: Secondary | ICD-10-CM

## 2021-08-21 ENCOUNTER — Other Ambulatory Visit: Payer: Self-pay | Admitting: Nurse Practitioner

## 2021-08-21 DIAGNOSIS — E559 Vitamin D deficiency, unspecified: Secondary | ICD-10-CM

## 2021-08-24 ENCOUNTER — Other Ambulatory Visit: Payer: Self-pay | Admitting: Licensed Clinical Social Worker

## 2021-08-24 NOTE — Patient Outreach (Signed)
?Medicaid Managed Care ?Social Work Note ? ?08/24/2021 ?Name:  Kristin Baker Osei MRN:  [id number] DOB:  Aug 05, 1990 ? ?Kristin Baker Shadden is an 31 y.o. year old female who is a primary patient of Donell Beersaseda, Folashade R, FNP.  [facility] was consulted for assistance with:  Mental Health Counseling and Resources ? ?Ms. Malhotra was given information about Medicaid Managed Care Coordination team services today. Kristin Baker Krahenbuhl Patient agreed to services and verbal consent obtained. ? ?Engaged with patient  for by telephone forfollow up visit in response to referral for case management and/or care coordination services.  ? ?Assessments/Interventions:  Review of past medical history, allergies, medications, health status, including review of consultants reports, laboratory and other test data, was performed as part of comprehensive evaluation and provision of chronic care management services. ? ?SDOH: (Social Determinant of Health) assessments and interventions performed: ?SDOH Interventions   ? ?Flowsheet Row Most Recent Value  ?SDOH Interventions   ?Stress Interventions Offered [employer], Provide Counseling  ? ?  ? ? ?Advanced Directives Status:  Not addressed in this encounter. ? ?Care Plan ?                ?Allergies  ?Allergen Reactions  ? Flexeril [Cyclobenzaprine] Hives  ? Lactose Intolerance (Gi) Diarrhea  ? Honeysuckle Flower [Lonicera] Rash  ? Mixed Grasses Rash  ? ? ?Medications Reviewed Today   ? ? Reviewed by Ardean LarsenMorehead, Michelle B, CMA (Certified Medical Assistant) on 07/18/21 at [phone]  Med List Status: <None>  ? ?Medication Order Taking? Sig Documenting Provider Last Dose Status Informant  ?Galcanezumab-gnlm (EMGALITY) 120 MG/ML SOAJ [id number] Yes Inject 1 pen into the skin every 28 (twenty-eight) days. Sater, Pearletha Furlichard A, MD Taking Active   ?hydrochlorothiazide (HYDRODIURIL) 12.5 MG tablet [id number] Yes Take 1 tablet (12.5 mg total) by mouth daily. Donell BeersPaseda, Folashade  R, FNP Taking Active   ?NP THYROID 90 MG tablet [id number] Yes Take 1 tablet (90 mg total) by mouth daily. Donell BeersPaseda, Folashade R, FNP Taking Active   ?rizatriptan (MAXALT-MLT) 10 MG disintegrating tablet [id number] Yes Take 1 tablet (10 mg total) by mouth as needed for migraine. May repeat in 2 hours if needed.  No more than 2/day ot 10/month Sater, Pearletha Furlichard A, MD Taking Active   ?sertraline (ZOLOFT) 50 MG tablet [id number] Yes Take 1 tablet (50 mg total) by mouth daily. Donell BeersPaseda, Folashade R, FNP Taking Active   ?Vitamin D, Ergocalciferol, (DRISDOL) 1.25 MG (50000 UNIT) CAPS capsule [id number] Yes Take 1 capsule (50,000 Units total) by mouth every 7 (seven) days. Donell BeersPaseda, Folashade R, FNP Taking Active   ? ?  ?  ? ?  ? ? ?Patient Active Problem List  ? Diagnosis Date Noted  ? Seasonal and perennial allergic rhinitis 07/18/2021  ? Hyperlipidemia 06/25/2021  ? H/O multiple allergies 05/14/2021  ? Anxiety and depression 05/14/2021  ? Morbid obesity (HCC) 05/14/2021  ? Hypertension 05/14/2021  ? Allergies 05/14/2021  ? Chronic bilateral low back pain without sciatica 05/14/2021  ? Vitamin D deficiency 01/15/2021  ? Chronic migraine w/o aura, not intractable, w/o stat migr 01/15/2021  ? Chronic daily headache 01/15/2021  ? White matter abnormality on MRI of brain 01/15/2021  ? Acquired hypothyroidism 07/10/2020  ? Essential hypertension 07/10/2020  ? DDD (degenerative disc disease), lumbar 01/11/2020  ? ? ?Conditions to be addressed/monitored per PCP order:  Anxiety and Depression ? ?Care Plan : LCSW Plan of Care  ?Updates made by Gustavus BryantJoyce, Nichelle Renwick L, LCSW since 08/24/2021 12:00 AM  ?  ? ?  Problem: Symptoms (Depression, Anxiety, ADHD)   ?Priority: High  ?  ? ?Long-Range Goal: Symptoms Monitored and Managed   ?Start Date: 07/03/2021  ?Expected End Date: 10/31/2021  ?Recent Progress: On track  ?Priority: High  ?Note:   ?Current Barriers:  ?Mental Health Concerns  and Lacks knowledge of community resource: For therapy and psychiatry   ? ?CSW  Clinical Goal(s):  ?Patient  verbalize understanding of plan for management of Anxiety, Depression, and ADHD  through collaboration with Clinical Social Worker, provider, and care team.  ? ?Timeframe:  Long-Range Goal ?Priority:  High ?Start Date:    07/03/21             ?Expected End Date:  10/31/21             ?  ?Follow Up Date 09/07/21 ?  ?- begin personal counseling ?- call and visit an old friend ?- check out volunteer opportunities ?- join a support group ?- laugh; watch a funny movie or comedian ?- learn and use visualization or guided imagery ?- perform a random act of kindness ?- practice relaxation or meditation daily ?- start or continue a personal journal ?- talk about feelings with a friend, family or spiritual advisor ?- practice positive thinking and self-talk  ?  ?Why is this important?   ?When you are stressed, down or upset, your body reacts too.  ?For example, your blood pressure may get higher; you may have a headache or stomachache.  ?When your emotions get the best of you, your body's ability to fight off cold and flu gets weak.  ?These steps will help you manage your emotions.   ?  ?Current Barriers:  ?Limited social support, Mental Health Concerns , Social Isolation, and Lacks knowledge of community resource: available mental health resources  ?Lacks knowledge of how to connect  ?Unable to locate a provider that will take insurance ?  ?CSW Clinical Goal(s):  ?Patient  will demonstrate a reduction in symptoms related to :Anxiety with Excessive Worry,, Depression: depressed mood, Anxiety and ADHD ?Explore community resource options for unmet needs related to her mental health ?patient will work with SW to address concerns related to finding a therapist and psychiatrist  ?patient will demonstrate improved health management independence as evidenced by increasing self-care and social support network through collaboration with Visual merchandiser, provider, and care team.   ? ?Interventions: ? ? ?08/24/21- Patient has not heard back from [facility] yet and was encouraged to contact facility today to inquire about her referrals. [facility] LCSW will send message to their team as well. Patient is interested in holistic options for her chronic pain and wishes to gain [facility] RNCM assistance for care coordination with acupuncture. Patient also wants assistance with finding an eye doctor. Patient was advised to contact her insurance. [facility] LCSW placed referral. Patient will be contacted on 08/27/21 by [facility] RNCM. Patient was sent another mental health provider list on 08/24/21 and successfully received the last resource list sent by [facility] LCSW. Patient wishes to increase her SSRI to 100 mg. [facility] LCSW will send message to PCP with this request. ? ?08/07/21- CSW spoke with pt who reports she was not able to be seen at [facility] due to out of network for her insurance. CSW suggested pt call the Customer Service # on her insurance card to seek options- CSW will also research options with colleague who is most familiar with Managed Medicaid. Similarly, pt reports she has not been able to find a Therapist, sports  or an eye doctor that takes her insurance.  CSW completed a depression screening with pt; pt scoring "21"  which is much higher than last month ("8"). Pt denies SI/HI. She is in the midst of moving to a new home (down the street) and feels she "hit a brick wall and don't know what to do".  Pt does not feel the RX is helping her depression and states it may be making it worse. Will alert PCP to this.  Pt admits to feeling "consumed" and also states "I have hope".   ? ?08/09/21- Patient was referred to [facility] on 08/09/21 for both counseling and psychiatry. She is not suicidal but was encouraged to consdier [facility] walk in clinic for support if needed. Patient's main support network consist of only her husband. Patient reports that her mother has untreated bipolar and that she is a trigger to her mental  health. Email sent to patient with available mental health resources within her area on 08/09/21. Patient confirmed that she received this email. ? ?Depression screen [facility] 2/9 08/07/2021 07/03/2021 06/25/2021 05/14/2021 09/05/2020  ?Tommi Rumps

## 2021-08-24 NOTE — Patient Instructions (Signed)
Visit Information ? ?Ms. Choe was given information about Medicaid Managed Care team care coordination services as a part of their [place] Medicaid benefit. [name] verbally consented to engagement with the [facility] Managed Care team.  ? ?If you are experiencing a medical emergency, please call 911 or report to your local emergency department or urgent care.  ? ?If you have a non-emergency medical problem during routine business hours, please contact your provider's office and ask to speak with a nurse.  ? ?For questions related to your [facility], please call: [phone] or visit the homepage here: [email or web address] ? ?If you would like to schedule transportation through your [facility], please call the following number at least 2 days in advance of your appointment: [phone]. ? Rides for urgent appointments can also be made after hours by calling Member Services. ? ?Call the [place] at [phone], at any time, 24 hours a day, 7 days a week. If you are in danger or need immediate medical attention call 911. ? ?If you would like help to quit smoking, call 1-800-QUIT-NOW 5011262056) OR Espa?ol: 1-855-D?jelo-Ya 203-573-7424) o para m?s informaci?n haga clic aqu? or Text READY to 200-400 to register via text ? ?Ms. Parrales - following are the goals we discussed in your visit today:  ? Goals Addressed   ? ?  ?  ?  ?  ?  ? This Visit's Progress  ?   Begin and Stick with Counseling-Depression/Anxiety and ADHD (pt-stated)     ?   Timeframe:  Long-Range Goal ?Priority:  High ?Start Date:  07/03/21                           ?Expected End Date:             10/23/21         ? ?  ?-continue with medications as prescribed ?-call insurance provider # and inquire about in-network options for Psychiatry, Optometry and attention specialist ?-plan for  appointment with [facility] (counseling) -- Patient is eligible for their grant program in order to work with their intern but prefers a different referral.  ?--review material mailed to you for ADHD evaluation ?- keep 35 percent of counseling appointments  ?-call 911 or [phone] as discussed for emergencies/support ?  ?Why is this important?   ?Beating depression may take some time.  ?If you don't feel better right away, don't give up on your treatment plan.  ?  ?Notes:  ?  ? ?  ? ? ?Following is a copy of your plan of care:  ?Care Plan : [place]  ?Updates made by Greg Cutter, LCSW since 08/24/2021 12:00 AM  ?  ? ?Problem: Symptoms (Depression, Anxiety, ADHD)   ?Priority: High  ?  ? ?Long-Range Goal: Symptoms Monitored and Managed   ?Start Date: 07/03/2021  ?Expected End Date: 10/31/2021  ?Recent Progress: On track  ?Priority: High  ?Note:   ?Current Barriers:  ?Mental Health Concerns  and Lacks knowledge of community resource: For therapy and psychiatry   ? ?CSW Clinical Goal(s):  ?Patient  verbalize understanding of plan for management of Anxiety, Depression, and ADHD  through collaboration with Clinical Social Worker, provider, and care team.  ? ?Timeframe:  Long-Range Goal ?Priority:  High ?Start Date:    07/03/21             ?Expected End  Date:  10/31/21             ?  ?Follow Up Date 09/07/21 ?  ?- begin personal counseling ?- call and visit an old friend ?- check out volunteer opportunities ?- join a support group ?- laugh; watch a funny movie or comedian ?- learn and use visualization or guided imagery ?- perform a random act of kindness ?- practice relaxation or meditation daily ?- start or continue a personal journal ?- talk about feelings with a friend, family or spiritual advisor ?- practice positive thinking and self-talk  ?  ?Why is this important?   ?When you are stressed, down or upset, your body reacts too.  ?For example, your blood pressure may get higher; you may have a headache or  stomachache.  ?When your emotions get the best of you, your body's ability to fight off cold and flu gets weak.  ?These steps will help you manage your emotions.   ?  ?Current Barriers:  ?Limited social support, Mental Health Concerns , Social Isolation, and Lacks knowledge of community resource: available mental health resources  ?Lacks knowledge of how to connect  ?Unable to locate a provider that will take insurance ?  ?CSW Clinical Goal(s):  ?Patient  will demonstrate a reduction in symptoms related to :Anxiety with Excessive Worry,, Depression: depressed mood, Anxiety and ADHD ?Explore community resource options for unmet needs related to her mental health ?patient will work with SW to address concerns related to finding a therapist and psychiatrist  ?patient will demonstrate improved health management independence as evidenced by increasing self-care and social support network through collaboration with [place] representative, provider, and care team.  ? ? ?Patient Self-Care Activities: ?-continue with medications as prescribed ?-call insurance provider # and inquire about in-network options for Psychiatry, Optometry and attention specialist ?-plan for appointment with [facility] ?--review material emailed to you on 08/09/21 ?- keep 90 percent of counseling appointments  ?-call 911 or [phone] as discussed for emergencies/support ? ?[name], BSW, MSW, LCSW ?Managed Medicaid LCSW ?[facility] ?[email or web address] ?Phone: [phone] ? ? ? ? ?  ?  ?

## 2021-08-27 ENCOUNTER — Other Ambulatory Visit: Payer: Medicaid Other

## 2021-08-27 ENCOUNTER — Ambulatory Visit: Payer: Self-pay

## 2021-08-27 ENCOUNTER — Other Ambulatory Visit: Payer: Self-pay | Admitting: Nurse Practitioner

## 2021-08-27 MED ORDER — VITAMIN D3 25 MCG (1000 UT) PO CAPS: 1000.0000 [IU] | ORAL_CAPSULE | Freq: Every day | ORAL | 3 refills | Status: DC

## 2021-08-27 NOTE — Patient Outreach (Signed)
[facility]) Care Management ? ?08/27/2021 ? ?[name] ?05/24/1991 ?[id number] ? ?[facility] LSCSW received update from [facility] on 08/27/21. They are unable to accept patient for psychiatry and counseling due to her not being a [facility] resident. [facility] LCSW completed care coordination and sent email to patient and [facility] RNCM on 08/27/21 that states:  ? ?"Ninfa Meeker,  ? ?Hope you are doing well. Just a reminder for your appointment with our nurse case manager on 08/31/21 at 9:00 am. I hope this works. I have CC'd her on this email. I wanted to let you know that [facility] denied your referral unfortunately. I would like for you to contact [facility] in [place] at [phone] or [phone]. She wants the patients to contact her directly to make a referral. She has availability and can get you right in for counseling. I wanted your permission to place a referral for psychiatry at [facility] if that is okay with you. I will place that referral once I hear back from you!" ? ?[facility] LCSW will await for response before placing referral.  ? ?Dickie La, BSW, MSW, LCSW ?Managed Medicaid LCSW ?[place]  [facility] ?[email or web address] ?Phone: [phone] ? ? ? ? ?

## 2021-08-27 NOTE — Progress Notes (Signed)
Hi Alona BeneJoyce!  ? ?I hate to give you bad news on this pretty Monday morning, but this patient cannot be seen at our office. We can only [place], who are indigent or [place].  ? ?If it helps, some external resources are [facility], [facility], [facility], [facility], [facility]. I hope this helps! ? ?

## 2021-08-27 NOTE — Patient Instructions (Signed)
Visit Information ? ?Kristin Baker was given information about Medicaid Managed Care team care coordination services on 08/24/21 and verbally consented to engagement with the [facility] Managed Care team.  ? ?Care coordination completed by [facility] on 08/27/21. Please see education materials related to mental health resources provided by e-mail link. ? ?Licensed Clinical Social Worker will wait for patient's response before placing referral for psychiatry.  ? ?Gustavus BryantBrooke L Naya Ilagan, LCSW ? ?Following is a copy of your plan of care:  ?Patient Care Plan: LCSW Plan of Care  ?  ? ?Problem Identified: Symptoms (Depression, Anxiety, ADHD)   ?Priority: High  ?  ? ?Long-Range Goal: Symptoms Monitored and Managed   ?Start Date: 07/03/2021  ?Expected End Date: 10/31/2021  ?Recent Progress: On track  ?Priority: High  ?Note:   ?Current Barriers:  ?Mental Health Concerns  and Lacks knowledge of community resource: For therapy and psychiatry   ? ?CSW Clinical Goal(s):  ?Patient  verbalize understanding of plan for management of Anxiety, Depression, and ADHD  through collaboration with Clinical Social Worker, provider, and care team.  ? ?Timeframe:  Long-Range Goal ?Priority:  High ?Start Date:    07/03/21             ?Expected End Date:  10/31/21             ?  ?Follow Up Date 09/07/21 ?  ?- begin personal counseling ?- call and visit an old friend ?- check out volunteer opportunities ?- join a support group ?- laugh; watch a funny movie or comedian ?- learn and use visualization or guided imagery ?- perform a random act of kindness ?- practice relaxation or meditation daily ?- start or continue a personal journal ?- talk about feelings with a friend, family or spiritual advisor ?- practice positive thinking and self-talk  ?  ?Why is this important?   ?When you are stressed, down or upset, your body reacts too.  ?For example, your blood pressure may get higher; you may have a headache or stomachache.  ?When your emotions get the best of you, your  body's ability to fight off cold and flu gets weak.  ?These steps will help you manage your emotions.   ?  ?Current Barriers:  ?Limited social support, Mental Health Concerns , Social Isolation, and Lacks knowledge of community resource: available mental health resources  ?Lacks knowledge of how to connect  ?Unable to locate a provider that will take insurance ?  ?CSW Clinical Goal(s):  ?Patient  will demonstrate a reduction in symptoms related to :Anxiety with Excessive Worry,, Depression: depressed mood, Anxiety and ADHD ?Explore community resource options for unmet needs related to her mental health ?patient will work with SW to address concerns related to finding a therapist and psychiatrist  ?patient will demonstrate improved health management independence as evidenced by increasing self-care and social support network through collaboration with Visual merchandiserClinical Social Worker, provider, and care team.  ? ?Patient Self-Care Activities: ?-continue with medications as prescribed ?-call insurance provider # and inquire about in-network options for Psychiatry, Optometry and attention specialist ?-[facility] ?--review material emailed to you on 08/09/21 ?- keep 90 percent of counseling appointments  ?-call 911 or [phone] as discussed for emergencies/support ? ?Kristin Baker, BSW, MSW, LCSW ?Managed Medicaid LCSW ?[place]  [facility] ?[email or web address] ?Phone: [phone] ? ? ? ? ?  ?  ?

## 2021-08-27 NOTE — Progress Notes (Signed)
I hope all is well. I'm sorry, but we cannot see this patient :( ? ? ?

## 2021-08-27 NOTE — Telephone Encounter (Signed)
Spoke to pt, states it was a mistake she did on her [facility] app, understands she should be on 1000 units.  ?

## 2021-08-28 ENCOUNTER — Other Ambulatory Visit: Payer: Self-pay | Admitting: Nurse Practitioner

## 2021-08-28 DIAGNOSIS — E039 Hypothyroidism, unspecified: Secondary | ICD-10-CM

## 2021-08-28 LAB — CMP14+EGFR
ALT: 12 IU/L (ref 0–32)
AST: 10 IU/L (ref 0–40)
Albumin/Globulin Ratio: 1.7 (ref 1.2–2.2)
Albumin: 4.3 g/dL (ref 3.8–4.8)
Alkaline Phosphatase: 98 IU/L (ref 44–121)
BUN/Creatinine Ratio: 3 — ABNORMAL LOW (ref 9–23)
BUN: 3 mg/dL — ABNORMAL LOW (ref 6–20)
Bilirubin Total: 0.3 mg/dL (ref 0.0–1.2)
CO2: 23 mmol/L (ref 20–29)
Calcium: 8.9 mg/dL (ref 8.7–10.2)
Chloride: 103 mmol/L (ref 96–106)
Creatinine, Ser: 0.87 mg/dL (ref 0.57–1.00)
Globulin, Total: 2.6 g/dL (ref 1.5–4.5)
Glucose: 92 mg/dL (ref 70–99)
Potassium: 3.6 mmol/L (ref 3.5–5.2)
Sodium: 139 mmol/L (ref 134–144)
Total Protein: 6.9 g/dL (ref 6.0–8.5)
eGFR: 91 mL/min/{1.73_m2} (ref >59)

## 2021-08-28 LAB — LIPID PANEL
Chol/HDL Ratio: 5.7 ratio — ABNORMAL HIGH (ref 0.0–4.4)
Cholesterol, Total: 199 mg/dL (ref 100–199)
HDL: 35 mg/dL — ABNORMAL LOW (ref >39)
LDL Chol Calc (NIH): 141 mg/dL — ABNORMAL HIGH (ref 0–99)
Triglycerides: 126 mg/dL (ref 0–149)
VLDL Cholesterol Cal: 23 mg/dL (ref 5–40)

## 2021-08-28 LAB — VITAMIN D 25 HYDROXY (VIT D DEFICIENCY, FRACTURES): Vit D, 25-Hydroxy: 21.3 ng/mL — ABNORMAL LOW (ref 30.0–100.0)

## 2021-08-28 LAB — TSH: TSH: 14.8 u[IU]/mL — ABNORMAL HIGH (ref 0.450–4.500)

## 2021-08-28 MED ORDER — LEVOTHYROXINE SODIUM 112 MCG PO TABS: 112.0000 ug | ORAL_TABLET | Freq: Every day | ORAL | 3 refills | Status: DC

## 2021-08-28 NOTE — Telephone Encounter (Signed)
Please advise 

## 2021-08-29 NOTE — Telephone Encounter (Signed)
Pt aware.

## 2021-08-30 ENCOUNTER — Ambulatory Visit (INDEPENDENT_AMBULATORY_CARE_PROVIDER_SITE_OTHER): Payer: Medicaid Other | Admitting: Nurse Practitioner

## 2021-08-30 ENCOUNTER — Encounter: Payer: Self-pay | Admitting: Nurse Practitioner

## 2021-08-30 VITALS — BP 139/89 | HR 78 | Ht 67.0 in | Wt 301.0 lb

## 2021-08-30 DIAGNOSIS — E039 Hypothyroidism, unspecified: Secondary | ICD-10-CM | POA: Diagnosis not present

## 2021-08-30 DIAGNOSIS — M25512 Pain in left shoulder: Secondary | ICD-10-CM

## 2021-08-30 DIAGNOSIS — G8929 Other chronic pain: Secondary | ICD-10-CM

## 2021-08-30 DIAGNOSIS — I10 Essential (primary) hypertension: Secondary | ICD-10-CM

## 2021-08-30 DIAGNOSIS — F32A Depression, unspecified: Secondary | ICD-10-CM

## 2021-08-30 DIAGNOSIS — E559 Vitamin D deficiency, unspecified: Secondary | ICD-10-CM

## 2021-08-30 DIAGNOSIS — F419 Anxiety disorder, unspecified: Secondary | ICD-10-CM

## 2021-08-30 MED ORDER — SERTRALINE HCL 100 MG PO TABS: 100.0000 mg | ORAL_TABLET | Freq: Every day | ORAL | 3 refills | Status: DC

## 2021-08-30 NOTE — Patient Instructions (Signed)

## 2021-08-30 NOTE — Assessment & Plan Note (Addendum)
PHQ9 score 21, ?Chronic condition not well controlled on Zoloft 75 mg daily,  ?Start Zoloft 100 mg daily. uncontrolled hypothyroidism may be contributing to this, started on levothyroxine tablets daily ?Awaiting referral to psych.  ?Denies SI, HI ?

## 2021-08-30 NOTE — Assessment & Plan Note (Addendum)
BP Readings from Last 3 Encounters:  ?08/30/21 139/89  ?07/18/21 128/74  ?06/25/21 (!) 142/85  ?On hydrochlorothiazide 12.5 mg daily ?Continue current medication ?DASH diet advised engage in regular physical exercise at least 150 minutes ?

## 2021-08-30 NOTE — Assessment & Plan Note (Signed)
Lab Results  ?Component Value Date  ? TSH 14.800 (H) 08/27/2021  ? ?NP thyroid discontinue ?Patient has started taking levothyroxine 112 mcg tablets daily since yesterday ?Follow-up in 4 weeks to recheck thyroid function.  ?

## 2021-08-30 NOTE — Progress Notes (Signed)
? ?  Kristin Baker     MRN: [id number]      DOB: 1990/12/28 ? ? ?HPI ?Ms. Blumenstock with past medical history of essential hypertension, acquired hypothyroidism, vitamin D deficiency, anxiety and depression, chronic bilateral low back pain without sciatica, hyperlipidemia is here for follow up for hypothyroidism and depression . ?the PT denies any adverse reactions to current medications since the last visit.  ? ? ? ?Pt c/o left shoulder pain that got worse 6 weeks ago ,with movement she has burning pain radiating down her to her arm. At rest she does not experience this symptom. She injured her left shoulder last year when she fell, she got steroid shot few months ago. Takes up to  of ibuprofen daily.  ? ?Pt states that her depression has not improved . She has not been able to see a psychiatrist yet,  she took zoloft  daily for a month and went up to  yesterday, denies SI, HI PHQ 9 score 21 ? ? ? ? ? ?ROS ?Denies recent fever or chills. ?Denies sinus pressure, nasal congestion, ear pain or sore throat. ?Denies chest congestion, productive cough or wheezing. ?Denies chest pains, palpitations and leg swelling ?Denies abdominal pain, nausea, vomiting,diarrhea or constipation.   ?Denies dysuria, frequency, hesitancy or incontinence. ?Has  left shoulder pain no swelling and limitation in mobility. ?Denies headaches, seizures, numbness, or tingling. ? ? ? ? ?PE ? ?BP 139/89 (BP Location: Right Arm, Patient Position: Sitting, Cuff Size: Large)   Pulse 78   Ht  (1.702 m)   Wt (!) 301 lb (136.5 kg)   LMP 12/25/2020 (Approximate)   SpO2 99%   BMI 47.14 kg/m?  ? ?Patient alert and oriented and in no cardiopulmonary distress. ? ?Chest: Clear to auscultation bilaterally. ? ?CVS: S1, S2 no murmurs, no S3.Regular rate. ? ?ABD: Soft non tender.  ? ?MS: Adequate ROM spine, shoulders, hips and knees. ? ?Psych: Good eye contact, normal affect. Memory intact not anxious or depressed appearing. ? ?CNS: CN 2-12  intact, power,  normal throughout.no focal deficits noted. ? ? ?Assessment & Plan ? ?Hypertension ?BP Readings from Last 3 Encounters:  ?08/30/21 139/89  ?07/18/21 128/74  ?06/25/21 (!) 142/85  ?On hydrochlorothiazide 12.5 mg daily ?Continue current medication ?DASH diet advised engage in regular physical exercise at least 150 minutes ? ?Acquired hypothyroidism ?Lab Results  ?Component Value Date  ? TSH 14.800 (H) 08/27/2021  ? ?NP thyroid discontinue ?Patient has started taking levothyroxine 112 mcg tablets daily since yesterday ?Follow-up in 4 weeks to recheck thyroid function.  ? ?Anxiety and depression ?PHQ9 score 21, ?Chronic condition not well controlled on Zoloft 75 mg daily,  ?Start Zoloft 100 mg daily. uncontrolled hypothyroidism may be contributing to this, started on levothyroxine tablets daily ?Awaiting referral to psych.  ?Denies SI, HI ? ?Left shoulder pain ?Takes OTC ibuprofen ?Patient encouraged to take medication only as needed to avoid GI bleed and minimize side effects of use of NSAID ?Engage in stretching exercise apply heat   ?

## 2021-08-30 NOTE — Assessment & Plan Note (Signed)
Takes OTC ibuprofen ?Patient encouraged to take medication only as needed to avoid GI bleed and minimize side effects of use of NSAID ?Engage in stretching exercise apply heat  ?

## 2021-08-31 ENCOUNTER — Ambulatory Visit: Payer: Self-pay

## 2021-08-31 ENCOUNTER — Other Ambulatory Visit: Payer: Self-pay | Admitting: *Deleted

## 2021-08-31 NOTE — Patient Outreach (Signed)
?Medicaid Managed Care   ?Nurse Care Manager Note ? ?08/31/2021 ?Name:  Kristin Baker MRN:  [id number] DOB:  [birth year] ? ?Kristin Baker is an 31 y.o. year old female who is a primary patient of [name], FNP.  The [facility] Managed Care Coordination team was consulted for assistance with:    ?Chronic pain ? ?[name] was given information about Medicaid Managed Care Coordination team services today. Kristin Baker Patient agreed to services and verbal consent obtained. ? ?Engaged with patient by telephone for initial visit in response to provider referral for case management and/or care coordination services.  ? ?Assessments/Interventions:  Review of past medical history, allergies, medications, health status, including review of consultants reports, laboratory and other test data, was performed as part of comprehensive evaluation and provision of chronic care management services. ? ?SDOH (Social Determinants of Health) assessments and interventions performed: ?SDOH Interventions   ? ?Flowsheet Row Most Recent Value  ?SDOH Interventions   ?Food Insecurity Interventions Intervention Not Indicated  ?Housing Interventions Intervention Not Indicated  ? ?  ? ? ?Care Plan ? ?Allergies  ?Allergen Reactions  ? Flexeril [Cyclobenzaprine] Hives  ? Lactose Intolerance (Gi) Diarrhea  ? Honeysuckle Flower [Lonicera] Rash  ? Mixed Grasses Rash  ? ? ?Medications Reviewed Today   ? ? Reviewed by [name], RN (Registered Nurse) on 08/31/21 at [phone]  Med List Status: <None>  ? ?Medication Order Taking? Sig Documenting Provider Last Dose Status Informant  ?albuterol (VENTOLIN HFA) 108 (90 Base) MCG/ACT inhaler [id number] Yes Inhale 2 puffs into the lungs every 4 (four) hours as needed for wheezing or shortness of breath (cough, shortness of breath or wheezing.). [name], MD Taking Active   ?azelastine (ASTELIN) 0.1 % nasal spray [id number] Yes 2 sprays per nostril 1-2 times daily as needed.  [name], MD Taking Active   ?Cholecalciferol (VITAMIN D3) 25 MCG (1000 UT) CAPS [id number] Yes Take 1 capsule (1,000 Units total) by mouth daily. [name], FNP Taking Active   ?fluticasone (FLOVENT HFA) 110 MCG/ACT inhaler [id number] Yes Inhale 2 puffs into the lungs 2 (two) times daily. With spacer. [name], MD Taking Active   ?Galcanezumab-gnlm (EMGALITY) 120 MG/ML SOAJ [id number] Yes Inject 1 pen into the skin every 28 (twenty-eight) days. Sater, [name], MD Taking Active   ?hydrochlorothiazide (HYDRODIURIL) 12.5 MG tablet [id number] Yes TAKE 1 TABLET(12.5 MG) BY MOUTH DAILY [name], Baird Kay, FNP Taking Active   ?levocetirizine (XYZAL) 5 MG tablet [id number] Yes Take 1 tablet (5 mg total) by mouth in the morning and at bedtime. [name], MD Taking Active   ?levothyroxine (SYNTHROID) 112 MCG tablet [id number] Yes Take 1 tablet (112 mcg total) by mouth daily. [name], FNP Taking Active   ?rizatriptan (MAXALT-MLT) 10 MG disintegrating tablet [id number] Yes Take 1 tablet (10 mg total) by mouth as needed for migraine. May repeat in 2 hours if needed.  No more than 2/day ot 10/month Sater, [name], MD Taking Active   ?sertraline (ZOLOFT) 100 MG tablet [id number] Yes Take 1 tablet (100 mg total) by mouth daily. [name], FNP Taking Active   ?triamcinolone (NASACORT) 55 MCG/ACT AERO nasal inhaler [id number] Yes Place 1 spray into the nose daily. [name], MD Taking Active   ? ?  ?  ? ?  ? ? ?Patient Active Problem List  ? Diagnosis Date Noted  ? Left shoulder pain 08/30/2021  ? Seasonal and perennial allergic rhinitis 07/18/2021  ?  Hyperlipidemia 06/25/2021  ? H/O multiple allergies 05/14/2021  ? Anxiety and depression 05/14/2021  ? Morbid obesity (HCC) 05/14/2021  ? Hypertension 05/14/2021  ? Allergies 05/14/2021  ? Chronic bilateral low back pain without sciatica 05/14/2021  ? Vitamin D deficiency 01/15/2021  ? Chronic migraine w/o  aura, not intractable, w/o stat migr 01/15/2021  ? Chronic daily headache 01/15/2021  ? White matter abnormality on MRI of brain 01/15/2021  ? Acquired hypothyroidism 07/10/2020  ? Essential hypertension 07/10/2020  ? DDD (degenerative disc disease), lumbar 01/11/2020  ? ? ?Conditions to be addressed/monitored per PCP order:   chronic pain ? ?Care Plan : RN Care Manager Plan of Care  ?Updates made by Heidi Dachobb, Cyleigh Massaro A, RN since 08/31/2021 12:00 AM  ?  ? ?Problem: Health Management needs related to Chronic Pain   ?  ? ?Long-Range Goal: Development of Plan of Care to address Health Management needs related to Chronic Pain   ?Start Date: 08/31/2021  ?Expected End Date: 11/29/2021  ?Priority: High  ?Note:   ?Current Barriers:  ?Chronic Disease Management support and education needs related to Chronic Pain [name] would like to manage her back pain with a holistic approach. She is interested in acupuncture. She has a back injection in August 2022 and has not had follow up. She reports a back pain flare up for the last 2 months. ? ?RNCM Clinical Goal(s):  ?Patient will verbalize understanding of plan for management of chronic back pain as evidenced by patient verbalization of self monitoring activities ?continue to work with RN Care Manager and/or Social Worker to address care management and care coordination needs related to chronic back pain as evidenced by adherence to CM Team Scheduled appointments     through collaboration with Medical illustratorN Care manager, provider, and care team.  ? ?Interventions: ?Inter-disciplinary care team collaboration (see longitudinal plan of care) ?Evaluation of current treatment plan related to  self management and patient's adherence to plan as established by provider ?Advised [facility] to contact [facility] services for wellness benefits(acupuncture) ?Provided patient with Medicaid Ombudsman [phone], call if unable to find acupuncture services accepting Medicaid ? ? ?Pain:  (Status: New goal.) Long  Term Goal  ?Pain assessment performed ?Medications reviewed ?Reviewed provider established plan for pain management; ?Discussed importance of adherence to all scheduled medical appointments; ?Counseled on the importance of reporting any/all new or changed pain symptoms or management strategies to pain management provider; ?Advised patient to report to care team affect of pain on daily activities; ?Discussed use of relaxation techniques and/or diversional activities to assist with pain reduction (distraction, imagery, relaxation, massage, acupressure, TENS, heat, and cold application; ?Reviewed with patient prescribed pharmacological and nonpharmacological pain relief strategies; ?Assessed social determinant of health barriers;  ?Advised patient to eat foods that help decrease pain(blueberries, salmon, ginger, pumpkin seeds, turmeric), avoid sugar ?Discussed the benefits of exercise, encourages physical activity daily ?[facility] for [facility] of care ? ?Patient Goals/Self-Care Activities: ?Call provider office for new concerns or questions  ?Exercise daily, start with 10 minutes a day ?Eat foods to that help improve pain, avoid sugar ? ? ?  ? ? ?Follow Up:  Patient agrees to Care Plan and Follow-up. ? ?Plan: The Managed Medicaid care management team will reach out to the patient again over the next 30 days. ? ?Date/time of next scheduled RN care management/care coordination outreach:  10/02/21 @ 9am ? ?Estanislado EmmsMelanie Abygale Karpf RN, BSN ?[facility] ?RN Care Coordinator ? ?

## 2021-08-31 NOTE — Patient Instructions (Signed)
Visit Information ? ?Ms. Sween was given information about Medicaid Managed Care team care coordination services as a part of their [facility] Community Plan Medicaid benefit. [name] verbally consented to engagement with the [facility] Managed Care team.  ? ?If you are experiencing a medical emergency, please call 911 or report to your local emergency department or urgent care.  ? ?If you have a non-emergency medical problem during routine business hours, please contact your provider's office and ask to speak with a nurse.  ? ?For questions related to your [facility], please call: [phone] or visit the homepage here: [email or web address] ? ?If you would like to schedule transportation through your [facility], please call the following number at least 2 days in advance of your appointment: [phone]. ? Rides for urgent appointments can also be made after hours by calling Member Services. ? ?Call the Behavioral Health Crisis Line at [phone], at any time, 24 hours a day, 7 days a week. If you are in danger or need immediate medical attention call 911. ? ?If you would like help to quit smoking, call 1-800-QUIT-NOW (406-691-4294) OR Espa?ol: 1-855-D?jelo-Ya 479-566-7488) o para m?s informaci?n haga clic aqu? or Text READY to 200-400 to register via text ? ?Ms. Horine, ? ? ?Please see education materials related to managing pain provided by MyChart link. ? ?Patient verbalizes understanding of instructions and care plan provided today and agrees to view in MyChart. Active MyChart status confirmed with patient.   ? ?Telephone follow up appointment with Managed Medicaid care management team member scheduled for:10/02/21 @ 9am ? ?Estanislado Emms RN, BSN ?[facility] ?RN Care Coordinator ? ? ?Following is a copy of your plan of care:  ?Care Plan : RN Care Manager  Plan of Care  ?Updates made by [name], RN since 08/31/2021 12:00 AM  ?  ? ?Problem: Health Management needs related to Chronic Pain   ?  ? ?Long-Range Goal: Development of Plan of Care to address Health Management needs related to Chronic Pain   ?Start Date: 08/31/2021  ?Expected End Date: 11/29/2021  ?Priority: High  ?Note:   ?Current Barriers:  ?Chronic Disease Management support and education needs related to Chronic Pain Ms. Winegarden would like to manage her back pain with a holistic approach. She is interested in acupuncture. She has a back injection in August 2022 and has not had follow up. She reports a back pain flare up for the last 2 months. ? ?RNCM Clinical Goal(s):  ?Patient will verbalize understanding of plan for management of chronic back pain as evidenced by patient verbalization of self monitoring activities ?continue to work with RN Care Manager and/or Social Worker to address care management and care coordination needs related to chronic back pain as evidenced by adherence to CM Team Scheduled appointments     through collaboration with Medical illustrator, provider, and care team.  ? ?Interventions: ?Inter-disciplinary care team collaboration (see longitudinal plan of care) ?Evaluation of current treatment plan related to  self management and patient's adherence to plan as established by provider ?Advised patient to contact [facility] member services for wellness benefits(acupuncture) ?Provided patient with Medicaid Ombudsman [phone], call if unable to find acupuncture services accepting Medicaid ? ? ?Pain:  (Status: New goal.) Long Term Goal  ?Pain assessment performed ?Medications reviewed ?Reviewed provider established plan for pain management; ?Discussed importance of adherence to all scheduled medical appointments; ?Counseled on the importance of reporting any/all new or  changed pain symptoms or management strategies to pain management provider; ?Advised patient to report to care team affect of  pain on daily activities; ?Discussed use of relaxation techniques and/or diversional activities to assist with pain reduction (distraction, imagery, relaxation, massage, acupressure, TENS, heat, and cold application; ?Reviewed with patient prescribed pharmacological and nonpharmacological pain relief strategies; ?Assessed social determinant of health barriers;  ?Advised patient to eat foods that help decrease pain(blueberries, salmon, ginger, pumpkin seeds, turmeric), avoid sugar ?Discussed the benefits of exercise, encourages physical activity daily ?Schedule a follow up with [facility] for plan of care ? ?Patient Goals/Self-Care Activities: ?Call provider office for new concerns or questions  ?Exercise daily, start with 10 minutes a day ?Eat foods to that help improve pain, avoid sugar ? ? ?  ?  ?

## 2021-09-07 ENCOUNTER — Other Ambulatory Visit: Payer: Self-pay | Admitting: Licensed Clinical Social Worker

## 2021-09-07 NOTE — Patient Outreach (Addendum)
Medicaid Managed Care Social Work Note  09/07/2021 Name:  Kristin Baker MRN:  [id number] DOB:  [birth year]  Kristin Baker is an 31 y.o. year old female who is a primary patient of [name], FNP.  The Medicaid Managed Care Coordination team was consulted for assistance with:  Mental Health Counseling and Resources  Ms. Rahal was given information about Medicaid Managed Care Coordination team services today. [name] Patient agreed to services and verbal consent obtained.  Engaged with patient  for by telephone forfollow up visit in response to referral for case management and/or care coordination services.   Assessments/Interventions:  Review of past medical history, allergies, medications, health status, including review of consultants reports, laboratory and other test data, was performed as part of comprehensive evaluation and provision of chronic care management services.  SDOH: (Social Determinant of Health) assessments and interventions performed: SDOH Interventions    Flowsheet Row Most Recent Value  SDOH Interventions   Stress Interventions Offered [employer], Provide Counseling       Advanced Directives Status:  See Care Plan for related entries.  Care Plan                 Allergies  Allergen Reactions   Flexeril [Cyclobenzaprine] Hives   Lactose Intolerance (Gi) Diarrhea   Honeysuckle Flower [Lonicera] Rash   Mixed Grasses Rash    Medications Reviewed Today     Reviewed by [name], RN (Registered Nurse) on 08/31/21 at [phone]  Med List Status: <None>   Medication Order Taking? Sig Documenting Provider Last Dose Status Informant  albuterol (VENTOLIN HFA) 108 (90 Base) MCG/ACT inhaler [id number] Yes Inhale 2 puffs into the lungs every 4 (four) hours as needed for wheezing or shortness of breath (cough, shortness of breath or wheezing.). [name], MD Taking Active   azelastine (ASTELIN) 0.1 % nasal spray  [id number] Yes 2 sprays per nostril 1-2 times daily as needed. [name], MD Taking Active   Cholecalciferol (VITAMIN D3) 25 MCG (1000 UT) CAPS [id number] Yes Take 1 capsule (1,000 Units total) by mouth daily. [name], FNP Taking Active   fluticasone (FLOVENT HFA) 110 MCG/ACT inhaler [id number] Yes Inhale 2 puffs into the lungs 2 (two) times daily. With spacer. [name], MD Taking Active   Galcanezumab-gnlm [facility]) 120 MG/ML Ivory Broad [id number] Yes Inject 1 pen into the skin every 28 (twenty-eight) days. Sater, [name], MD Taking Active   hydrochlorothiazide (HYDRODIURIL) 12.5 MG tablet [id number] Yes TAKE 1 TABLET(12.5 MG) BY MOUTH DAILY [name], Baird Kay, FNP Taking Active   levocetirizine (XYZAL) 5 MG tablet [id number] Yes Take 1 tablet (5 mg total) by mouth in the morning and at bedtime. [name], MD Taking Active   levothyroxine (SYNTHROID) 112 MCG tablet [id number] Yes Take 1 tablet (112 mcg total) by mouth daily. [name], FNP Taking Active   rizatriptan (MAXALT-MLT) 10 MG disintegrating tablet [id number] Yes Take 1 tablet (10 mg total) by mouth as needed for migraine. May repeat in 2 hours if needed.  No more than 2/day ot 10/month Sater, [name], MD Taking Active   sertraline (ZOLOFT) 100 MG tablet [id number] Yes Take 1 tablet (100 mg total) by mouth daily. [name], FNP Taking Active   triamcinolone (NASACORT) 55 MCG/ACT AERO nasal inhaler [id number] Yes Place 1 spray into the nose daily. [name], MD Taking Active             Patient Active Problem List  Diagnosis Date Noted   Left shoulder pain 08/30/2021   Seasonal and perennial allergic rhinitis 07/18/2021   Hyperlipidemia 06/25/2021   H/O multiple allergies 05/14/2021   Anxiety and depression 05/14/2021   Morbid obesity (HCC) 05/14/2021   Hypertension 05/14/2021   Allergies 05/14/2021   Chronic bilateral low back pain without sciatica 05/14/2021    Vitamin D deficiency 01/15/2021   Chronic migraine w/o aura, not intractable, w/o stat migr 01/15/2021   Chronic daily headache 01/15/2021   White matter abnormality on MRI of brain 01/15/2021   Acquired hypothyroidism 07/10/2020   Essential hypertension 07/10/2020   DDD (degenerative disc disease), lumbar 01/11/2020    Conditions to be addressed/monitored per PCP order:  Anxiety, Depression, and Bipolar Disorder  Care Plan : LCSW Plan of Care  Updates made by [name], LCSW since 09/07/2021 12:00 AM     Problem: Symptoms (Depression, Anxiety, ADHD)   Priority: High     Long-Range Goal: Symptoms Monitored and Managed   Start Date: 07/03/2021  Expected End Date: 10/31/2021  Recent Progress: On track  Priority: High  Note:   Current Barriers:  Mental Health Concerns  and Lacks knowledge of community resource: For therapy and psychiatry    CSW Clinical Goal(s):  Patient  verbalize understanding of plan for management of Anxiety, Depression, and ADHD  through collaboration with Clinical Social Worker, provider, and care team.   Timeframe:  Long-Range Goal Priority:  High Start Date:    07/03/21             Expected End Date:  11/07/21             Follow Up Date - 11/07/21   - begin personal counseling - call and visit an old friend - check out volunteer opportunities - join a support group - laugh; watch a funny movie or comedian - learn and use visualization or guided imagery - perform a random act of kindness - practice relaxation or meditation daily - start or continue a personal journal - talk about feelings with a friend, family or spiritual advisor - practice positive thinking and self-talk    Why is this important?   When you are stressed, down or upset, your body reacts too.  For example, your blood pressure may get higher; you may have a headache or stomachache.  When your emotions get the best of you, your body's ability to fight off cold and flu gets weak.   These steps will help you manage your emotions.     Current Barriers:  Limited social support, Mental Health Concerns , Social Isolation, and Lacks knowledge of community resource: available mental health resources  Lacks knowledge of how to connect  Unable to locate a provider that will take insurance   CSW Clinical Goal(s):  Patient  will demonstrate a reduction in symptoms related to :Anxiety with Excessive Worry,, Depression: depressed mood, Anxiety and ADHD Explore community resource options for unmet needs related to her mental health patient will work with SW to address concerns related to finding a therapist and psychiatrist  patient will demonstrate improved health management independence as evidenced by increasing self-care and social support network through collaboration with Clinical Social Worker, provider, and care team.   Interventions:   08/24/21- Patient has not heard back from [facility] yet and was encouraged to contact facility today to inquire about her referrals. [facility] LCSW will send message to their team as well. Patient is interested in holistic options for her chronic pain and  wishes to gain [facility] RNCM assistance for care coordination with acupuncture. Patient also wants assistance with finding an eye doctor. Patient was advised to contact her insurance. [facility] LCSW placed referral. Patient will be contacted on 08/27/21 by [facility] RNCM. Patient was sent another mental health provider list on 08/24/21 and successfully received the last resource list sent by [facility] LCSW. Patient wishes to increase her SSRI to 100 mg. [facility] LCSW will send message to PCP with this request.  08/07/21- CSW spoke with pt who reports she was not able to be seen at [facility] due to out of network for her insurance. CSW suggested pt call the Customer Service # on her insurance card to seek options- CSW will also research options with colleague who is most familiar with Managed Medicaid. Similarly, pt  reports she has not been able to find a Psychiatrist or an eye doctor that takes her insurance.  CSW completed a depression screening with pt; pt scoring "21"  which is much higher than last month ("8"). Pt denies SI/HI. She is in the midst of moving to a new home (down the street) and feels she "hit a brick wall and don't know what to do".  Pt does not feel the RX is helping her depression and states it may be making it worse. Will alert PCP to this.  Pt admits to feeling "consumed" and also states "I have hope".    08/09/21- Patient was referred to [facility] on 08/09/21 for both counseling and psychiatry. She is not suicidal but was encouraged to consdier [facility] walk in clinic for support if needed. Patient's main support network consist of only her husband. Patient reports that her mother has untreated bipolar and that she is a trigger to her mental health. Email sent to patient with available mental health resources within her area on 08/09/21. Patient confirmed that she received this email.   09/07/21- Patient was informed that [facility] declined her referral for psychiatry and counseling because she is not a [facility] resident. [facility] LCSW encouraged patient to go to [facility] to enroll in their program for services. Patient is agreeable to do so. She reports that she cannot go until next week but will make this a priority. [facility] LCSW will make one last follow up call in 60 days to ensure she was set up with mental health services with [facility]. She was educated on their program and new patient intake process. Patient reports that she spoke to [facility] RNCM regarding her chronic pain which has affected her mental health. Email sent to patient on 09/07/21 with [facility] information and she confirmed while on the phone that she successfully received it.   Depression screen [facility] 2/9 08/07/2021 07/03/2021 06/25/2021 05/14/2021 09/05/2020  Decreased Interest 3 1 1 3  0  Down, Depressed, Hopeless 3 0 0 3 0  PHQ - 2 Score 6 1 1 6  0   Altered sleeping 2 1 1 3 2   Tired, decreased energy 3 3 3 3 3   Change in appetite 3 0 0 3 1  Feeling bad or failure about yourself  1 0 0 3 0  Trouble concentrating 3 3 3 3 3   Moving slowly or fidgety/restless 3 0 0 3 1  Suicidal thoughts 0 0 0 0 0  PHQ-9 Score 21 8 8 24 10   Difficult doing work/chores Somewhat difficult Somewhat difficult Somewhat difficult Very difficult -    [facility]ST [facility] BSW UPDATE: 07/24/21- CSW spoke with pt who reports having  some consistent "dips" in her depression state; wants to ask PCP about increasing her dose. Validated her inquiry and plans to email PCP.  Pt denies SI/HI-  She is on the waiting list for an appointment with [facility] foundation for counseling support- advised her to expect a call from them to schedule.  Pt inquiring about getting a vision evaluation by an Eye MD that takes Medicaid due to concern for glaucoma- stating her sister has recently been diagnosed with early onset glaucoma at age of 32yo.  Will pursue resources for vision care and advise pt of this asap.  1:1 collaboration with primary care provider regarding development and update of comprehensive plan of care as evidenced by provider attestation and co-signature Inter-disciplinary care team collaboration (see longitudinal plan of care) Evaluation of current treatment plan related to  self management and patient's adherence to plan as established by provider Review resources, discussed options and provided patient information about  Advance Directive Education (discussed and encouraged pt to consider completion)  Patient Self-Care Activities: -continue with medications as prescribed -call insurance provider # and inquire about in-network options for Psychiatry, Optometry and attention specialist -plan to go in for an intake at [facility] --review material emailed to you on 08/09/21 - keep 90 percent of counseling appointments  -call 911 or [phone] as discussed for emergencies/support        Follow up:  Patient agrees to Care Plan and Follow-up.  Plan: The Managed Medicaid care management team will reach out to the patient again over the next 60 days.  Date/time of next scheduled Social Work care management/care coordination outreach:  11/07/21 at 1:00 pm.  Dickie La, BSW, MSW, LCSW Managed Medicaid LCSW [facility]  [facility] [email or web address] Phone: [phone]

## 2021-09-07 NOTE — Patient Instructions (Signed)
Visit Information ? ?Kristin Baker was given information about Medicaid Managed Care team care coordination services as a part of their [place] Medicaid benefit. Kristin Baker verbally consented to engagement with the [facility] Managed Care team.  ? ?If you are experiencing a medical emergency, please call 911 or report to your local emergency department or urgent care.  ? ?If you have a non-emergency medical problem during routine business hours, please contact your provider's office and ask to speak with a nurse.  ? ?For questions related to your [facility], please call: [phone] or visit the homepage here: [email or web address] ? ?If you would like to schedule transportation through your [facility], please call the following number at least 2 days in advance of your appointment: [phone]. ? Rides for urgent appointments can also be made after hours by calling Member Services. ? ?Call the [place] at [phone], at any time, 24 hours a day, 7 days a week. If you are in danger or need immediate medical attention call 911. ? ?If you would like help to quit smoking, call 1-800-QUIT-NOW (223)586-0388) OR Espa?ol: 1-855-D?jelo-Ya 7142448836) o para m?s informaci?n haga clic aqu? or Text READY to 200-400 to register via text ? ?Kristin Baker - following are the goals we discussed in your visit today:  ? Goals Addressed   ? ?  ?  ?  ?  ?  ? This Visit's Progress  ?   Begin and Stick with Counseling-Depression/Anxiety and ADHD (pt-stated)     ?   Timeframe:  Long-Range Goal ?Priority:  High ?Start Date:  07/03/21                           ?Expected End Date:   11/07/21 at 1:00 pm ? ?  ?-continue with medications as prescribed ?-call insurance provider # and inquire about in-network options for Psychiatry, Optometry and attention specialist ?-plan for  appointment with [facility] (counseling) -- Patient is eligible for their grant program in order to work with their intern but prefers a different referral.  ?--review material mailed to you for ADHD evaluation ?- keep 16 percent of counseling appointments  ?-call 911 or [phone] as discussed for emergencies/support ?  ?Why is this important?   ?Beating depression may take some time.  ?If you don't feel better right away, don't give up on your treatment plan.  ?  ?Notes:  ?  ? ?  ? ?Following is a copy of your plan of care:  ?Care Plan : [place]  ?Updates made by Greg Cutter, LCSW since 09/07/2021 12:00 AM  ?  ? ?Problem: Symptoms (Depression, Anxiety, ADHD)   ?Priority: High  ?  ? ?Long-Range Goal: Symptoms Monitored and Managed   ?Start Date: 07/03/2021  ?Expected End Date: 10/31/2021  ?Recent Progress: On track  ?Priority: High  ?Note:   ?Current Barriers:  ?Mental Health Concerns  and Lacks knowledge of community resource: For therapy and psychiatry   ? ?CSW Clinical Goal(s):  ?Patient  verbalize understanding of plan for management of Anxiety, Depression, and ADHD  through collaboration with Clinical Social Worker, provider, and care team.  ? ?Timeframe:  Long-Range Goal ?Priority:  High ?Start Date:    07/03/21             ?Expected End Date:  11/07/21           ?  ?Follow  Up Date - 11/07/21 ?  ?- begin personal counseling ?- call and visit an old friend ?- check out volunteer opportunities ?- join a support group ?- laugh; watch a funny movie or comedian ?- learn and use visualization or guided imagery ?- perform a random act of kindness ?- practice relaxation or meditation daily ?- start or continue a personal journal ?- talk about feelings with a friend, family or spiritual advisor ?- practice positive thinking and self-talk  ?  ?Why is this important?   ?When you are stressed, down or upset, your body reacts too.  ?For example, your blood pressure may get higher; you may have a headache or  stomachache.  ?When your emotions get the best of you, your body's ability to fight off cold and flu gets weak.  ?These steps will help you manage your emotions.   ?  ?Current Barriers:  ?Limited social support, Mental Health Concerns , Social Isolation, and Lacks knowledge of community resource: available mental health resources  ?Lacks knowledge of how to connect  ?Unable to locate a provider that will take insurance ?  ?CSW Clinical Goal(s):  ?Patient  will demonstrate a reduction in symptoms related to :Anxiety with Excessive Worry,, Depression: depressed mood, Anxiety and ADHD ?Explore community resource options for unmet needs related to her mental health ?patient will work with SW to address concerns related to finding a therapist and psychiatrist  ?patient will demonstrate improved health management independence as evidenced by increasing self-care and social support network through collaboration with [place] representative, provider, and care team.  ? ?Patient Self-Care Activities: ?-continue with medications as prescribed ?-call insurance provider # and inquire about in-network options for Psychiatry, Optometry and attention specialist ?-plan to go in for an intake at [facility] ?--review material emailed to you on 08/09/21 ?- keep 90 percent of counseling appointments  ?-call 911 or [phone] as discussed for emergencies/support ? ? ? ?  ?24- Hour Availability:  ?  ?[facility]  ?[place], [place] ?[place] [phone] ?Crisis [phone] ?  ?Family Service of the [employer] [phone] ?  ?[employer] Crisis Service  [phone]  ?  ?[place]  [phone] (after hours) ?  ?Therapeutic Alternative/Mobile Crisis   [phone] ?  ?[place] National Suicide Hotline  780 527 1343 (TALK) OR 988 ?  ?Call 911 or go to emergency room ?  ?[employer]  [phone]);  [place]  ?  ?[facility] ACCESS  ?[phone]); [place],  [place], [place], [place], [place], [place], [place] ?  ? ?  ?  ? ?[name], BSW, MSW, LCSW ?Managed Medicaid LCSW ?[facility] ?[email or web address] ?Phone: [phone] ? ? ?  ?  ?

## 2021-09-14 ENCOUNTER — Other Ambulatory Visit: Payer: Self-pay | Admitting: Allergy & Immunology

## 2021-09-26 ENCOUNTER — Ambulatory Visit: Payer: Medicaid Other | Admitting: Allergy & Immunology

## 2021-10-02 ENCOUNTER — Other Ambulatory Visit: Payer: Self-pay | Admitting: *Deleted

## 2021-10-02 ENCOUNTER — Other Ambulatory Visit: Payer: Medicaid Other

## 2021-10-02 NOTE — Patient Instructions (Signed)
Visit Information ? ?Kristin Baker was given information about Medicaid Managed Care team care coordination services as a part of their [facility] Community Plan Medicaid benefit. Kristin Baker verbally consented to engagement with the [facility] Managed Care team.  ? ?If you are experiencing a medical emergency, please call 911 or report to your local emergency department or urgent care.  ? ?If you have a non-emergency medical problem during routine business hours, please contact your provider's office and ask to speak with a nurse.  ? ?For questions related to your [facility], please call: [phone] or visit the homepage here: [email or web address] ? ?If you would like to schedule transportation through your [facility], please call the following number at least 2 days in advance of your appointment: [phone]. ? Rides for urgent appointments can also be made after hours by calling Member Services. ? ?Call the Behavioral Health Crisis Line at [phone], at any time, 24 hours a day, 7 days a week. If you are in danger or need immediate medical attention call 911. ? ?If you would like help to quit smoking, call 1-800-QUIT-NOW (484-693-0016) OR Espa?ol: 1-855-D?jelo-Ya 2818159397) o para m?s informaci?n haga clic aqu? or Text READY to 200-400 to register via text ? ?Kristin Baker, ? ? ?Please see education materials related to Migraines and Managing pain provided by MyChart link. ? ?Patient verbalizes understanding of instructions and care plan provided today and agrees to view in MyChart. Active MyChart status confirmed with patient.   ? ?Telephone follow up appointment with Managed Medicaid care management team member scheduled for:12/05/21 ? ?Estanislado Emms RN, BSN ?[place]  [facility] ?RN Care Coordinator ? ? ?Following is a copy of your plan of care:  ?Care Plan : RN Care  Manager Plan of Care  ?Updates made by [name], RN since 10/02/2021 12:00 AM  ?  ? ?Problem: Health Management needs related to Chronic Pain   ?  ? ?Long-Range Goal: Development of Plan of Care to address Health Management needs related to Chronic Pain   ?Start Date: 08/31/2021  ?Expected End Date: 12/05/2021  ?Priority: High  ?Note:   ?Current Barriers:  ?Chronic Disease Management support and education needs related to Chronic Pain Kristin Baker has increased pain since recent move. She would like alternative methods like acupuncture, but has learned that there aren't any locations nearby offering these services. She feels more relaxed in the new home and has not used her rescue inhaler since the move. ? ?RNCM Clinical Goal(s):  ?Patient will verbalize understanding of plan for management of chronic back pain as evidenced by patient verbalization of self monitoring activities ?continue to work with RN Care Manager and/or Social Worker to address care management and care coordination needs related to chronic back pain as evidenced by adherence to CM Team Scheduled appointments     through collaboration with Medical illustrator, provider, and care team.  ? ?Interventions: ?Inter-disciplinary care team collaboration (see longitudinal plan of care) ?Evaluation of current treatment plan related to  self management and patient's adherence to plan as established by provider ?Referral to BSW for assistance with Disability application, telephone appointment for 10/03/21 @ 9am scheduled ?Advised patient to discuss Emgality not being covered by insurance with Neurology ?Reviewed upcoming appointments: 10/18/21 with [facility], 10/24/21 with PCP and rescheduling missed Asthma and Allergy appointment ? ? ?Pain:  (Status: Goal on Track (progressing): YES.) Long Term Goal  ?Pain assessment performed ?Medications reviewed ?Discussed importance of  adherence to all scheduled medical appointments; ?Counseled on the importance of reporting  any/all new or changed pain symptoms or management strategies to pain management provider; ?Discussed use of relaxation techniques and/or diversional activities to assist with pain reduction (distraction, imagery, relaxation, massage, acupressure, TENS, heat, and cold application; ?Reviewed with patient prescribed pharmacological and nonpharmacological pain relief strategies; ?Assessed social determinant of health barriers;  ?Advised patient to eat foods that help decrease pain(blueberries, salmon, ginger, pumpkin seeds, turmeric), avoid sugar ?Discussed the benefits of exercise, encourages physical activity daily ?Advised to a follow up with [facility] for plan of care ?Advised to follow up with [facility] for shoulder pain ? ?Patient Goals/Self-Care Activities: ?Call provider office for new concerns or questions  ?Exercise daily, start with 10 minutes a day ?Eat foods to that help improve pain, avoid sugar ?Use heat/ice and relaxation to help improve pain ? ? ?  ?  ?

## 2021-10-02 NOTE — Patient Outreach (Signed)
?Medicaid Managed Care   ?Nurse Care Manager Note ? ?10/02/2021 ?Name:  Kristin Baker MRN:  [id number] DOB:  Aug 22, 1990 ? ?Kristin Baker is an 31 y.o. year old female who is a primary patient of [name], FNP.  The [facility] Managed Care Coordination team was consulted for assistance with:    ?Pain ? ?[name] was given information about Medicaid Managed Care Coordination team services today. [name] Patient agreed to services and verbal consent obtained. ? ?Engaged with patient by telephone for follow up visit in response to provider referral for case management and/or care coordination services.  ? ?Assessments/Interventions:  Review of past medical history, allergies, medications, health status, including review of consultants reports, laboratory and other test data, was performed as part of comprehensive evaluation and provision of chronic care management services. ? ?SDOH (Social Determinants of Health) assessments and interventions performed: ?SDOH Interventions   ? ?Flowsheet Row Most Recent Value  ?SDOH Interventions   ?Transportation Interventions Intervention Not Indicated  ? ?  ? ? ?Care Plan ? ?Allergies  ?Allergen Reactions  ? Flexeril [Cyclobenzaprine] Hives  ? Lactose Intolerance (Gi) Diarrhea  ? Honeysuckle Flower [Lonicera] Rash  ? Mixed Grasses Rash  ? ? ?Medications Reviewed Today   ? ? Reviewed by Melissa Montane, RN (Registered Nurse) on 10/02/21 at [place] List Status: <None>  ? ?Medication Order Taking? Sig Documenting Provider Last Dose Status Informant  ?azelastine (ASTELIN) 0.1 % nasal spray [id number] Yes 2 sprays per nostril 1-2 times daily as needed. [name], MD Taking Active   ?Cholecalciferol (VITAMIN D3) 25 MCG (1000 UT) CAPS [id number] Yes Take 1 capsule (1,000 Units total) by mouth daily. [name], FNP Taking Active   ?fluticasone (FLOVENT HFA) 110 MCG/ACT inhaler [id number] Yes Inhale 2 puffs into the lungs 2 (two) times daily. With  spacer. [name], MD Taking Active   ?Galcanezumab-gnlm (EMGALITY) 120 MG/ML SOAJ [id number] Yes Inject 1 pen into the skin every 28 (twenty-eight) days. Sater, Nanine Means, MD Taking Active   ?hydrochlorothiazide (HYDRODIURIL) 12.5 MG tablet [id number] Yes TAKE 1 TABLET(12.5 MG) BY MOUTH DAILY [name], [name], FNP Taking Active   ?ibuprofen (ADVIL) 800 MG tablet [id number] Yes Take 800 mg by mouth every 8 (eight) hours as needed for moderate pain. [provider] Taking Active   ?levocetirizine (XYZAL) 5 MG tablet [id number] Yes Take 1 tablet (5 mg total) by mouth in the morning and at bedtime. [name], MD Taking Active   ?levothyroxine (SYNTHROID) 112 MCG tablet [id number] Yes Take 1 tablet (112 mcg total) by mouth daily. [name], FNP Taking Active   ?rizatriptan (MAXALT-MLT) 10 MG disintegrating tablet [id number] Yes Take 1 tablet (10 mg total) by mouth as needed for migraine. May repeat in 2 hours if needed.  No more than 2/day ot 10/month Sater, Nanine Means, MD Taking Active   ?sertraline (ZOLOFT) 100 MG tablet [id number] Yes Take 1 tablet (100 mg total) by mouth daily. [name], FNP Taking Active   ?triamcinolone (NASACORT) 55 MCG/ACT AERO nasal inhaler [id number] Yes Place 1 spray into the nose daily. [name], MD Taking Active   ?VENTOLIN HFA 108 (90 Base) MCG/ACT inhaler [id number] Yes INHALE 2 PUFFS INTO THE LUNGS EVERY 4 HOURS AS NEEDED FOR WHEEZING OR SHORTNESS OF BREATH OR COUGH [name] [name] Daily, MD Taking Active   ? ?  ?  ? ?  ? ? ?Patient Active Problem List  ? Diagnosis Date Noted  ?  Left shoulder pain 08/30/2021  ? Seasonal and perennial allergic rhinitis 07/18/2021  ? Hyperlipidemia 06/25/2021  ? H/O multiple allergies 05/14/2021  ? Anxiety and depression 05/14/2021  ? Morbid obesity (HCC) 05/14/2021  ? Hypertension 05/14/2021  ? Allergies 05/14/2021  ? Chronic bilateral low back pain without sciatica 05/14/2021  ? Vitamin D  deficiency 01/15/2021  ? Chronic migraine w/o aura, not intractable, w/o stat migr 01/15/2021  ? Chronic daily headache 01/15/2021  ? White matter abnormality on MRI of brain 01/15/2021  ? Acquired hypothyroidism 07/10/2020  ? Essential hypertension 07/10/2020  ? DDD (degenerative disc disease), lumbar 01/11/2020  ? ? ?Conditions to be addressed/monitored per PCP order:   Pain ? ?Care Plan : RN Care Manager Plan of Care  ?Updates made by [name], RN since 10/02/2021 12:00 AM  ?  ? ?Problem: Health Management needs related to Chronic Pain   ?  ? ?Long-Range Goal: Development of Plan of Care to address Health Management needs related to Chronic Pain   ?Start Date: 08/31/2021  ?Expected End Date: 12/05/2021  ?Priority: High  ?Note:   ?Current Barriers:  ?Chronic Disease Management support and education needs related to Chronic Pain Ms. Jian has increased pain since recent move. She would like alternative methods like acupuncture, but has learned that there aren't any locations nearby offering these services. She feels more relaxed in the new home and has not used her rescue inhaler since the move. ? ?RNCM Clinical Goal(s):  ?Patient will verbalize understanding of plan for management of chronic back pain as evidenced by patient verbalization of self monitoring activities ?continue to work with RN Care Manager and/or Social Worker to address care management and care coordination needs related to chronic back pain as evidenced by adherence to CM Team Scheduled appointments     through collaboration with Medical illustrator, provider, and care team.  ? ?Interventions: ?Inter-disciplinary care team collaboration (see longitudinal plan of care) ?Evaluation of current treatment plan related to  self management and patient's adherence to plan as established by provider ?Referral to BSW for assistance with Disability application, telephone appointment for 10/03/21 @ 9am scheduled ?Advised patient to discuss Emgality not  being covered by insurance with Neurology ?Reviewed upcoming appointments: 10/18/21 with [facility], 10/24/21 with PCP and rescheduling missed Asthma and Allergy appointment ? ? ?Pain:  (Status: Goal on Track (progressing): YES.) Long Term Goal  ?Pain assessment performed ?Medications reviewed ?Discussed importance of adherence to all scheduled medical appointments; ?Counseled on the importance of reporting any/all new or changed pain symptoms or management strategies to pain management provider; ?Discussed use of relaxation techniques and/or diversional activities to assist with pain reduction (distraction, imagery, relaxation, massage, acupressure, TENS, heat, and cold application; ?Reviewed with patient prescribed pharmacological and nonpharmacological pain relief strategies; ?Assessed social determinant of health barriers;  ?Advised patient to eat foods that help decrease pain(blueberries, salmon, ginger, pumpkin seeds, turmeric), avoid sugar ?Discussed the benefits of exercise, encourages physical activity daily ?Advised to a follow up with [facility] for plan of care ?Advised to follow up with [facility] for shoulder pain ? ?Patient Goals/Self-Care Activities: ?Call provider office for new concerns or questions  ?Exercise daily, start with 10 minutes a day ?Eat foods to that help improve pain, avoid sugar ?Use heat/ice and relaxation to help improve pain ? ? ?  ? ? ?Follow Up:  Patient agrees to Care Plan and Follow-up. ? ?Plan: The Managed Medicaid care management team will reach out to the patient again  over the next 60 days. ? ?Date/time of next scheduled RN care management/care coordination outreach:  12/05/21 @ 9am ? ?Lurena Joiner RN, BSN ?[place] ?RN Care Coordinator ? ?

## 2021-10-03 ENCOUNTER — Other Ambulatory Visit: Payer: Self-pay

## 2021-10-03 NOTE — Patient Instructions (Signed)
Visit Information ? ?Ms. Jonita AlbeeShannon Salinger  - as a part of your Medicaid benefit, you are eligible for care management and care coordination services at no cost or copay. I was unable to reach you by phone today but would be happy to help you with your health related needs. Please feel free to call me @ [phone]  ? ?A member of the Managed Medicaid care management team will reach out to you again over the next 7 days.  ? ?Gus PumaAlexis Christa Fasig, BSW, MHA ?[facility]  [place]  ?High Risk Managed Medicaid Team  ?[phone]  ?

## 2021-10-03 NOTE — Patient Outreach (Signed)
Care Coordination ? ?10/03/2021 ? ?Kristin AlbeeShannon Hartshorne ?1990-09-14 ?[id number] ? ? ?Medicaid Managed Care  ? ?Unsuccessful Outreach Note ? ?10/03/2021 ?Name: Kristin AlbeeShannon Girdner MRN: [id number] DOB: 1990-09-14 ? ?Referred by: Donell BeersPaseda, Folashade R, FNP ?Reason for referral : High Risk Managed Medicaid (MM Social work Unsuccessful telephone outreach) ? ? ?An unsuccessful telephone outreach was attempted today. The patient was referred to the case management team for assistance with care management and care coordination.  ? ?Follow Up Plan: The care management team will reach out to the patient again over the next 7 days.  ? ?Gus PumaAlexis Felecity Lemaster, BSW, MHA ?[facility]  [place]  ?High Risk Managed Medicaid Team  ?[phone]  ?

## 2021-10-10 ENCOUNTER — Telehealth: Payer: Self-pay | Admitting: Neurology

## 2021-10-10 ENCOUNTER — Encounter: Payer: Self-pay | Admitting: Nurse Practitioner

## 2021-10-10 ENCOUNTER — Encounter: Payer: Self-pay | Admitting: Neurology

## 2021-10-10 NOTE — Telephone Encounter (Signed)
Pt made an appt to see Malachi Bonds as Mitzi Davenport schedule was full 10-11-21 at 840 ?

## 2021-10-10 NOTE — Telephone Encounter (Signed)
PA completed over CMM/optum ?KEY:  [id number] ?PA approved immediately ? ?EMGALITY INJ 120MG /ML is approved through 10/11/2022 ?

## 2021-10-11 ENCOUNTER — Ambulatory Visit (INDEPENDENT_AMBULATORY_CARE_PROVIDER_SITE_OTHER): Payer: Medicaid Other | Admitting: Family Medicine

## 2021-10-11 ENCOUNTER — Encounter: Payer: Self-pay | Admitting: Family Medicine

## 2021-10-11 VITALS — BP 122/80 | HR 64 | Ht 67.0 in | Wt 309.6 lb

## 2021-10-11 DIAGNOSIS — S99922A Unspecified injury of left foot, initial encounter: Secondary | ICD-10-CM

## 2021-10-11 NOTE — Progress Notes (Addendum)
New Patient Office Visit  Subjective:  Patient ID: Kristin Baker, female    DOB: 02/25/1991  Age: 31 y.o. MRN: [id number]  CC:  Chief Complaint  Patient presents with   Foot Swelling    Pt states she was running and heard a pop on left foot 10/07/2021, pt has swelling on left foot, states she broke her foot 14 years ago was supposed to have surgery and never had a surgery.     HPI Kristin Baker is a 31 y.o. female with past medical history of essential hypertension, anxiety, and depression presents with complaints of left foot pain and swelling on "sunday evening (10/07/21). She reports that 10/07/21, she heard a pop sound when she stepped in a hold while running after her nephew in the yard. She kept running after her nephew and didn't realize she was hurting until the evening. Since then, she has reported swelling, pain with ambulation, and pain and rests. She noted that the pain radiates from the left heel to her calf. ROM is intact and can bear weight on the affected foot.   She shares that she broke a bone in her left foot 14 years ago but never sought medical help until six months after the injury. She went to see a podiatrist, and imaging studies (X-ray and MRI) were done with a recommendation for surgery, but she declined. She reports being 31 years old then.   Past Medical History:  Diagnosis Date   Abdominal pain    ADHD    ANA positive    Back pain    DDD (degenerative disc disease), lumbar    Eye pain, right    Fatigue    Frontal sinusitis    Hypertension    Hypothyroidism    Obesity, morbid (HCC)    Palpitations    Rheumatoid arthritis (HCC)     Past Surgical History:  Procedure Laterality Date   ABLATION     12" /27/2021 left side, 06/14/2020 right side     Family History  Problem Relation Age of Onset   Hyperlipidemia Mother    Peripheral Artery Disease Mother    ADD / ADHD Mother    Bipolar disorder Mother    Cervical cancer Mother        at age 10344.    Diabetes Father    Heart attack Father    ADD / ADHD Sister    Healthy Daughter    ADD / ADHD Maternal Aunt    Multiple sclerosis Paternal Aunt    Cervical cancer Maternal Grandmother    Breast cancer Maternal Grandmother    Skin cancer Maternal Grandmother    Heart attack Maternal Grandfather    Colon cancer Neg Hx     Social History   Socioeconomic History   Marital status: Married    Spouse name: Not on file   Number of children: 0   Years of education: Not on file   Highest education level: Associate degree: academic program  Occupational History   Not on file  Tobacco Use   Smoking status: Former    Packs/day: 1.00    Years: 15.00    Pack years: 15.00    Types: Cigarettes    Quit date: 11/24/2020    Years since quitting: 0.8   Smokeless tobacco: Never   Tobacco comments:    She has quit of and on.  Vaping Use   Vaping Use: Former  Substance and Sexual Activity   Alcohol use: Yes  Comment: occasionally /social events   Drug use: Not Currently    Comment: smoked weed during teen years   Sexual activity: Yes  Other Topics Concern   Not on file  Social History Narrative   Married for May 2021.Lives with husband and daughter.Homemaker.Husband works at [employer].   Social Determinants of Health   Financial Resource Strain: Not on file  Food Insecurity: No Food Insecurity   Worried About Programme researcher, broadcasting/film/video in the Last Year: Never true   Ran Out of Food in the Last Year: Never true  Transportation Needs: No Transportation Needs   Lack of Transportation (Medical): No   Lack of Transportation (Non-Medical): No  Physical Activity: Not on file  Stress: Stress Concern Present   Feeling of Stress : To some extent  Social Connections: Not on file  Intimate Partner Violence: Not At Risk   Fear of Current or Ex-Partner: No   Emotionally Abused: No   Physically Abused: No   Sexually Abused: No    ROS Review of Systems  HENT:  Negative for  congestion, sinus pressure and sinus pain.   Eyes:  Negative for pain (eye appt with eye doctor / fmh of glaucoma 5/25), redness and visual disturbance.  Respiratory:  Negative for chest tightness and shortness of breath.   Cardiovascular:  Negative for chest pain.  Gastrointestinal:  Negative for diarrhea, nausea and vomiting.  Endocrine: Negative for polydipsia, polyphagia and polyuria.  Genitourinary:  Negative for dysuria and urgency.  Musculoskeletal:  Negative for back pain and neck pain.  Skin:  Negative for rash and wound.  Neurological:  Negative for dizziness and headaches.  Psychiatric/Behavioral:  Negative for confusion, self-injury and sleep disturbance.    Objective:   Today's Vitals: BP 122/80   Pulse 64   Ht 5\' 7"  (1.702 m)   Wt (!) 309 lb 9.6 oz (140.4 kg)   SpO2 99%   BMI 48.49 kg/m   Physical Exam HENT:     Head: Normocephalic.     Mouth/Throat:     Mouth: Mucous membranes are moist.  Cardiovascular:     Rate and Rhythm: Regular rhythm.     Pulses: Normal pulses.     Heart sounds: Normal heart sounds.  Pulmonary:     Breath sounds: Normal breath sounds.  Musculoskeletal:     Right foot: No swelling. Normal pulse.     Left foot: Normal range of motion. Swelling (+3) and tenderness present. Normal pulse.     Comments:  +2 dorsalis pedis and posterior tibial pulse bil Skin is warm to palpation    Skin:    General: Skin is warm.     Capillary Refill: Capillary refill takes less than 2 seconds.     Findings: No erythema.  Neurological:     Mental Status: She is alert and oriented to person, place, and time.    Assessment & Plan:   Problem List Items Addressed This Visit       Other   Injury of foot, left, initial encounter - Primary    +3 Edema noted on the left foot -ROM is intact, and pt can bear weight on the affected leg -will send a referral to podiatry for further evaluation given hx of a broken bone in her left foot 14 years ago   -encouraged conservative management of foot swelling, including leg elevation and ice and heat therapy -Body positioning -- Leg, ankle, and foot edema can be improved by elevating the legs  above heart level for 30 minutes three or four times per day. Elevating the legs may be sufficient to reduce or eliminate edema       Relevant Orders   Ambulatory referral to Podiatry    Outpatient Encounter Medications as of 10/11/2021  Medication Sig   azelastine (ASTELIN) 0.1 % nasal spray 2 sprays per nostril 1-2 times daily as needed.   Cholecalciferol (VITAMIN D3) 25 MCG (1000 UT) CAPS Take 1 capsule (1,000 Units total) by mouth daily.   fluticasone (FLOVENT HFA) 110 MCG/ACT inhaler Inhale 2 puffs into the lungs 2 (two) times daily. With spacer.   Galcanezumab-gnlm (EMGALITY) 120 MG/ML SOAJ Inject 1 pen into the skin every 28 (twenty-eight) days.   hydrochlorothiazide (HYDRODIURIL) 12.5 MG tablet TAKE 1 TABLET(12.5 MG) BY MOUTH DAILY   ibuprofen (ADVIL) 800 MG tablet Take 800 mg by mouth every 8 (eight) hours as needed for moderate pain.   levothyroxine (SYNTHROID) 112 MCG tablet Take 1 tablet (112 mcg total) by mouth daily.   rizatriptan (MAXALT-MLT) 10 MG disintegrating tablet Take 1 tablet (10 mg total) by mouth as needed for migraine. May repeat in 2 hours if needed.  No more than 2/day ot 10/month   sertraline (ZOLOFT) 100 MG tablet Take 1 tablet (100 mg total) by mouth daily.   triamcinolone (NASACORT) 55 MCG/ACT AERO nasal inhaler Place 1 spray into the nose daily.   VENTOLIN HFA 108 (90 Base) MCG/ACT inhaler INHALE 2 PUFFS INTO THE LUNGS EVERY 4 HOURS AS NEEDED FOR WHEEZING OR SHORTNESS OF BREATH OR COUGH   levocetirizine (XYZAL) 5 MG tablet Take 1 tablet (5 mg total) by mouth in the morning and at bedtime.   No facility-administered encounter medications on file as of 10/11/2021.    Follow-up: No follow-ups on file.   Gilmore Laroche, FNP

## 2021-10-11 NOTE — Patient Instructions (Addendum)
I appreciate the opportunity to provide care to you today!   Referral: urgent referral to podiatrist      Recommend conservative managements to help decrease swelling in your hands and feet.   -Reduce salt (sodium) in your diet -- Sodium, which is found in table salt and processed foods, can worsen edema. Reducing the amount of salt you consume can help to reduce edema, especially if you also take a diuretic.  -Compression stockings -- Leg edema can be prevented and treated with the use of compression stockings. Stockings are available in several heights, including knee-high, thigh-high, and pantyhose. Knee-high stockings are sufficient for most patients. - Body positioning -- Leg, ankle, and foot edema can be improved by elevating the legs above heart level for 30 minutes three or four times per day. Elevating the legs may be sufficient to reduce or eliminate edema    Please continue to a heart-healthy diet and increase your physical activities. Try to exercise for at least three times a week.      It was a pleasure to see you and I look forward to continuing to work together on your health and well-being. Please do not hesitate to call the office if you need care or have questions about your care.   Have a wonderful day and week. With Gratitude, Gilmore Laroche MSN, FNP-BC

## 2021-10-11 NOTE — Assessment & Plan Note (Addendum)
+  3 Edema noted on the left foot -ROM is intact, and pt can bear weight on the affected leg -will send a referral to podiatry for further evaluation given hx of a broken bone in her left foot 14 years ago  -encouraged conservative management of foot swelling, including leg elevation and ice and heat therapy -Body positioning -- Leg, ankle, and foot edema can be improved by elevating the legs above heart level for 30 minutes three or four times per day. Elevating the legs may be sufficient to reduce or eliminate edema

## 2021-10-12 ENCOUNTER — Other Ambulatory Visit: Payer: Self-pay

## 2021-10-12 NOTE — Patient Outreach (Signed)
Medicaid Managed Care Social Work Note  10/12/2021 Name:  Kristin Baker MRN:  [id number] DOB:  Sep 14, 1990  Kristin Baker is an 31 y.o. year old female who is a primary patient of [name], FNP.  The Medicaid Managed Care Coordination team was consulted for assistance with:  Community Resources   Kristin Baker was given information about Medicaid Managed Care Coordination team services today. [name] Patient agreed to services and verbal consent obtained.  Engaged with patient  for by telephone forinitial visit in response to referral for case management and/or care coordination services.   Assessments/Interventions:  Review of past medical history, allergies, medications, health status, including review of consultants reports, laboratory and other test data, was performed as part of comprehensive evaluation and provision of chronic care management services.  SDOH: (Social Determinant of Health) assessments and interventions performed: 10/12/21: BSW completed telephone outreach with patient regarding applying for disability. Patient stated she has never applied and does not know anyone that has applied and did not know what the first steps were. BSW provided patient with the website for [facility] and informed she could apply online or in person. No other resources are needed at this time.   Advanced Directives Status:  Not addressed in this encounter.  Care Plan                 Allergies  Allergen Reactions   Flexeril [Cyclobenzaprine] Hives   Lactose Intolerance (Gi) Diarrhea   Honeysuckle Flower [Lonicera] Rash   Mixed Grasses Rash    Medications Reviewed Today     Reviewed by Herbie Saxon, CMA (Certified Medical Assistant) on 10/11/21 at 0840  Med List Status: <None>   Medication Order Taking? Sig Documenting Provider Last Dose Status Informant  azelastine (ASTELIN) 0.1 % nasal spray [id number] Yes 2 sprays per nostril 1-2 times daily as needed. [name], MD Taking Active   Cholecalciferol (VITAMIN D3) 25 MCG (1000 UT) CAPS [id number] Yes Take 1 capsule (1,000 Units total) by mouth daily. [name], FNP Taking Active   fluticasone (FLOVENT HFA) 110 MCG/ACT inhaler [id number] Yes Inhale 2 puffs into the lungs 2 (two) times daily. With spacer. [name], MD Taking Active   Galcanezumab-gnlm [facility]) 120 MG/ML Ivory Broad [id number] Yes Inject 1 pen into the skin every 28 (twenty-eight) days. Sater, [name], MD Taking Active   hydrochlorothiazide (HYDRODIURIL) 12.5 MG tablet [id number] Yes TAKE 1 TABLET(12.5 MG) BY MOUTH DAILY [name], Baird Kay, FNP Taking Active   ibuprofen (ADVIL) 800 MG tablet [id number] Yes Take 800 mg by mouth every 8 (eight) hours as needed for moderate pain. [provider] Taking Active   levocetirizine (XYZAL) 5 MG tablet [id number]  Take 1 tablet (5 mg total) by mouth in the morning and at bedtime. [name], MD  Expired 10/02/21 2359   levothyroxine (SYNTHROID) 112 MCG tablet [id number] Yes Take 1 tablet (112 mcg total) by mouth daily. [name], FNP Taking Active   rizatriptan (MAXALT-MLT) 10 MG disintegrating tablet [id number] Yes Take 1 tablet (10 mg total) by mouth as needed for migraine. May repeat in 2 hours if needed.  No more than 2/day ot 10/month Sater, [name], MD Taking Active   sertraline (ZOLOFT) 100 MG tablet [id number] Yes Take 1 tablet (100 mg total) by mouth daily. [name], FNP Taking Active   triamcinolone (NASACORT) 55 MCG/ACT AERO nasal inhaler [id number] Yes Place 1 spray into the nose daily. [name], MD Taking Active  VENTOLIN HFA 108 (90 Base) MCG/ACT inhaler [id number] Yes INHALE 2 PUFFS INTO THE LUNGS EVERY 4 HOURS AS NEEDED FOR WHEEZING OR SHORTNESS OF BREATH OR COUGH Alfonse SpruceGallagher, Joel Louis, MD Taking Active             Patient Active Problem List   Diagnosis Date Noted   Injury of foot, left, initial encounter  10/11/2021   Left shoulder pain 08/30/2021   Seasonal and perennial allergic rhinitis 07/18/2021   Hyperlipidemia 06/25/2021   H/O multiple allergies 05/14/2021   Anxiety and depression 05/14/2021   Morbid obesity (HCC) 05/14/2021   Hypertension 05/14/2021   Allergies 05/14/2021   Chronic bilateral low back pain without sciatica 05/14/2021   Vitamin D deficiency 01/15/2021   Chronic migraine w/o aura, not intractable, w/o stat migr 01/15/2021   Chronic daily headache 01/15/2021   White matter abnormality on MRI of brain 01/15/2021   Acquired hypothyroidism 07/10/2020   Essential hypertension 07/10/2020   DDD (degenerative disc disease), lumbar 01/11/2020    Conditions to be addressed/monitored per PCP order:   Disability assistance  Care Plan : LCSW Plan of Care  Updates made by Shaune LeeksShields, Daiana Vitiello J since 10/12/2021 12:00 AM     Problem: Symptoms (Depression, Anxiety, ADHD)   Priority: High     Long-Range Goal: Symptoms Monitored and Managed   Start Date: 07/03/2021  Expected End Date: 10/31/2021  Recent Progress: On track  Priority: High  Note:   Current Barriers:  Mental Health Concerns  and Lacks knowledge of community resource: For therapy and psychiatry    CSW Clinical Goal(s):  Patient  verbalize understanding of plan for management of Anxiety, Depression, and ADHD  through collaboration with Clinical Social Worker, provider, and care team.   Timeframe:  Long-Range Goal Priority:  High Start Date:    07/03/21             Expected End Date:  11/07/21             Follow Up Date - 11/07/21   - begin personal counseling - call and visit an old friend - check out volunteer opportunities - join a support group - laugh; watch a funny movie or comedian - learn and use visualization or guided imagery - perform a random act of kindness - practice relaxation or meditation daily - start or continue a personal journal - talk about feelings with a friend, family or spiritual  advisor - practice positive thinking and self-talk    Why is this important?   When you are stressed, down or upset, your body reacts too.  For example, your blood pressure may get higher; you may have a headache or stomachache.  When your emotions get the best of you, your body's ability to fight off cold and flu gets weak.  These steps will help you manage your emotions.     Current Barriers:  Limited social support, Mental Health Concerns , Social Isolation, and Lacks knowledge of community resource: available mental health resources  Lacks knowledge of how to connect  Unable to locate a provider that will take insurance   CSW Clinical Goal(s):  Patient  will demonstrate a reduction in symptoms related to :Anxiety with Excessive Worry,, Depression: depressed mood, Anxiety and ADHD Explore community resource options for unmet needs related to her mental health patient will work with SW to address concerns related to finding a therapist and psychiatrist  patient will demonstrate improved health management independence as evidenced by increasing self-care and social support  network through collaboration with Visual merchandiser, provider, and care team.   Interventions:   08/24/21- Patient has not heard back from [facility] yet and was encouraged to contact facility today to inquire about her referrals. [facility] LCSW will send message to their team as well. Patient is interested in holistic options for her chronic pain and wishes to gain [facility] RNCM assistance for care coordination with acupuncture. Patient also wants assistance with finding an eye doctor. Patient was advised to contact her insurance. [facility] LCSW placed referral. Patient will be contacted on 08/27/21 by [facility] RNCM. Patient was sent another mental health provider list on 08/24/21 and successfully received the last resource list sent by [facility] LCSW. Patient wishes to increase her SSRI to 100 mg. [facility] LCSW will send message to PCP with this  request.  08/07/21- CSW spoke with pt who reports she was not able to be seen at [facility] due to out of network for her insurance. CSW suggested pt call the Customer Service # on her insurance card to seek options- CSW will also research options with colleague who is most familiar with Managed Medicaid. Similarly, pt reports she has not been able to find a Psychiatrist or an eye doctor that takes her insurance.  CSW completed a depression screening with pt; pt scoring "21"  which is much higher than last month ("8"). Pt denies SI/HI. She is in the midst of moving to a new home (down the street) and feels she "hit a brick wall and don't know what to do".  Pt does not feel the RX is helping her depression and states it may be making it worse. Will alert PCP to this.  Pt admits to feeling "consumed" and also states "I have hope".    08/09/21- Patient was referred to [facility] on 08/09/21 for both counseling and psychiatry. She is not suicidal but was encouraged to consdier [facility] walk in clinic for support if needed. Patient's main support network consist of only her husband. Patient reports that her mother has untreated bipolar and that she is a trigger to her mental health. Email sent to patient with available mental health resources within her area on 08/09/21. Patient confirmed that she received this email.   09/07/21- Patient was informed that [facility] declined her referral for psychiatry and counseling because she is not a [facility] resident. [facility] LCSW encouraged patient to go to [facility] to enroll in their program for services. Patient is agreeable to do so. She reports that she cannot go until next week but will make this a priority. [facility] LCSW will make one last follow up call in 60 days to ensure she was set up with mental health services with [facility]. She was educated on their program and new patient intake process. Patient reports that she spoke to [facility] RNCM regarding her chronic pain which  has affected her mental health. Email sent to patient on 09/07/21 with [facility] information and she confirmed while on the phone that she successfully received it.   10/12/21: BSW completed telephone outreach with patient regarding applying for disability. Patient stated she has never applied and does not know anyone that has applied and did not know what the first steps were. BSW provided patient with the website for [facility] and informed she could apply online or in person. No other resources are needed at this time.   Depression screen [facility] 2/9 08/07/2021 07/03/2021 06/25/2021 05/14/2021 09/05/2020  Decreased Interest 0  Down, Depressed, Hopeless 3 0 0 3 0  PHQ - 2 Score 0  Altered sleeping Tired, decreased energy Change in appetite 3 0 0 3 1  Feeling bad or failure about yourself  1 0 0 3 0  Trouble concentrating Moving slowly or fidgety/restless 3 0 0 3 1  Suicidal thoughts 0 0 0 0 0  PHQ-9 Score Difficult doing work/chores Somewhat difficult Somewhat difficult Somewhat difficult Very difficult -    PAST [facility] BSW UPDATE: 07/24/21- CSW spoke with pt who reports having some consistent "dips" in her depression state; wants to ask PCP about increasing her dose. Validated her inquiry and plans to email PCP.  Pt denies SI/HI-  She is on the waiting list for an appointment with [facility] foundation for counseling support- advised her to expect a call from them to schedule.  Pt inquiring about getting a vision evaluation by an Eye MD that takes Medicaid due to concern for glaucoma- stating her sister has recently been diagnosed with early onset glaucoma at age of 33yo.  Will pursue resources for vision care and advise pt of this asap.  1:1 collaboration with primary care provider regarding development and update of comprehensive plan of care as evidenced by provider attestation and co-signature Inter-disciplinary care team collaboration (see  longitudinal plan of care) Evaluation of current treatment plan related to  self management and patient's adherence to plan as established by provider Review resources, discussed options and provided patient information about  Advance Directive Education (discussed and encouraged pt to consider completion)  Patient Self-Care Activities: -continue with medications as prescribed -call insurance provider # and inquire about in-network options for Psychiatry, Optometry and attention specialist -plan to go in for an intake at [facility] --review material emailed to you on 08/09/21 - keep 90 percent of counseling appointments  -call [phone] or [phone] as discussed for emergencies/support       Follow up:  Patient agrees to Care Plan and Follow-up.  Plan: The Managed Medicaid care management team will reach out to the patient again over the next 30-60 days.  Date/time of next scheduled Social Work care management/care coordination outreach:  11/29/21  Gus Puma, Kenard Gower, [facility]  [facility]  High Risk Managed Medicaid Team  [phone]

## 2021-10-12 NOTE — Patient Instructions (Signed)
Visit Information  Kristin Baker was given information about Medicaid Managed Care team care coordination services as a part of their [facility] Medicaid benefit. Kristin Baker verbally consented to engagement with the [facility] Managed Care team.   If you are experiencing a medical emergency, please call 911 or report to your local emergency department or urgent care.   If you have a non-emergency medical problem during routine business hours, please contact your provider's office and ask to speak with a nurse.   For questions related to your [facility], please call: [phone] or visit the homepage here: [email or web address]  If you would like to schedule transportation through your [facility], please call the following number at least 2 days in advance of your appointment: [phone].  Rides for urgent appointments can also be made after hours by calling Member Services.  Call the Behavioral Health Crisis Line at [phone], at any time, 24 hours a day, 7 days a week. If you are in danger or need immediate medical attention call 911.  If you would like help to quit smoking, call 1-800-QUIT-NOW (737-753-2753) OR Espaol: 1-855-Djelo-Ya (0-045-997-7414) o para ms informacin haga clic aqu or Text READY to 239-532 to register via text  Kristin Baker - following are the goals we discussed in your visit today:   Goals Addressed   None     Social Worker will follow up in 30-60 days .   Kristin Baker, BSW, [place] [facility]  [place]  High Risk Managed Medicaid Team  [phone]   Following is a copy of your plan of care:  Care Plan : LCSW Plan of Care  Updates made by Kristin Baker since 10/12/2021 12:00 AM     Problem: Symptoms (Depression, Anxiety, ADHD)   Priority: High     Long-Range Goal: Symptoms Monitored and Managed    Start Date: 07/03/2021  Expected End Date: 10/31/2021  Recent Progress: On track  Priority: High  Note:   Current Barriers:  Mental Health Concerns  and Lacks knowledge of community resource: For therapy and psychiatry    CSW Clinical Goal(s):  Patient  verbalize understanding of plan for management of Anxiety, Depression, and ADHD  through collaboration with Clinical Social Worker, provider, and care team.   Timeframe:  Long-Range Goal Priority:  High Start Date:    07/03/21             Expected End Date:  11/07/21             Follow Up Date - 11/07/21   - begin personal counseling - call and visit an old friend - check out volunteer opportunities - join a support group - laugh; watch a funny movie or comedian - learn and use visualization or guided imagery - perform a random act of kindness - practice relaxation or meditation daily - start or continue a personal journal - talk about feelings with a friend, family or spiritual advisor - practice positive thinking and self-talk    Why is this important?   When you are stressed, down or upset, your body reacts too.  For example, your blood pressure may get higher; you may have a headache or stomachache.  When your emotions get the best of you, your body's ability to fight off cold and flu gets weak.  These steps will help you manage your emotions.     Current Barriers:  Limited social support, Mental Health Concerns ,  Social Isolation, and Lacks knowledge of community resource: available mental health resources  Lacks knowledge of how to connect  Unable to locate a provider that will take insurance   CSW Clinical Goal(s):  Patient  will demonstrate a reduction in symptoms related to :Anxiety with Excessive Worry,, Depression: depressed mood, Anxiety and ADHD Explore community resource options for unmet needs related to her mental health patient will work with SW to address concerns related to finding a therapist and psychiatrist   patient will demonstrate improved health management independence as evidenced by increasing self-care and social support network through collaboration with Clinical Social Worker, provider, and care team.   Interventions:   08/24/21- [facility] and was encouraged to contact facility today to inquire about her referrals. [facility] will send message to their team as well. Patient is interested in holistic options for her chronic pain and wishes to gain [facility] RNCM assistance for care coordination with acupuncture. Patient also wants assistance with finding an eye doctor. Patient was advised to contact her insurance. [facility] placed referral. Patient will be [facility] on 08/27/21 [facility]. Patient was sent another mental health provider list on 08/24/21 and successfully received the last resource list sent by [facility]W. Patient wishes to increase her SSRI to 100 mg. [facility]W will send message to PCP with this request.  08/07/21- CSW spoke with pt who reports she was not able to be seen at [facility] due to out of network for her insurance. CSW suggested pt call the Customer Service # on her insurance card to seek options- CSW will also research options with colleague who is most familiar with Managed Medicaid. Similarly, pt reports she has not been able to find a Psychiatrist or an eye doctor that takes her insurance.  CSW completed a depression screening with pt; pt scoring "21"  which is much higher than last month ("8"). Pt denies SI/HI. She is in the midst of moving to a new home (down the street) and feels she "hit a brick wall and don't know what to do".  Pt does not feel the RX is helping her depression and states it may be making it worse. Will alert PCP to this.  Pt admits to feeling "consumed" and also states "I have hope".    08/09/21- [facility] for both counseling and psychiatry. She is not suicidal but was encouraged to  [facility] walk in [facility] for support if needed. Patient's main support network consist of only her husband. Patient reports that her mother has untreated bipolar and that she is a trigger to her mental health. Email sent to patient with available mental health resources within her area on 08/09/21. Patient confirmed that she received this email.   09/07/21- Patient was informed that [facility] declined her referral for psychiatry and counseling [place]. [facility] encouraged patient to go to [facility] to enroll in their program for services. Patient is agreeable to do so. She reports that she cannot go until next week but will make this a priority. [facility] will make one last follow up call in 60 days to ensure she was set up with mental health services with [facility]. She was educated on their program and new patient intake process. Patient reports that [facility] regarding her chronic pain which has affected her mental health. Email sent to patient on  09/07/21 with [facility] information and she confirmed while on the phone that she successfully received it.   10/12/21: BSW completed telephone outreach with patient regarding applying for disability. Patient stated she has never applied and does not know anyone that has applied and did not know what the first steps were. BSW provided patient with the website for [facility] and informed she could apply online or in person. No other resources are needed at this time.   Depression screen [facility] 2/9 08/07/2021 07/03/2021 06/25/2021 05/14/2021 09/05/2020  Decreased Interest 0  Down, Depressed, Hopeless 3 0 0 3 0  PHQ - 2 Score 0  Altered sleeping Tired, decreased energy Change in appetite 3 0 0 3 1  Feeling bad or failure about yourself  1 0 0 3 0  Trouble concentrating Moving slowly or fidgety/restless 3 0 0 3 1  Suicidal thoughts 0 0 0 0 0  PHQ-9 Score Difficult  doing work/chores Somewhat difficult Somewhat difficult Somewhat difficult Very difficult -    PAST [facility] BSW UPDATE: 07/24/21- CSW spoke with pt who reports having some consistent "dips" in her depression state; wants to ask PCP about increasing her dose. Validated her inquiry and plans to email PCP.  Pt denies SI/HI-  She is on the waiting list for an appointment with [facility] foundation for counseling support- advised her to expect a call from them to schedule.  Pt inquiring about getting a vision evaluation by an Eye MD that takes Medicaid due to concern for glaucoma- stating her sister has recently been diagnosed with early onset glaucoma at age of 32yo.  Will pursue resources for vision care and advise pt of this asap.  1:1 collaboration with primary care provider regarding development and update of comprehensive plan of care as evidenced by provider attestation and co-signature Inter-disciplinary care team collaboration (see longitudinal plan of care) Evaluation of current treatment plan related to  self management and patient's adherence to plan as established by provider Review resources, discussed options and provided patient information about  Advance Directive Education (discussed and encouraged pt to consider completion)  Patient Self-Care Activities: -continue with medications as prescribed -call insurance provider # and inquire about in-network options for Psychiatry, Optometry and attention specialist -plan to go in for an intake at [facility] --review material emailed to you on 08/09/21 - keep 90 percent of counseling appointments  -call [phone] or [phone] as discussed for emergencies/support

## 2021-10-18 ENCOUNTER — Ambulatory Visit: Payer: Medicaid Other | Admitting: Podiatry

## 2021-10-18 ENCOUNTER — Ambulatory Visit (INDEPENDENT_AMBULATORY_CARE_PROVIDER_SITE_OTHER): Payer: Medicaid Other

## 2021-10-18 DIAGNOSIS — S99922A Unspecified injury of left foot, initial encounter: Secondary | ICD-10-CM

## 2021-10-18 DIAGNOSIS — T148XXA Other injury of unspecified body region, initial encounter: Secondary | ICD-10-CM

## 2021-10-18 MED ORDER — MELOXICAM 15 MG PO TABS: 15.0000 mg | ORAL_TABLET | Freq: Every day | ORAL | 0 refills | Status: DC

## 2021-10-18 NOTE — Progress Notes (Signed)
Subjective:  Patient ID: Kristin Baker, female    DOB: 07-Nov-1990,  MRN: [id number]  No chief complaint on file.   31 y.o. female presents with the above complaint.  Patient presents with complaint of left dorsal midfoot pain.  Patient states that she was chasing her kid and there was a small pothole that she may have put weight on it and she heard a snap.  She states her foot has been swollen since then.  She wanted to get evaluated she has not seen anyone else prior to seeing me.  She said hurts with ambulation pain is 8 out of 10.  The primary care physician was concerned for fracture.   Review of Systems: Negative except as noted in the HPI. Denies N/V/F/Ch.  Past Medical History:  Diagnosis Date   Abdominal pain    ADHD    ANA positive    Back pain    DDD (degenerative disc disease), lumbar    Eye pain, right    Fatigue    Frontal sinusitis    Hypertension    Hypothyroidism    Obesity, morbid (HCC)    Palpitations    Rheumatoid arthritis ([place])     Current Outpatient Medications:    meloxicam (MOBIC) 15 MG tablet, Take 1 tablet (15 mg total) by mouth daily., Disp: 30 tablet, Rfl: 0   azelastine (ASTELIN) 0.1 % nasal spray, 2 sprays per nostril 1-2 times daily as needed., Disp: 30 mL, Rfl: 5   Cholecalciferol (VITAMIN D3) 25 MCG (1000 UT) CAPS, Take 1 capsule (1,000 Units total) by mouth daily., Disp: 30 capsule, Rfl: 3   fluticasone (FLOVENT HFA) 110 MCG/ACT inhaler, Inhale 2 puffs into the lungs 2 (two) times daily. With spacer., Disp: 1 each, Rfl: 5   Galcanezumab-gnlm (EMGALITY) 120 MG/ML SOAJ, Inject 1 pen into the skin every 28 (twenty-eight) days., Disp: 3 mL, Rfl: 4   hydrochlorothiazide (HYDRODIURIL) 12.5 MG tablet, TAKE 1 TABLET(12.5 MG) BY MOUTH DAILY, Disp: 90 tablet, Rfl: 0   ibuprofen (ADVIL) 800 MG tablet, Take 800 mg by mouth every 8 (eight) hours as needed for moderate pain., Disp: , Rfl:    levocetirizine (XYZAL) 5 MG tablet, Take 1 tablet (5 mg total) by  mouth in the morning and at bedtime., Disp: 60 tablet, Rfl: 5   levothyroxine (SYNTHROID) 112 MCG tablet, Take 1 tablet (112 mcg total) by mouth daily., Disp: 90 tablet, Rfl: 3   rizatriptan (MAXALT-MLT) 10 MG disintegrating tablet, Take 1 tablet (10 mg total) by mouth as needed for migraine. May repeat in 2 hours if needed.  No more than 2/day ot 10/month, Disp: 10 tablet, Rfl: 11   sertraline (ZOLOFT) 100 MG tablet, Take 1 tablet (100 mg total) by mouth daily., Disp: 30 tablet, Rfl: 3   triamcinolone (NASACORT) 55 MCG/ACT AERO nasal inhaler, Place 1 spray into the nose daily., Disp: 16.9 mL, Rfl: 5   VENTOLIN HFA 108 (90 Base) MCG/ACT inhaler, INHALE 2 PUFFS INTO THE LUNGS EVERY 4 HOURS AS NEEDED FOR WHEEZING OR SHORTNESS OF BREATH OR COUGH, Disp: 18 g, Rfl: 1  Social History   Tobacco Use  Smoking Status Former   Packs/day: 1.00   Years: 15.00   Pack years: 15.00   Types: Cigarettes   Quit date: 11/24/2020   Years since quitting: 0.8  Smokeless Tobacco Never  Tobacco Comments   She has quit of and on.    Allergies  Allergen Reactions   Flexeril [Cyclobenzaprine] Hives   Lactose Intolerance (  Gi) Diarrhea   Honeysuckle Flower [Lonicera] Rash   Mixed Grasses Rash   Objective:  There were no vitals filed for this visit. There is no height or weight on file to calculate BMI. Constitutional Well developed. Well nourished.  Vascular Dorsalis pedis pulses palpable bilaterally. Posterior tibial pulses palpable bilaterally. Capillary refill normal to all digits.  No cyanosis or clubbing noted. Pedal hair growth normal.  Neurologic Normal speech. Oriented to person, place, and time. Epicritic sensation to light touch grossly present bilaterally.  Dermatologic Nails well groomed and normal in appearance. No open wounds. No skin lesions.  Orthopedic: Pain on palpation left dorsal midfoot as well as swelling nonpitting edema noted to the dorsal forefoot.  No pain with range of motion  mild pain with extensor resistant of the digits.  Consistent with extensor tendinitis.  No pain with flexor tendons.  No Lisfranc injury Lisfranc interval intact.  Negative piano key test   Radiographs: 3 views of skeletally mature adult left foot: Dorsal edema noted.  No fractures noted no Lisfranc injury is noted.  Lisfranc interval within normal limits are maintained. Assessment:   1. Foot injury, left, initial encounter   2. Contusion of soft tissue    Plan:  Patient was evaluated and treated and all questions answered.  Left dorsal soft tissue contusion -All questions and concerns were discussed with the patient extensive detail.  Given the amount of injury and pain that she is experiencing I believe she will benefit from cam boot immobilization.  She will also benefit from Mobic for pain control as well.  If there is no improvement we will discuss MRI during next clinical visit.  X-rays are negative for osseous break  No follow-ups on file.

## 2021-10-20 LAB — TSH+FREE T4
Free T4: 0.94 ng/dL (ref 0.82–1.77)
TSH: 3.39 u[IU]/mL (ref 0.450–4.500)

## 2021-10-22 NOTE — Progress Notes (Signed)
I will discuss results with patient at her upcoming appointment.  Normal TSH, continue current doe of synthroid.

## 2021-10-24 ENCOUNTER — Encounter: Payer: Self-pay | Admitting: Nurse Practitioner

## 2021-10-24 ENCOUNTER — Ambulatory Visit (INDEPENDENT_AMBULATORY_CARE_PROVIDER_SITE_OTHER): Payer: Medicaid Other | Admitting: Nurse Practitioner

## 2021-10-24 VITALS — BP 136/94 | HR 90 | Ht 67.0 in | Wt 310.0 lb

## 2021-10-24 DIAGNOSIS — F419 Anxiety disorder, unspecified: Secondary | ICD-10-CM

## 2021-10-24 DIAGNOSIS — Z0001 Encounter for general adult medical examination with abnormal findings: Secondary | ICD-10-CM

## 2021-10-24 DIAGNOSIS — E039 Hypothyroidism, unspecified: Secondary | ICD-10-CM | POA: Diagnosis not present

## 2021-10-24 DIAGNOSIS — S99922A Unspecified injury of left foot, initial encounter: Secondary | ICD-10-CM

## 2021-10-24 DIAGNOSIS — Z0000 Encounter for general adult medical examination without abnormal findings: Secondary | ICD-10-CM | POA: Insufficient documentation

## 2021-10-24 DIAGNOSIS — I10 Essential (primary) hypertension: Secondary | ICD-10-CM

## 2021-10-24 DIAGNOSIS — F32A Depression, unspecified: Secondary | ICD-10-CM

## 2021-10-24 DIAGNOSIS — R5383 Other fatigue: Secondary | ICD-10-CM

## 2021-10-24 NOTE — Progress Notes (Signed)
Complete physical exam  Patient: Kristin Baker   DOB: [birth year]   30 y.o. Female  MRN: [id number]  Subjective:    Chief Complaint  Patient presents with   Annual Exam    CPE   Fatigue    Worsened in the last 2-3 weeks    Kristin Baker is a 31 y.o. female who presents today for a complete physical exam. She reports consuming a low fat and low sodium diet.Does a lot of walking [place].  She generally feels fairly well. She reports sleeping poorly. She does have additional problems to discuss today.   States that she has been having extreme fatigue, exhaustion  for the past 3 weeks, thinks she might need a dose increase in her thyroid.  Patient denies shortness of breath, fever, chills.   She is wearing a cam boot on her left foot today, due to foot injury had a visit with podiatry 6 days ago.  Taking Mobic as needed.   Recently had her annual eye exam getting new glasses.   Most recent fall risk assessment:    10/24/2021    8:30 AM  Fall Risk   Falls in the past year? 0  Number falls in past yr: 0  Injury with Fall? 0  Risk for fall due to : No Fall Risks  Follow up Falls evaluation completed     Most recent depression screenings:    10/24/2021    8:30 AM 10/11/2021    8:40 AM  PHQ 2/9 Scores  PHQ - 2 Score 0 0  PHQ- 9 Score 0         Patient Care Team: [name], FNP as PCP - General (Nurse Practitioner) Quintella Reichert, MD as PCP - Cardiology (Cardiology) [name], LCSW as [facility] Care Management (Licensed Clinical Social Worker) [name], RN as Case Manager Shaune Leeks as Social Worker   Outpatient Medications Prior to Visit  Medication Sig Note   azelastine (ASTELIN) 0.1 % nasal spray 2 sprays per nostril 1-2 times daily as needed.    Cholecalciferol (VITAMIN D3) 25 MCG (1000 UT) CAPS Take 1 capsule (1,000 Units total) by mouth daily. 10/24/2021: 2,000 units daily    fluticasone (FLOVENT HFA) 110 MCG/ACT inhaler Inhale 2 puffs into the lungs 2 (two) times daily. With spacer.    Galcanezumab-gnlm (EMGALITY) 120 MG/ML SOAJ Inject 1 pen into the skin every 28 (twenty-eight) days.    hydrochlorothiazide (HYDRODIURIL) 12.5 MG tablet TAKE 1 TABLET(12.5 MG) BY MOUTH DAILY    ibuprofen (ADVIL) 800 MG tablet Take 800 mg by mouth every 8 (eight) hours as needed for moderate pain.    levothyroxine (SYNTHROID) 112 MCG tablet Take 1 tablet (112 mcg total) by mouth daily.    meloxicam (MOBIC) 15 MG tablet Take 1 tablet (15 mg total) by mouth daily.    rizatriptan (MAXALT-MLT) 10 MG disintegrating tablet Take 1 tablet (10 mg total) by mouth as needed for migraine. May repeat in 2 hours if needed.  No more than 2/day ot 10/month    sertraline (ZOLOFT) 100 MG tablet Take 1 tablet (100 mg total) by mouth daily.    triamcinolone (NASACORT) 55 MCG/ACT AERO nasal inhaler Place 1 spray into the nose daily.    VENTOLIN HFA 108 (90 Base) MCG/ACT inhaler INHALE 2 PUFFS INTO THE LUNGS EVERY 4 HOURS AS NEEDED FOR WHEEZING OR SHORTNESS OF BREATH OR COUGH    levocetirizine (XYZAL)  5 MG tablet Take 1 tablet (5 mg total) by mouth in the morning and at bedtime.    No facility-administered medications prior to visit.    Review of Systems  Constitutional:  Positive for malaise/fatigue. Negative for chills, fever and weight loss.  HENT: Negative.  Negative for ear discharge, ear pain, hearing loss and tinnitus.   Eyes:  Negative for pain, discharge and redness.  Respiratory: Negative.  Negative for cough, hemoptysis, sputum production, shortness of breath and wheezing.   Cardiovascular: Negative.   Gastrointestinal: Negative.  Negative for abdominal pain, diarrhea, heartburn, nausea and vomiting.  Genitourinary: Negative.  Negative for dysuria, frequency and urgency.  Musculoskeletal:  Positive for joint pain. Negative for back pain, myalgias and neck pain.  Skin: Negative.  Negative for  itching and rash.  Neurological: Negative.  Negative for dizziness, tingling, tremors, sensory change, speech change and headaches.  Endo/Heme/Allergies: Negative.  Negative for environmental allergies and polydipsia. Does not bruise/bleed easily.  Psychiatric/Behavioral: Negative.  Negative for depression, memory loss and substance abuse. The patient is not nervous/anxious.          Objective:     BP (!) 136/94 (BP Location: Left Arm, Cuff Size: Large)   Pulse 90   Ht  (1.702 m)   Wt (!) 310 lb (140.6 kg)   LMP 12/25/2020 (Approximate)   SpO2 97%   BMI 48.55 kg/m   Patient declined breast exam today. Physical Exam Constitutional:      General: She is not in acute distress.    Appearance: She is obese. She is not ill-appearing, toxic-appearing or diaphoretic.  HENT:     Head: Normocephalic and atraumatic.     Right Ear: Tympanic membrane, ear canal and external ear normal. There is no impacted cerumen.     Left Ear: Tympanic membrane, ear canal and external ear normal. There is no impacted cerumen.     Nose: Nose normal. No congestion or rhinorrhea.     Mouth/Throat:     Mouth: Mucous membranes are moist.     Pharynx: Oropharynx is clear. No oropharyngeal exudate or posterior oropharyngeal erythema.  Eyes:     General: No scleral icterus.       Right eye: No discharge.        Left eye: No discharge.     Extraocular Movements: Extraocular movements intact.     Conjunctiva/sclera: Conjunctivae normal.     Pupils: Pupils are equal, round, and reactive to light.  Neck:     Vascular: No carotid bruit.  Cardiovascular:     Rate and Rhythm: Normal rate and regular rhythm.     Pulses: Normal pulses.     Heart sounds: No murmur heard.   No friction rub. No gallop.  Pulmonary:     Effort: Pulmonary effort is normal. No respiratory distress.     Breath sounds: No stridor. No wheezing, rhonchi or rales.  Chest:     Chest wall: No tenderness.  Abdominal:     General:  There is no distension.     Palpations: Abdomen is soft. There is no mass.     Tenderness: There is no abdominal tenderness. There is no right CVA tenderness, left CVA tenderness, guarding or rebound.     Hernia: No hernia is present.  Musculoskeletal:        General: Signs of injury present.     Cervical back: Normal range of motion and neck supple. No rigidity or tenderness.     Comments: Wearing  a cam boot on the left leg   Lymphadenopathy:     Cervical: No cervical adenopathy.  Skin:    General: Skin is warm and dry.     Capillary Refill: Capillary refill takes less than 2 seconds.     Coloration: Skin is not jaundiced or pale.     Findings: No bruising, erythema, lesion or rash.  Neurological:     Mental Status: She is alert and oriented to person, place, and time.     Cranial Nerves: No cranial nerve deficit.     Sensory: No sensory deficit.     Motor: No weakness.     Coordination: Coordination normal.     Gait: Gait normal.     Deep Tendon Reflexes: Reflexes normal.  Psychiatric:        Mood and Affect: Mood normal.        Behavior: Behavior normal.        Thought Content: Thought content normal.        Judgment: Judgment normal.     No results found for any visits on 10/24/21.     Assessment & Plan:    Routine Health Maintenance and Physical Exam  Immunization History  Administered Date(s) Administered   Moderna Sars-Covid-2 Vaccination 10/08/2019, 11/05/2019   Tdap 08/31/2020    Health Maintenance  Topic Date Due   HIV Screening  08/31/2022 (Originally [birth year]/2005)   INFLUENZA VACCINE  12/25/2021   PAP SMEAR-Modifier  09/06/2023   TETANUS/TDAP  09/01/2030   Hepatitis C Screening  Completed   HPV VACCINES  Aged Out   COVID-19 Vaccine  Discontinued    Discussed health benefits of physical activity, and encouraged her to engage in regular exercise appropriate for her age and condition.  Problem List Items Addressed This Visit       Cardiovascular and  Mediastinum   Essential hypertension    BP Readings from Last 3 Encounters:  10/24/21 (!) 136/94  10/11/21 122/80  08/30/21 139/89  Currently on hydrochlorothiazide 12.5 mg daily Diastolic  BP slightly elevated today DASH diet advised need to engage in regular vigorous exercises at least for 50 minutes weekly discussed Monitor blood pressure at home goal is BP less than 140/90         Endocrine   Acquired hypothyroidism    Lab Results  Component Value Date   TSH 3.390 10/19/2021  Labs are normal continue levothyroxine 112 mcg tablets daily         Other   Anxiety and depression    States that she feels much better since she started taking zoloft  daily  PHQ 9 score today is 0 Denies IS, HI Continue current medication       Morbid obesity (HCC)    Wt Readings from Last 3 Encounters:  10/24/21 (!) 310 lb (140.6 kg)  10/11/21 (!) 309 lb 9.6 oz (140.4 kg)  08/30/21 (!) 301 lb (136.5 kg)  Has history of palpitations, insurance would not approve Saxenda in the past.  Patient referred to medical weight management clinic.  Need to increase intake of whole food consisting mainly vegetables and protein less carbohydrate drinking at least 64 ounces of water daily engaging in regular vigorous exercises at least 150 minutes weekly discussed with patient she verbalized understanding.       Relevant Orders   Amb Ref to Medical Weight Management   Injury of foot, left, initial encounter    Currently wearing a cam boot for immobilization She is being followed by  podiatry Continue Mobic as needed       Annual physical exam - Primary    Annual exam as documented.  Counseling done include healthy lifestyle involving committing to 150 minutes of exercise per week, heart healthy diet, and attaining healthy weight. The importance of adequate sleep also discussed.  Regular use of seat belt and home safety were also discussed . Changes in health habits are decided on by patient  with goals and time frames set for achieving them.        Fatigue    [place]  She has been having fatigue for the past 3 weeks Fatigue could be related to stress from recent moving Check CBC Patient encouraged to engage in regular vigorous exercises at least 150 minutes weekly adequate rest  encouraged       Relevant Orders   CBC   Return in about 6 months (around 04/25/2022) for HTN/hypotension.     Donell BeersFolashade R Quirino Kakos, FNP

## 2021-10-24 NOTE — Assessment & Plan Note (Signed)
BP Readings from Last 3 Encounters:  10/24/21 (!) 136/94  10/11/21 122/80  08/30/21 139/89  Currently on hydrochlorothiazide [id number] mg daily Diastolic  BP slightly elevated today DASH diet advised need to engage in regular vigorous exercises at least for 50 minutes weekly discussed Monitor blood pressure at home goal is BP less than 140/90

## 2021-10-24 NOTE — Assessment & Plan Note (Signed)
Currently wearing a cam boot for immobilization She is being followed by podiatry Continue Mobic as needed

## 2021-10-24 NOTE — Assessment & Plan Note (Addendum)
Wt Readings from Last 3 Encounters:  10/24/21 (!) 310 lb (140.6 kg)  10/11/21 (!) 309 lb 9.6 oz (140.4 kg)  08/30/21 (!) 301 lb (136.5 kg)  Has history of palpitations, insurance would not approve Saxenda in the past.  Patient referred to medical weight management clinic.  Need to increase intake of whole food consisting mainly vegetables and protein less carbohydrate drinking at least 64 ounces of water daily engaging in regular vigorous exercises at least 150 minutes weekly discussed with patient she verbalized understanding.

## 2021-10-24 NOTE — Assessment & Plan Note (Addendum)
States that she feels much better since she started taking zoloft 100mg  daily  PHQ 9 score today is 0 Denies IS, HI Continue current medication

## 2021-10-24 NOTE — Assessment & Plan Note (Signed)
Annual exam as documented.  Counseling done include healthy lifestyle involving committing to 150 minutes of exercise per week, heart healthy diet, and attaining healthy weight. The importance of adequate sleep also discussed.  Regular use of seat belt and home safety were also discussed . Changes in health habits are decided on by patient with goals and time frames set for achieving them.  

## 2021-10-24 NOTE — Assessment & Plan Note (Addendum)
Lab Results  Component Value Date   TSH 3.390 10/19/2021  Labs are normal continue levothyroxine 112 mcg tablets daily

## 2021-10-24 NOTE — Assessment & Plan Note (Signed)
[place]  She has been having fatigue for the past 3 weeks Fatigue could be related to stress from recent moving Check CBC Patient encouraged to engage in regular vigorous exercises at least 150 minutes weekly adequate rest  encouraged

## 2021-10-24 NOTE — Patient Instructions (Signed)

## 2021-10-25 LAB — CBC
Hematocrit: 40.9 % (ref 34.0–46.6)
Hemoglobin: 13.1 g/dL (ref 11.1–15.9)
MCH: 27 pg (ref 26.6–33.0)
MCHC: 32 g/dL (ref 31.5–35.7)
MCV: 84 fL (ref 79–97)
Platelets: 441 10*3/uL (ref 150–450)
RBC: 4.86 x10E6/uL (ref 3.77–5.28)
RDW: 14.2 % (ref 11.7–15.4)
WBC: 8.6 10*3/uL (ref 3.4–10.8)

## 2021-10-25 NOTE — Progress Notes (Signed)
CBC is normal.  Patient should make sure to get adequate rest and exercise daily.

## 2021-11-07 ENCOUNTER — Other Ambulatory Visit: Payer: Self-pay | Admitting: Licensed Clinical Social Worker

## 2021-11-07 NOTE — Patient Outreach (Signed)
Medicaid Managed Care Social Work Note  11/07/2021 Name:  Kristin Baker MRN:  [id number] DOB:  08-14-1990  Kristin Baker is an 31 y.o. year old female who is a primary Baker of Kristin Rival, FNP.  The Medicaid Managed Care Coordination team was consulted for assistance with:  [place] [employer]  [name] was given information about Medicaid Managed Care Coordination team services today. Kristin Baker to services and verbal consent obtained.  Engaged with Baker  for by telephone forfollow up visit in response to referral for case management and/or care coordination services.   Assessments/Interventions:  Review of past medical history, allergies, medications, health status, including review of consultants reports, laboratory and other test data, was performed as part of comprehensive evaluation and provision of chronic care management services.  SDOH: (Social Determinant of Health) assessments and interventions performed: SDOH Interventions    Flowsheet Row Most Recent Value  SDOH Interventions   Stress Interventions Offered Kristin Baker, Live Life Well       Advanced Directives Status:  See Care Plan for related entries.  Care Plan                 Allergies  Allergen Reactions   Poison Ivy Extract Anaphylaxis   Poison Oak Extract Anaphylaxis   Flexeril [Cyclobenzaprine] Hives   Lactose Intolerance (Gi) Diarrhea   Honeysuckle Flower [Lonicera] Rash   Mixed Grasses Rash    Medications Reviewed Today     Reviewed by Greg Cutter, LCSW (Social Worker) on 11/07/21 at [phone]  Med List Status: <None>   Medication Order Taking? Sig Documenting Provider Last Dose Status Informant  azelastine (ASTELIN) 0.1 % nasal spray [id number] No 2 sprays per nostril 1-2 times daily as needed. [name], MD Taking Active   Cholecalciferol (VITAMIN D3) 25 MCG (1000 UT) CAPS [id number] No Take 1 capsule (1,000  Units total) by mouth daily. Kristin Rival, FNP Taking Active            Med Note [name]   Wed Oct 24, 2021  8:29 AM) 2,000 units daily  fluticasone (FLOVENT HFA) 110 MCG/ACT inhaler [id number] No Inhale 2 puffs into the lungs 2 (two) times daily. With spacer. [name], MD Taking Active   Galcanezumab-gnlm [facility]) 120 MG/ML [name] [id number] No Inject 1 pen into the skin every 28 (twenty-eight) days. Sater, Nanine Means, MD Taking Active   hydrochlorothiazide (HYDRODIURIL) 12.5 MG tablet [id number] No TAKE 1 TABLET(12.5 MG) BY MOUTH DAILY [name], [name], FNP Taking Active   ibuprofen (ADVIL) 800 MG tablet [id number] No Take 800 mg by mouth every 8 (eight) hours as needed for moderate pain. [provider] Taking Active   levocetirizine (XYZAL) 5 MG tablet [id number] No Take 1 tablet (5 mg total) by mouth in the morning and at bedtime. [name], MD Taking Expired 10/02/21 2359   levothyroxine (SYNTHROID) 112 MCG tablet [id number] No Take 1 tablet (112 mcg total) by mouth daily. Kristin Rival, FNP Taking Active   meloxicam (MOBIC) 15 MG tablet [id number] No Take 1 tablet (15 mg total) by mouth daily. [name], DPM Taking Active   rizatriptan (MAXALT-MLT) 10 MG disintegrating tablet [id number] No Take 1 tablet (10 mg total) by mouth as needed for migraine. May repeat in 2 hours if needed.  No more than 2/day ot 10/month Britt Bottom, MD Taking Active   sertraline (ZOLOFT) 100 MG tablet [id number] No Take 1 tablet (100  mg total) by mouth daily. [name], FNP Taking Active   triamcinolone (NASACORT) 55 MCG/ACT AERO nasal inhaler [id number] No Place 1 spray into the nose daily. [name], MD Taking Active   VENTOLIN HFA 108 [phone]) MCG/ACT inhaler [id number] No INHALE 2 PUFFS INTO THE LUNGS EVERY 4 HOURS AS NEEDED FOR WHEEZING OR SHORTNESS OF BREATH OR COUGH [name], MD Taking Active             Baker  Active Problem List   Diagnosis Date Noted   Annual physical exam 10/24/2021   Fatigue 10/24/2021   Injury of foot, left, initial encounter 10/11/2021   Left shoulder pain 08/30/2021   Seasonal and perennial allergic rhinitis 07/18/2021   Hyperlipidemia 06/25/2021   H/O multiple allergies 05/14/2021   Anxiety and depression 05/14/2021   Morbid obesity (HCC) 05/14/2021   Hypertension 05/14/2021   Allergies 05/14/2021   Chronic bilateral low back pain without sciatica 05/14/2021   Vitamin D deficiency 01/15/2021   Chronic migraine w/o aura, not intractable, w/o stat migr 01/15/2021   Chronic daily headache 01/15/2021   White matter abnormality on MRI of brain 01/15/2021   Acquired hypothyroidism 07/10/2020   Essential hypertension 07/10/2020   DDD (degenerative disc disease), lumbar 01/11/2020    Conditions to be addressed/monitored per PCP order:  Anxiety and Depression  Care Plan : LCSW Plan of Care  Updates made by [name], LCSW since 11/07/2021 12:00 AM     Problem: Symptoms (Depression, Anxiety, ADHD)   Priority: High     Long-Range Goal: Symptoms Monitored and Managed   Start Date: 07/03/2021  Expected End Date: 10/31/2021  Recent Progress: On track  Priority: High  Note:   Current Barriers:  Mental Health Concerns  and Lacks knowledge of community resource: For therapy and psychiatry    CSW Clinical Goal(s):  Baker  verbalize understanding of plan for management of Anxiety, Depression, and ADHD  through collaboration with Clinical Social Worker, provider, and care team.   Timeframe:  Long-Range Goal Priority:  High Start Date:    07/03/21             Expected End Date:  11/07/21             Follow Up Date -None. Goal has been met and closed as of 11/07/21   - begin personal counseling - call and visit an old friend - check out volunteer opportunities - join a support group - laugh; watch a funny movie or comedian - learn and use visualization or guided  imagery - perform a random act of kindness - practice relaxation or meditation daily - start or continue a personal journal - talk about feelings with a friend, family or spiritual advisor - practice positive thinking and self-talk    Why is this important?   When you are stressed, down or upset, your body reacts too.  For example, your blood pressure may get higher; you may have a headache or stomachache.  When your emotions get the best of you, your body's ability to fight off cold and flu gets weak.  These steps will help you manage your emotions.     Current Barriers:  Limited social support, Mental Health Concerns , Social Isolation, and Lacks knowledge of community resource: available mental health resources  Lacks knowledge of how to connect  Unable to locate a provider that will take insurance   CSW Clinical Goal(s):  Baker  will demonstrate a reduction in  symptoms related to :Anxiety with Excessive Worry,, Depression: depressed mood, Anxiety and ADHD Explore community resource options for unmet needs related to her mental health Baker will work with SW to address concerns related to finding a therapist and psychiatrist  Baker will demonstrate improved health management independence as evidenced by increasing self-care and social support network through collaboration with Clinical Social Worker, provider, and care team.   Interventions:   08/24/21- Baker has not heard back from [facility] yet and was encouraged to contact facility today to inquire about her referrals. [facility] LCSW will send message to their team as well. Baker is interested in holistic options for her chronic pain and wishes to gain [facility] RNCM assistance for care coordination with acupuncture. Baker also wants assistance with finding an eye doctor. Baker was advised to contact her insurance. [facility] LCSW placed referral. Baker will be contacted on 08/27/21 by [facility] RNCM. Baker was sent another mental health provider  list on 08/24/21 and successfully received the last resource list sent by [facility] LCSW. Baker wishes to increase her SSRI to 100 mg. [facility] LCSW will send message to PCP with this request.  08/07/21- CSW spoke with pt who reports she was not able to be seen at [facility] due to out of network for her insurance. CSW suggested pt call the Customer Service # on her insurance card to seek options- CSW will also research options with colleague who is most familiar with Managed Medicaid. Similarly, pt reports she has not been able to find a Psychiatrist or an eye doctor that takes her insurance.  CSW completed a depression screening with pt; pt scoring "21"  which is much higher than last month ("8"). Pt denies SI/HI. She is in the midst of moving to a new home (down the street) and feels she "hit a brick wall and don't know what to do".  Pt does not feel the RX is helping her depression and states it may be making it worse. Will alert PCP to this.  Pt admits to feeling "consumed" and also states "I have hope".    08/09/21- Baker was referred to [facility] on 08/09/21 for both counseling and psychiatry. She is not suicidal but was encouraged to consdier [facility] walk in clinic for support if needed. Baker's main support network consist of only her husband. Baker reports that her mother has untreated bipolar and that she is a trigger to her mental health. Email sent to Baker with available mental health resources within her area on 08/09/21. Baker confirmed that she received this email.   09/07/21- Baker was informed that [facility] declined her referral for psychiatry and counseling because she is not a [facility] resident. [facility] LCSW encouraged Baker to go to [facility] to enroll in their program for services. Baker is agreeable to do so. She reports that she cannot go until next week but will make this a priority. [facility] LCSW will make one last follow up call in 60 days to ensure she was set up with  mental health services with [facility]. She was educated on their program and new Baker intake process. Baker reports that she spoke to [facility] RNCM regarding her chronic pain which has affected her mental health. Email sent to Baker on 09/07/21 with [facility] information and she confirmed while on the phone that she successfully received it.   10/12/21: BSW completed telephone outreach with Baker regarding applying for disability. Baker stated she has never applied and does not know anyone that  Medicaid Managed Care Social Work Note  11/07/2021 Name:  Kristin Baker MRN:  [id number] DOB:  08-14-1990  Kristin Baker is an 31 y.o. year old female who is a primary Baker of Kristin Rival, FNP.  The Medicaid Managed Care Coordination team was consulted for assistance with:  [place] [employer]  [name] was given information about Medicaid Managed Care Coordination team services today. Kristin Baker to services and verbal consent obtained.  Engaged with Baker  for by telephone forfollow up visit in response to referral for case management and/or care coordination services.   Assessments/Interventions:  Review of past medical history, allergies, medications, health status, including review of consultants reports, laboratory and other test data, was performed as part of comprehensive evaluation and provision of chronic care management services.  SDOH: (Social Determinant of Health) assessments and interventions performed: SDOH Interventions    Flowsheet Row Most Recent Value  SDOH Interventions   Stress Interventions Offered Kristin Baker, Live Life Well       Advanced Directives Status:  See Care Plan for related entries.  Care Plan                 Allergies  Allergen Reactions   Poison Ivy Extract Anaphylaxis   Poison Oak Extract Anaphylaxis   Flexeril [Cyclobenzaprine] Hives   Lactose Intolerance (Gi) Diarrhea   Honeysuckle Flower [Lonicera] Rash   Mixed Grasses Rash    Medications Reviewed Today     Reviewed by Greg Cutter, LCSW (Social Worker) on 11/07/21 at [phone]  Med List Status: <None>   Medication Order Taking? Sig Documenting Provider Last Dose Status Informant  azelastine (ASTELIN) 0.1 % nasal spray [id number] No 2 sprays per nostril 1-2 times daily as needed. [name], MD Taking Active   Cholecalciferol (VITAMIN D3) 25 MCG (1000 UT) CAPS [id number] No Take 1 capsule (1,000  Units total) by mouth daily. Kristin Rival, FNP Taking Active            Med Note [name]   Wed Oct 24, 2021  8:29 AM) 2,000 units daily  fluticasone (FLOVENT HFA) 110 MCG/ACT inhaler [id number] No Inhale 2 puffs into the lungs 2 (two) times daily. With spacer. [name], MD Taking Active   Galcanezumab-gnlm [facility]) 120 MG/ML [name] [id number] No Inject 1 pen into the skin every 28 (twenty-eight) days. Sater, Nanine Means, MD Taking Active   hydrochlorothiazide (HYDRODIURIL) 12.5 MG tablet [id number] No TAKE 1 TABLET(12.5 MG) BY MOUTH DAILY [name], [name], FNP Taking Active   ibuprofen (ADVIL) 800 MG tablet [id number] No Take 800 mg by mouth every 8 (eight) hours as needed for moderate pain. [provider] Taking Active   levocetirizine (XYZAL) 5 MG tablet [id number] No Take 1 tablet (5 mg total) by mouth in the morning and at bedtime. [name], MD Taking Expired 10/02/21 2359   levothyroxine (SYNTHROID) 112 MCG tablet [id number] No Take 1 tablet (112 mcg total) by mouth daily. Kristin Rival, FNP Taking Active   meloxicam (MOBIC) 15 MG tablet [id number] No Take 1 tablet (15 mg total) by mouth daily. [name], DPM Taking Active   rizatriptan (MAXALT-MLT) 10 MG disintegrating tablet [id number] No Take 1 tablet (10 mg total) by mouth as needed for migraine. May repeat in 2 hours if needed.  No more than 2/day ot 10/month Britt Bottom, MD Taking Active   sertraline (ZOLOFT) 100 MG tablet [id number] No Take 1 tablet (100

## 2021-11-07 NOTE — Patient Instructions (Signed)
Visit Information  Kristin Baker was given information about Medicaid Managed Care team care coordination services as a part of their [place] Medicaid benefit. Kristin Baker verbally consented to engagement with the [facility] Managed Care team.   If you are experiencing a medical emergency, please call 911 or report to your local emergency department or urgent care.   If you have a non-emergency medical problem during routine business hours, please contact your provider's office and ask to speak with a nurse.   For questions related to your [facility], please call: [phone] or visit the homepage here: [email or web address]  If you would like to schedule transportation through your [facility], please call the following number at least 2 days in advance of your appointment: [phone].  Rides for urgent appointments can also be made after hours by calling Member Services.  Call the [facility] at [phone], at any time, 24 hours a day, 7 days a week. If you are in danger or need immediate medical attention call 911.  If you would like help to quit smoking, call 1-800-QUIT-NOW (229)347-4872) OR Espaol: 1-855-Djelo-Ya (9-622-297-9892) o para ms informacin haga clic aqu or Text READY to 200-400 to register via text  Following is a copy of your plan of care:  Care Plan : LCSW Plan of Care  Updates made by Greg Cutter, LCSW since 11/07/2021 12:00 AM     Problem: Symptoms (Depression, Anxiety, ADHD)   Priority: High     Long-Range Goal: Symptoms Monitored and Managed   Start Date: 07/03/2021  Expected End Date: 10/31/2021  Recent Progress: On track  Priority: High  Note:   Current Barriers:  Mental Health Concerns  and Lacks knowledge of community resource: For therapy and psychiatry    CSW Clinical Goal(s):  Patient  verbalize  understanding of plan for management of Anxiety, Depression, and ADHD  through collaboration with Clinical Social Worker, provider, and care team.   Timeframe:  Long-Range Goal Priority:  High Start Date:    07/03/21             Expected End Date:  11/07/21             Follow Up Date -None. Goal has been met and closed as of 11/07/21   - begin personal counseling - call and visit an old friend - check out volunteer opportunities - join a support group - laugh; watch a funny movie or comedian - learn and use visualization or guided imagery - perform a random act of kindness - practice relaxation or meditation daily - start or continue a personal journal - talk about feelings with a friend, family or spiritual advisor - practice positive thinking and self-talk    Why is this important?   When you are stressed, down or upset, your body reacts too.  For example, your blood pressure may get higher; you may have a headache or stomachache.  When your emotions get the best of you, your body's ability to fight off cold and flu gets weak.  These steps will help you manage your emotions.     Current Barriers:  Limited social support, Mental Health Concerns , Social Isolation, and Lacks knowledge of community resource: available mental health resources  Lacks knowledge of how to connect  Unable to locate a provider that will take insurance   CSW Clinical Goal(s):  Patient  will demonstrate a reduction in symptoms related to :Anxiety with  Excessive Worry,, Depression: depressed mood, Anxiety and ADHD Explore community resource options for unmet needs related to her mental health patient will work with SW to address concerns related to finding a therapist and psychiatrist  patient will demonstrate improved health management independence as evidenced by increasing self-care and social support network through collaboration with [facility] representative, provider, and care team.   Patient Self-Care  Activities: -continue with medications as prescribed -call insurance provider # and inquire about in-network options for Psychiatry, Optometry and attention specialist -plan to go in for an intake at [facility] --review material emailed to you on 08/09/21 - keep 90 percent of counseling appointments  -call 911 or [phone] as discussed for emergencies/support  [name], BSW, MSW, [employer] Managed Medicaid LCSW [email or web address] Phone: [phone]

## 2021-11-09 ENCOUNTER — Other Ambulatory Visit: Payer: Self-pay | Admitting: Nurse Practitioner

## 2021-11-09 DIAGNOSIS — E039 Hypothyroidism, unspecified: Secondary | ICD-10-CM

## 2021-11-09 DIAGNOSIS — R5383 Other fatigue: Secondary | ICD-10-CM

## 2021-11-13 ENCOUNTER — Ambulatory Visit: Payer: Medicaid Other | Admitting: Podiatry

## 2021-11-26 ENCOUNTER — Ambulatory Visit: Payer: Medicaid Other | Admitting: Neurology

## 2021-11-29 ENCOUNTER — Ambulatory Visit: Payer: Medicaid Other | Admitting: Neurology

## 2021-11-29 ENCOUNTER — Other Ambulatory Visit: Payer: Self-pay

## 2021-11-29 NOTE — Patient Instructions (Signed)
Visit Information  Kristin Baker was given information about Medicaid Managed Care team care coordination services as a part of their [place] Medicaid benefit. Kristin Baker verbally consented to engagement with the [facility] Managed Care team.   If you are experiencing a medical emergency, please call 911 or report to your local emergency department or urgent care.   If you have a non-emergency medical problem during routine business hours, please contact your provider's office and ask to speak with a nurse.   For questions related to your [facility], please call: [phone] or visit the homepage here: [email or web address]  If you would like to schedule transportation through your [facility], please call the following number at least 2 days in advance of your appointment: [phone]   Rides for urgent appointments can also be made after hours by calling Member Services.  Call the [place] at [phone], at any time, 24 hours a day, 7 days a week. If you are in danger or need immediate medical attention call 911.  If you would like help to quit smoking, call 1-800-QUIT-NOW 210 107 0937) OR Espaol: 1-855-Djelo-Ya (2-423-536-1443) o para ms informacin haga clic aqu or Text READY to 200-400 to register via text  Kristin Baker - following are the goals we discussed in your visit today:   Goals Addressed   None      The  Patient                                              has been provided with contact information for the Managed Medicaid care management team and has been advised to call with any health related questions or concerns.   Kristin Baker, BSW, [place]  High Risk Managed Medicaid Team  [phone]   Following is a copy of your plan of care:  Care Plan : LCSW Plan of Care   Updates made by Kristin Baker since 11/29/2021 12:00 AM     Problem: Symptoms (Depression, Anxiety, ADHD)   Priority: High     Long-Range Goal: Symptoms Monitored and Managed   Start Date: 07/03/2021  Expected End Date: 10/31/2021  Recent Progress: On track  Priority: High  Note:   Current Barriers:  Mental Health Concerns  and Lacks knowledge of community resource: For therapy and psychiatry    CSW Clinical Goal(s):  Patient  verbalize understanding of plan for management of Anxiety, Depression, and ADHD  through collaboration with Clinical Social Worker, provider, and care team.   Timeframe:  Long-Range Goal Priority:  High Start Date:    07/03/21             Expected End Date:  11/07/21             Follow Up Date -None. Goal has been met and closed as of 11/07/21   - begin personal counseling - call and visit an old friend - check out volunteer opportunities - join a support group - laugh; watch a funny movie or comedian - learn and use visualization or guided imagery - perform a random act of kindness - practice relaxation or meditation daily - start or continue a personal journal - talk about feelings with a friend, family or spiritual advisor - practice positive thinking and self-talk    Why  is this important?   When you are stressed, down or upset, your body reacts too.  For example, your blood pressure may get higher; you may have a headache or stomachache.  When your emotions get the best of you, your body's ability to fight off cold and flu gets weak.  These steps will help you manage your emotions.     Current Barriers:  Limited social support, Mental Health Concerns , Social Isolation, and Lacks knowledge of community resource: available mental health resources  Lacks knowledge of how to connect  Unable to locate a provider that will take insurance   CSW Clinical Goal(s):  Patient  will demonstrate a reduction in symptoms related to :Anxiety with Excessive  Worry,, Depression: depressed mood, Anxiety and ADHD Explore community resource options for unmet needs related to her mental health patient will work with SW to address concerns related to finding a therapist and psychiatrist  patient will demonstrate improved health management independence as evidenced by increasing self-care and social support network through collaboration with Clinical Social Worker, provider, and care team.   Interventions:   08/24/21- Patient has not heard back from [facility] yet and was encouraged to contact facility today to inquire about her referrals. [facility] LCSW will send message to their team as well. Patient is interested in holistic options for her chronic pain and wishes to gain [facility] RNCM assistance for care coordination with acupuncture. Patient also wants assistance with finding an eye doctor. Patient was advised to contact her insurance. [facility] LCSW placed referral. Patient will be contacted on 08/27/21 by [place]. Patient was sent another mental health provider list on 08/24/21 and successfully received the last resource list sent by [facility] LCSW. Patient wishes to increase her SSRI to 100 mg. [facility] LCSW will send message to PCP with this request.  08/07/21- CSW spoke with pt who reports she was not able to be seen at [facility] due to out of network for her insurance. CSW suggested pt call the Customer Service # on her insurance card to seek options- CSW will also research options with colleague who is most familiar with Managed Medicaid. Similarly, pt reports she has not been able to find a Psychiatrist or an eye doctor that takes her insurance.  CSW completed a depression screening with pt; pt scoring "21"  which is much higher than last month ("8"). Pt denies SI/HI. She is in the midst of moving to a new home (down the street) and feels she "hit a brick wall and don't know what to do".  Pt does not feel the RX is helping her depression and states it may be making it  worse. Will alert PCP to this.  Pt admits to feeling "consumed" and also states "I have hope".    08/09/21- Patient was referred to [facility] on 08/09/21 for both counseling and psychiatry. She is not suicidal but was encouraged to consdier [facility] walk in clinic for support if needed. Patient's main support network consist of only her husband. Patient reports that her mother has untreated bipolar and that she is a trigger to her mental health. Email sent to patient with available mental health resources within her area on 08/09/21. Patient confirmed that she received this email.   09/07/21- Patient was informed that [facility] declined her referral for psychiatry and counseling because she is not a [facility] resident. [facility] LCSW encouraged patient to go to [facility] to enroll in their program for services. Patient is agreeable to do  so. She reports that she cannot go until next week but will make this a priority. [facility] LCSW will make one last follow up call in 60 days to ensure she was set up with mental health services with [facility]. She was educated on their program and new patient intake process. Patient reports that she spoke to [place] regarding her chronic pain which has affected her mental health. Email sent to patient on 09/07/21 with [facility] information and she confirmed while on the phone that she successfully received it.   10/12/21: BSW completed telephone outreach with patient regarding applying for disability. Patient stated she has never applied and does not know anyone that has applied and did not know what the first steps were. BSW provided patient with the website for [facility] and informed she could apply online or in person. No other resources are needed at this time.   11/07/21- [place] LCSW completed case closure call to patient and was able to successfully reach her. Patient has NOT went to [facility] to enroll into their program. Patient reports that her mental health has improved since she relocated. She  reports that she also got a new puppy which has helped to improve her mood and get her outside. Patient was encouraged to go to [facility] to seek treatment in order to maintain her current good mental health. Patient went to her eye doctor and had to be referred to another eye specialist. Patient has no further clinical social work needs at this time and is agreeable to social work case closure. [facility] LCSW will be available and can get back involved   11/29/21: BSW completed a telephone outreach with patient, she stated she is in the process of gathering information needed for [facility]. No other resources are needed at this time.  Depression screen [facility] 2/9 08/07/2021 07/03/2021 06/25/2021 05/14/2021 09/05/2020  Decreased Interest _0 0  Down, Depressed, Hopeless 3 0 0 3 0  PHQ - 2 Score _1 0  Altered sleeping _2 Tired, decreased energy _3 Change in appetite 3 0 0 3 1  Feeling bad or failure about yourself  1 0 0 3 0  Trouble concentrating _4 Moving slowly or fidgety/restless 3 0 0 3 1  Suicidal thoughts 0 0 0 0 0  PHQ-9 Score _5 Difficult doing work/chores Somewhat difficult Somewhat difficult Somewhat difficult Very difficult -    PAST [facility] BSW UPDATE: 07/24/21- CSW spoke with pt who reports having some consistent "dips" in her depression state; wants to ask PCP about increasing her dose. Validated her inquiry and plans to email PCP.  Pt denies SI/HI-  She is on the waiting list for an appointment with [facility] for counseling support- advised her to expect a call from them to schedule.  Pt inquiring about getting a vision evaluation by an Eye MD that takes Medicaid due to concern for glaucoma- stating her sister has recently been diagnosed with early onset glaucoma at age of 32yo.  Will pursue resources for vision care and advise pt of this asap.  1:1 collaboration with primary care provider regarding development and update of comprehensive plan of care as  evidenced by provider attestation and co-signature Inter-disciplinary care team collaboration (see longitudinal plan of care) Evaluation of current treatment plan related to  self management and patient's adherence to plan as established by  provider Review resources, discussed options and provided patient information about  Advance Directive Education (discussed and encouraged pt to consider completion)  Patient Self-Care Activities: -continue with medications as prescribed -call insurance provider # and inquire about in-network options for Psychiatry, Optometry and attention specialist -plan to go in for an intake at [facility] --review material emailed to you on 08/09/21 - keep 90 percent of counseling appointments  -call [phone] or [phone] as discussed for emergencies/support

## 2021-11-29 NOTE — Patient Outreach (Signed)
Medicaid Managed Care Social Work Note  11/29/2021 Name:  Kristin Baker MRN:  [id number] DOB:  07/21/1990  Kristin Baker is an 31 y.o. year old female who is a primary patient of [name], FNP.  The [facility] Managed Care Coordination team was consulted for assistance with:   SSA assistance   [name] was given information about Medicaid Managed Care Coordination team services today. Kristin Baker Patient agreed to services and verbal consent obtained.  Engaged with patient  for by telephone forfollow up visit in response to referral for case management and/or care coordination services.   Assessments/Interventions:  Review of past medical history, allergies, medications, health status, including review of consultants reports, laboratory and other test data, was performed as part of comprehensive evaluation and provision of chronic care management services.  SDOH: (Social Determinant of Health) assessments and interventions performed: BSW completed a telephone outreach with patient, she stated she is in the process of gathering information needed for SSA. No other resources are needed at this time.  Advanced Directives Status:  Not addressed in this encounter.  Care Plan                 Allergies  Allergen Reactions   Poison Ivy Extract Anaphylaxis   Poison Oak Extract Anaphylaxis   Flexeril [Cyclobenzaprine] Hives   Lactose Intolerance (Gi) Diarrhea   Honeysuckle Flower [Lonicera] Rash   Mixed Grasses Rash    Medications Reviewed Today     Reviewed by Greg Cutter, LCSW (Social Worker) on 11/07/21 at [phone]  Med List Status: <None>   Medication Order Taking? Sig Documenting Provider Last Dose Status Informant  azelastine (ASTELIN) 0.1 % nasal spray [id number] No 2 sprays per nostril 1-2 times daily as needed. [name], MD Taking Active   Cholecalciferol (VITAMIN D3) 25 MCG (1000 UT) CAPS [id number] No Take 1 capsule (1,000 Units total) by mouth  daily. [name], FNP Taking Active            Med Note [name]   Wed Oct 24, 2021  8:29 AM) 2,000 units daily  fluticasone (FLOVENT HFA) 110 MCG/ACT inhaler [id number] No Inhale 2 puffs into the lungs 2 (two) times daily. With spacer. [name], MD Taking Active   Galcanezumab-gnlm [facility]) 120 MG/ML Kristin Baker [id number] No Inject 1 pen into the skin every 28 (twenty-eight) days. Sater, Nanine Means, MD Taking Active   hydrochlorothiazide (HYDRODIURIL) 12.5 MG tablet [id number] No TAKE 1 TABLET(12.5 MG) BY MOUTH DAILY [name], [name], FNP Taking Active   ibuprofen (ADVIL) 800 MG tablet [id number] No Take 800 mg by mouth every 8 (eight) hours as needed for moderate pain. [provider] Taking Active   levocetirizine (XYZAL) 5 MG tablet [id number] No Take 1 tablet (5 mg total) by mouth in the morning and at bedtime. [name], MD Taking Expired 10/02/21 2359   levothyroxine (SYNTHROID) 112 MCG tablet [id number] No Take 1 tablet (112 mcg total) by mouth daily. [name], FNP Taking Active   meloxicam (MOBIC) 15 MG tablet [id number] No Take 1 tablet (15 mg total) by mouth daily. [name], DPM Taking Active   rizatriptan (MAXALT-MLT) 10 MG disintegrating tablet [id number] No Take 1 tablet (10 mg total) by mouth as needed for migraine. May repeat in 2 hours if needed.  No more than 2/day ot 10/month Sater, Nanine Means, MD Taking Active   sertraline (ZOLOFT) 100 MG tablet [id number] No Take 1 tablet (100 mg total) by  mouth daily. [name], FNP Taking Active   triamcinolone (NASACORT) 55 MCG/ACT AERO nasal inhaler [id number] No Place 1 spray into the nose daily. [name], MD Taking Active   VENTOLIN HFA [phone]) MCG/ACT inhaler [id number] No INHALE 2 PUFFS INTO THE LUNGS EVERY 4 HOURS AS NEEDED FOR WHEEZING OR SHORTNESS OF BREATH OR COUGH [name], MD Taking Active             Patient Active Problem List    Diagnosis Date Noted   Annual physical exam 10/24/2021   Fatigue 10/24/2021   Injury of foot, left, initial encounter 10/11/2021   Left shoulder pain 08/30/2021   Seasonal and perennial allergic rhinitis 07/18/2021   Hyperlipidemia 06/25/2021   H/O multiple allergies 05/14/2021   Anxiety and depression 05/14/2021   Morbid obesity ([place]) 05/14/2021   Hypertension 05/14/2021   Allergies 05/14/2021   Chronic bilateral low back pain without sciatica 05/14/2021   Vitamin D deficiency 01/15/2021   Chronic migraine w/o aura, not intractable, w/o stat migr 01/15/2021   Chronic daily headache 01/15/2021   White matter abnormality on MRI of brain 01/15/2021   Acquired hypothyroidism 07/10/2020   Essential hypertension 07/10/2020   DDD (degenerative disc disease), lumbar 01/11/2020    Conditions to be addressed/monitored per PCP order:   SSA assistance  Care Plan : LCSW Plan of Care  Updates made by Ethelda Chick since 11/29/2021 12:00 AM     Problem: Symptoms (Depression, Anxiety, ADHD)   Priority: High     Long-Range Goal: Symptoms Monitored and Managed   Start Date: 07/03/2021  Expected End Date: 10/31/2021  Recent Progress: On track  Priority: High  Note:   Current Barriers:  Mental Health Concerns  and Lacks knowledge of community resource: For therapy and psychiatry    CSW Clinical Goal(s):  Patient  verbalize understanding of plan for management of Anxiety, Depression, and ADHD  through collaboration with Clinical Social Worker, provider, and care team.   Timeframe:  Long-Range Goal Priority:  High Start Date:    07/03/21             Expected End Date:  11/07/21             Follow Up Date -None. Goal has been met and closed as of 11/07/21   - begin personal counseling - call and visit an old friend - check out volunteer opportunities - join a support group - laugh; watch a funny movie or comedian - learn and use visualization or guided imagery - perform a random act of  kindness - practice relaxation or meditation daily - start or continue a personal journal - talk about feelings with a friend, family or spiritual advisor - practice positive thinking and self-talk    Why is this important?   When you are stressed, down or upset, your body reacts too.  For example, your blood pressure may get higher; you may have a headache or stomachache.  When your emotions get the best of you, your body's ability to fight off cold and flu gets weak.  These steps will help you manage your emotions.     Current Barriers:  Limited social support, Mental Health Concerns , Social Isolation, and Lacks knowledge of community resource: available mental health resources  Lacks knowledge of how to connect  Unable to locate a provider that will take insurance   CSW Clinical Goal(s):  Patient  will demonstrate a reduction in symptoms related to :Anxiety  with Excessive Worry,, Depression: depressed mood, Anxiety and ADHD Explore community resource options for unmet needs related to her mental health patient will work with SW to address concerns related to finding a therapist and psychiatrist  patient will demonstrate improved health management independence as evidenced by increasing self-care and social support network through collaboration with Clinical Social Worker, provider, and care team.   Interventions:   08/24/21- Patient has not heard back from [facility] yet and was encouraged to contact facility today to inquire about her referrals. [facility] LCSW will send message to their team as well. Patient is interested in holistic options for her chronic pain and wishes to gain [facility] RNCM assistance for care coordination with acupuncture. Patient also wants assistance with finding an eye doctor. Patient was advised to contact her insurance. [facility] LCSW placed referral. Patient will be contacted on 08/27/21 by [place]. Patient was sent another mental health provider list on 08/24/21 and successfully  received the last resource list sent by [facility] LCSW. Patient wishes to increase her SSRI to 100 mg. [facility] LCSW will send message to PCP with this request.  08/07/21- CSW spoke with pt who reports she was not able to be seen at [facility] due to out of network for her insurance. CSW suggested pt call the Customer Service # on her insurance card to seek options- CSW will also research options with colleague who is most familiar with Managed Medicaid. Similarly, pt reports she has not been able to find a Psychiatrist or an eye doctor that takes her insurance.  CSW completed a depression screening with pt; pt scoring "21"  which is much higher than last month ("8"). Pt denies SI/HI. She is in the midst of moving to a new home (down the street) and feels she "hit a brick wall and don't know what to do".  Pt does not feel the RX is helping her depression and states it may be making it worse. Will alert PCP to this.  Pt admits to feeling "consumed" and also states "I have hope".    08/09/21- Patient was referred to [facility] on 08/09/21 for both counseling and psychiatry. She is not suicidal but was encouraged to consdier [facility] walk in clinic for support if needed. Patient's main support network consist of only her husband. Patient reports that her mother has untreated bipolar and that she is a trigger to her mental health. Email sent to patient with available mental health resources within her area on 08/09/21. Patient confirmed that she received this email.   09/07/21- Patient was informed that [facility] declined her referral for psychiatry and counseling because she is not a [facility] resident. [facility] LCSW encouraged patient to go to [facility] to enroll in their program for services. Patient is agreeable to do so. She reports that she cannot go until next week but will make this a priority. [facility] LCSW will make one last follow up call in 60 days to ensure she was set up with mental health services with [facility].  She was educated on their program and new patient intake process. Patient reports that she spoke to [place] regarding her chronic pain which has affected her mental health. Email sent to patient on 09/07/21 with [facility] information and she confirmed while on the phone that she successfully received it.   10/12/21: BSW completed telephone outreach with patient regarding applying for disability. Patient stated she has never applied and does not know anyone that has applied and did  not know what the first steps were. BSW provided patient with the website for [facility] and informed she could apply online or in person. No other resources are needed at this time.   11/07/21- [place] LCSW completed case closure call to patient and was able to successfully reach her. Patient has NOT went to [facility] to enroll into their program. Patient reports that her mental health has improved since she relocated. She reports that she also got a new puppy which has helped to improve her mood and get her outside. Patient was encouraged to go to [facility] to seek treatment in order to maintain her current good mental health. Patient went to her eye doctor and had to be referred to another eye specialist. Patient has no further clinical social work needs at this time and is agreeable to social work case closure. [facility] LCSW will be available and can get back involved   11/29/21: BSW completed a telephone outreach with patient, she stated she is in the process of gathering information needed for SSA. No other resources are needed at this time.  Depression screen [facility] 2/9 08/07/2021 07/03/2021 06/25/2021 05/14/2021 09/05/2020  Decreased Interest '3 1 1 3 ' 0  Down, Depressed, Hopeless 3 0 0 3 0  PHQ - 2 Score '6 1 1 6 ' 0  Altered sleeping '2 1 1 3 2  ' Tired, decreased energy '3 3 3 3 3  ' Change in appetite 3 0 0 3 1  Feeling bad or failure about yourself  1 0 0 3 0  Trouble concentrating '3 3 3 3 3  ' Moving slowly or fidgety/restless 3 0 0 3 1  Suicidal  thoughts 0 0 0 0 0  PHQ-9 Score '21 8 8 24 10  ' Difficult doing work/chores Somewhat difficult Somewhat difficult Somewhat difficult Very difficult -    PAST [facility] BSW UPDATE: 07/24/21- CSW spoke with pt who reports having some consistent "dips" in her depression state; wants to ask PCP about increasing her dose. Validated her inquiry and plans to email PCP.  Pt denies SI/HI-  She is on the waiting list for an appointment with [facility] for counseling support- advised her to expect a call from them to schedule.  Pt inquiring about getting a vision evaluation by an Eye MD that takes Medicaid due to concern for glaucoma- stating her sister has recently been diagnosed with early onset glaucoma at age of 32yo.  Will pursue resources for vision care and advise pt of this asap.  1:1 collaboration with primary care provider regarding development and update of comprehensive plan of care as evidenced by provider attestation and co-signature Inter-disciplinary care team collaboration (see longitudinal plan of care) Evaluation of current treatment plan related to  self management and patient's adherence to plan as established by provider Review resources, discussed options and provided patient information about  Advance Directive Education (discussed and encouraged pt to consider completion)  Patient Self-Care Activities: -continue with medications as prescribed -call insurance provider # and inquire about in-network options for Psychiatry, Optometry and attention specialist -plan to go in for an intake at [facility] --review material emailed to you on 08/09/21 - keep 90 percent of counseling appointments  -call [phone] or [phone] as discussed for emergencies/support       Follow up:  Patient agrees to Care Plan and Follow-up.  Plan: The  Patient has been provided with contact information for the Managed Medicaid care management team and has been advised to call with any health related  questions or  concerns.    Mickel Fuchs, BSW, [place] Managed Medicaid Team  [phone]

## 2021-11-30 ENCOUNTER — Other Ambulatory Visit: Payer: Self-pay | Admitting: Orthopedic Surgery

## 2021-11-30 DIAGNOSIS — G8929 Other chronic pain: Secondary | ICD-10-CM

## 2021-12-05 ENCOUNTER — Other Ambulatory Visit: Payer: Self-pay | Admitting: *Deleted

## 2021-12-05 NOTE — Patient Instructions (Signed)
Visit Information  [name]  - as a part of your Medicaid benefit, you are eligible for care management and care coordination services at no cost or copay. I was unable to reach you by phone today but would be happy to help you with your health related needs. Please feel free to call me @ [phone].   A member of the Managed Medicaid care management team will reach out to you again over the next 14 days.   Estanislado Emms RN, BSN [place]  [employer]

## 2021-12-05 NOTE — Patient Outreach (Signed)
  Medicaid Managed Care   Unsuccessful Attempt Note   12/05/2021 Name: Kristin Baker MRN: [id number] DOB: [birth year]  Referred by: [name], FNP Reason for referral : High Risk Managed Medicaid (Unsuccessful RNCM follow up telephone outreach)   An unsuccessful telephone outreach was attempted today. The patient was referred to the case management team for assistance with care management and care coordination.    Follow Up Plan: The Managed Medicaid care management team will reach out to the patient again over the next 14 days.    Estanislado Emms RN, BSN [place]  [employer]

## 2021-12-13 ENCOUNTER — Other Ambulatory Visit: Payer: Self-pay | Admitting: *Deleted

## 2021-12-13 NOTE — Patient Outreach (Signed)
Medicaid Managed Care   Nurse Care Manager Note  12/13/2021 Name:  Kristin Baker MRN:  [id number] DOB:  Sep 30, 1990  Kristin Baker is an 31 y.o. year old female who is a primary patient of [name], FNP.  [employer] Managed Care Coordination team was consulted for assistance with:    HTN pain  Ms. Brisbon was given information about Medicaid Managed Care Coordination team services today. Kristin Baker Patient agreed to services and verbal consent obtained.  Engaged with patient by telephone for follow up visit in response to provider referral for case management and/or care coordination services.   Assessments/Interventions:  Review of past medical history, allergies, medications, health status, including review of consultants reports, laboratory and other test data, was performed as part of comprehensive evaluation and provision of chronic care management services.  SDOH (Social Determinants of Health) assessments and interventions performed:   Care Plan  Allergies  Allergen Reactions   Poison Ivy Extract Anaphylaxis   Poison Oak Extract Anaphylaxis   Flexeril [Cyclobenzaprine] Hives   Lactose Intolerance (Gi) Diarrhea   Honeysuckle Flower [Lonicera] Rash   Mixed Grasses Rash    Medications Reviewed Today     Reviewed by Melissa Montane, RN (Registered Nurse) on 12/13/21 at 1120  Med List Status: <None>   Medication Order Taking? Sig Documenting Provider Last Dose Status Informant  azelastine (ASTELIN) 0.1 % nasal spray [id number] Yes 2 sprays per nostril 1-2 times daily as needed. [name], MD Taking Active   Cholecalciferol (VITAMIN D3) 25 MCG (1000 UT) CAPS [id number] Yes Take 1 capsule (1,000 Units total) by mouth daily. [name], FNP Taking Active            Med Note [name]   Wed Oct 24, 2021  8:29 AM) 2,000 units daily  fluticasone (FLOVENT HFA) 110 MCG/ACT inhaler [id number] Yes Inhale 2 puffs into the lungs 2 (two)  times daily. With spacer. [name], MD Taking Active   Galcanezumab-gnlm [facility]) 120 MG/ML [name] [id number] Yes Inject 1 pen into the skin every 28 (twenty-eight) days. Sater, Nanine Means, MD Taking Active   hydrochlorothiazide (HYDRODIURIL) 12.5 MG tablet [id number] Yes TAKE 1 TABLET(12.5 MG) BY MOUTH DAILY [name], [name], FNP Taking Active   ibuprofen (ADVIL) 800 MG tablet [id number] No Take 800 mg by mouth every 8 (eight) hours as needed for moderate pain.  Patient not taking: Reported on 12/13/2021   [provider] Not Taking Active   levocetirizine (XYZAL) 5 MG tablet [id number] Yes Take 1 tablet (5 mg total) by mouth in the morning and at bedtime. [name], MD Taking Active   levothyroxine (SYNTHROID) 112 MCG tablet [id number] Yes Take 1 tablet (112 mcg total) by mouth daily. [name], FNP Taking Active   meloxicam (MOBIC) 15 MG tablet [id number] Yes Take 1 tablet (15 mg total) by mouth daily. [name], DPM Taking Active   rizatriptan (MAXALT-MLT) 10 MG disintegrating tablet [id number] Yes Take 1 tablet (10 mg total) by mouth as needed for migraine. May repeat in 2 hours if needed.  No more than 2/day ot 10/month Sater, Nanine Means, MD Taking Active   sertraline (ZOLOFT) 100 MG tablet [id number] Yes Take 1 tablet (100 mg total) by mouth daily. [name], FNP Taking Active   tiZANidine (ZANAFLEX) 4 MG tablet [id number] Yes Take 4 mg by mouth every 6 (six) hours as needed for muscle spasms. [provider] Taking Active  Med Note (Koleton Duchemin A   Thu Dec 13, 2021 11:17 AM) Taking 2 tablets at bedtime  triamcinolone (NASACORT) 55 MCG/ACT AERO nasal inhaler [id number] Yes Place 1 spray into the nose daily. [name], MD Taking Active   VENTOLIN HFA [phone]) MCG/ACT inhaler [id number] Yes INHALE 2 PUFFS INTO THE LUNGS EVERY 4 HOURS AS NEEDED FOR WHEEZING OR SHORTNESS OF BREATH OR COUGH [name],  MD Taking Active             Patient Active Problem List   Diagnosis Date Noted   Annual physical exam 10/24/2021   Fatigue 10/24/2021   Injury of foot, left, initial encounter 10/11/2021   Left shoulder pain 08/30/2021   Seasonal and perennial allergic rhinitis 07/18/2021   Hyperlipidemia 06/25/2021   H/O multiple allergies 05/14/2021   Anxiety and depression 05/14/2021   Morbid obesity ([place]) 05/14/2021   Hypertension 05/14/2021   Allergies 05/14/2021   Chronic bilateral low back pain without sciatica 05/14/2021   Vitamin D deficiency 01/15/2021   Chronic migraine w/o aura, not intractable, w/o stat migr 01/15/2021   Chronic daily headache 01/15/2021   White matter abnormality on MRI of brain 01/15/2021   Acquired hypothyroidism 07/10/2020   Essential hypertension 07/10/2020   DDD (degenerative disc disease), lumbar 01/11/2020    Conditions to be addressed/monitored per PCP order:  HTN and pain  Care Plan : RN Care Manager Plan of Care  Updates made by Melissa Montane, RN since 12/13/2021 12:00 AM     Problem: Health Management needs related to Chronic Pain      Long-Range Goal: Development of Plan of Care to address Health Management needs related to Chronic Pain   Start Date: 08/31/2021  Expected End Date: 02/22/2022  Priority: High  Note:   Current Barriers:  Chronic Disease Management support and education needs related to Chronic Pain Ms. Flythe has upcoming CT guided injections scheduled in August. She reports being unable to walk for more than 3 minutes without stopping due to the pain. She has tried many techniques to manage her back and hip pain without relief. Patient reports recent BP 136/94 was due to stress and pain.   RNCM Clinical Goal(s):  Patient will verbalize understanding of plan for management of chronic back pain as evidenced by patient verbalization of self monitoring activities continue to work with RN Care Manager and/or Social Worker to  address care management and care coordination needs related to chronic back pain as evidenced by adherence to CM Team Scheduled appointments     through collaboration with Consulting civil engineer, provider, and care team.   Interventions: Inter-disciplinary care team collaboration (see longitudinal plan of care) Evaluation of current treatment plan related to  self management and patient's adherence to plan as established by provider Reviewed upcoming appointments: 7/24 with Nutrition, 8/2 for CT guided injection, 04/25/22 with PCP and rescheduling missed Podiatry and Asthma and Allergy appointment  Hypertension Interventions:  (Status:  New goal.) Long Term Goal Last practice recorded BP readings:  BP Readings from Last 3 Encounters:  10/24/21 (!) 136/94  10/11/21 122/80  08/30/21 139/89  Most recent eGFR/CrCl:  Lab Results  Component Value Date   EGFR 91 08/27/2021    No components found for: "CRCL"  Evaluation of current treatment plan related to hypertension self management and patient's adherence to plan as established by provider Reviewed medications with patient and discussed importance of compliance Counseled on the importance of exercise goals with target of  150 minutes per week Discussed plans with patient for ongoing care management follow up and provided patient with direct contact information for care management team Provided education on prescribed diet DASH Reviewed and discussed recent readings, patient reports readings tend to be elevated in the office or after riding with her mother Discussed obtaining BP monitor if readings continue to be greater than 130/80(RNCM can request order from PCP BP monitor) Discussed the affects of relaxation on BP, encouraged relaxation techniques   Pain:  (Status: Goal on Track (progressing): YES.) Long Term Goal  Pain assessment performed Medications reviewed, discussed new medications added for pain Discussed importance of adherence to all  scheduled medical appointments; Counseled on the importance of reporting any/all new or changed pain symptoms or management strategies to pain management provider; Discussed use of relaxation techniques and/or diversional activities to assist with pain reduction (distraction, imagery, relaxation, massage, acupressure, TENS, heat, and cold application; Reviewed with patient prescribed pharmacological and nonpharmacological pain relief strategies; Assessed social determinant of health barriers;  Discussed the benefits of exercise, encourages physical activity daily  Patient Goals/Self-Care Activities: Call provider office for new concerns or questions  Exercise daily, start with 10 minutes a day Eat foods to that help improve pain, avoid sugar Use heat/ice and relaxation to help improve pain       Follow Up:  Patient agrees to Care Plan and Follow-up.  Plan: The Managed Medicaid care management team will reach out to the patient again over the next 60 days.  Date/time of next scheduled RN care management/care coordination outreach:  02/11/22 @ [place] RN, [place] RN Care Coordinator

## 2021-12-13 NOTE — Patient Instructions (Signed)
Visit Information  Ms. Patteson was given information about Medicaid Managed Care team care coordination services as a part of their [place] Medicaid benefit. [name] verbally consented to engagement with the [facility] Managed Care team.   If you are experiencing a medical emergency, please call 911 or report to your local emergency department or urgent care.   If you have a non-emergency medical problem during routine business hours, please contact your provider's office and ask to speak with a nurse.   For questions related to your [facility], please call: [phone] or visit the homepage here: [email or web address]  If you would like to schedule transportation through your [facility], please call the following number at least 2 days in advance of your appointment: [phone]   Rides for urgent appointments can also be made after hours by calling Member Services.  Call the [place] at [phone], at any time, 24 hours a day, 7 days a week. If you are in danger or need immediate medical attention call 911.  If you would like help to quit smoking, call 1-800-QUIT-NOW 907-129-1511) OR Espaol: 1-855-Djelo-Ya (3-748-270-7867) o para ms informacin haga clic aqu or Text READY to 200-400 to register via text  Ms. Stancil,   Please see education materials related to managing stress provided by MyChart link.  Patient verbalizes understanding of instructions and care plan provided today and agrees to view in [place]. Active MyChart status and patient understanding of how to access instructions and care plan via MyChart confirmed with patient.     Telephone follow up appointment with Managed Medicaid care management team member scheduled for:02/11/22 @ [place] RN, [place] RN Care  Coordinator   Following is a copy of your plan of care:  Care Plan : RN Care Manager Plan of Care  Updates made by Melissa Montane, RN since 12/13/2021 12:00 AM     Problem: Health Management needs related to Chronic Pain      Long-Range Goal: Development of Plan of Care to address Health Management needs related to Chronic Pain   Start Date: 08/31/2021  Expected End Date: 02/22/2022  Priority: High  Note:   Current Barriers:  Chronic Disease Management support and education needs related to Chronic Pain Ms. Kanan has upcoming CT guided injections scheduled in August. She reports being unable to walk for more than 3 minutes without stopping due to the pain. She has tried many techniques to manage her back and hip pain without relief. Patient reports recent BP 136/94 was due to stress and pain.   RNCM Clinical Goal(s):  Patient will verbalize understanding of plan for management of chronic back pain as evidenced by patient verbalization of self monitoring activities continue to work with [place] and/or Social Worker to address care management and care coordination needs related to chronic back pain as evidenced by adherence to CM Team Scheduled appointments     through collaboration with Consulting civil engineer, provider, and care team.   Interventions: Inter-disciplinary care team collaboration (see longitudinal plan of care) Evaluation of current treatment plan related to  self management and patient's adherence to plan as established by provider Reviewed upcoming appointments: 7/24 with Nutrition, 8/2 for CT guided injection, 04/25/22 with PCP and rescheduling missed Podiatry and Asthma and Allergy appointment  Hypertension Interventions:  (Status:  New goal.) Long Term Goal Last practice recorded BP readings:  BP Readings  from Last 3 Encounters:  10/24/21 (!) 136/94  10/11/21 122/80  08/30/21 139/89  Most recent eGFR/CrCl:  Lab Results  Component Value Date   EGFR 91 08/27/2021     No components found for: "CRCL"  Evaluation of current treatment plan related to hypertension self management and patient's adherence to plan as established by provider Reviewed medications with patient and discussed importance of compliance Counseled on the importance of exercise goals with target of 150 minutes per week Discussed plans with patient for ongoing care management follow up and provided patient with direct contact information for care management team Provided education on prescribed diet DASH Reviewed and discussed recent readings, patient reports readings tend to be elevated in the office or after riding with her mother Discussed obtaining BP monitor if readings continue to be greater than 130/80(RNCM can request order from PCP BP monitor) Discussed the affects of relaxation on BP, encouraged relaxation techniques   Pain:  (Status: Goal on Track (progressing): YES.) Long Term Goal  Pain assessment performed Medications reviewed, discussed new medications added for pain Discussed importance of adherence to all scheduled medical appointments; Counseled on the importance of reporting any/all new or changed pain symptoms or management strategies to pain management provider; Discussed use of relaxation techniques and/or diversional activities to assist with pain reduction (distraction, imagery, relaxation, massage, acupressure, TENS, heat, and cold application; Reviewed with patient prescribed pharmacological and nonpharmacological pain relief strategies; Assessed social determinant of health barriers;  Discussed the benefits of exercise, encourages physical activity daily  Patient Goals/Self-Care Activities: Call provider office for new concerns or questions  Exercise daily, start with 10 minutes a day Eat foods to that help improve pain, avoid sugar Use heat/ice and relaxation to help improve pain

## 2021-12-14 ENCOUNTER — Other Ambulatory Visit: Payer: Self-pay | Admitting: Podiatry

## 2021-12-14 NOTE — Telephone Encounter (Signed)
Interaction between zoloft and meloxicam, please advise.

## 2021-12-17 ENCOUNTER — Encounter: Payer: Medicaid Other | Attending: Nurse Practitioner | Admitting: Nutrition

## 2021-12-17 ENCOUNTER — Encounter: Payer: Self-pay | Admitting: Nutrition

## 2021-12-17 VITALS — Ht 67.0 in | Wt 310.0 lb

## 2021-12-17 DIAGNOSIS — I10 Essential (primary) hypertension: Secondary | ICD-10-CM | POA: Insufficient documentation

## 2021-12-17 DIAGNOSIS — F32A Depression, unspecified: Secondary | ICD-10-CM | POA: Insufficient documentation

## 2021-12-17 DIAGNOSIS — Z713 Dietary counseling and surveillance: Secondary | ICD-10-CM | POA: Diagnosis not present

## 2021-12-17 DIAGNOSIS — E559 Vitamin D deficiency, unspecified: Secondary | ICD-10-CM | POA: Insufficient documentation

## 2021-12-17 DIAGNOSIS — Z6841 Body Mass Index (BMI) 40.0 and over, adult: Secondary | ICD-10-CM | POA: Diagnosis not present

## 2021-12-17 DIAGNOSIS — F419 Anxiety disorder, unspecified: Secondary | ICD-10-CM | POA: Insufficient documentation

## 2021-12-17 DIAGNOSIS — F509 Eating disorder, unspecified: Secondary | ICD-10-CM | POA: Diagnosis not present

## 2021-12-17 NOTE — Progress Notes (Signed)
Medical Nutrition Therapy:  Appt start time: 1600 end time: 1700  Assessment:  Primary concerns today: Morbid Obesity.  Follow up obesity: Gained 9 lbs. Feels like she is having a lot of issues with her binge eating disorder. Family history of ED in mother, grandmother and family members. Has tried to get in with therapist but hasn't been able to schedule appt. Called [facility] and got her an appt with a therapist for 01-03-22 Changes made:Has been more active since last visit. She moved and is renting a trailer with a few acres so she can walk and be more active. Activity is limited due to her back issues. Has severe back pain for 6 yrs.  Getting a CT biopys to SI injection site. Ortho MD- Dr. Yevette Edwards. She push mows her yard for activity and using lawnmower for support is ok. Can't walk far without support. Doesn't want the shame in using a walker or cane.  History of depression. Lost her dad 2 years ago and has had a lot of issues dealing with it. Gained about 60 lbs in the last year. Long family history of food issues, food insecurity, starvation and inconsistent access to food. Lived with her grandmother a lot as a child.   She use to be on depression and anxiety medications.  She has cut out a lot of diet sodas- only 1-2 per week. Use to eat plant based diet and lost weight in the past. Willing to work on lifestyle medicine to improve her health. Her daugter is here with her and she is obese also at 31 yrs old.    PMH: RA, HTN, Hypothyroidism, Hyperlipidemia, history of eating disorder-anorexia and bulemia and was in and out of inpatient facilities when she was younger. She reports her mom has an eating disorder also. Has seen therapist in past but not right now. Use to be on anxiety and depression meds but doesn't like the way they make her feel so she was slowly weaned off with her therapist when she was younger.  Not getting any physical activity right now.  Willing to try  and make some small changes in her lifestyle to provide needed weight loss.  Lab Results  Component Value Date   HGBA1C 5.5 06/19/2021      Latest Ref Rng & Units 08/27/2021    8:17 AM 05/14/2021   10:28 AM 10/04/2020   12:00 AM  CMP  Glucose 70 - 99 mg/dL 92  78  78   BUN 6 - 20 mg/dL 3  8  6    Creatinine 0.57 - 1.00 mg/dL  7.62  8.31   Sodium 134 - 144 mmol/L 139  136  139   Potassium 3.5 - 5.2 mmol/L 3.6  4.8  4.3   Chloride 96 - 106 mmol/L 103  102  103   CO2 20 - 29 mmol/L 23  22  27    Calcium 8.7 - 10.2 mg/dL 8.9  9.3  9.2   Total Protein 6.0 - 8.5 g/dL 6.9  7.2  7.1   Total Bilirubin 0.0 - 1.2 mg/dL 0.3  0.2  0.4   Alkaline Phos 44 - 121 IU/L 98  95    AST 0 - 40 IU/L 10  14  11    ALT 0 - 32 IU/L 12  14  11     Lipid Panel     Component Value Date/Time   CHOL 199 08/27/2021 0817   TRIG 126 08/27/2021 0817   HDL 35 (L)  08/27/2021 0817   CHOLHDL 5.7 (H) 08/27/2021 0817   CHOLHDL 7.0 (H) 01/11/2020 1154   LDLCALC 141 (H) 08/27/2021 0817   LDLCALC 166 (H) 01/11/2020 1154   LABVLDL 23 08/27/2021 0817    Preferred Learning Style:   No preference indicated   Learning Readiness:  Ready Change in progress   MEDICATIONS:    DIETARY INTAKE:  24-hr recall:  B ( 5-6 AM) Cherrios  1 cup with almond milk,  or skipped, water Lunch: quesdillas- cheese, water Dinner skipped , water   Usual physical activity: ADL  Estimated energy needs: 1400  calories 154 g carbohydrates 105 g protein 39 g fat  Progress Towards Goal(s):  Some progress.   Nutritional Diagnosis:  [place]-3.3 Overweight/obesity As related to inconsistent nutrient dense food intake.  As evidenced by BM 45 and diet recall.    Intervention:  Lifestyle Medicine  - Whole Food, Plant Predominant Nutrition is highly recommended: Eat Plenty of vegetables, Mushrooms, fruits, Legumes, Whole Grains, Nuts, seeds in lieu of processed meats, processed snacks/pastries red meat, poultry, eggs.    -It is  better to avoid simple carbohydrates including: Cakes, Sweet Desserts, Ice Cream, Soda (diet and regular), Sweet Tea, Candies, Chips, Cookies, Store Bought Juices, Alcohol in Excess of  1-2 drinks a day, Lemonade,  Artificial Sweeteners, Doughnuts, Coffee Creamers, "Sugar-free" Products, etc, etc.  This is not a complete list.....  Exercise: If you are able: 30 -60 minutes a day ,4 days a week, or 150 minutes a week.  The longer the better.  Combine stretch, strength, and aerobic activities.  If you were told in the past that you have high risk for cardiovascular diseases, you may seek evaluation by your heart doctor prior to initiating moderate to intense exercise programs.    Goals  Work with counselor on binge eating Try to keep a food journal  Walk using lawnmower for 15-30 minutes a day Increase plant based foods Lose 1 lb per week  Teaching Method Utilized:  Visual Auditory Hands on  Handouts given during visit include: Lifestyle Medicine   Barriers to learning/adherence to lifestyle change: may need to re establish mental health counseling to assist with her emotional issues.  Demonstrated degree of understanding via:  Teach Back   Monitoring/Evaluation:  Dietary intake, exercise, and body weight in 1 month(s). Recommend to refer back to seeing a therapist for her disordered eating and emotional issues.  Recommend to refer to an Endocrinologist for her hypothyroidism.  Recommend treatment for hyperlipidemia. Recommend to check Vit D levels if not alreay done.

## 2021-12-17 NOTE — Patient Instructions (Addendum)
Goal   Work with counselor on binge eating Try to keep a food journal  Walk using lawnmower for 15-30 minutes a day Increase plant based foods Lose 1 lb per week

## 2021-12-26 ENCOUNTER — Ambulatory Visit
Admission: RE | Admit: 2021-12-26 | Discharge: 2021-12-26 | Disposition: A | Discharge status: Home / Self Care | Payer: Medicaid Other | Source: Ambulatory Visit | Attending: Orthopedic Surgery | Admitting: Orthopedic Surgery

## 2021-12-26 DIAGNOSIS — M533 Sacrococcygeal disorders, not elsewhere classified: Secondary | ICD-10-CM | POA: Diagnosis not present

## 2021-12-26 DIAGNOSIS — M545 Low back pain, unspecified: Secondary | ICD-10-CM

## 2021-12-26 MED ORDER — METHYLPREDNISOLONE ACETATE 40 MG/ML INJ SUSP (RADIOLOG
80.0000 mg | Freq: Once | INTRAMUSCULAR | Status: AC
  Administered 2021-12-26: 80 mg via INTRA_ARTICULAR

## 2022-01-03 ENCOUNTER — Ambulatory Visit: Payer: Medicaid Other | Admitting: Podiatry

## 2022-01-08 ENCOUNTER — Ambulatory Visit: Payer: Medicaid Other | Admitting: Podiatry

## 2022-01-16 ENCOUNTER — Encounter: Payer: Medicaid Other | Attending: Nurse Practitioner | Admitting: Nutrition

## 2022-01-16 ENCOUNTER — Encounter: Payer: Self-pay | Admitting: Nutrition

## 2022-01-16 VITALS — Ht 67.0 in | Wt 300.0 lb

## 2022-01-16 DIAGNOSIS — F509 Eating disorder, unspecified: Secondary | ICD-10-CM | POA: Insufficient documentation

## 2022-01-16 DIAGNOSIS — F32A Depression, unspecified: Secondary | ICD-10-CM | POA: Insufficient documentation

## 2022-01-16 DIAGNOSIS — I10 Essential (primary) hypertension: Secondary | ICD-10-CM | POA: Diagnosis present

## 2022-01-16 DIAGNOSIS — E559 Vitamin D deficiency, unspecified: Secondary | ICD-10-CM | POA: Insufficient documentation

## 2022-01-16 DIAGNOSIS — F419 Anxiety disorder, unspecified: Secondary | ICD-10-CM | POA: Insufficient documentation

## 2022-01-16 DIAGNOSIS — E782 Mixed hyperlipidemia: Secondary | ICD-10-CM | POA: Insufficient documentation

## 2022-01-16 NOTE — Patient Instructions (Signed)
Goals  Increase plant based protein from beans, whole grains, nuts, seeds. Lose 1 lbs per week Make meals more balanced.

## 2022-01-16 NOTE — Progress Notes (Signed)
Medical Nutrition Therapy:  Appt start time: 1048 end time: 1105  Assessment:  Primary concerns today: Morbid Obesity.  Follow up obesity, hyperlipemia,  Lost 10 lbs.  Changes made: Doesn't eat out much. Choosing more vegan options. Cooking at home a lot. Avoiding gluten and dairy. Making more plant based foods. Being more mindful about food choices.  Just had a SI injection and seeing spine doctor. Limits her movement.  Has resistant bands some for exercise. Does some yoga.  Her food choices have improved. However, it's inconsistent to meet her nutritional needs. Needs better balanced meals.   Lab Results  Component Value Date   HGBA1C 5.5 06/19/2021      Latest Ref Rng & Units 08/27/2021    8:17 AM 05/14/2021   10:28 AM 10/04/2020   12:00 AM  CMP  Glucose 70 - 99 mg/dL 92  78  78   BUN 6 - 20 mg/dL 3  8  6    Creatinine 0.57 - 1.00 mg/dL  1.44  3.15   Sodium 134 - 144 mmol/L 139  136  139   Potassium 3.5 - 5.2 mmol/L 3.6  4.8  4.3   Chloride 96 - 106 mmol/L 103  102  103   CO2 20 - 29 mmol/L 23  22  27    Calcium 8.7 - 10.2 mg/dL 8.9  9.3  9.2   Total Protein 6.0 - 8.5 g/dL 6.9  7.2  7.1   Total Bilirubin 0.0 - 1.2 mg/dL 0.3  0.2  0.4   Alkaline Phos 44 - 121 IU/L 98  95    AST 0 - 40 IU/L 10  14  11    ALT 0 - 32 IU/L 12  14  11     Lipid Panel     Component Value Date/Time   CHOL 199 08/27/2021 0817   TRIG 126 08/27/2021 0817   HDL 35 (L) 08/27/2021 0817   CHOLHDL 5.7 (H) 08/27/2021 0817   CHOLHDL 7.0 (H) 01/11/2020 1154   LDLCALC 141 (H) 08/27/2021 0817   LDLCALC 166 (H) 01/11/2020 1154   LABVLDL 23 08/27/2021 0817    Preferred Learning Style:   No preference indicated   Learning Readiness:  Ready Change in progress   MEDICATIONS:    DIETARY INTAKE:  24-hr recall:  B ( 5-6 AM) Overnight oats with steel cut oats or cheerios with almond milk, water Lunch: Green beans and pb, water 01/13/2020 beans, veggie cookie water   Usual physical  activity: ADL  Estimated energy needs: 1400  calories 154 g carbohydrates 105 g protein 39 g fat  Progress Towards Goal(s):  Some progress.   Nutritional Diagnosis:  [place]-3.3 Overweight/obesity As related to inconsistent nutrient dense food intake.  As evidenced by BM 45 and diet recall.    Intervention:  Lifestyle Medicine  - Whole Food, Plant Predominant Nutrition is highly recommended: Eat Plenty of vegetables, Mushrooms, fruits, Legumes, Whole Grains, Nuts, seeds in lieu of processed meats, processed snacks/pastries red meat, poultry, eggs.    -It is better to avoid simple carbohydrates including: Cakes, Sweet Desserts, Ice Cream, Soda (diet and regular), Sweet Tea, Candies, Chips, Cookies, Store Bought Juices, Alcohol in Excess of  1-2 drinks a day, Lemonade,  Artificial Sweeteners, Doughnuts, Coffee Creamers, "Sugar-free" Products, etc, etc.  This is not a complete list.....  Exercise: If you are able: 30 -60 minutes a day ,4 days a week, or 150 minutes a week.  The longer the better.  Combine stretch,  strength, and aerobic activities.  If you were told in the past that you have high risk for cardiovascular diseases, you may seek evaluation by your heart doctor prior to initiating moderate to intense exercise programs.    Goals  Increase plant based protein from beans, whole grains, nuts, seeds. Lose 1 lbs per week Make meals more balanced.  Teaching Method Utilized:  Visual Auditory Hands on  Handouts given during visit include: Lifestyle Medicine   Barriers to learning/adherence to lifestyle change: may need to re establish mental health counseling to assist with her emotional issues.  Demonstrated degree of understanding via:  Teach Back   Monitoring/Evaluation:  Dietary intake, exercise, and body weight in 1 month(s). Recommend to refer back to seeing a therapist for her disordered eating and emotional issues.  Recommend to refer to an Endocrinologist for her  hypothyroidism.  Recommend treatment for hyperlipidemia. Recommend to check Vit D levels if not alreay done.

## 2022-01-17 ENCOUNTER — Ambulatory Visit: Payer: Medicaid Other | Admitting: Nutrition

## 2022-01-17 ENCOUNTER — Other Ambulatory Visit: Payer: Self-pay | Admitting: Neurology

## 2022-01-22 ENCOUNTER — Encounter: Payer: Self-pay | Admitting: Nutrition

## 2022-02-11 ENCOUNTER — Other Ambulatory Visit: Payer: Self-pay | Admitting: *Deleted

## 2022-02-11 NOTE — Patient Outreach (Signed)
Medicaid Managed Care   Nurse Care Manager Note  02/11/2022 Name:  Kristin Baker MRN:  [id number] DOB:  05/07/1991  Kristin Baker is an 31 y.o. year old female who is a primary patient of Kristin Rival, FNP.  The [facility] Managed Care Coordination team was consulted for assistance with:    HTN Pain  [name] was given information about Medicaid Managed Care Coordination team services today. [name] Patient agreed to services and verbal consent obtained.  Engaged with patient by telephone for follow up visit in response to provider referral for case management and/or care coordination services.   Assessments/Interventions:  Review of past medical history, allergies, medications, health status, including review of consultants reports, laboratory and other test data, was performed as part of comprehensive evaluation and provision of chronic care management services.  SDOH (Social Determinants of Health) assessments and interventions performed: SDOH Interventions    Flowsheet Row Patient Outreach Telephone from 02/11/2022 in [place] Coordination Nutrition from 12/17/2021 in [facility]- Patient Outreach Telephone from 11/07/2021 in [place] Patient Outreach Telephone from 10/02/2021 in [place] Patient Outreach Telephone from 09/07/2021 in [place] Patient Outreach Telephone from 08/31/2021 in [place] Interventions        Food Insecurity Interventions -- -- -- -- -- Intervention Not Indicated  Housing Interventions Intervention Not Indicated -- -- -- -- Intervention Not Indicated  Transportation Interventions Intervention Not Indicated -- -- Intervention Not Indicated -- --  Depression Interventions/Treatment  -- Referral to  Psychiatry -- -- -- --  Stress Interventions -- -- [employer], Live Life Well -- [employer], Provide Counseling --       Care Plan  Allergies  Allergen Reactions   Poison Ivy Extract Anaphylaxis   Poison Oak Extract Anaphylaxis   Flexeril [Cyclobenzaprine] Hives   Lactose Intolerance (Gi) Diarrhea   Honeysuckle Flower [Lonicera] Rash   Mixed Grasses Rash    Medications Reviewed Today     Reviewed by Melissa Montane, RN (Registered Nurse) on 02/11/22 at [phone]  Med List Status: <None>   Medication Order Taking? Sig Documenting Provider Last Dose Status Informant  azelastine (ASTELIN) 0.1 % nasal spray [id number] Yes 2 sprays per nostril 1-2 times daily as needed. [name], MD Taking Active   Cholecalciferol (VITAMIN D3) 25 MCG (1000 UT) CAPS [id number] Yes Take 1 capsule (1,000 Units total) by mouth daily. Kristin Rival, FNP Taking Active            Med Note [name]   Wed Oct 24, 2021  8:29 AM) 2,000 units daily  fluticasone (FLOVENT HFA) 110 MCG/ACT inhaler [id number] Yes Inhale 2 puffs into the lungs 2 (two) times daily. With spacer. [name], MD Taking Active   Galcanezumab-gnlm [facility]) 120 MG/ML [name] [id number] No Inject 1 pen into the skin every 28 (twenty-eight) days.  Patient not taking: Reported on 02/11/2022   Britt Bottom, MD Not Taking Active            Med Note (Anjelique Makar A   Mon Feb 11, 2022  9:12 AM) Medication on back order  hydrochlorothiazide (HYDRODIURIL) 12.5 MG tablet [id number] Yes TAKE 1 TABLET(12.5 MG) BY MOUTH DAILY [name], [name], FNP Taking Active   ibuprofen (ADVIL) 800 MG tablet [id number] Yes Take 800 mg by mouth every 8 (eight)  hours as needed for moderate pain. [provider] Taking Active   levocetirizine (XYZAL) 5 MG tablet [id number]  Take 1 tablet (5 mg total) by mouth in the morning and at bedtime. [name], MD  Expired  12/13/21 2359   levothyroxine (SYNTHROID) 112 MCG tablet [id number] Yes Take 1 tablet (112 mcg total) by mouth daily. Kristin Rival, FNP Taking Active   meloxicam (MOBIC) 15 MG tablet [id number] No Take 1 tablet (15 mg total) by mouth daily.  Patient not taking: Reported on 02/11/2022   [name], DPM Not Taking Active   rizatriptan (MAXALT-MLT) 10 MG disintegrating tablet [id number] Yes DISSOLVE ONE TABLET BY MOUTH AS NEEDED FOR MIGRAINE. MAY REPEAT IN 2 HOURS IF NEEDED. NO MORE THAN 2 TABLETS DAILY OR 10 TABLETS A MONTH. Sater, Nanine Means, MD Taking Active   sertraline (ZOLOFT) 100 MG tablet [id number] No Take 1 tablet (100 mg total) by mouth daily.  Patient not taking: Reported on 02/11/2022   Kristin Rival, FNP Not Taking Active            Med Note (Stephanie Littman A   Mon Feb 11, 2022  9:15 AM) Medication made patient feel sick  tiZANidine (ZANAFLEX) 4 MG tablet [id number] Yes Take 4 mg by mouth every 6 (six) hours as needed for muscle spasms. [provider] Taking Active            Med Note (Abril Cappiello A   Thu Dec 13, 2021 11:17 AM) Taking 2 tablets at bedtime  triamcinolone (NASACORT) 55 MCG/ACT AERO nasal inhaler [id number] Yes Place 1 spray into the nose daily. [name], MD Taking Active   VENTOLIN HFA 108 [phone]) MCG/ACT inhaler [id number] Yes INHALE 2 PUFFS INTO THE LUNGS EVERY 4 HOURS AS NEEDED FOR WHEEZING OR SHORTNESS OF BREATH OR COUGH [name], MD Taking Active             Patient Active Problem List   Diagnosis Date Noted   Annual physical exam 10/24/2021   Fatigue 10/24/2021   Injury of foot, left, initial encounter 10/11/2021   Left shoulder pain 08/30/2021   Seasonal and perennial allergic rhinitis 07/18/2021   Hyperlipidemia 06/25/2021   H/O multiple allergies 05/14/2021   Anxiety and depression 05/14/2021   Morbid obesity ([place]) 05/14/2021   Hypertension 05/14/2021   Allergies 05/14/2021   Chronic bilateral low  back pain without sciatica 05/14/2021   Vitamin D deficiency 01/15/2021   Chronic migraine w/o aura, not intractable, w/o stat migr 01/15/2021   Chronic daily headache 01/15/2021   White matter abnormality on MRI of brain 01/15/2021   Acquired hypothyroidism 07/10/2020   Essential hypertension 07/10/2020   DDD (degenerative disc disease), lumbar 01/11/2020    Conditions to be addressed/monitored per PCP order:  HTN and Pain  Care Plan : RN Care Manager Plan of Care  Updates made by Melissa Montane, RN since 02/11/2022 12:00 AM     Problem: Health Management needs related to Chronic Pain      Long-Range Goal: Development of Plan of Care to address Health Management needs related to Chronic Pain   Start Date: 08/31/2021  Expected End Date: 03/26/2022  Priority: High  Note:   Current Barriers:  Chronic Disease Management support and education needs related to Chronic Pain Ms. Hodgens had CT guided SI joint injections in August. Pain relief for 1-2 weeks. Referred to [facility] a candidate as she does not want to take  opioids. Discussed Radio Frequency Ablation with [place], needs to call for an appointment. She will need to choose a new PCP, her PCP has left the practice. Working with a Transport planner at [facility] and referred to Psychiatry. Seeing Nutrition and implementing a plant based diet to improve her health.   RNCM Clinical Goal(s):  Patient will verbalize understanding of plan for management of chronic back pain as evidenced by patient verbalization of self monitoring activities continue to work with [place] and/or Social Worker to address care management and care coordination needs related to chronic back pain as evidenced by adherence to CM Team Scheduled appointments     through collaboration with RN Care manager, provider, and care team.   Interventions: Inter-disciplinary care team collaboration (see longitudinal plan of  care) Evaluation of current treatment plan related to  self management and patient's adherence to plan as established by provider Reviewed upcoming appointments:  04/25/22 with PCP, 04/29/22 with Nutrition and 06/12/21 with Neurology  Hypertension Interventions:  (Status:  New goal.) Long Term Goal Last practice recorded BP readings: 01/24/22 150/92-Patient reports she forgot to take her BP medication before this appointment BP Readings from Last 3 Encounters:  10/24/21 (!) 136/94  10/11/21 122/80  08/30/21 139/89  Most recent eGFR/CrCl:  Lab Results  Component Value Date   EGFR 91 08/27/2021    No components found for: "CRCL"  Evaluation of current treatment plan related to hypertension self management and patient's adherence to plan as established by provider Reviewed medications with patient and discussed importance of compliance Counseled on the importance of exercise goals with target of 150 minutes per week Discussed plans with patient for ongoing care management follow up and provided patient with direct contact information for care management team Provided education on prescribed diet DASH Reviewed and discussed recent readings, patient reports readings tend to be elevated in the office or after riding with her mother Advised patient to call and establish care with new PCP Advised to take all medications as prescribed   Pain:  (Status: Goal on Track (progressing): YES.) Long Term Goal  Pain assessment performed Medications reviewed, discussed new medications added for pain Discussed importance of adherence to all scheduled medical appointments; Counseled on the importance of reporting any/all new or changed pain symptoms or management strategies to pain management provider; Discussed use of relaxation techniques and/or diversional activities to assist with pain reduction (distraction, imagery, relaxation, massage, acupressure, TENS, heat, and cold application; Reviewed with  patient prescribed pharmacological and nonpharmacological pain relief strategies; Assessed social determinant of health barriers;  Encouraged patient to call and schedule follow up with [place]  Patient Goals/Self-Care Activities: Call provider office for new concerns or questions  Exercise daily, start with 10 minutes a day Eat foods to that help improve pain, avoid sugar Use heat/ice and relaxation to help improve pain       Follow Up:  Patient agrees to Care Plan and Follow-up.  Plan: The Managed Medicaid care management team will reach out to the patient again over the next 60 days.  Date/time of next scheduled RN care management/care coordination outreach:  04/12/22 @ [place] RN, [place] RN Care Coordinator

## 2022-02-11 NOTE — Patient Instructions (Signed)
Visit Information  Kristin Baker was given information about Medicaid Managed Care team care coordination services as a part of their [place] Medicaid benefit. Kristin Baker verbally consented to engagement with the [facility] Managed Care team.   If you are experiencing a medical emergency, please call 911 or report to your local emergency department or urgent care.   If you have a non-emergency medical problem during routine business hours, please contact your provider's office and ask to speak with a nurse.   For questions related to your [facility], please call: [phone] or visit the homepage here: [email or web address]  If you would like to schedule transportation through your [facility], please call the following number at least 2 days in advance of your appointment: [phone]   Rides for urgent appointments can also be made after hours by calling Member Services.  Call the [place] at [phone], at any time, 24 hours a day, 7 days a week. If you are in danger or need immediate medical attention call 911.  If you would like help to quit smoking, call 1-800-QUIT-NOW 229-588-1502) OR Espaol: 1-855-Djelo-Ya (0-932-355-7322) o para ms informacin haga clic aqu or Text READY to 200-400 to register via text  Kristin Baker,   Please see education materials related to HTN provided by MyChart link.  Patient verbalizes understanding of instructions and care plan provided today and agrees to view in [place]. Active MyChart status and patient understanding of how to access instructions and care plan via MyChart confirmed with patient.     Telephone follow up appointment with Managed Medicaid care management team member scheduled for:04/12/22 @ [place] RN, [place] RN Care  Coordinator   Following is a copy of your plan of care:  Care Plan : RN Care Manager Plan of Care  Updates made by Melissa Montane, RN since 02/11/2022 12:00 AM     Problem: Health Management needs related to Chronic Pain      Long-Range Goal: Development of Plan of Care to address Health Management needs related to Chronic Pain   Start Date: 08/31/2021  Expected End Date: 03/26/2022  Priority: High  Note:   Current Barriers:  Chronic Disease Management support and education needs related to Chronic Pain Kristin Baker had CT guided SI joint injections in August. Pain relief for 1-2 weeks. Referred to [facility] a candidate as she does not want to take opioids. Discussed Radio Frequency Ablation with [place], needs to call for an appointment. She will need to choose a new PCP, her PCP has left the practice. Working with a Transport planner at [facility] and referred to Psychiatry. Seeing Nutrition and implementing a plant based diet to improve her health.   RNCM Clinical Goal(s):  Patient will verbalize understanding of plan for management of chronic back pain as evidenced by patient verbalization of self monitoring activities continue to work with [place] and/or Social Worker to address care management and care coordination needs related to chronic back pain as evidenced by adherence to CM Team Scheduled appointments     through collaboration with RN Care manager, provider, and care team.   Interventions: Inter-disciplinary care team collaboration (see longitudinal plan of care) Evaluation of current treatment plan related to  self management and patient's adherence to plan as established by provider Reviewed upcoming appointments:  04/25/22 with PCP, 04/29/22 with Nutrition and 06/12/21 with  Neurology  Hypertension Interventions:  (Status:  New goal.) Long Term Goal Last practice recorded BP readings: 01/24/22 150/92-Patient reports she forgot to  take her BP medication before this appointment BP Readings from Last 3 Encounters:  10/24/21 (!) 136/94  10/11/21 122/80  08/30/21 139/89  Most recent eGFR/CrCl:  Lab Results  Component Value Date   EGFR 91 08/27/2021    No components found for: "CRCL"  Evaluation of current treatment plan related to hypertension self management and patient's adherence to plan as established by provider Reviewed medications with patient and discussed importance of compliance Counseled on the importance of exercise goals with target of 150 minutes per week Discussed plans with patient for ongoing care management follow up and provided patient with direct contact information for care management team Provided education on prescribed diet DASH Reviewed and discussed recent readings, patient reports readings tend to be elevated in the office or after riding with her mother Advised patient to call and establish care with new PCP Advised to take all medications as prescribed   Pain:  (Status: Goal on Track (progressing): YES.) Long Term Goal  Pain assessment performed Medications reviewed, discussed new medications added for pain Discussed importance of adherence to all scheduled medical appointments; Counseled on the importance of reporting any/all new or changed pain symptoms or management strategies to pain management provider; Discussed use of relaxation techniques and/or diversional activities to assist with pain reduction (distraction, imagery, relaxation, massage, acupressure, TENS, heat, and cold application; Reviewed with patient prescribed pharmacological and nonpharmacological pain relief strategies; Assessed social determinant of health barriers;  Encouraged patient to call and schedule follow up with [place]  Patient Goals/Self-Care Activities: Call provider office for new concerns or questions  Exercise daily, start with 10 minutes a day Eat foods to that help improve pain,  avoid sugar Use heat/ice and relaxation to help improve pain

## 2022-03-13 ENCOUNTER — Encounter: Payer: Self-pay | Admitting: Family Medicine

## 2022-03-13 ENCOUNTER — Ambulatory Visit (INDEPENDENT_AMBULATORY_CARE_PROVIDER_SITE_OTHER): Payer: Medicaid Other | Admitting: Family Medicine

## 2022-03-13 VITALS — BP 126/82 | HR 113 | Ht 67.0 in | Wt 307.0 lb

## 2022-03-13 DIAGNOSIS — Z2821 Immunization not carried out because of patient refusal: Secondary | ICD-10-CM

## 2022-03-13 DIAGNOSIS — M792 Neuralgia and neuritis, unspecified: Secondary | ICD-10-CM | POA: Diagnosis not present

## 2022-03-13 DIAGNOSIS — M545 Low back pain, unspecified: Secondary | ICD-10-CM | POA: Diagnosis not present

## 2022-03-13 DIAGNOSIS — G8929 Other chronic pain: Secondary | ICD-10-CM | POA: Diagnosis not present

## 2022-03-13 NOTE — Patient Instructions (Addendum)
I appreciate the opportunity to provide care to you today!    Follow up:  04/25/22  Labs: please stop by the lab today to get your blood drawn (B12 and B1)  Please continue taking your prescribed medication as needed for nerve pain  Please follow-up orthopedics if symptoms reoccur     Please continue to a heart-healthy diet and increase your physical activities. Try to exercise for 13mins at least three times a week.      It was a pleasure to see you and I look forward to continuing to work together on your health and well-being. Please do not hesitate to call the office if you need care or have questions about your care.   Have a wonderful day and week. With Gratitude, [name] MSN, FNP-BC

## 2022-03-13 NOTE — Assessment & Plan Note (Addendum)
Will check the patient vitamin B-12 and B1 today, as deficiency in both vitamins can cause shooting nerve pain throughout the body Informed the patient to follow up with her orthopedics if her symptoms reoccur or worsen Encouraged to continue taking her Zanaflex 4 mg as needed along with her Motrin as needed for back pain

## 2022-03-13 NOTE — Progress Notes (Signed)
Acute Office Visit  Subjective:     Patient ID: Kristin Baker, female    DOB: 05/25/1991, 31 y.o.   MRN: [id number]  Chief Complaint  Patient presents with   Chest Pain    Pt reports pain in her lungs/ribs that caused her to be sob and feel some chest pain for about 6 minutes, all this happened yesterday, still some mild back pain.     Chest Pain  Pertinent negatives include no cough, dizziness, fever or shortness of breath.   Patient is in today with complaints of shooting nerve pain from her cervical spine to her mid-back.  Symptom lasted between 6 to 10 minutes.  Pain was rated 10 out of 10 with increased SOB. onset of symptoms on 03/12/2022.  She reports a history of chronic nerve pain since 2017.  She reports following up with [facility] for the management of her nerve pain.  She reported that her symptoms have resolved since its onset last night.  She declined nerve pain in the back today.  Review of Systems  Constitutional:  Negative for chills and fever.  Respiratory:  Negative for cough and shortness of breath.   Cardiovascular:  Negative for chest pain.  Musculoskeletal:  Negative for joint pain and myalgias.  Skin:  Negative for itching.  Neurological:  Negative for dizziness.        Objective:    BP 126/82   Pulse (!) 113   Ht 5\' 7"  (1.702 m)   Wt (!) 307 lb 0.3 oz (139.3 kg)   SpO2 98%   BMI 48.09 kg/m    Physical Exam HENT:     Head: Normocephalic.     Mouth/Throat:     Mouth: Mucous membranes are moist.  Cardiovascular:     Rate and Rhythm: Normal rate and regular rhythm.     Pulses: Normal pulses.     Heart sounds: Normal heart sounds.  Pulmonary:     Effort: Pulmonary effort is normal.     Breath sounds: Normal breath sounds.  Musculoskeletal:        General: No swelling, tenderness, deformity or signs of injury.     Cervical back: No swelling, deformity or tenderness.     Thoracic back: No swelling, deformity or tenderness.      Lumbar back: No swelling, deformity or tenderness.     Right lower leg: No edema.  Neurological:     Mental Status: She is alert.     No results found for any visits on 03/13/22.      Assessment & Plan:   Problem List Items Addressed This Visit       Other   Chronic bilateral low back pain without sciatica    Will check the patient vitamin B-12 and B1 today, as deficiency in both vitamins can cause shooting nerve pain throughout the body Informed the patient to follow up with her orthopedics if her symptoms reoccur or worsen Encouraged to continue taking her Zanaflex 4 mg as needed along with her Motrin as needed for back pain       Other Visit Diagnoses     Nerve pain    -  Primary   Relevant Orders   B12   Vitamin B1   Refused influenza vaccine       Relevant Orders   Vitamin B1       No orders of the defined types were placed in this encounter.   Return if symptoms worsen or fail to  improve.  [name], FNP

## 2022-03-17 LAB — VITAMIN B1: Thiamine: 118.9 nmol/L (ref 66.5–200.0)

## 2022-03-17 LAB — VITAMIN B12: Vitamin B-12: 345 pg/mL (ref 232–1245)

## 2022-03-18 NOTE — Progress Notes (Signed)
Please inform the patient that her vitamin B12 and B1 are within normal limits.

## 2022-04-06 DIAGNOSIS — R079 Chest pain, unspecified: Secondary | ICD-10-CM | POA: Diagnosis not present

## 2022-04-06 DIAGNOSIS — R52 Pain, unspecified: Secondary | ICD-10-CM | POA: Diagnosis not present

## 2022-04-06 DIAGNOSIS — I959 Hypotension, unspecified: Secondary | ICD-10-CM | POA: Diagnosis not present

## 2022-04-06 DIAGNOSIS — T68XXXA Hypothermia, initial encounter: Secondary | ICD-10-CM | POA: Diagnosis not present

## 2022-04-09 ENCOUNTER — Other Ambulatory Visit: Payer: Self-pay | Admitting: Nurse Practitioner

## 2022-04-09 DIAGNOSIS — I10 Essential (primary) hypertension: Secondary | ICD-10-CM

## 2022-04-11 ENCOUNTER — Ambulatory Visit (INDEPENDENT_AMBULATORY_CARE_PROVIDER_SITE_OTHER): Payer: Medicaid Other | Admitting: Adult Health

## 2022-04-11 ENCOUNTER — Encounter: Payer: Self-pay | Admitting: Adult Health

## 2022-04-11 VITALS — BP 140/85 | HR 99 | Ht 67.0 in | Wt 300.2 lb

## 2022-04-11 DIAGNOSIS — N926 Irregular menstruation, unspecified: Secondary | ICD-10-CM

## 2022-04-11 DIAGNOSIS — R102 Pelvic and perineal pain: Secondary | ICD-10-CM | POA: Diagnosis not present

## 2022-04-11 DIAGNOSIS — N921 Excessive and frequent menstruation with irregular cycle: Secondary | ICD-10-CM | POA: Insufficient documentation

## 2022-04-11 NOTE — Progress Notes (Signed)
Subjective:     Patient ID: Kristin Baker, female   DOB: April 25, 1991, 31 y.o.   MRN: [id number]  HPI Kristin Baker is a 31 year old white female, married, G2P1011, in complaining of heavy bleeding for about a month and irregular periods. Has to change pad every 1-2 hours ans is dizzy and fatigued. She has sharp pain in pelvic area on and off. She had IUD removed 09/05/20, had period after that then none, til this September and spotted then spotted first of October and then since about 03/10/22 heavy bleeding.  Last pap was 09/05/20, negative HPV and malignancy.  PCP Kristin Laroche, NP  Review of Systems +heavy bleeding for about a month and irregular periods. Has to change pad every 1-2 hours ans is dizzy and fatigued. She has sharp pain in pelvic area on and off. She had IUD removed 09/05/20, had period after that then none, til this September and spotted then spotted first of October and then since about 03/10/22 heavy bleeding.   Reviewed past medical,surgical, social and family history. Reviewed medications and allergies.  Objective:   Physical Exam BP (!) 140/85 (BP Location: Right Arm, Patient Position: Sitting, Cuff Size: Normal)   Pulse 99   Ht 5\' 7"  (1.702 m)   Wt (!) 300 lb 4 oz (136.2 kg)   LMP 03/10/2022 (Approximate)   BMI 47.03 kg/m  she could not void Skin warm and dry.  Lungs: clear to ausculation bilaterally. Cardiovascular: regular rate and rhythm.    She declines exam Fall risk is low    04/11/2022    2:15 PM 03/13/2022   10:35 AM 12/17/2021    5:21 PM  Depression screen PHQ 2/9  Decreased Interest 0 0 3  Down, Depressed, Hopeless 0 0 3  PHQ - 2 Score 0 0 6  Tired, decreased energy   3  Change in appetite   3  Feeling bad or failure about yourself    2  Trouble concentrating   3  Moving slowly or fidgety/restless   0  Suicidal thoughts   0  Difficult doing work/chores   Extremely dIfficult     Upstream - 04/11/22 1416       Pregnancy Intention Screening    Does the patient want to become pregnant in the next year? Yes    Does the patient's partner want to become pregnant in the next year? Yes    Would the patient like to discuss contraceptive options today? Yes      Contraception Wrap Up   Current Method No Method - Other Reason    End Method No Method - Other Reason   may want IUD later   Contraception Counseling Provided Yes             Assessment:     1. Irregular bleeding Periods irregular, had one after IUD removed last year, then nothing till September this year, spotting then and again first October then heavy bleeding since about mid October  - Beta hCG quant (ref lab)  2. Menorrhagia with irregular cycle Heavy bleeding since mid October Changes pads every 1-2 hours  Will check labs and talk when results, if negative QHCG will rx megace to stop bleeding, and she may want IUD later  - CBC - Iron, TIBC and Ferritin Panel - Beta hCG quant (ref lab)  3. Pelvic pain Sharp at times and on and off None today     Plan:     Follow up TBD

## 2022-04-12 ENCOUNTER — Other Ambulatory Visit: Payer: Medicaid Other | Admitting: *Deleted

## 2022-04-12 ENCOUNTER — Encounter: Payer: Self-pay | Admitting: *Deleted

## 2022-04-12 LAB — CBC
Hematocrit: 35.3 % (ref 34.0–46.6)
Hemoglobin: 11.4 g/dL (ref 11.1–15.9)
MCH: 26.3 pg — ABNORMAL LOW (ref 26.6–33.0)
MCHC: 32.3 g/dL (ref 31.5–35.7)
MCV: 81 fL (ref 79–97)
Platelets: 502 10*3/uL — ABNORMAL HIGH (ref 150–450)
RBC: 4.34 x10E6/uL (ref 3.77–5.28)
RDW: 13.5 % (ref 11.7–15.4)
WBC: 10.5 10*3/uL (ref 3.4–10.8)

## 2022-04-12 LAB — IRON,TIBC AND FERRITIN PANEL
Ferritin: 21 ng/mL (ref 15–150)
Iron Saturation: 7 % — CL (ref 15–55)
Iron: 22 ug/dL — ABNORMAL LOW (ref 27–159)
Total Iron Binding Capacity: 334 ug/dL (ref 250–450)
UIBC: 312 ug/dL (ref 131–425)

## 2022-04-12 LAB — BETA HCG QUANT (REF LAB): hCG Quant: <1 m[IU]/mL

## 2022-04-12 NOTE — Patient Outreach (Addendum)
Medicaid Managed Care   Nurse Care Manager Note  04/12/2022 Name:  Kristin Baker MRN:  [id number] DOB:  [birth year]  Kristin Baker is an 31 y.o. year old female who is a primary patient of [name], [place].  The [facility] Managed Care Coordination team was consulted for assistance with:    HTN pain  Kristin Baker was given information about Medicaid Managed Care Coordination team services today. Kristin Baker Patient agreed to services and verbal consent obtained.  Engaged with patient by telephone for follow up visit in response to provider referral for case management and/or care coordination services.   Assessments/Interventions:  Review of past medical history, allergies, medications, health status, including review of consultants reports, laboratory and other test data, was performed as part of comprehensive evaluation and provision of chronic care management services.  SDOH (Social Determinants of Health) assessments and interventions performed: SDOH Interventions    Flowsheet Row Patient Outreach Telephone from 04/12/2022 in [place] Patient Outreach Telephone from 02/11/2022 in [place] Coordination Nutrition from 12/17/2021 in [facility] Patient Outreach Telephone from 11/07/2021 in [place] Patient Outreach Telephone from 10/02/2021 in [place] Patient Outreach Telephone from 09/07/2021 in [place] Coordination  SDOH Interventions        Food Insecurity Interventions Intervention Not Indicated -- -- -- -- --  Housing Interventions -- Intervention Not Indicated -- -- -- --  Transportation Interventions -- Intervention Not Indicated -- -- Intervention Not Indicated --  Utilities Interventions Intervention Not Indicated -- -- -- -- --  Depression  Interventions/Treatment  -- -- Referral to Psychiatry -- -- --  Stress Interventions -- -- -- [employer], Live Life Well -- [employer], Provide Counseling       Care Plan  Allergies  Allergen Reactions   Poison Ivy Extract Anaphylaxis   Poison Oak Extract Anaphylaxis   Flexeril [Cyclobenzaprine] Hives   Lactose Intolerance (Gi) Diarrhea   Honeysuckle Flower [Lonicera] Rash   Mixed Grasses Rash    Medications Reviewed Today     Reviewed by Melissa Montane, RN (Registered Nurse) on 04/12/22 at [phone]  Med List Status: <None>   Medication Order Taking? Sig Documenting Provider Last Dose Status Informant  amphetamine-dextroamphetamine (ADDERALL XR) 20 MG 24 hr capsule [id number] No Take 20 mg by mouth daily. [provider] Taking Active   azelastine (ASTELIN) 0.1 % nasal spray [id number] No 2 sprays per nostril 1-2 times daily as needed. [name], MD Taking Active   Cholecalciferol (VITAMIN D3) 25 MCG (1000 UT) CAPS [id number] No Take 1 capsule (1,000 Units total) by mouth daily. [name], FNP Taking Active            Med Note [name]   Wed Oct 24, 2021  8:29 AM) 2,000 units daily  fluticasone (FLOVENT HFA) 110 MCG/ACT inhaler [id number] No Inhale 2 puffs into the lungs 2 (two) times daily. With spacer. [name], MD Taking Active   Galcanezumab-gnlm [facility]) 120 MG/ML [name] [id number] No Inject 1 pen into the skin every 28 (twenty-eight) days. Sater, Nanine Means, MD Taking Active            Med Note (Jakaden Ouzts A   Mon Feb 11, 2022  9:12 AM) Medication on back order  hydrochlorothiazide (HYDRODIURIL) 12.5 MG tablet [id number] No TAKE 1 TABLET(12.5 MG) BY MOUTH DAILY [name], FNP  Taking Active   ibuprofen (ADVIL) 800 MG tablet [id number] No Take 800 mg by mouth every 8 (eight) hours as needed for moderate pain. [provider] Taking Active   levocetirizine (XYZAL) 5 MG  tablet [id number] No Take 1 tablet (5 mg total) by mouth in the morning and at bedtime. [name], MD Taking Expired 12/13/21 2359   levothyroxine (SYNTHROID) 112 MCG tablet [id number] No Take 1 tablet (112 mcg total) by mouth daily. [name], FNP Taking Active   meloxicam (MOBIC) 15 MG tablet [id number] No Take 1 tablet (15 mg total) by mouth daily. [name], DPM Taking Active   rizatriptan (MAXALT-MLT) 10 MG disintegrating tablet [id number] No DISSOLVE ONE TABLET BY MOUTH AS NEEDED FOR MIGRAINE. MAY REPEAT IN 2 HOURS IF NEEDED. NO MORE THAN 2 TABLETS DAILY OR 10 TABLETS A MONTH. Sater, Nanine Means, MD Taking Active   tiZANidine (ZANAFLEX) 4 MG tablet [id number] No Take 4 mg by mouth every 6 (six) hours as needed for muscle spasms. [provider] Taking Active            Med Note (Makira Holleman A   Thu Dec 13, 2021 11:17 AM) Taking 2 tablets at bedtime  triamcinolone (NASACORT) 55 MCG/ACT AERO nasal inhaler [id number] No Place 1 spray into the nose daily. [name], MD Taking Active   VENTOLIN HFA 108 [phone]) MCG/ACT inhaler [id number] No INHALE 2 PUFFS INTO THE LUNGS EVERY 4 HOURS AS NEEDED FOR WHEEZING OR SHORTNESS OF BREATH OR COUGH [name], MD Taking Active             Patient Active Problem List   Diagnosis Date Noted   Irregular bleeding 04/11/2022   Menorrhagia with irregular cycle 04/11/2022   Pelvic pain 04/11/2022   Annual physical exam 10/24/2021   Fatigue 10/24/2021   Injury of foot, left, initial encounter 10/11/2021   Left shoulder pain 08/30/2021   Seasonal and perennial allergic rhinitis 07/18/2021   Hyperlipidemia 06/25/2021   H/O multiple allergies 05/14/2021   Anxiety and depression 05/14/2021   Morbid obesity ([place]) 05/14/2021   Hypertension 05/14/2021   Allergies 05/14/2021   Chronic bilateral low back pain without sciatica 05/14/2021   Vitamin D deficiency 01/15/2021   Chronic migraine w/o aura, not  intractable, w/o stat migr 01/15/2021   Chronic daily headache 01/15/2021   White matter abnormality on MRI of brain 01/15/2021   Acquired hypothyroidism 07/10/2020   Essential hypertension 07/10/2020   DDD (degenerative disc disease), lumbar 01/11/2020    Conditions to be addressed/monitored per PCP order:  HTN and pain  Care Plan : RN Care Manager Plan of Care  Updates made by Melissa Montane, RN since 04/12/2022 12:00 AM     Problem: Health Management needs related to Chronic Pain      Long-Range Goal: Development of Plan of Care to address Health Management needs related to Chronic Pain   Start Date: 08/31/2021  Expected End Date: 06/11/2022  Priority: High  Note:   Current Barriers:  Chronic Disease Management support and education needs related to Chronic Pain Kristin Baker has follow up with [facility] on 04/15/22. She is seeing a psychologist and therapist. She request assistance with applying for disability.   RNCM Clinical Goal(s):  Patient will verbalize understanding of plan for management of chronic back pain as evidenced by patient verbalization of self monitoring activities continue to work with RN Care Manager and/or Social Worker to address care management and care  coordination needs related to chronic back pain as evidenced by adherence to CM Team Scheduled appointments     through collaboration with RN Care manager, provider, and care team.   Interventions: Inter-disciplinary care team collaboration (see longitudinal plan of care) Evaluation of current treatment plan related to  self management and patient's adherence to plan as established by provider Reviewed upcoming appointments:  04/15/22 with [place], 04/25/22 with PCP, 04/29/22 with Nutrition and 06/12/21 with Neurology Advised patient ton contact [place] for member benefits($100 for vitamins, $75 yearly reward)  Hypertension Interventions:  (Status:  Goal on track:  Yes.) Long Term Goal Last  practice recorded BP readings:  BP Readings from Last 3 Encounters:  04/11/22 (!) 140/85  03/13/22 126/82  10/24/21 (!) 136/94  Most recent eGFR/CrCl:  Lab Results  Component Value Date   EGFR 91 08/27/2021    No components found for: "CRCL"  Evaluation of current treatment plan related to hypertension self management and patient's adherence to plan as established by provider Reviewed medications with patient and discussed importance of compliance Counseled on the importance of exercise goals with target of 150 minutes per week Discussed plans with patient for ongoing care management follow up and provided patient with direct contact information for care management team Provided education on prescribed diet DASH Reviewed and discussed recent readings, patient feels that recent elevated BP is related to increased pain Advised to take all medications as prescribed   Pain:  (Status: Goal on Track (progressing): YES.) Long Term Goal  Pain assessment performed Medications reviewed, discussed new medications added for pain Discussed importance of adherence to all scheduled medical appointments; Counseled on the importance of reporting any/all new or changed pain symptoms or management strategies to pain management provider; Discussed use of relaxation techniques and/or diversional activities to assist with pain reduction (distraction, imagery, relaxation, massage, acupressure, TENS, heat, and cold application; Reviewed with patient prescribed pharmacological and nonpharmacological pain relief strategies; Assessed social determinant of health barriers;  Encouraged patient to call and schedule follow up with [place]  Patient Goals/Self-Care Activities: Call provider office for new concerns or questions  Exercise daily, start with 10 minutes a day Eat foods to that help improve pain, avoid sugar Use heat/ice and relaxation to help improve pain       Follow Up:  Patient  agrees to Care Plan and Follow-up.  Plan: The Managed Medicaid care management team will reach out to the patient again over the next 60 days.  Date/time of next scheduled RN care management/care coordination outreach:  06/11/21 @ [place]adalupe Guerra RN, [place] RN Care Coordinator

## 2022-04-12 NOTE — Patient Instructions (Addendum)
Visit Information  Ms. Krisko was given information about Medicaid Managed Care team care coordination services as a part of their [place] Medicaid benefit. [name] verbally consented to engagement with the [facility] Managed Care team.   If you are experiencing a medical emergency, please call 911 or report to your local emergency department or urgent care.   If you have a non-emergency medical problem during routine business hours, please contact your provider's office and ask to speak with a nurse.   For questions related to your [facility], please call: [phone] or visit the homepage here: [email or web address]  If you would like to schedule transportation through your [facility], please call the following number at least 2 days in advance of your appointment: [phone]   Rides for urgent appointments can also be made after hours by calling Member Services.  Call the [place] at [phone], at any time, 24 hours a day, 7 days a week. If you are in danger or need immediate medical attention call 911.  If you would like help to quit smoking, call 1-800-QUIT-NOW (215)421-6456) OR Espaol: 1-855-Djelo-Ya (7-564-332-9518) o para ms informacin haga clic aqu or Text READY to 200-400 to register via text  Ms. Prieto,   Please see education materials related to HTN provided by MyChart link.  Patient verbalizes understanding of instructions and care plan provided today and agrees to view in [place]. Active MyChart status and patient understanding of how to access instructions and care plan via MyChart confirmed with patient.     Telephone follow up appointment with Managed Medicaid care management team member scheduled for:06/11/21 @ [place] RN, [place] RN Care  Coordinator   Following is a copy of your plan of care:  Care Plan : RN Care Manager Plan of Care  Updates made by Melissa Montane, RN since 04/12/2022 12:00 AM     Problem: Health Management needs related to Chronic Pain      Long-Range Goal: Development of Plan of Care to address Health Management needs related to Chronic Pain   Start Date: 08/31/2021  Expected End Date: 06/11/2022  Priority: High  Note:   Current Barriers:  Chronic Disease Management support and education needs related to Chronic Pain Ms. Sesler has follow up with [place] on 04/15/22. She is seeing a psychologist and therapist. She request assistance with applying for disability.   RNCM Clinical Goal(s):  Patient will verbalize understanding of plan for management of chronic back pain as evidenced by patient verbalization of self monitoring activities continue to work with RN Care Manager and/or Social Worker to address care management and care coordination needs related to chronic back pain as evidenced by adherence to CM Team Scheduled appointments     through collaboration with RN Care manager, provider, and care team.   Interventions: Inter-disciplinary care team collaboration (see longitudinal plan of care) Evaluation of current treatment plan related to  self management and patient's adherence to plan as established by provider Reviewed upcoming appointments:  04/15/22 with [place], 04/25/22 with PCP, 04/29/22 with Nutrition and 06/12/21 with Neurology Advised patient ton contact [place] for member benefits($100 for vitamins, $75 yearly reward)  Hypertension Interventions:  (Status:  Goal on track:  Yes.) Long Term Goal Last practice recorded BP readings:  BP Readings from Last 3 Encounters:  04/11/22 (!) 140/85  03/13/22 126/82  10/24/21 (!) 136/94  Most recent  eGFR/CrCl:  Lab Results  Component Value Date   EGFR 91 08/27/2021    No components found for: "CRCL"  Evaluation of  current treatment plan related to hypertension self management and patient's adherence to plan as established by provider Reviewed medications with patient and discussed importance of compliance Counseled on the importance of exercise goals with target of 150 minutes per week Discussed plans with patient for ongoing care management follow up and provided patient with direct contact information for care management team Provided education on prescribed diet DASH Reviewed and discussed recent readings, patient feels that recent elevated BP is related to increased pain Advised to take all medications as prescribed   Pain:  (Status: Goal on Track (progressing): YES.) Long Term Goal  Pain assessment performed Medications reviewed, discussed new medications added for pain Discussed importance of adherence to all scheduled medical appointments; Counseled on the importance of reporting any/all new or changed pain symptoms or management strategies to pain management provider; Discussed use of relaxation techniques and/or diversional activities to assist with pain reduction (distraction, imagery, relaxation, massage, acupressure, TENS, heat, and cold application; Reviewed with patient prescribed pharmacological and nonpharmacological pain relief strategies; Assessed social determinant of health barriers;  Encouraged patient to call and schedule follow up with [place]  Patient Goals/Self-Care Activities: Call provider office for new concerns or questions  Exercise daily, start with 10 minutes a day Eat foods to that help improve pain, avoid sugar Use heat/ice and relaxation to help improve pain

## 2022-04-15 DIAGNOSIS — M546 Pain in thoracic spine: Secondary | ICD-10-CM | POA: Diagnosis not present

## 2022-04-16 ENCOUNTER — Other Ambulatory Visit: Payer: Medicaid Other

## 2022-04-16 ENCOUNTER — Other Ambulatory Visit: Payer: Self-pay | Admitting: Orthopedic Surgery

## 2022-04-16 DIAGNOSIS — M546 Pain in thoracic spine: Secondary | ICD-10-CM

## 2022-04-16 NOTE — Patient Outreach (Addendum)
Medicaid Managed Care Social Work Note  04/16/2022 Name:  Kristin Baker MRN:  [id number] DOB:  1991/04/11  Kristin Baker is an 31 y.o. year old female who is a primary patient of Gilmore Laroche, FNP.  The [facility] Managed Care Coordination team was consulted for assistance with:   disability questions  Kristin Baker was given information about Medicaid Managed Care Coordination team services today. Kristin Baker Patient agreed to services and verbal consent obtained.  Engaged with patient  for by telephone forfollow up visit in response to referral for case management and/or care coordination services.   Assessments/Interventions:  Review of past medical history, allergies, medications, health status, including review of consultants reports, laboratory and other test data, was performed as part of comprehensive evaluation and provision of chronic care management services.  SDOH: (Social Determinant of Health) assessments and interventions performed: SDOH Interventions    Flowsheet Row Patient Outreach Telephone from 04/12/2022 in [place] [facility] DEPARTMENT Patient Outreach Telephone from 02/11/2022 in [facility] Community Care Coordination Nutrition from 12/17/2021 in [facility] Patient Outreach Telephone from 11/07/2021 in [facility] Care Coordination Patient Outreach Telephone from 10/02/2021 in [facility] Patient Outreach Telephone from 09/07/2021 in [facility] Coord[facility]  SDOH Interventions        Food Insecurity Interventions Intervention Not Indicated -- -- -- -- --  Housing Interventions -- Intervention Not Indicated -- -- -- --  Transportation Interventions -- Intervention Not Indicated -- -- Intervention Not Indicated --  Utilities Interventions Intervention Not Indicated -- -- -- -- --  Depression  Interventions/Treatment  -- -- Referral to Psychiatry -- -- --  Stress Interventions -- -- -- [employer], Live Life Well -- [employer], Provide Counseling     BSW completed a telephone outreach with patient. BSW answered all of patients questions around applying for disability. BSW directed patient to the [facility].gov website to start the process. No other resources are needed at this time.   Advanced Directives Status:  Not addressed in this encounter.  Care Plan                 Allergies  Allergen Reactions   Poison Ivy Extract Anaphylaxis   Poison Oak Extract Anaphylaxis   Flexeril [Cyclobenzaprine] Hives   Lactose Intolerance (Gi) Diarrhea   Honeysuckle Flower [Lonicera] Rash   Mixed Grasses Rash    Medications Reviewed Today     Reviewed by [name], RN (Registered Nurse) on 04/12/22 at [phone]  Med List Status: <None>   Medication Order Taking? Sig Documenting Provider Last Dose Status Informant  amphetamine-dextroamphetamine (ADDERALL XR) 20 MG 24 hr capsule [id number] No Take 20 mg by mouth daily. [provider] Taking Active   azelastine (ASTELIN) 0.1 % nasal spray [id number] No 2 sprays per nostril 1-2 times daily as needed. [name], MD Taking Active   Cholecalciferol (VITAMIN D3) 25 MCG (1000 UT) CAPS [id number] No Take 1 capsule (1,000 Units total) by mouth daily. [name], FNP Taking Active            Med Note Harriet Pho   Wed Oct 24, 2021  8:29 AM) 2,000 units daily  fluticasone (FLOVENT HFA) 110 MCG/ACT inhaler [id number] No Inhale 2 puffs into the lungs 2 (two) times daily. With spacer. [name], MD Taking Active   Galcanezumab-gnlm [facility]) 120 MG/ML Ivory Broad [id number] No Inject 1 pen into the skin  every 28 (twenty-eight) days. Sater, [name], MD Taking Active            Med Note (ROBB, MELANIE A   Mon Feb 11, 2022  9:12 AM) Medication on back order   hydrochlorothiazide (HYDRODIURIL) 12.5 MG tablet [id number] No TAKE 1 TABLET(12.5 MG) BY MOUTH DAILY Gilmore Laroche, FNP Taking Active   ibuprofen (ADVIL) 800 MG tablet [id number] No Take 800 mg by mouth every 8 (eight) hours as needed for moderate pain. [provider] Taking Active   levocetirizine (XYZAL) 5 MG tablet [id number] No Take 1 tablet (5 mg total) by mouth in the morning and at bedtime. [name], MD Taking Expired 12/13/21 2359   levothyroxine (SYNTHROID) 112 MCG tablet [id number] No Take 1 tablet (112 mcg total) by mouth daily. [name], FNP Taking Active   meloxicam (MOBIC) 15 MG tablet [id number] No Take 1 tablet (15 mg total) by mouth daily. Candelaria Stagers, DPM Taking Active   rizatriptan (MAXALT-MLT) 10 MG disintegrating tablet [id number] No DISSOLVE ONE TABLET BY MOUTH AS NEEDED FOR MIGRAINE. MAY REPEAT IN 2 HOURS IF NEEDED. NO MORE THAN 2 TABLETS DAILY OR 10 TABLETS A MONTH. Sater, [name], MD Taking Active   tiZANidine (ZANAFLEX) 4 MG tablet [id number] No Take 4 mg by mouth every 6 (six) hours as needed for muscle spasms. [provider] Taking Active            Med Note (ROBB, MELANIE A   Thu Dec 13, 2021 11:17 AM) Taking 2 tablets at bedtime  triamcinolone (NASACORT) 55 MCG/ACT AERO nasal inhaler [id number] No Place 1 spray into the nose daily. [name], MD Taking Active   VENTOLIN HFA 108 [phone]) MCG/ACT inhaler [id number] No INHALE 2 PUFFS INTO THE LUNGS EVERY 4 HOURS AS NEEDED FOR WHEEZING OR SHORTNESS OF BREATH OR COUGH [name], MD Taking Active             Patient Active Problem List   Diagnosis Date Noted   Irregular bleeding 04/11/2022   Menorrhagia with irregular cycle 04/11/2022   Pelvic pain 04/11/2022   Annual physical exam 10/24/2021   Fatigue 10/24/2021   Injury of foot, left, initial encounter 10/11/2021   Left shoulder pain 08/30/2021   Seasonal and perennial allergic rhinitis  07/18/2021   Hyperlipidemia 06/25/2021   H/O multiple allergies 05/14/2021   Anxiety and depression 05/14/2021   Morbid obesity (HCC) 05/14/2021   Hypertension 05/14/2021   Allergies 05/14/2021   Chronic bilateral low back pain without sciatica 05/14/2021   Vitamin D deficiency 01/15/2021   Chronic migraine w/o aura, not intractable, w/o stat migr 01/15/2021   Chronic daily headache 01/15/2021   White matter abnormality on MRI of brain 01/15/2021   Acquired hypothyroidism 07/10/2020   Essential hypertension 07/10/2020   DDD (degenerative disc disease), lumbar 01/11/2020    Conditions to be addressed/monitored per PCP order:   applying for disability   There are no care plans that you recently modified to display for this patient.   Follow up:  Patient agrees to Care Plan and Follow-up.  Plan: The  Patient has been provided with contact information for the Managed Medicaid care management team and has been advised to call with any health related questions or concerns.    Gus Puma, BSW, [facility]  [place]  High Risk Managed Medicaid Team  [phone]

## 2022-04-16 NOTE — Patient Instructions (Signed)
Visit Information  Kristin Baker was given information about Medicaid Managed Care team care coordination services as a part of their [facility] Community Plan Medicaid benefit. Kristin Baker verbally consented to engagement with the [facility] Managed Care team.   If you are experiencing a medical emergency, please call 911 or report to your local emergency department or urgent care.   If you have a non-emergency medical problem during routine business hours, please contact your provider's office and ask to speak with a nurse.   For questions related to your [facility], please call: [phone] or visit the homepage here: [email or web address]  If you would like to schedule transportation through your [facility], please call the following number at least 2 days in advance of your appointment: [phone]   Rides for urgent appointments can also be made after hours by calling Member Services.  Call the Behavioral Health Crisis Line at [phone], at any time, 24 hours a day, 7 days a week. If you are in danger or need immediate medical attention call 911.  If you would like help to quit smoking, call 1-800-QUIT-NOW (204-427-0002) OR Espaol: 1-855-Djelo-Ya (8-416-606-3016) o para ms informacin haga clic aqu or Text READY to 010-932 to register via text  Kristin Baker - following are the goals we discussed in your visit today:   Goals Addressed   None      The  Patient                                              has been provided with contact information for the Managed Medicaid care management team and has been advised to call with any health related questions or concerns.   Gus Puma, BSW, [facility]  [place]  High Risk Managed Medicaid Team  [phone]   Following is a copy of your plan of care:  There are no care plans that you  recently modified to display for this patient.

## 2022-04-22 DIAGNOSIS — M47816 Spondylosis without myelopathy or radiculopathy, lumbar region: Secondary | ICD-10-CM | POA: Diagnosis not present

## 2022-04-25 ENCOUNTER — Encounter: Payer: Self-pay | Admitting: Family Medicine

## 2022-04-25 ENCOUNTER — Ambulatory Visit (INDEPENDENT_AMBULATORY_CARE_PROVIDER_SITE_OTHER): Payer: Medicaid Other | Admitting: Family Medicine

## 2022-04-25 VITALS — BP 130/88 | HR 95 | Ht 66.5 in | Wt 297.1 lb

## 2022-04-25 DIAGNOSIS — M5136 Other intervertebral disc degeneration, lumbar region: Secondary | ICD-10-CM | POA: Diagnosis not present

## 2022-04-25 DIAGNOSIS — I10 Essential (primary) hypertension: Secondary | ICD-10-CM | POA: Diagnosis not present

## 2022-04-25 DIAGNOSIS — E559 Vitamin D deficiency, unspecified: Secondary | ICD-10-CM

## 2022-04-25 DIAGNOSIS — E782 Mixed hyperlipidemia: Secondary | ICD-10-CM | POA: Diagnosis not present

## 2022-04-25 DIAGNOSIS — R7301 Impaired fasting glucose: Secondary | ICD-10-CM

## 2022-04-25 DIAGNOSIS — E7849 Other hyperlipidemia: Secondary | ICD-10-CM | POA: Diagnosis not present

## 2022-04-25 DIAGNOSIS — E039 Hypothyroidism, unspecified: Secondary | ICD-10-CM

## 2022-04-25 NOTE — Progress Notes (Signed)
Established Patient Office Visit  Subjective:  Patient ID: Kristin Baker, female    DOB: Sep 08, 1990  Age: 31 y.o. MRN: [id number]  CC:  Chief Complaint  Patient presents with   Follow-up    6 month f/u, reports starting on iron per gyn due to cycle issues. Is doing well with bp.    Migraine    Pt reports migraine been worse.     HPI Kristin Baker is a 31 y.o. female with past medical history of essential hypertension, anxiety and depression presents for f/u of  chronic medical conditions.  Hypertension: She takes hydrochlorothiazide 12.5 mg daily.  She reports compliance with treatment regimen.  Hypothyroidism: She takes Synthroid 112 mcg daily and reports compliance with treatment regimen.  Migraine headaches: She is following up with her neurologist on 06/12/2021.  She reports having daily frontotemporal migraines headaches daily.  She takes Emgality every 28 days and reports that the medication used to decrease her medication frequency, but recently has not.  Her last injection was 2 weeks ago.  She reports taking rizatriptan as needed to relieve her symptoms.  Past Medical History:  Diagnosis Date   Abdominal pain    ADHD    ANA positive    Back pain    DDD (degenerative disc disease), lumbar    Eye pain, right    Fatigue    Frontal sinusitis    Hypertension    Hypothyroidism    Obesity, morbid (HCC)    Palpitations    Rheumatoid arthritis ([place])     Past Surgical History:  Procedure Laterality Date   ABLATION     05/22/2020 left side, 06/14/2020 right side     Family History  Problem Relation Age of Onset   Hyperlipidemia Mother    Peripheral Artery Disease Mother    ADD / ADHD Mother    Bipolar disorder Mother    Cervical cancer Mother        at age 75.   Diabetes Father    Heart attack Father    ADD / ADHD Sister    Healthy Daughter    ADD / ADHD Maternal Aunt    Multiple sclerosis Paternal Aunt    Cervical cancer Maternal Grandmother    Breast  cancer Maternal Grandmother    Skin cancer Maternal Grandmother    Heart attack Maternal Grandfather    Colon cancer Neg Hx     Social History   Socioeconomic History   Marital status: Married    Spouse name: Not on file   Number of children: 1   Years of education: Not on file   Highest education level: Associate degree: academic program  Occupational History   Not on file  Tobacco Use   Smoking status: Former    Packs/day: 1.00    Years: 15.00    Total pack years: 15.00    Types: Cigarettes    Quit date: 11/24/2020    Years since quitting: 1.4   Smokeless tobacco: Never   Tobacco comments:    She has quit of and on.  Vaping Use   Vaping Use: Every day  Substance and Sexual Activity   Alcohol use: Yes    Comment: occasionally /social events   Drug use: Not Currently    Comment: smoked weed during teen years   Sexual activity: Yes    Birth control/protection: None  Other Topics Concern   Not on file  Social History Narrative   Married for May 2021.Lives with husband and  daughter.Homemaker.Husband works at [employer].   Social Determinants of Health   Financial Resource Strain: Low Risk  (09/05/2020)   Overall Financial Resource Strain (CARDIA)    Difficulty of Paying Living Expenses: Not very hard  Food Insecurity: No Food Insecurity (04/12/2022)   Hunger Vital Sign    Worried About Running Out of Food in the Last Year: Never true    Ran Out of Food in the Last Year: Never true  Transportation Needs: No Transportation Needs (02/11/2022)   PRAPARE - Hydrologist (Medical): No    Lack of Transportation (Non-Medical): No  Physical Activity: Insufficiently Active (09/05/2020)   Exercise Vital Sign    Days of Exercise per Week: 3 days    Minutes of Exercise per Session: 30 min  Stress: No Stress Concern Present (11/07/2021)   [place]    Feeling of Stress : Only  a little  Recent Concern: Stress - Stress Concern Present (09/07/2021)   [place]    Feeling of Stress : To some extent  Social Connections: Moderately Isolated (09/05/2020)   Social Connection and Isolation Panel [NHANES]    Frequency of Communication with Friends and Family: More than three times a week    Frequency of Social Gatherings with Friends and Family: Twice a week    Attends Religious Services: Never    Marine scientist or Organizations: No    Attends Archivist Meetings: Never    Marital Status: Married  Human resources officer Violence: Not At Risk (07/03/2021)   Humiliation, Afraid, Rape, and Kick questionnaire    Fear of Current or Ex-Partner: No    Emotionally Abused: No    Physically Abused: No    Sexually Abused: No    Outpatient Medications Prior to Visit  Medication Sig Dispense Refill   amphetamine-dextroamphetamine (ADDERALL XR) 20 MG 24 hr capsule Take 20 mg by mouth daily.     azelastine (ASTELIN) 0.1 % nasal spray 2 sprays per nostril 1-2 times daily as needed. 30 mL 5   Cholecalciferol (VITAMIN D3) 25 MCG (1000 UT) CAPS Take 1 capsule (1,000 Units total) by mouth daily. 30 capsule 3   fluticasone (FLOVENT HFA) 110 MCG/ACT inhaler Inhale 2 puffs into the lungs 2 (two) times daily. With spacer. 1 each 5   Galcanezumab-gnlm (EMGALITY) 120 MG/ML SOAJ Inject 1 pen into the skin every 28 (twenty-eight) days. 3 mL 4   hydrochlorothiazide (HYDRODIURIL) 12.5 MG tablet TAKE 1 TABLET(12.5 MG) BY MOUTH DAILY 90 tablet 0   ibuprofen (ADVIL) 800 MG tablet Take 800 mg by mouth every 8 (eight) hours as needed for moderate pain.     levothyroxine (SYNTHROID) 112 MCG tablet Take 1 tablet (112 mcg total) by mouth daily. 90 tablet 3   meloxicam (MOBIC) 15 MG tablet Take 1 tablet (15 mg total) by mouth daily. 30 tablet 0   rizatriptan (MAXALT-MLT) 10 MG disintegrating tablet DISSOLVE ONE TABLET BY MOUTH AS  NEEDED FOR MIGRAINE. MAY REPEAT IN 2 HOURS IF NEEDED. NO MORE THAN 2 TABLETS DAILY OR 10 TABLETS A MONTH. 10 tablet 5   tiZANidine (ZANAFLEX) 4 MG tablet Take 4 mg by mouth every 6 (six) hours as needed for muscle spasms.     triamcinolone (NASACORT) 55 MCG/ACT AERO nasal inhaler Place 1 spray into the nose daily. 16.9 mL 5   VENTOLIN HFA 108 (90 Base) MCG/ACT inhaler  INHALE 2 PUFFS INTO THE LUNGS EVERY 4 HOURS AS NEEDED FOR WHEEZING OR SHORTNESS OF BREATH OR COUGH 18 g 1   levocetirizine (XYZAL) 5 MG tablet Take 1 tablet (5 mg total) by mouth in the morning and at bedtime. 60 tablet 5   No facility-administered medications prior to visit.    Allergies  Allergen Reactions   Poison Ivy Extract Anaphylaxis   Poison Oak Extract Anaphylaxis   Flexeril [Cyclobenzaprine] Hives   Lactose Intolerance (Gi) Diarrhea   Honeysuckle Flower [Lonicera] Rash   Mixed Grasses Rash    ROS Review of Systems  Constitutional:  Negative for chills and fever.  Respiratory:  Negative for shortness of breath.   Cardiovascular:  Negative for chest pain and palpitations.  Neurological:  Negative for dizziness.      Objective:    Physical Exam HENT:     Head: Normocephalic.     Right Ear: External ear normal.     Left Ear: External ear normal.  Cardiovascular:     Rate and Rhythm: Normal rate and regular rhythm.     Heart sounds: No murmur heard. Pulmonary:     Effort: Pulmonary effort is normal.     Breath sounds: Normal breath sounds.  Neurological:     Mental Status: She is alert.     BP 130/88 (BP Location: Left Arm)   Pulse 95   Ht 5' 6.5" (1.689 m)   Wt 297 lb 1.9 oz (134.8 kg)   LMP 03/10/2022 (Approximate)   SpO2 96%   BMI 47.24 kg/m  Wt Readings from Last 3 Encounters:  04/25/22 297 lb 1.9 oz (134.8 kg)  04/11/22 (!) 300 lb 4 oz (136.2 kg)  03/13/22 (!) 307 lb 0.3 oz (139.3 kg)    Lab Results  Component Value Date   TSH 3.390 10/19/2021   Lab Results  Component Value Date    WBC 10.5 04/11/2022   HGB 11.4 04/11/2022   HCT 35.3 04/11/2022   MCV 81 04/11/2022   PLT 502 (H) 04/11/2022   Lab Results  Component Value Date   NA 139 08/27/2021   K 3.6 08/27/2021   CO2 23 08/27/2021   GLUCOSE 92 08/27/2021   BUN 3 (L) 08/27/2021   CREATININE 0.87 08/27/2021   BILITOT 0.3 08/27/2021   ALKPHOS 98 08/27/2021   AST 10 08/27/2021   ALT 12 08/27/2021   PROT 6.9 08/27/2021   ALBUMIN 4.3 08/27/2021   CALCIUM 8.9 08/27/2021   ANIONGAP 7 04/13/2018   EGFR 91 08/27/2021   Lab Results  Component Value Date   CHOL 199 08/27/2021   Lab Results  Component Value Date   HDL 35 (L) 08/27/2021   Lab Results  Component Value Date   LDLCALC 141 (H) 08/27/2021   Lab Results  Component Value Date   TRIG 126 08/27/2021   Lab Results  Component Value Date   CHOLHDL 5.7 (H) 08/27/2021   Lab Results  Component Value Date   HGBA1C 5.5 06/19/2021      Assessment & Plan:  Essential hypertension Assessment & Plan: Controlled Encouraged to continue hydrochlorothiazide 12.5 mg daily BP Readings from Last 3 Encounters:  04/25/22 130/88  04/11/22 (!) 140/85  03/13/22 126/82     Orders: -     CMP14+EGFR  Mixed hyperlipidemia Assessment & Plan: Will assess lipid panel today Lab Results  Component Value Date   CHOL 199 08/27/2021   HDL 35 (L) 08/27/2021   LDLCALC 141 (H) 08/27/2021   TRIG  126 08/27/2021   CHOLHDL 5.7 (H) 08/27/2021      Acquired hypothyroidism Assessment & Plan: She takes Synthroid 112 mcg daily on empty stomach She reports compliance with treatment regimen We will check TSH and T4 levels today  Orders: -     TSH + free T4  Other hyperlipidemia Assessment & Plan: Will assess lipid panel today Lab Results  Component Value Date   CHOL 199 08/27/2021   HDL 35 (L) 08/27/2021   LDLCALC 141 (H) 08/27/2021   TRIG 126 08/27/2021   CHOLHDL 5.7 (H) 08/27/2021     Orders: -     Lipid panel  DDD (degenerative disc disease),  lumbar  IFG (impaired fasting glucose) -     Hemoglobin A1c  Vitamin D deficiency -     VITAMIN D 25 Hydroxy (Vit-D Deficiency, Fractures)    Follow-up: Return in about 5 months (around 09/24/2022).   [name], FNP

## 2022-04-25 NOTE — Assessment & Plan Note (Signed)
Controlled Encouraged to continue hydrochlorothiazide 12.5 mg daily BP Readings from Last 3 Encounters:  04/25/22 130/88  04/11/22 (!) 140/85  03/13/22 126/82

## 2022-04-25 NOTE — Assessment & Plan Note (Signed)
She takes Synthroid 112 mcg daily on empty stomach She reports compliance with treatment regimen We will check TSH and T4 levels today

## 2022-04-25 NOTE — Assessment & Plan Note (Signed)
Will assess lipid panel today Lab Results  Component Value Date   CHOL 199 08/27/2021   HDL 35 (L) 08/27/2021   LDLCALC 141 (H) 08/27/2021   TRIG 126 08/27/2021   CHOLHDL 5.7 (H) 08/27/2021

## 2022-04-25 NOTE — Patient Instructions (Addendum)
I appreciate the opportunity to provide care to you today!    Follow up:  5 months  Labs: please stop by the lab during the week to get your blood drawn ( CMP, TSH, Lipid profile, HgA1c, Vit D)     Please continue to a heart-healthy diet and increase your physical activities. Try to exercise for at least three times a week.      It was a pleasure to see you and I look forward to continuing to work together on your health and well-being. Please do not hesitate to call the office if you need care or have questions about your care.   Have a wonderful day and week. With Gratitude, Gilmore Laroche MSN, FNP-BC

## 2022-04-26 DIAGNOSIS — E7849 Other hyperlipidemia: Secondary | ICD-10-CM | POA: Diagnosis not present

## 2022-04-26 DIAGNOSIS — I10 Essential (primary) hypertension: Secondary | ICD-10-CM | POA: Diagnosis not present

## 2022-04-26 DIAGNOSIS — R7301 Impaired fasting glucose: Secondary | ICD-10-CM | POA: Diagnosis not present

## 2022-04-26 DIAGNOSIS — E039 Hypothyroidism, unspecified: Secondary | ICD-10-CM | POA: Diagnosis not present

## 2022-04-26 DIAGNOSIS — E559 Vitamin D deficiency, unspecified: Secondary | ICD-10-CM | POA: Diagnosis not present

## 2022-04-27 LAB — HEMOGLOBIN A1C
Est. average glucose Bld gHb Est-mCnc: 117 mg/dL
Hgb A1c MFr Bld: 5.7 % — ABNORMAL HIGH (ref 4.8–5.6)

## 2022-04-27 LAB — LIPID PANEL
Chol/HDL Ratio: 5.2 ratio — ABNORMAL HIGH (ref 0.0–4.4)
Cholesterol, Total: 198 mg/dL (ref 100–199)
HDL: 38 mg/dL — ABNORMAL LOW (ref >39)
LDL Chol Calc (NIH): 137 mg/dL — ABNORMAL HIGH (ref 0–99)
Triglycerides: 125 mg/dL (ref 0–149)
VLDL Cholesterol Cal: 23 mg/dL (ref 5–40)

## 2022-04-27 LAB — CMP14+EGFR
ALT: 11 IU/L (ref 0–32)
AST: 11 IU/L (ref 0–40)
Albumin/Globulin Ratio: 1.7 (ref 1.2–2.2)
Albumin: 4.3 g/dL (ref 3.9–4.9)
Alkaline Phosphatase: 71 IU/L (ref 44–121)
BUN/Creatinine Ratio: 11 (ref 9–23)
BUN: 8 mg/dL (ref 6–20)
Bilirubin Total: <0.2 mg/dL (ref 0.0–1.2)
CO2: 21 mmol/L (ref 20–29)
Calcium: 9.3 mg/dL (ref 8.7–10.2)
Chloride: 100 mmol/L (ref 96–106)
Creatinine, Ser: 0.76 mg/dL (ref 0.57–1.00)
Globulin, Total: 2.6 g/dL (ref 1.5–4.5)
Glucose: 88 mg/dL (ref 70–99)
Potassium: 3.8 mmol/L (ref 3.5–5.2)
Sodium: 137 mmol/L (ref 134–144)
Total Protein: 6.9 g/dL (ref 6.0–8.5)
eGFR: 107 mL/min/{1.73_m2} (ref >59)

## 2022-04-27 LAB — TSH+FREE T4
Free T4: 1.13 ng/dL (ref 0.82–1.77)
TSH: 2.53 u[IU]/mL (ref 0.450–4.500)

## 2022-04-27 LAB — VITAMIN D 25 HYDROXY (VIT D DEFICIENCY, FRACTURES): Vit D, 25-Hydroxy: 38.7 ng/mL (ref 30.0–100.0)

## 2022-04-29 ENCOUNTER — Ambulatory Visit: Payer: Medicaid Other | Admitting: Nutrition

## 2022-05-05 NOTE — Progress Notes (Signed)
Please inform the patient her cholesterol levels are slightly elevated.  I recommend low carbs and fat diet with increased physical activities.  Her labs indicate that she is prediabetic, I recommend decreasing her intake of sugary foods.  All other labs are stable.

## 2022-05-06 ENCOUNTER — Encounter: Payer: Self-pay | Admitting: Adult Health

## 2022-05-06 ENCOUNTER — Ambulatory Visit (INDEPENDENT_AMBULATORY_CARE_PROVIDER_SITE_OTHER): Payer: Medicaid Other | Admitting: Adult Health

## 2022-05-06 VITALS — BP 125/78 | HR 75 | Ht 66.5 in | Wt 295.0 lb

## 2022-05-06 DIAGNOSIS — N921 Excessive and frequent menstruation with irregular cycle: Secondary | ICD-10-CM

## 2022-05-06 DIAGNOSIS — E611 Iron deficiency: Secondary | ICD-10-CM | POA: Diagnosis not present

## 2022-05-06 MED ORDER — MEGESTROL ACETATE 40 MG PO TABS: ORAL_TABLET | ORAL | 1 refills | Status: DC

## 2022-05-06 NOTE — Progress Notes (Signed)
  Subjective:     Patient ID: Kristin Baker, female   DOB: Aug 19, 1990, 31 y.o.   MRN: [id number]  HPI Kristin Baker is a 31 year old white female,married, G2P1011 in for follow up on bleeding and still bleeding, feels tired and fainted last week. She said she had iron infusion as a child. Has had more headaches since bleeding. Last seen 04/11/22 and and bleeding started in October. HGB was 11.4 04/11/22 and iron sat was 7, iron level 22 ferritin 21. Did not have recheck in 2 weeks from 04/11/22.  She had labs 04/26/22 from PCP but not CBC or iron panel.  Last pap was negative for malignancy and HPV 09/05/20  PCP is Gilmore Laroche, NP.   Review of Systems +still bleeding +tired Reviewed past medical,surgical, social and family history. Reviewed medications and allergies.     Objective:   Physical Exam BP 125/78 (BP Location: Right Arm, Patient Position: Sitting, Cuff Size: Normal)   Pulse 75   Ht 5' 6.5" (1.689 m)   Wt 295 lb (133.8 kg)   LMP 03/10/2022 (Approximate)   BMI 46.90 kg/m     Skin warm and dry.  Lungs: clear to ausculation bilaterally. Cardiovascular: regular rate and rhythm.  Pelvic: external genitalia is normal in appearance no lesions, vagina: dark red blood, no odor,urethra has no lesions or masses noted, cervix:smooth and bulbous, uterus: normal size, shape and contour, mildly tender, no masses felt, adnexa: no masses or tenderness noted. Bladder is non tender and no masses felt.   Upstream - 05/06/22 1158       Pregnancy Intention Screening   Does the patient want to become pregnant in the next year? No    Does the patient's partner want to become pregnant in the next year? No    Would the patient like to discuss contraceptive options today? Yes      Contraception Wrap Up   Current Method No Contraceptive Precautions;Abstinence    End Method Abstinence    Contraception Counseling Provided No            Examination chaperoned by Dorthula Perfect RN Assessment:     1.  Menorrhagia with irregular cycle May bleed light at times and then heavier, when changes pad every hour Will try megace to stop bleeding  Discussed IUD insertion, even ablation as option Handout given on ablation Push fluids  - CBC  2. Low iron Feels tired and fainted last week  Continue OTC iron - Iron, TIBC and Ferritin Panel     Plan:     Follow up in 1 week for ROS

## 2022-05-07 LAB — CBC
Hematocrit: 33.6 % — ABNORMAL LOW (ref 34.0–46.6)
Hemoglobin: 10.8 g/dL — ABNORMAL LOW (ref 11.1–15.9)
MCH: 25.9 pg — ABNORMAL LOW (ref 26.6–33.0)
MCHC: 32.1 g/dL (ref 31.5–35.7)
MCV: 81 fL (ref 79–97)
Platelets: 532 10*3/uL — ABNORMAL HIGH (ref 150–450)
RBC: 4.17 x10E6/uL (ref 3.77–5.28)
RDW: 13.5 % (ref 11.7–15.4)
WBC: 9.3 10*3/uL (ref 3.4–10.8)

## 2022-05-07 LAB — IRON,TIBC AND FERRITIN PANEL
Ferritin: 18 ng/mL (ref 15–150)
Iron Saturation: 5 % — CL (ref 15–55)
Iron: 20 ug/dL — ABNORMAL LOW (ref 27–159)
Total Iron Binding Capacity: 365 ug/dL (ref 250–450)
UIBC: 345 ug/dL (ref 131–425)

## 2022-05-08 ENCOUNTER — Telehealth: Payer: Self-pay | Admitting: *Deleted

## 2022-05-08 ENCOUNTER — Other Ambulatory Visit: Payer: Self-pay | Admitting: Adult Health

## 2022-05-08 NOTE — Telephone Encounter (Signed)
I called [facility] on [address] and clarified the instructions for Megace. 3 tabs daily for 5 days, then 2 tabs daily for 5 days then 1 tab daily until bleeding stops. Pt aware. JSY

## 2022-05-11 ENCOUNTER — Ambulatory Visit
Admission: RE | Admit: 2022-05-11 | Discharge: 2022-05-11 | Disposition: A | Discharge status: Home / Self Care | Payer: Medicaid Other | Source: Ambulatory Visit | Attending: Orthopedic Surgery | Admitting: Orthopedic Surgery

## 2022-05-11 DIAGNOSIS — M546 Pain in thoracic spine: Secondary | ICD-10-CM

## 2022-05-13 ENCOUNTER — Ambulatory Visit: Payer: Medicaid Other | Admitting: Dietician

## 2022-05-13 ENCOUNTER — Ambulatory Visit: Payer: Medicaid Other | Admitting: Adult Health

## 2022-05-17 DIAGNOSIS — M546 Pain in thoracic spine: Secondary | ICD-10-CM | POA: Diagnosis not present

## 2022-05-24 ENCOUNTER — Encounter: Payer: Self-pay | Admitting: Adult Health

## 2022-05-24 ENCOUNTER — Ambulatory Visit (INDEPENDENT_AMBULATORY_CARE_PROVIDER_SITE_OTHER): Payer: Medicaid Other | Admitting: Adult Health

## 2022-05-24 VITALS — BP 146/97 | HR 100 | Ht 66.5 in | Wt 298.0 lb

## 2022-05-24 DIAGNOSIS — N921 Excessive and frequent menstruation with irregular cycle: Secondary | ICD-10-CM | POA: Diagnosis not present

## 2022-05-24 DIAGNOSIS — D500 Iron deficiency anemia secondary to blood loss (chronic): Secondary | ICD-10-CM

## 2022-05-24 DIAGNOSIS — E611 Iron deficiency: Secondary | ICD-10-CM

## 2022-05-24 LAB — POCT HEMOGLOBIN: Hemoglobin: 7.7 g/dL — AB (ref 11–14.6)

## 2022-05-24 MED ORDER — MEGESTROL ACETATE 40 MG PO TABS: 40.0000 mg | ORAL_TABLET | Freq: Every day | ORAL | 1 refills | Status: DC

## 2022-05-24 NOTE — Progress Notes (Signed)
  Subjective:     Patient ID: Kristin Baker, female   DOB: August 05, 1990, 31 y.o.   MRN: [id number]  HPI Kristin Baker is a 31 year old white female, married, G2P1011 back inf follow up on taking megace for bleeding and it has stopped.   Last pap was 09/05/20 negative HPV and malignancy  PCP is [facility].  Review of Systems Bleeding has stopped with megace 40 mg 1 daily Has headaches at times and nausea with megace  Reviewed past medical,surgical, social and family history. Reviewed medications and allergies.     Objective:   Physical Exam BP (!) 146/97 (BP Location: Left Arm, Patient Position: Sitting, Cuff Size: Large)   Pulse 100   Ht 5' 6.5" (1.689 m)   Wt 298 lb (135.2 kg)   BMI 47.38 kg/m  POC HGB 7.7 Skin warm and dry. Lungs: clear to ausculation bilaterally. Cardiovascular: regular rate and rhythm.    Discussed with Dr Despina Hidden, agrees with plan.   Upstream - 05/24/22 1129       Pregnancy Intention Screening   Does the patient want to become pregnant in the next year? No    Does the patient's partner want to become pregnant in the next year? No    Would the patient like to discuss contraceptive options today? Yes      Contraception Wrap Up   Current Method No Method - Other Reason;Abstinence    End Method No Method - Other Reason;Abstinence             Assessment:     1. Menorrhagia with irregular cycle Megace 40 mg 1 daily has stopped bleeding Continue megace for now, but she wants to get endometrial ablation and tubal.  Meds ordered this encounter  Medications   megestrol (MEGACE) 40 MG tablet    Sig: Take 1 tablet (40 mg total) by mouth daily.    Dispense:  30 tablet    Refill:  1    Order Specific Question:   Supervising Provider    Answer:   Despina Hidden, LUTHER H [[id number]]     2. Low iron  3. Iron deficiency anemia due to chronic blood loss POC HGB was 7.7 will check CBC Take OTC iron    Plan:     Follow up with Dr Charlotta Newton for pre op for tubal and ablation,  tubal papers signed today

## 2022-05-25 LAB — CBC
Hematocrit: 33.7 % — ABNORMAL LOW (ref 34.0–46.6)
Hemoglobin: 10.4 g/dL — ABNORMAL LOW (ref 11.1–15.9)
MCH: 24.6 pg — ABNORMAL LOW (ref 26.6–33.0)
MCHC: 30.9 g/dL — ABNORMAL LOW (ref 31.5–35.7)
MCV: 80 fL (ref 79–97)
Platelets: 607 10*3/uL — ABNORMAL HIGH (ref 150–450)
RBC: 4.22 x10E6/uL (ref 3.77–5.28)
RDW: 14.1 % (ref 11.7–15.4)
WBC: 10.2 10*3/uL (ref 3.4–10.8)

## 2022-05-28 ENCOUNTER — Other Ambulatory Visit: Payer: Self-pay | Admitting: Adult Health

## 2022-05-28 DIAGNOSIS — D75839 Thrombocytosis, unspecified: Secondary | ICD-10-CM

## 2022-05-28 NOTE — Progress Notes (Signed)
Platelets elevated refer to hematology

## 2022-06-03 DIAGNOSIS — H40013 Open angle with borderline findings, low risk, bilateral: Secondary | ICD-10-CM | POA: Diagnosis not present

## 2022-06-05 ENCOUNTER — Encounter: Payer: Self-pay | Admitting: Obstetrics & Gynecology

## 2022-06-05 ENCOUNTER — Ambulatory Visit (INDEPENDENT_AMBULATORY_CARE_PROVIDER_SITE_OTHER): Payer: Medicaid Other | Admitting: Obstetrics & Gynecology

## 2022-06-05 VITALS — BP 156/89 | HR 118 | Ht 66.0 in | Wt 294.6 lb

## 2022-06-05 DIAGNOSIS — Z3009 Encounter for other general counseling and advice on contraception: Secondary | ICD-10-CM | POA: Diagnosis not present

## 2022-06-05 DIAGNOSIS — I10 Essential (primary) hypertension: Secondary | ICD-10-CM | POA: Diagnosis not present

## 2022-06-05 DIAGNOSIS — N939 Abnormal uterine and vaginal bleeding, unspecified: Secondary | ICD-10-CM

## 2022-06-05 DIAGNOSIS — Z30430 Encounter for insertion of intrauterine contraceptive device: Secondary | ICD-10-CM

## 2022-06-05 DIAGNOSIS — Z3202 Encounter for pregnancy test, result negative: Secondary | ICD-10-CM

## 2022-06-05 LAB — POCT URINE PREGNANCY: Preg Test, Ur: NEGATIVE

## 2022-06-05 MED ORDER — LEVONORGESTREL 20.1 MCG/DAY IU IUD
1.0000 | INTRAUTERINE_SYSTEM | Freq: Once | INTRAUTERINE | Status: AC
  Administered 2022-06-05: 1 via INTRAUTERINE

## 2022-06-05 NOTE — Progress Notes (Signed)
GYN VISIT Patient name: Kristin Baker MRN [id number]  Date of birth: April 23, 1991 Chief Complaint:   Pre-op Exam (Discuss tubal and ablation (papers signed))  History of Present Illness:   Kristin Baker is a 32 y.o. G27P1011 female being seen today follow up to discuss the following concerns:  -HMB: Notes h/o irregular periods.  Postpartum had IUD and did not have a period for many years.  Device was removed 2 yrs ago and noted some light spotting immediately after, but not having regular periods.  Had a period in Sept, then October had a period that never stopped.  Soaking through a pad every 1-2 hours, maybe slow for a day or so then would restart.  Seen by Electa Sniff started on Megace which did in fact stop her period.  She has been on Megace every since.  Megace causes nausea and GI upset and would prefer to discuss alternative options.     Contraception: She does not desire a pregnancy in the future and desires sterilization via salpingectomy.  Concerned about risk of ectopic with IUD and notes family h/o ovarian cancer.  Paperwork for sterilization completed at her last visit with Electa Sniff.      04/25/2022    8:52 AM 04/11/2022    2:15 PM 03/13/2022   10:35 AM 12/17/2021    5:21 PM 12/17/2021    5:20 PM  Depression screen PHQ 2/9  Decreased Interest 0 0 0 3   Down, Depressed, Hopeless 0 0 0 3   PHQ - 2 Score 0 0 0 6   Altered sleeping 0      Tired, decreased energy 0   3   Change in appetite 0   3   Feeling bad or failure about yourself  0   2   Trouble concentrating 0   3   Moving slowly or fidgety/restless 0   0   Suicidal thoughts 0   0   PHQ-9 Score 0      Difficult doing work/chores Not difficult at all   Extremely dIfficult Extremely dIfficult     Review of Systems:   Pertinent items are noted in HPI Denies fever/chills, dizziness, headaches, visual disturbances, fatigue, shortness of breath, chest pain, abdominal pain, vomiting. Pertinent History Reviewed:   Reviewed past medical,surgical, social, obstetrical and family history.  Reviewed problem list, medications and allergies. Physical Assessment:   Vitals:   06/05/22 0848  BP: (!) 156/89  Pulse: (!) 118  Weight: 294 lb 9.6 oz (133.6 kg)  Height: 5\' 6"  (1.676 m)  Body mass index is 47.55 kg/m.       Physical Examination:   General appearance: alert, well appearing, and in no distress  Psych: mood appropriate, normal affect  Skin: warm & dry   Cardiovascular: normal heart rate noted  Respiratory: normal respiratory effort, no distress  Abdomen: obese, soft, non-tender, no rebound, no guarding  Pelvic: VULVA: normal appearing vulva with no masses, tenderness or lesions, VAGINA: normal appearing vagina with normal color and discharge, no lesions, CERVIX: normal appearing cervix without discharge or lesions  Extremities: no edema, no calf tenderness bilaterally  Chaperone: Celene Squibb    IUD INSERTION   The risks and benefits of the method and placement have been thouroughly reviewed with the patient and all questions were answered.  Specifically the patient is aware of failure rate of 05/998, expulsion of the IUD and of possible perforation.  The patient is aware of irregular bleeding due to the method and  understands the incidence of irregular bleeding diminishes with time.  Signed copy of informed consent in chart.    A sterile speculum was placed in the vagina.  The cervix was visualized, prepped using Betadine, and grasped with a single tooth tenaculum. The uterus was sounded to 9 cm.  Liletta  IUD placed per manufacturer's recommendations. The strings were trimmed to approximately 3 cm. The patient tolerated the procedure well.    Assessment & Plan:  1) AUB -reviewed medical vs surgical interventions -discussed risk/benefit of each option -due to young age discussed concerns of return of menses s/p ablation in 10-79yrs.  Discussed ablation +/- IUD if needed -after much  discussion agreeable to IUD for now and monitor bleeding -will also plan for pelvic US to r/o underlying etiology -see above IUD procedure  2) Sterilization -reviewed risk/benefit of tubal ligation  Patient desires permanent sterilization.  Other reversible forms of contraception were discussed with patient; she declines all other modalities. Risks of procedure discussed with patient including but not limited to: risk of regret, permanence of method, bleeding, infection, injury to surrounding organs and need for additional procedures.  Also discussed possibility of post-tubal pain syndrome. Patient verbalized understanding of these risks and wants to proceed with sterilization via salpingectomy.  Referral created to schedule  Orders Placed This Encounter  Procedures   US PELVIC COMPLETE WITH TRANSVAGINAL   POCT urine pregnancy    Return for please schedule pelvic US next available AP.   Janyth Pupa, DO Attending [place], [facility] for [employer], [place]

## 2022-06-11 ENCOUNTER — Other Ambulatory Visit: Payer: Medicaid Other | Admitting: *Deleted

## 2022-06-11 ENCOUNTER — Encounter: Payer: Self-pay | Admitting: Obstetrics & Gynecology

## 2022-06-11 NOTE — Patient Instructions (Signed)
Visit Information  Ms. Davitt was given information about Medicaid Managed Care team care coordination services as a part of their [place] Medicaid benefit. [name] verbally consented to engagement with the [facility] Managed Care team.   If you are experiencing a medical emergency, please call 911 or report to your local emergency department or urgent care.   If you have a non-emergency medical problem during routine business hours, please contact your provider's office and ask to speak with a nurse.   For questions related to your [facility], please call: [phone] or visit the homepage here: [email or web address]  If you would like to schedule transportation through your [facility], please call the following number at least 2 days in advance of your appointment: [phone]   Rides for urgent appointments can also be made after hours by calling Member Services.  Call the [facility] at [phone], at any time, 24 hours a day, 7 days a week. If you are in danger or need immediate medical attention call 911.  If you would like help to quit smoking, call 1-800-QUIT-NOW (931)423-1661) OR Espaol: 1-855-Djelo-Ya (7-782-423-5361) o para ms informacin haga clic aqu or Text READY to 200-400 to register via text  Ms. Tedesco,   Please see education materials related to HTN provided by MyChart link.  Patient verbalizes understanding of instructions and care plan provided today and agrees to view in [place]. Active MyChart status and patient understanding of how to access instructions and care plan via MyChart confirmed with patient.     Telephone follow up appointment with Managed Medicaid care management team member scheduled for:07/29/22 @ [place] [facility] RN, [place] RN Care  Coordinator   Following is a copy of your plan of care:  Care Plan : RN Care Manager Plan of Care  Updates made by Melissa Montane, RN since 06/11/2022 12:00 AM     Problem: Health Management needs related to Chronic Pain      Long-Range Goal: Development of Plan of Care to address Health Management needs related to Chronic Pain   Start Date: 08/31/2021  Expected End Date: 08/23/2022  Priority: High  Note:   Current Barriers:  Chronic Disease Management support and education needs related to Chronic Pain Ms. Knack has elevated platelets, she is scheduled for a Hematology consult on 06/19/22. She is aware of all upcoming appointments and has transportation. She would like to switch PCPs and denies any needs at this time.   RNCM Clinical Goal(s):  Patient will verbalize understanding of plan for management of chronic back pain as evidenced by patient verbalization of self monitoring activities continue to work with RN Care Manager and/or Social Worker to address care management and care coordination needs related to chronic back pain as evidenced by adherence to CM Team Scheduled appointments     through collaboration with RN Care manager, provider, and care team.   Interventions: Inter-disciplinary care team collaboration (see longitudinal plan of care) Evaluation of current treatment plan related to  self management and patient's adherence to plan as established by provider Reviewed upcoming appointments:  06/12/22 with Neurology, 06/13/22 for pelvic ultrasound, 06/19/22 for Hematology Consult, 07/05/22 for Preadmission testing, 07/09/22 for Procedure  Provided information on iron rich diet  Hypertension Interventions:  (Status:  Goal on track:  NO.) Long Term Goal Last practice recorded BP readings:  BP Readings from Last 3 Encounters:  06/05/22 Marland Kitchen)  156/89  05/24/22 (!) 146/97  05/06/22 125/78  Most recent eGFR/CrCl:  Lab Results  Component Value Date   EGFR 107 04/26/2022    No  components found for: "CRCL"  Evaluation of current treatment plan related to hypertension self management and patient's adherence to plan as established by provider Reviewed medications with patient and discussed importance of compliance Counseled on the importance of exercise goals with target of 150 minutes per week Discussed plans with patient for ongoing care management follow up and provided patient with direct contact information for care management team Provided education on prescribed diet DASH Reviewed and discussed recent readings, patient feels that recent elevated BP is related to increased pain Advised to take all medications as prescribed Provided information on managing HTN   Pain:  (Status: Goal on Track (progressing): YES.) Long Term Goal  Pain assessment performed Medications reviewed, discussed new medications added for pain Discussed importance of adherence to all scheduled medical appointments; Counseled on the importance of reporting any/all new or changed pain symptoms or management strategies to pain management provider; Discussed use of relaxation techniques and/or diversional activities to assist with pain reduction (distraction, imagery, relaxation, massage, acupressure, TENS, heat, and cold application; Reviewed with patient prescribed pharmacological and nonpharmacological pain relief strategies; Assessed social determinant of health barriers;  Encouraged patient to call and schedule follow up with [place]  Patient Goals/Self-Care Activities: Call provider office for new concerns or questions  Exercise daily, start with 10 minutes a day Eat foods to that help improve pain, avoid sugar Use heat/ice and relaxation to help improve pain

## 2022-06-11 NOTE — Progress Notes (Signed)
Chief Complaint  Patient presents with   Follow-up    Patient in room 2 for migraines. Reports for about 2 months headaches increased, still taking migraine medications and no relief. reports platelets are elevated seeing hematology for this. GYN referred to hematology. Reports being sensitive to light since July 2023.    HISTORY OF PRESENT ILLNESS:  06/12/22 ALL:  Jiah Bari is a 32 y.o. female here today for follow up for migraines. She was last seen by Dr Epimenio Foot 05/2021 and reported daily headaches with at least 15 migrainous days. She was started on Emgality for prevention. Topiramate d/c'd due to intolerance. Rizatriptan continued for abortive therapy. Since, she reports headaches did improve. She had 1-2 headache days on average. Rare migraines. Over the past two months, she has started having more daily tension/pressure headaches. This seemed to start around the time she started having more menstrual bleeding. IUD placed last wee. She continues Megace. She feels she is more sensitive to light. She was seen by ophthalmology for questionable concerns of glaucoma. She has follow up in June. She is followed by GYN and recently referred to hematology for elevated platelets. She has appt next week.    HISTORY (copied from Dr Bonnita Hollow previous note)  Brittanni Cariker is a 32 y.o. woman with chronic headaches and abnormal brain MRI.   Update 06/12/2021: She has daily headaches, about half with migrainous features since 06/12/22   She did not get a benefit from topiramate and she had tolerability issues.    Caffeine limitations and other lifetyle modifications have not helped.  She notes that Maxalt-MLT will help abort the migrainous headaches when they occur.     She notes the pain in the right frontal region as a throbbing.  Others are in the right occiput as a searing hot poker.   When HA intensifies (1-2 times a week) she gets increased pain and also has nausea and much worse photophobia.     When a HA occurs, she has trouble focusing.   Medications tried: Topiramate was not tolerated well and did not help.   She has tried Excedrin, ibuprofen and Aleve without benefit.  SOme of these also upser her stomach.  Cyclobenzaprine was not tolerated.   Other She often has trouble falling asleep and feels there are some nights that she only sleeps 2 hours.  She has not been told that she snores.   She has an aunt with MS. Jazilyn has not had episodes of numbness, visual changes, ataxia or other symptoms that would be typical for demyelination.     Imaging: MRI of the cervical spine 01/04/2021 showed minimal disc bulges at C3-C4, C5-C6 and C6-C7 that did not lead to nerve root compression or spinal stenosis.  There is a small syrinx adjacent to C6 unlikely to be significant.   MRI of the brain 05/24/2020 was fairly normal with just a single T2/flair hyperintense focus in the right parietal periventricular white matter.  This is nonspecific   REVIEW OF SYSTEMS: Out of a complete 14 system review of symptoms, headaches the patient complains only of the following symptoms, and all other reviewed systems are negative.   ALLERGIES: Allergies  Allergen Reactions   Poison Ivy Extract Anaphylaxis   Poison Oak Extract Anaphylaxis   Flexeril [Cyclobenzaprine] Hives   Lactose Intolerance (Gi) Diarrhea   Honeysuckle Flower [Lonicera] Rash   Mixed Grasses Rash     HOME MEDICATIONS: Outpatient Medications Prior to Visit  Medication Sig Dispense Refill  amphetamine-dextroamphetamine (ADDERALL XR) 20 MG 24 hr capsule Take 20 mg by mouth daily.     azelastine (ASTELIN) 0.1 % nasal spray 2 sprays per nostril 1-2 times daily as needed. 30 mL 5   Cholecalciferol (VITAMIN D3) 25 MCG (1000 UT) CAPS Take 1 capsule (1,000 Units total) by mouth daily. 30 capsule 3   fluticasone (FLOVENT HFA) 110 MCG/ACT inhaler Inhale 2 puffs into the lungs 2 (two) times daily. With spacer. 1 each 5    hydrochlorothiazide (HYDRODIURIL) 12.5 MG tablet TAKE 1 TABLET(12.5 MG) BY MOUTH DAILY 90 tablet 0   ibuprofen (ADVIL) 800 MG tablet Take 800 mg by mouth every 8 (eight) hours as needed for moderate pain.     levothyroxine (SYNTHROID) 112 MCG tablet Take 1 tablet (112 mcg total) by mouth daily. 90 tablet 3   megestrol (MEGACE) 40 MG tablet Take 1 tablet (40 mg total) by mouth daily. 30 tablet 1   meloxicam (MOBIC) 15 MG tablet Take 1 tablet (15 mg total) by mouth daily. 30 tablet 0   tiZANidine (ZANAFLEX) 4 MG tablet Take 4 mg by mouth every 6 (six) hours as needed for muscle spasms.     triamcinolone (NASACORT) 55 MCG/ACT AERO nasal inhaler Place 1 spray into the nose daily. 16.9 mL 5   VENTOLIN HFA 108 (90 Base) MCG/ACT inhaler INHALE 2 PUFFS INTO THE LUNGS EVERY 4 HOURS AS NEEDED FOR WHEEZING OR SHORTNESS OF BREATH OR COUGH 18 g 1   Galcanezumab-gnlm (EMGALITY) 120 MG/ML SOAJ Inject 1 pen into the skin every 28 (twenty-eight) days. 3 mL 4   rizatriptan (MAXALT-MLT) 10 MG disintegrating tablet DISSOLVE ONE TABLET BY MOUTH AS NEEDED FOR MIGRAINE. MAY REPEAT IN 2 HOURS IF NEEDED. NO MORE THAN 2 TABLETS DAILY OR 10 TABLETS A MONTH. 10 tablet 5   levocetirizine (XYZAL) 5 MG tablet Take 1 tablet (5 mg total) by mouth in the morning and at bedtime. 60 tablet 5   No facility-administered medications prior to visit.     PAST MEDICAL HISTORY: Past Medical History:  Diagnosis Date   Abdominal pain    ADHD    ANA positive    Back pain    DDD (degenerative disc disease), lumbar    Eye pain, right    Fatigue    Frontal sinusitis    Hypertension    Hypothyroidism    Obesity, morbid (HCC)    Palpitations    Rheumatoid arthritis ([place])      PAST SURGICAL HISTORY: Past Surgical History:  Procedure Laterality Date   ABLATION     05/22/2020 left side, 06/14/2020 right side      FAMILY HISTORY: Family History  Problem Relation Age of Onset   Cervical cancer Maternal Grandmother    Breast  cancer Maternal Grandmother    Skin cancer Maternal Grandmother    Heart attack Maternal Grandfather    Diabetes Father    Heart attack Father    Hyperlipidemia Mother    Peripheral Artery Disease Mother    ADD / ADHD Mother    Bipolar disorder Mother    Cervical cancer Mother        at age 3.   ADD / ADHD Sister    Healthy Daughter    Breast cancer Maternal Aunt    Cervical cancer Maternal Aunt    ADD / ADHD Maternal Aunt    Uterine cancer Maternal Aunt    Breast cancer Maternal Aunt    Multiple sclerosis Paternal Aunt  Colon cancer Neg Hx      SOCIAL HISTORY: Social History   Socioeconomic History   Marital status: Married    Spouse name: Not on file   Number of children: 1   Years of education: Not on file   Highest education level: Associate degree: academic program  Occupational History   Not on file  Tobacco Use   Smoking status: Former    Packs/day: 1.00    Years: 15.00    Total pack years: 15.00    Types: Cigarettes    Quit date: 11/24/2020    Years since quitting: 1.5   Smokeless tobacco: Never   Tobacco comments:    She has quit of and on.  Vaping Use   Vaping Use: Every day  Substance and Sexual Activity   Alcohol use: Yes    Comment: occasionally /social events   Drug use: Not Currently    Comment: smoked weed during teen years   Sexual activity: Yes    Birth control/protection: None, Abstinence  Other Topics Concern   Not on file  Social History Narrative   Married for May 2021.Lives with husband and daughter.Homemaker.Husband works at [employer].   Social Determinants of Health   Financial Resource Strain: Low Risk  (09/05/2020)   Overall Financial Resource Strain (CARDIA)    Difficulty of Paying Living Expenses: Not very hard  Food Insecurity: No Food Insecurity (06/11/2022)   Hunger Vital Sign    Worried About Running Out of Food in the Last Year: Never true    Ran Out of Food in the Last Year: Never true  Transportation  Needs: No Transportation Needs (06/11/2022)   PRAPARE - Administrator, Civil Service (Medical): No    Lack of Transportation (Non-Medical): No  Physical Activity: Insufficiently Active (09/05/2020)   Exercise Vital Sign    Days of Exercise per Week: 3 days    Minutes of Exercise per Session: 30 min  Stress: No Stress Concern Present (11/07/2021)   [employer] of Occupational Health - Occupational Stress Questionnaire    Feeling of Stress : Only a little  Recent Concern: Stress - Stress Concern Present (09/07/2021)   [employer] of Occupational Health - Occupational Stress Questionnaire    Feeling of Stress : To some extent  Social Connections: Moderately Isolated (09/05/2020)   Social Connection and Isolation Panel [NHANES]    Frequency of Communication with Friends and Family: More than three times a week    Frequency of Social Gatherings with Friends and Family: Twice a week    Attends Religious Services: Never    Database administrator or Organizations: No    Attends Banker Meetings: Never    Marital Status: Married  Catering manager Violence: Not At Risk (07/03/2021)   Humiliation, Afraid, Rape, and Kick questionnaire    Fear of Current or Ex-Partner: No    Emotionally Abused: No    Physically Abused: No    Sexually Abused: No     PHYSICAL EXAM  Vitals:   06/12/22 1030  BP: (!) 163/94  Pulse: 91  Weight: 296 lb 8 oz (134.5 kg)  Height: 5' 6.5" (1.689 m)   Body mass index is 47.14 kg/m.  Generalized: Well developed, in no acute distress  Cardiology: normal rate and rhythm, no murmur auscultated  Respiratory: clear to auscultation bilaterally    Neurological examination  Mentation: Alert oriented to time, place, history taking. Follows all commands speech and language fluent Cranial nerve  II-XII: Pupils were equal round reactive to light. Extraocular movements were full, visual field were full on confrontational test. Facial sensation  and strength were normal. Head turning and shoulder shrug  were normal and symmetric. Motor: The motor testing reveals 5 over 5 strength of all 4 extremities. Good symmetric motor tone is noted throughout.   Gait and station: Gait is normal.    DIAGNOSTIC DATA (LABS, IMAGING, TESTING) - I reviewed patient records, labs, notes, testing and imaging myself where available.  Lab Results  Component Value Date   WBC 10.2 05/24/2022   HGB 10.4 (L) 05/24/2022   HCT 33.7 (L) 05/24/2022   MCV 80 05/24/2022   PLT 607 (H) 05/24/2022      Component Value Date/Time   NA 137 04/26/2022 0820   K 3.8 04/26/2022 0820   CL 100 04/26/2022 0820   CO2 21 04/26/2022 0820   GLUCOSE 88 04/26/2022 0820   GLUCOSE 78 10/04/2020 0000   BUN 8 04/26/2022 0820   CREATININE 0.76 04/26/2022 0820   CREATININE 0.76 10/04/2020 0000   CALCIUM 9.3 04/26/2022 0820   PROT 6.9 04/26/2022 0820   ALBUMIN 4.3 04/26/2022 0820   AST 11 04/26/2022 0820   ALT 11 04/26/2022 0820   ALKPHOS 71 04/26/2022 0820   BILITOT <0.2 04/26/2022 0820   GFRNONAA 105 10/04/2020 0000   GFRAA 122 10/04/2020 0000   Lab Results  Component Value Date   CHOL 198 04/26/2022   HDL 38 (L) 04/26/2022   LDLCALC 137 (H) 04/26/2022   TRIG 125 04/26/2022   CHOLHDL 5.2 (H) 04/26/2022   Lab Results  Component Value Date   HGBA1C 5.7 (H) 04/26/2022   Lab Results  Component Value Date   VITAMINB12 345 03/13/2022   Lab Results  Component Value Date   TSH 2.530 04/26/2022        No data to display               No data to display           ASSESSMENT AND PLAN  32 y.o. year old female  has a past medical history of Abdominal pain, ADHD, ANA positive, Back pain, DDD (degenerative disc disease), lumbar, Eye pain, right, Fatigue, Frontal sinusitis, Hypertension, Hypothyroidism, Obesity, morbid ([place]), Palpitations, and Rheumatoid arthritis ([place]). here with    Chronic migraine w/o aura, not intractable, w/o stat migr  Larene Beach  Mcquinn reports migraines are well managed on Emgality and rizatriptan. We will continue current treatment plan. She will continue close follow up with PCP, GYN, hematology and ophthalmology. If tension headaches do not improve after menstrual bleeding is controlled we can consider adding prevention meds. Healthy lifestyle habits encouraged. She will return to see me in 1 year, sooner if needed. She verbalizes understanding and agreement with this plan.   No orders of the defined types were placed in this encounter.    Meds ordered this encounter  Medications   Galcanezumab-gnlm (EMGALITY) 120 MG/ML SOAJ    Sig: Inject 1 pen  into the skin every 30 (thirty) days.    Dispense:  3 mL    Refill:  3    Order Specific Question:   Supervising Provider    Answer:   Melvenia Beam [[id number]]   rizatriptan (MAXALT-MLT) 10 MG disintegrating tablet    Sig: DISSOLVE ONE TABLET BY MOUTH AS NEEDED FOR MIGRAINE. MAY REPEAT IN 2 HOURS IF NEEDED. NO MORE THAN 2 TABLETS DAILY OR 10 TABLETS A MONTH.  Dispense:  10 tablet    Refill:  5    Order Specific Question:   Supervising Provider    Answer:   Anson Fret [[id number]]     Shawnie Dapper, MSN, FNP-C 06/12/2022, 10:56 AM  [facility] [address] [place], [place] [address] [phone]

## 2022-06-11 NOTE — Patient Outreach (Signed)
Medicaid Managed Care   Nurse Care Manager Note  06/11/2022 Name:  Kristin Baker MRN:  [id number] DOB:  1990-07-12  Kristin Baker is an 32 y.o. year old female who is a primary patient of Kristin Baker, [place].  The [facility] Managed Care Coordination team was consulted for assistance with:    HTN Pain  [name] was given information about Medicaid Managed Care Coordination team services today. [name] Patient agreed to services and verbal consent obtained.  Engaged with patient by telephone for follow up visit in response to provider referral for case management and/or care coordination services.   Assessments/Interventions:  Review of past medical history, allergies, medications, health status, including review of consultants reports, laboratory and other test data, was performed as part of comprehensive evaluation and provision of chronic care management services.  SDOH (Social Determinants of Health) assessments and interventions performed: SDOH Interventions    Flowsheet Row Patient Outreach Telephone from 06/11/2022 in [place] Patient Outreach Telephone from 04/12/2022 in [place] Patient Outreach Telephone from 02/11/2022 in [place] Coordination Nutrition from 12/17/2021 in [facility] Patient Outreach Telephone from 11/07/2021 in [place] Patient Outreach Telephone from 10/02/2021 in [place] Interventions        Food Insecurity Interventions Intervention Not Indicated Intervention Not Indicated -- -- -- --  Housing Interventions -- -- Intervention Not Indicated -- -- --  Transportation Interventions Intervention Not Indicated -- Intervention Not Indicated -- -- Intervention Not Indicated  Utilities Interventions -- Intervention Not Indicated --  -- -- --  Depression Interventions/Treatment  -- -- -- Referral to Psychiatry -- --  Stress Interventions -- -- -- -- [employer], Live Life Well --       Care Plan  Allergies  Allergen Reactions   Poison Ivy Extract Anaphylaxis   Poison Oak Extract Anaphylaxis   Flexeril [Cyclobenzaprine] Hives   Lactose Intolerance (Gi) Diarrhea   Honeysuckle Flower [Lonicera] Rash   Mixed Grasses Rash    Medications Reviewed Today     Reviewed by Melissa Montane, RN (Registered Nurse) on 06/11/22 at [place] List Status: <None>   Medication Order Taking? Sig Documenting Provider Last Dose Status Informant  amphetamine-dextroamphetamine (ADDERALL XR) 20 MG 24 hr capsule [id number] Yes Take 20 mg by mouth daily. [provider] Taking Active   azelastine (ASTELIN) 0.1 % nasal spray [id number] Yes 2 sprays per nostril 1-2 times daily as needed. Kristin Shaggy, MD Taking Active   Cholecalciferol (VITAMIN D3) 25 MCG (1000 UT) CAPS [id number] Yes Take 1 capsule (1,000 Units total) by mouth daily. [name], FNP Taking Active            Med Note [name]   Wed Oct 24, 2021  8:29 AM) 2,000 units daily  fluticasone (FLOVENT HFA) 110 MCG/ACT inhaler [id number] Yes Inhale 2 puffs into the lungs 2 (two) times daily. With spacer. Kristin Shaggy, MD Taking Active   Galcanezumab-gnlm [facility]) 120 MG/ML Kristin Baker [id number] Yes Inject 1 pen into the skin every 28 (twenty-eight) days. Britt Bottom, MD Taking Active            Med Note Kristin Baker A   Tue Jun 11, 2022  9:14 AM)    hydrochlorothiazide (HYDRODIURIL) 12.5 MG tablet [id number] Yes TAKE 1 TABLET(12.5 MG) BY MOUTH DAILY Kristin Monday, FNP Taking Active  ibuprofen (ADVIL) 800 MG tablet [id number] Yes Take 800 mg by mouth every 8 (eight) hours as needed for moderate pain. [provider] Taking Active   levocetirizine (XYZAL) 5 MG tablet [id number]  Take 1 tablet (5 mg total) by  mouth in the morning and at bedtime. [name], MD  Expired 12/13/21 2359   levothyroxine (SYNTHROID) 112 MCG tablet [id number] Yes Take 1 tablet (112 mcg total) by mouth daily. Kristin Beers, FNP Taking Active   megestrol (MEGACE) 40 MG tablet [id number] Yes Take 1 tablet (40 mg total) by mouth daily. Kristin Potter, NP Taking Active   meloxicam (MOBIC) 15 MG tablet [id number] Yes Take 1 tablet (15 mg total) by mouth daily. Kristin Baker, DPM Taking Active   rizatriptan (MAXALT-MLT) 10 MG disintegrating tablet [id number] Yes DISSOLVE ONE TABLET BY MOUTH AS NEEDED FOR MIGRAINE. MAY REPEAT IN 2 HOURS IF NEEDED. NO MORE THAN 2 TABLETS DAILY OR 10 TABLETS A MONTH. Sater, Kristin Furl, MD Taking Active   tiZANidine (ZANAFLEX) 4 MG tablet [id number] Yes Take 4 mg by mouth every 6 (six) hours as needed for muscle spasms. [provider] Taking Active            Med Note (Kristin Baker A   Tue Jun 11, 2022  9:15 AM)    triamcinolone (NASACORT) 55 MCG/ACT AERO nasal inhaler [id number] Yes Place 1 spray into the nose daily. [name], MD Taking Active   VENTOLIN HFA 108 [phone]) MCG/ACT inhaler [id number] Yes INHALE 2 PUFFS INTO THE LUNGS EVERY 4 HOURS AS NEEDED FOR WHEEZING OR SHORTNESS OF BREATH OR COUGH [name], MD Taking Active             Patient Active Problem List   Diagnosis Date Noted   Low iron 05/06/2022   Irregular bleeding 04/11/2022   Menorrhagia with irregular cycle 04/11/2022   Pelvic pain 04/11/2022   Annual physical exam 10/24/2021   Fatigue 10/24/2021   Injury of foot, left, initial encounter 10/11/2021   Left shoulder pain 08/30/2021   Seasonal and perennial allergic rhinitis 07/18/2021   Hyperlipidemia 06/25/2021   H/O multiple allergies 05/14/2021   Anxiety and depression 05/14/2021   Morbid obesity (HCC) 05/14/2021   Hypertension 05/14/2021   Allergies 05/14/2021   Chronic bilateral low back pain without sciatica  05/14/2021   Vitamin D deficiency 01/15/2021   Chronic migraine w/o aura, not intractable, w/o stat migr 01/15/2021   Chronic daily headache 01/15/2021   White matter abnormality on MRI of brain 01/15/2021   Acquired hypothyroidism 07/10/2020   Essential hypertension 07/10/2020   DDD (degenerative disc disease), lumbar 01/11/2020    Conditions to be addressed/monitored per PCP order:  HTN and Pain  Care Plan : RN Care Manager Plan of Care  Updates made by [name], RN since 06/11/2022 12:00 AM     Problem: Health Management needs related to Chronic Pain      Long-Range Goal: Development of Plan of Care to address Health Management needs related to Chronic Pain   Start Date: 08/31/2021  Expected End Date: 08/23/2022  Priority: High  Note:   Current Barriers:  Chronic Disease Management support and education needs related to Chronic Pain Ms. Sweeten has elevated platelets, she is scheduled for a Hematology consult on 06/19/22. She is aware of all upcoming appointments and has transportation. She would like to switch PCPs and denies any needs at this time.   RNCM Clinical Goal(s):  Patient  will verbalize understanding of plan for management of chronic back pain as evidenced by patient verbalization of self monitoring activities continue to work with RN Care Manager and/or Social Worker to address care management and care coordination needs related to chronic back pain as evidenced by adherence to CM Team Scheduled appointments     through collaboration with RN Care manager, provider, and care team.   Interventions: Inter-disciplinary care team collaboration (see longitudinal plan of care) Evaluation of current treatment plan related to  self management and patient's adherence to plan as established by provider Reviewed upcoming appointments:  06/12/22 with Neurology, 06/13/22 for pelvic ultrasound, 06/19/22 for Hematology Consult, 07/05/22 for Preadmission testing, 07/09/22 for Procedure   Provided information on iron rich diet  Hypertension Interventions:  (Status:  Goal on track:  NO.) Long Term Goal Last practice recorded BP readings:  BP Readings from Last 3 Encounters:  06/05/22 (!) 156/89  05/24/22 (!) 146/97  05/06/22 125/78  Most recent eGFR/CrCl:  Lab Results  Component Value Date   EGFR 107 04/26/2022    No components found for: "CRCL"  Evaluation of current treatment plan related to hypertension self management and patient's adherence to plan as established by provider Reviewed medications with patient and discussed importance of compliance Counseled on the importance of exercise goals with target of 150 minutes per week Discussed plans with patient for ongoing care management follow up and provided patient with direct contact information for care management team Provided education on prescribed diet DASH Reviewed and discussed recent readings, patient feels that recent elevated BP is related to increased pain Advised to take all medications as prescribed Provided information on managing HTN   Pain:  (Status: Goal on Track (progressing): YES.) Long Term Goal  Pain assessment performed Medications reviewed, discussed new medications added for pain Discussed importance of adherence to all scheduled medical appointments; Counseled on the importance of reporting any/all new or changed pain symptoms or management strategies to pain management provider; Discussed use of relaxation techniques and/or diversional activities to assist with pain reduction (distraction, imagery, relaxation, massage, acupressure, TENS, heat, and cold application; Reviewed with patient prescribed pharmacological and nonpharmacological pain relief strategies; Assessed social determinant of health barriers;  Encouraged patient to call and schedule follow up with [place]  Patient Goals/Self-Care Activities: Call provider office for new concerns or questions  Exercise  daily, start with 10 minutes a day Eat foods to that help improve pain, avoid sugar Use heat/ice and relaxation to help improve pain       Follow Up:  Patient agrees to Care Plan and Follow-up.  Plan: The Managed Medicaid care management team will reach out to the patient again over the next 45 days.  Date/time of next scheduled RN care management/care coordination outreach:  07/29/22 @ [place] RN, BSN [place]  [employer] manager

## 2022-06-11 NOTE — Patient Instructions (Signed)
Below is our plan:  We will continue and rizatriptan   Please make sure you are staying well hydrated. I recommend 50-60 ounces daily. Well balanced diet and regular exercise encouraged. Consistent sleep schedule with 6-8 hours recommended.   Please continue follow up with care team as directed.   Follow up with me in 1 year   You may receive a survey regarding today's visit. I encourage you to leave honest feed back as I do use this information to improve patient care. Thank you for seeing me today!

## 2022-06-12 ENCOUNTER — Encounter: Payer: Self-pay | Admitting: Family Medicine

## 2022-06-12 ENCOUNTER — Ambulatory Visit: Payer: Medicaid Other | Admitting: Family Medicine

## 2022-06-12 VITALS — BP 163/94 | HR 91 | Ht 66.5 in | Wt 296.5 lb

## 2022-06-12 DIAGNOSIS — G43709 Chronic migraine without aura, not intractable, without status migrainosus: Secondary | ICD-10-CM | POA: Diagnosis not present

## 2022-06-12 MED ORDER — RIZATRIPTAN BENZOATE 10 MG PO TBDP: ORAL_TABLET | ORAL | 5 refills | Status: DC

## 2022-06-12 MED ORDER — EMGALITY 120 MG/ML ~~LOC~~ SOAJ
1.0000 | SUBCUTANEOUS | 3 refills | Status: DC
Start: 2022-06-12 — End: 2023-06-18

## 2022-06-13 ENCOUNTER — Other Ambulatory Visit: Payer: Self-pay

## 2022-06-13 ENCOUNTER — Ambulatory Visit (HOSPITAL_COMMUNITY)
Admission: RE | Admit: 2022-06-13 | Discharge: 2022-06-13 | Disposition: A | Discharge status: Home / Self Care | Payer: Medicaid Other | Source: Ambulatory Visit | Attending: Obstetrics & Gynecology | Admitting: Obstetrics & Gynecology

## 2022-06-13 ENCOUNTER — Other Ambulatory Visit: Payer: Self-pay | Admitting: Adult Health

## 2022-06-13 DIAGNOSIS — N939 Abnormal uterine and vaginal bleeding, unspecified: Secondary | ICD-10-CM | POA: Diagnosis not present

## 2022-06-13 DIAGNOSIS — R9389 Abnormal findings on diagnostic imaging of other specified body structures: Secondary | ICD-10-CM | POA: Diagnosis not present

## 2022-06-13 MED ORDER — MEGESTROL ACETATE 40 MG PO TABS: 40.0000 mg | ORAL_TABLET | Freq: Every day | ORAL | 1 refills | Status: DC

## 2022-06-13 NOTE — Progress Notes (Signed)
Refilled megace  

## 2022-06-18 NOTE — Progress Notes (Unsigned)
[address], [place] [address]   CLINIC:  Medical Oncology/Hematology  CONSULT NOTE  Patient Care Team: [name], [place] as PCP - General (Family Medicine) Sueanne Margarita, MD as PCP - Cardiology (Cardiology) Greg Cutter, LCSW as [place] [employer] (Licensed Clinical Social Worker) Melissa Montane, RN as Case Manager  CHIEF COMPLAINTS/PURPOSE OF CONSULTATION:  Thrombocytosis and iron deficiency anemia   HISTORY OF PRESENTING ILLNESS:   Kristin Baker 32 y.o. female is here at the request of her gynecologist (NP Derrek Monaco) for evaluation of thrombocytosis.    She has had progressively elevated platelets since February 2022.  Labs from 05/24/2022 show platelets 607 in the context of borderline anemia (Hgb 10.4) and borderline microcytosis (MCV 80).  Iron studies (05/06/2022) show iron deficiency with ferritin 18 and iron saturation 5%.  She follows closely with gynecology due to history of abnormal uterine bleeding, currently on megestrol. *** Bleeding *** ("bleeding nonstop since October 2023") *** She has been taking iron tablet *** daily since ***. *** Other bleeding? *** Fatigue *** Pica, RLS, headaches *** CP, DOE, LH, syncope  *** Aquagenic pruritus, erythromelalgia, Raynaud's, vasomotor symptoms. *** She denies any history of DVT or PE.  ***  PMH: ***  SOCIAL: ***  FAMILY: ***  MEDICAL HISTORY:  Past Medical History:  Diagnosis Date   Abdominal pain    ADHD    ANA positive    Back pain    DDD (degenerative disc disease), lumbar    Eye pain, right    Fatigue    Frontal sinusitis    Hypertension    Hypothyroidism    Obesity, morbid (HCC)    Palpitations    Rheumatoid arthritis ([place])     SURGICAL HISTORY: Past Surgical History:  Procedure Laterality Date   ABLATION     05/22/2020 left side, 06/14/2020 right side     SOCIAL HISTORY: Social History   Socioeconomic History    Marital status: Married    Spouse name: Not on file   Number of children: 1   Years of education: Not on file   Highest education level: Associate degree: academic program  Occupational History   Not on file  Tobacco Use   Smoking status: Former    Packs/day: 1.00    Years: 15.00    Total pack years: 15.00    Types: Cigarettes    Quit date: 11/24/2020    Years since quitting: 1.5   Smokeless tobacco: Never   Tobacco comments:    She has quit of and on.  Vaping Use   Vaping Use: Every day  Substance and Sexual Activity   Alcohol use: Yes    Comment: occasionally /social events   Drug use: Not Currently    Comment: smoked weed during teen years   Sexual activity: Yes    Birth control/protection: None, Abstinence  Other Topics Concern   Not on file  Social History Narrative   Married for May 2021.Lives with husband and daughter.Homemaker.Husband works at [employer].   Social Determinants of Health   Financial Resource Strain: Low Risk  (09/05/2020)   Overall Financial Resource Strain (CARDIA)    Difficulty of Paying Living Expenses: Not very hard  Food Insecurity: No Food Insecurity (06/11/2022)   Hunger Vital Sign    Worried About Running Out of Food in the Last Year: Never true    Ran Out of Food in the Last Year: Never true  Transportation Needs: No Transportation Needs (06/11/2022)   PRAPARE - Hydrologist (Medical): No    Lack of Transportation (Non-Medical): No  Physical Activity: Insufficiently Active (09/05/2020)   Exercise Vital Sign    Days of Exercise per Week: 3 days    Minutes of Exercise per Session: 30 min  Stress: No Stress Concern Present (11/07/2021)   [place]    Feeling of Stress : Only a little  Recent Concern: Stress - Stress Concern Present (09/07/2021)   [place]    Feeling of  Stress : To some extent  Social Connections: Moderately Isolated (09/05/2020)   Social Connection and Isolation Panel [NHANES]    Frequency of Communication with Friends and Family: More than three times a week    Frequency of Social Gatherings with Friends and Family: Twice a week    Attends Religious Services: Never    Marine scientist or Organizations: No    Attends Archivist Meetings: Never    Marital Status: Married  Human resources officer Violence: Not At Risk (07/03/2021)   Humiliation, Afraid, Rape, and Kick questionnaire    Fear of Current or Ex-Partner: No    Emotionally Abused: No    Physically Abused: No    Sexually Abused: No    FAMILY HISTORY: Family History  Problem Relation Age of Onset   Cervical cancer Maternal Grandmother    Breast cancer Maternal Grandmother    Skin cancer Maternal Grandmother    Heart attack Maternal Grandfather    Diabetes Father    Heart attack Father    Hyperlipidemia Mother    Peripheral Artery Disease Mother    ADD / ADHD Mother    Bipolar disorder Mother    Cervical cancer Mother        at age 25.   ADD / ADHD Sister    Healthy Daughter    Breast cancer Maternal Aunt    Cervical cancer Maternal Aunt    ADD / ADHD Maternal Aunt    Uterine cancer Maternal Aunt    Breast cancer Maternal Aunt    Multiple sclerosis Paternal Aunt    Colon cancer Neg Hx     ALLERGIES:  is allergic to poison ivy extract, poison oak extract, flexeril [cyclobenzaprine], lactose intolerance (gi), honeysuckle flower [lonicera], and mixed grasses.  MEDICATIONS:  Current Outpatient Medications  Medication Sig Dispense Refill   amphetamine-dextroamphetamine (ADDERALL XR) 20 MG 24 hr capsule Take 20 mg by mouth daily.     azelastine (ASTELIN) 0.1 % nasal spray 2 sprays per nostril 1-2 times daily as needed. 30 mL 5   Cholecalciferol (VITAMIN D3) 25 MCG (1000 UT) CAPS Take 1 capsule (1,000 Units total) by mouth daily. 30 capsule 3   fluticasone  (FLOVENT HFA) 110 MCG/ACT inhaler Inhale 2 puffs into the lungs 2 (two) times daily. With spacer. 1 each 5   Galcanezumab-gnlm (EMGALITY) 120 MG/ML SOAJ Inject 1 pen  into the skin every 30 (thirty) days. 3 mL 3   hydrochlorothiazide (HYDRODIURIL) 12.5 MG tablet TAKE 1 TABLET(12.5 MG) BY MOUTH DAILY 90 tablet 0   ibuprofen (ADVIL) 800 MG tablet Take 800 mg by mouth every 8 (eight) hours as needed for moderate pain.     levocetirizine (XYZAL) 5 MG tablet Take 1 tablet (5 mg total) by mouth in the morning and at bedtime. 60 tablet 5   levothyroxine (SYNTHROID) 112  MCG tablet Take 1 tablet (112 mcg total) by mouth daily. 90 tablet 3   megestrol (MEGACE) 40 MG tablet Take 1 tablet (40 mg total) by mouth daily. 30 tablet 1   meloxicam (MOBIC) 15 MG tablet Take 1 tablet (15 mg total) by mouth daily. 30 tablet 0   rizatriptan (MAXALT-MLT) 10 MG disintegrating tablet DISSOLVE ONE TABLET BY MOUTH AS NEEDED FOR MIGRAINE. MAY REPEAT IN 2 HOURS IF NEEDED. NO MORE THAN 2 TABLETS DAILY OR 10 TABLETS A MONTH. 10 tablet 5   tiZANidine (ZANAFLEX) 4 MG tablet Take 4 mg by mouth every 6 (six) hours as needed for muscle spasms.     triamcinolone (NASACORT) 55 MCG/ACT AERO nasal inhaler Place 1 spray into the nose daily. 16.9 mL 5   VENTOLIN HFA 108 (90 Base) MCG/ACT inhaler INHALE 2 PUFFS INTO THE LUNGS EVERY 4 HOURS AS NEEDED FOR WHEEZING OR SHORTNESS OF BREATH OR COUGH 18 g 1   No current facility-administered medications for this visit.    REVIEW OF SYSTEMS:  ***  Review of Systems - Oncology    PHYSICAL EXAMINATION:   ECOG PERFORMANCE STATUS: {CHL ONC ECOG OT:[id number]} *** There were no vitals filed for this visit. There were no vitals filed for this visit.  Physical Exam    LABORATORY DATA:  I have reviewed the data as listed Recent Results (from the past 2160 hour(s))  CBC     Status: Abnormal   Collection Time: 04/11/22  2:52 PM  Result Value Ref Range   WBC 10.5 3.4 - 10.8 x10E3/uL   RBC  4.34 3.77 - 5.28 x10E6/uL   Hemoglobin 11.4 11.1 - 15.9 g/dL   Hematocrit 97.4 16.3 - 46.6 %   MCV 81 79 - 97 fL   MCH 26.3 (L) 26.6 - 33.0 pg   MCHC 32.3 31.5 - 35.7 g/dL   RDW 84.5 36.4 - 68.0 %   Platelets 502 (H) 150 - 450 x10E3/uL  Iron, TIBC and Ferritin Panel     Status: Abnormal   Collection Time: 04/11/22  2:52 PM  Result Value Ref Range   Total Iron Binding Capacity 334 250 - 450 ug/dL   UIBC 321 224 - 825 ug/dL   Iron 22 (L) 27 - 003 ug/dL   Iron Saturation 7 (LL) 15 - 55 %   Ferritin 21 15 - 150 ng/mL  Beta hCG quant (ref lab)     Status: None   Collection Time: 04/11/22  2:52 PM  Result Value Ref Range   hCG Quant <1 mIU/mL    Comment:                      Female (Non-pregnant)    0 -     5                             (Postmenopausal)  0 -     8                      Female (Pregnant)                      Weeks of Gestation                              3  6 -    71                              4               10 -   750                              5              217 -  7138                              6              158 - 31795                              7             3697 -163563                              8            32065 -149571                              9            63803 -151410                             10            46509 -186977                             12            27832 -210612                             14            13950 - 62530                             15            12039 - 70971                             16             9040 - 56451                             17             8175 - 55868                             18             8099 - 58176 Roche E CLIA methodology   TSH + free T4     Status: None   Collection Time:  04/26/22  8:20 AM  Result Value Ref Range   TSH 2.530 0.450 - 4.500 uIU/mL   Free T4 1.13 0.82 - 1.77 ng/dL  VITAMIN D 25 Hydroxy (Vit-D Deficiency, Fractures)     Status: None   Collection  Time: 04/26/22  8:20 AM  Result Value Ref Range   Vit D, 25-Hydroxy 38.7 30.0 - 100.0 ng/mL    Comment: Vitamin D deficiency has been defined by the Institute of Medicine and an Endocrine Society practice guideline as a level of serum 25-OH vitamin D less than 20 ng/mL (1,2). The Endocrine Society went on to further define vitamin D insufficiency as a level between 21 and 29 ng/mL (2). 1. IOM (Institute of Medicine). 2010. Dietary reference    intakes for calcium and D. [place]: The    [employer]. 2. Holick MF, Binkley Whitehall, Bischoff-Ferrari HA, et al.    Evaluation, treatment, and prevention of vitamin D    deficiency: an Endocrine Society clinical practice    guideline. JCEM. 2011 Jul; 96(7):1911-30.   Lipid Profile     Status: Abnormal   Collection Time: 04/26/22  8:20 AM  Result Value Ref Range   Cholesterol, Total 198 100 - 199 mg/dL   Triglycerides 008 0 - 149 mg/dL   HDL 38 (L) >67 mg/dL   VLDL Cholesterol Cal 23 5 - 40 mg/dL   LDL Chol Calc (NIH) 619 (H) 0 - 99 mg/dL   Chol/HDL Ratio 5.2 (H) 0.0 - 4.4 ratio    Comment:                                   T. Chol/HDL Ratio                                             Men  Women                               1/2 Avg.Risk  3.4    3.3                                   Avg.Risk  5.0    4.4                                2X Avg.Risk  9.6    7.1                                3X Avg.Risk 23.4   11.0   Hemoglobin A1C     Status: Abnormal   Collection Time: 04/26/22  8:20 AM  Result Value Ref Range   Hgb A1c MFr Bld 5.7 (H) 4.8 - 5.6 %    Comment:          Prediabetes: 5.7 - 6.4          Diabetes: >6.4          Glycemic control for adults with diabetes: <7.0    Est. average glucose Bld gHb Est-mCnc 117 mg/dL  [id number]     Status: None  Collection Time: 04/26/22  8:20 AM  Result Value Ref Range   Glucose 88 70 - 99 mg/dL   BUN 8 6 - 20 mg/dL   Creatinine, Ser 1.610.76 0.57 - 1.00 mg/dL   eGFR 096107 >04>59  VW/UJW/1.19mL/min/1.73   BUN/Creatinine Ratio 11 9 - 23   Sodium 137 134 - 144 mmol/L   Potassium 3.8 3.5 - 5.2 mmol/L   Chloride 100 96 - 106 mmol/L   CO2 21 20 - 29 mmol/L   Calcium 9.3 8.7 - 10.2 mg/dL   Total Protein 6.9 6.0 - 8.5 g/dL   Albumin 4.3 3.9 - 4.9 g/dL   Globulin, Total 2.6 1.5 - 4.5 g/dL   Albumin/Globulin Ratio 1.7 1.2 - 2.2   Bilirubin Total <0.2 0.0 - 1.2 mg/dL   Alkaline Phosphatase 71 44 - 121 IU/L   AST 11 0 - 40 IU/L   ALT 11 0 - 32 IU/L  CBC     Status: Abnormal   Collection Time: 05/06/22 12:42 PM  Result Value Ref Range   WBC 9.3 3.4 - 10.8 x10E3/uL   RBC 4.17 3.77 - 5.28 x10E6/uL   Hemoglobin 10.8 (L) 11.1 - 15.9 g/dL   Hematocrit 14.733.6 (L) 82.934.0 - 46.6 %   MCV 81 79 - 97 fL   MCH 25.9 (L) 26.6 - 33.0 pg   MCHC 32.1 31.5 - 35.7 g/dL   RDW 56.213.5 13.011.7 - 86.515.4 %   Platelets 532 (H) 150 - 450 x10E3/uL  Iron, TIBC and Ferritin Panel     Status: Abnormal   Collection Time: 05/06/22 12:42 PM  Result Value Ref Range   Total Iron Binding Capacity 365 250 - 450 ug/dL   UIBC 784345 696131 - 295425 ug/dL   Iron 20 (L) 27 - 284159 ug/dL   Iron Saturation 5 (LL) 15 - 55 %   Ferritin 18 15 - 150 ng/mL  POCT hemoglobin     Status: Abnormal   Collection Time: 05/24/22 12:03 PM  Result Value Ref Range   Hemoglobin 7.7 (A) 11 - 14.6 g/dL  CBC     Status: Abnormal   Collection Time: 05/24/22 12:16 PM  Result Value Ref Range   WBC 10.2 3.4 - 10.8 x10E3/uL   RBC 4.22 3.77 - 5.28 x10E6/uL   Hemoglobin 10.4 (L) 11.1 - 15.9 g/dL   Hematocrit 13.233.7 (L) 44.034.0 - 46.6 %   MCV 80 79 - 97 fL   MCH 24.6 (L) 26.6 - 33.0 pg   MCHC 30.9 (L) 31.5 - 35.7 g/dL   RDW 10.214.1 72.511.7 - 36.615.4 %   Platelets 607 (H) 150 - 450 x10E3/uL  POCT urine pregnancy     Status: None   Collection Time: 06/05/22  9:17 AM  Result Value Ref Range   Preg Test, Ur Negative Negative    RADIOGRAPHIC STUDIES: I have personally reviewed the radiological images as listed and agreed with the findings in the report. US PELVIC  COMPLETE WITH TRANSVAGINAL  Result Date: 06/13/2022 CLINICAL DATA:  Abnormal uterine bleeding, bleeding nonstop for 3 months, LMP 02/24/2022 EXAM: TRANSABDOMINAL AND TRANSVAGINAL ULTRASOUND OF PELVIS TECHNIQUE: Both transabdominal and transvaginal ultrasound examinations of the pelvis were performed. Transabdominal technique was performed for global imaging of the pelvis including uterus, ovaries, adnexal regions, and pelvic cul-de-sac. It was necessary to proceed with endovaginal exam following the transabdominal exam to visualize the endometrium and lower uterus. COMPARISON:  None Available. FINDINGS: Uterus Measurements: 11.3 x 5.5 x 7.2 cm = volume: 233 mL.  Anteverted. Heterogeneous myometrium. Normal morphology without mass. Endometrium Thickness: 17 mm. IUD identified within endometrial canal, located at the lower to mid uterine segments; the upper aspect of the IUD is approximately 2.2 cm from the fundal extent of the endometrial complex, low in position. Right ovary Measurements: 3.2 x 2.3 x 2.9 cm = volume: 10.7 mL. Normal morphology without mass Left ovary Measurements: 1.7 x 1.5 x 1.7 cm = volume: 2.2 mL. Normal morphology without mass Other findings No free pelvic fluid or adnexal masses. IMPRESSION: IUD identified within endometrial canal, low in position, approximately 2.2 cm from the fundal extent of the endometrial complex. Thickened endometrial complex 17 mm thick; if bleeding remains unresponsive to hormonal or medical therapy, focal lesion work-up with sonohysterogram should be considered. Endometrial biopsy should also be considered in pre-menopausal patients at high risk for endometrial carcinoma. (Ref: Radiological Reasoning: Algorithmic Workup of Abnormal Vaginal Bleeding with Endovaginal Sonography and Sonohysterography. AJR 2008; 161:W96-04; 191:S68-73) Electronically Signed   By: Ulyses SouthwardMark  Boles M.D.   On: 06/13/2022 13:59     ASSESSMENT & PLAN:  1.  Thrombocytosis - *** - PLAN: ***   2.  Iron  deficiency anemia - *** - PLAN: ***   ***.  Other history - PMH: *** - SOCIAL: *** - FAMILY: ***   PLAN SUMMARY: >> *** >> *** >> ***   All questions were answered. The patient knows to call the clinic with any problems, questions or concerns.  Medical decision making: ***  Time spent on visit: I spent *** minutes counseling the patient face to face. The total time spent in the appointment was *** minutes and more than 50% was on counseling.  I, Rojelio Brennerebekah Pennington PA-C, have seen this patient in conjunction with Dr. Doreatha MassedSreedhar Lelani Garnett.  Greater than 50% of visit was performed by Dr. Ellin SabaKatragadda.   Carnella Guadalajaraebekah M Pennington, PA-C *** ***  DR. Ellin SabaKATRAGADDAMarland Kitchen: ***

## 2022-06-19 ENCOUNTER — Other Ambulatory Visit: Payer: Self-pay

## 2022-06-19 ENCOUNTER — Inpatient Hospital Stay: Payer: Medicaid Other | Attending: Hematology | Admitting: Hematology

## 2022-06-19 ENCOUNTER — Encounter: Payer: Self-pay | Admitting: Physician Assistant

## 2022-06-19 ENCOUNTER — Encounter: Payer: Medicaid Other | Admitting: Obstetrics & Gynecology

## 2022-06-19 ENCOUNTER — Inpatient Hospital Stay: Payer: Medicaid Other

## 2022-06-19 DIAGNOSIS — K5909 Other constipation: Secondary | ICD-10-CM | POA: Diagnosis not present

## 2022-06-19 DIAGNOSIS — D509 Iron deficiency anemia, unspecified: Secondary | ICD-10-CM | POA: Insufficient documentation

## 2022-06-19 DIAGNOSIS — I10 Essential (primary) hypertension: Secondary | ICD-10-CM | POA: Insufficient documentation

## 2022-06-19 DIAGNOSIS — Z8041 Family history of malignant neoplasm of ovary: Secondary | ICD-10-CM

## 2022-06-19 DIAGNOSIS — D500 Iron deficiency anemia secondary to blood loss (chronic): Secondary | ICD-10-CM

## 2022-06-19 DIAGNOSIS — D75839 Thrombocytosis, unspecified: Secondary | ICD-10-CM | POA: Diagnosis not present

## 2022-06-19 DIAGNOSIS — K649 Unspecified hemorrhoids: Secondary | ICD-10-CM | POA: Diagnosis not present

## 2022-06-19 DIAGNOSIS — Z803 Family history of malignant neoplasm of breast: Secondary | ICD-10-CM

## 2022-06-19 DIAGNOSIS — E039 Hypothyroidism, unspecified: Secondary | ICD-10-CM

## 2022-06-19 DIAGNOSIS — Z8049 Family history of malignant neoplasm of other genital organs: Secondary | ICD-10-CM

## 2022-06-19 HISTORY — DX: Iron deficiency anemia secondary to blood loss (chronic): D50.0

## 2022-06-19 LAB — CBC WITH DIFFERENTIAL/PLATELET
Abs Immature Granulocytes: 0.03 10*3/uL (ref 0.00–0.07)
Basophils Absolute: 0.1 10*3/uL (ref 0.0–0.1)
Basophils Relative: 1 %
Eosinophils Absolute: 0.1 10*3/uL (ref 0.0–0.5)
Eosinophils Relative: 1 %
HCT: 32.3 % — ABNORMAL LOW (ref 36.0–46.0)
Hemoglobin: 10.3 g/dL — ABNORMAL LOW (ref 12.0–15.0)
Immature Granulocytes: 0 %
Lymphocytes Relative: 25 %
Lymphs Abs: 2.7 10*3/uL (ref 0.7–4.0)
MCH: 23.4 pg — ABNORMAL LOW (ref 26.0–34.0)
MCHC: 31.9 g/dL (ref 30.0–36.0)
MCV: 73.2 fL — ABNORMAL LOW (ref 80.0–100.0)
Monocytes Absolute: 0.6 10*3/uL (ref 0.1–1.0)
Monocytes Relative: 5 %
Neutro Abs: 7.2 10*3/uL (ref 1.7–7.7)
Neutrophils Relative %: 68 %
Platelets: 580 10*3/uL — ABNORMAL HIGH (ref 150–400)
RBC: 4.41 MIL/uL (ref 3.87–5.11)
RDW: 14.6 % (ref 11.5–15.5)
WBC: 10.7 10*3/uL — ABNORMAL HIGH (ref 4.0–10.5)
nRBC: 0 % (ref 0.0–0.2)

## 2022-06-19 LAB — IRON AND TIBC
Iron: 17 ug/dL — ABNORMAL LOW (ref 28–170)
Saturation Ratios: 4 % — ABNORMAL LOW (ref 10.4–31.8)
TIBC: 437 ug/dL (ref 250–450)
UIBC: 420 ug/dL

## 2022-06-19 LAB — C-REACTIVE PROTEIN: CRP: 2.2 mg/dL — ABNORMAL HIGH (ref <1.0)

## 2022-06-19 LAB — FERRITIN: Ferritin: 10 ng/mL — ABNORMAL LOW (ref 11–307)

## 2022-06-19 LAB — SEDIMENTATION RATE: Sed Rate: 54 mm/hr — ABNORMAL HIGH (ref 0–22)

## 2022-06-19 NOTE — Patient Instructions (Signed)
[facility] **   You were seen today by Dr. Delton Coombes & Tarri Abernethy PA-C for your iron deficiency anemia and elevated platelets.    ELEVATED PLATELETS Your elevated platelets may be related to your iron deficiency, your vaping, chronic inflammation, or your obesity. We will check labs today to look for levels of inflammation or any genetic mutation that could also cause high platelets.  IRON DEFICIENCY ANEMIA Iron deficiency anemia is likely related to your abnormal uterine bleeding.  Continue to follow-up with gynecology for treatment of abnormal bleeding. Your anemia may also be related to your hemorrhoid bleeding.  In the future, I would recommend referral to gastroenterology for hemorrhoid banding and treatment of your chronic constipation. Continue to take iron tablet daily. Due to the severity of your iron deficiency and your significant symptoms, I recommend IV iron x 2 doses.  Please see the attached handout for important information regarding side effects of IV iron.    FAMILY HISTORY OF CANCER We will refer you to genetic counselor to see if you have any inherited risk for cancer.  FOLLOW-UP APPOINTMENT: I will see you for same-day labs and office visit in 8 weeks.  ** Thank you for trusting me with your healthcare!  I strive to provide all of my patients with quality care at each visit.  If you receive a survey for this visit, I would be so grateful to you for taking the time to provide feedback.  Thank you in advance!  ~ Valaria Kohut                   Dr. Derek Jack   &   Tarri Abernethy, PA-C   - - - - - - - - - - - - - - - - - -    Thank you for choosing [facility] at [facility] to provide your oncology and hematology care.  To afford each patient quality time with our provider, please arrive at least 15 minutes before your scheduled appointment time.   If  you have a lab appointment with the [facility] please come in thru the Main Entrance and check in at the main information desk.  You need to re-schedule your appointment should you arrive 10 or more minutes late.  We strive to give you quality time with our providers, and arriving late affects you and other patients whose appointments are after yours.  Also, if you no show three or more times for appointments you may be dismissed from the clinic at the providers discretion.     Again, thank you for choosing [facility].  Our hope is that these requests will decrease the amount of time that you wait before being seen by our physicians.       _____________________________________________________________  Should you have questions after your visit to [facility], please contact our office at [phone] and follow the prompts.  Our office hours are 8:00 a.m. and 4:30 p.m. Monday - Friday.  Please note that voicemails left after 4:00 p.m. may not be returned until the following business day.  We are closed weekends and major holidays.  You do have access to a nurse 24-7, just call the main number to the clinic [phone] and do not press any options, hold on the line and a nurse will answer the phone.    For prescription refill requests, have your  pharmacy contact our office and allow 72 hours.

## 2022-06-20 ENCOUNTER — Ambulatory Visit (INDEPENDENT_AMBULATORY_CARE_PROVIDER_SITE_OTHER): Payer: Medicaid Other | Admitting: Obstetrics & Gynecology

## 2022-06-20 ENCOUNTER — Encounter: Payer: Self-pay | Admitting: Obstetrics & Gynecology

## 2022-06-20 VITALS — BP 151/103 | HR 104 | Ht 66.0 in | Wt 291.0 lb

## 2022-06-20 DIAGNOSIS — N939 Abnormal uterine and vaginal bleeding, unspecified: Secondary | ICD-10-CM | POA: Diagnosis not present

## 2022-06-20 DIAGNOSIS — R102 Pelvic and perineal pain: Secondary | ICD-10-CM

## 2022-06-20 DIAGNOSIS — D500 Iron deficiency anemia secondary to blood loss (chronic): Secondary | ICD-10-CM | POA: Diagnosis not present

## 2022-06-20 DIAGNOSIS — N946 Dysmenorrhea, unspecified: Secondary | ICD-10-CM

## 2022-06-20 LAB — ANA: Anti Nuclear Antibody (ANA): NEGATIVE

## 2022-06-20 NOTE — Progress Notes (Signed)
Follow up appointment for results: sonogram  Chief Complaint  Patient presents with   Pre-op Exam    Discuss hysterectomy    Blood pressure (!) 151/103, pulse (!) 104, height 5\' 6"  (1.676 m), weight 291 lb (132 kg).  CLINICAL DATA:  Abnormal uterine bleeding, bleeding nonstop for 3 months, LMP 02/24/2022   EXAM: TRANSABDOMINAL AND TRANSVAGINAL ULTRASOUND OF PELVIS   TECHNIQUE: Both transabdominal and transvaginal ultrasound examinations of the pelvis were performed. Transabdominal technique was performed for global imaging of the pelvis including uterus, ovaries, adnexal regions, and pelvic cul-de-sac. It was necessary to proceed with endovaginal exam following the transabdominal exam to visualize the endometrium and lower uterus.   COMPARISON:  None Available.   FINDINGS: Uterus   Measurements: 11.3 x 5.5 x 7.2 cm = volume: 233 mL. Anteverted. Heterogeneous myometrium. Normal morphology without mass.   Endometrium   Thickness: 17 mm. IUD identified within endometrial canal, located at the lower to mid uterine segments; the upper aspect of the IUD is approximately 2.2 cm from the fundal extent of the endometrial complex, low in position.   Right ovary   Measurements: 3.2 x 2.3 x 2.9 cm = volume: 10.7 mL. Normal morphology without mass   Left ovary   Measurements: 1.7 x 1.5 x 1.7 cm = volume: 2.2 mL. Normal morphology without mass   Other findings   No free pelvic fluid or adnexal masses.   IMPRESSION: IUD identified within endometrial canal, low in position, approximately 2.2 cm from the fundal extent of the endometrial complex.   Thickened endometrial complex 17 mm thick; if bleeding remains unresponsive to hormonal or medical therapy, focal lesion work-up with sonohysterogram should be considered. Endometrial biopsy should also be considered in pre-menopausal patients at high risk for endometrial carcinoma. (Ref: Radiological Reasoning:  Algorithmic Workup of Abnormal Vaginal Bleeding with Endovaginal Sonography and Sonohysterography. AJR 2008; 147:W29-56)     Electronically Signed   By: Ulyses Southward M.D.   On: 06/13/2022 13:59  MEDS ordered this encounter: No orders of the defined types were placed in this encounter.   Orders for this encounter: No orders of the defined types were placed in this encounter.   Impression + Management Plan   ICD-10-CM   1. Abnormal uterine bleeding  N93.9     2. Iron deficiency anemia due to chronic blood loss  D50.0     3. Dysmenorrhea  N94.6     4. Pelvic pain  R10.2     Given the patient's abnormal uterine bleeding with some degree of chronicity of her pelvic pain with associated dysmenorrhea and her iron deficiency anemia uterine size of approximately 233 cc we discussed option of endometrial ablation versus hysterectomy.  Of course with the chronicity of her pain and the significance of her anemia which we can manage in the short-term with the manage of her abnormal uterine bleeding with progesterone only but that is not a 20-year solution.  After discussing the options the patient is opted for robotic assisted laparoscopic hysterectomy with bilateral salpingectomy preparations will be made for that and scheduling as it allows  Follow Up: Return in about 7 weeks (around 08/09/2022) for Post Op, with Dr Despina Hidden.     All questions were answered.  Past Medical History:  Diagnosis Date   Abdominal pain    ADHD    ANA positive    Back pain    DDD (degenerative disc disease), lumbar    Eye pain, right    Fatigue  Frontal sinusitis    Hypertension    Hypothyroidism    Iron deficiency anemia due to chronic blood loss 06/19/2022   Medical history non-contributory    Obesity, morbid (HCC)    Palpitations    Rheumatoid arthritis [facility])     Past Surgical History:  Procedure Laterality Date   ABLATION     05/22/2020 left side, 06/14/2020 right side    HYSTERECTOMY  ABDOMINAL WITH SALPINGECTOMY     ROBOTIC ASSISTED TOTAL HYSTERECTOMY Bilateral 07/30/2022   Procedure: XI ROBOTIC ASSISTED TOTAL HYSTERECTOMY AND BILATERAL SALPINGECTOMY;  Surgeon: Lazaro Arms, MD;  Location: [facility];  Service: Gynecology;  Laterality: Bilateral;    OB History     Gravida  2   Para  1   Term  1   Preterm      AB  1   Living  1      SAB      IAB      Ectopic      Multiple      Live Births              Allergies  Allergen Reactions   Poison Ivy Extract Anaphylaxis   Poison Oak Extract Anaphylaxis   Flexeril [Cyclobenzaprine] Hives   Lactose Intolerance (Gi) Diarrhea   Honeysuckle Flower [Lonicera] Rash   Mixed Grasses Rash    Social History   Socioeconomic History   Marital status: Married    Spouse name: Not on file   Number of children: 1   Years of education: Not on file   Highest education level: Associate degree: academic program  Occupational History   Not on file  Tobacco Use   Smoking status: Former    Packs/day: 1.00    Years: 15.00    Additional pack years: 0.00    Total pack years: 15.00    Types: Cigarettes    Quit date: 11/24/2020    Years since quitting: 1.8   Smokeless tobacco: Never   Tobacco comments:    She has quit of and on.  Vaping Use   Vaping Use: Every day  Substance and Sexual Activity   Alcohol use: Yes    Comment: occasionally /social events   Drug use: Not Currently    Comment: smoked weed during teen years   Sexual activity: Not Currently    Birth control/protection: Surgical    Comment: hyst  Other Topics Concern   Not on file  Social History Narrative   Married for May 2021.Lives with husband and daughter.Homemaker.Husband works at [employer].   Social Determinants of Health   Financial Resource Strain: Low Risk  (09/05/2020)   Overall Financial Resource Strain (CARDIA)    Difficulty of Paying Living Expenses: Not very hard  Food Insecurity: No Food Insecurity (07/29/2022)    Hunger Vital Sign    Worried About Running Out of Food in the Last Year: Never true    Ran Out of Food in the Last Year: Never true  Transportation Needs: No Transportation Needs (07/29/2022)   PRAPARE - Administrator, Civil Service (Medical): No    Lack of Transportation (Non-Medical): No  Physical Activity: Insufficiently Active (09/05/2020)   Exercise Vital Sign    Days of Exercise per Week: 3 days    Minutes of Exercise per Session: 30 min  Stress: No Stress Concern Present (11/07/2021)   [employer] of Occupational Health - Occupational Stress Questionnaire    Feeling of Stress : Only a little  Recent Concern: Stress - Stress Concern Present (09/07/2021)   [employer] of Occupational Health - Occupational Stress Questionnaire    Feeling of Stress : To some extent  Social Connections: Moderately Isolated (09/05/2020)   Social Connection and Isolation Panel [NHANES]    Frequency of Communication with Friends and Family: More than three times a week    Frequency of Social Gatherings with Friends and Family: Twice a week    Attends Religious Services: Never    Database administrator or Organizations: No    Attends Engineer, structural: Never    Marital Status: Married    Family History  Problem Relation Age of Onset   Cervical cancer Maternal Grandmother    Breast cancer Maternal Grandmother    Skin cancer Maternal Grandmother    Heart attack Maternal Grandfather    Diabetes Father    Heart attack Father    Hyperlipidemia Mother    Peripheral Artery Disease Mother    ADD / ADHD Mother    Bipolar disorder Mother    Cervical cancer Mother        at age 43.   ADD / ADHD Sister    Healthy Daughter    Breast cancer Maternal Aunt    Cervical cancer Maternal Aunt    ADD / ADHD Maternal Aunt    Uterine cancer Maternal Aunt    Breast cancer Maternal Aunt    Multiple sclerosis Paternal Aunt    Colon cancer Neg Hx

## 2022-06-21 LAB — RHEUMATOID FACTOR: Rheumatoid fact SerPl-aCnc: <10 IU/mL (ref <14.0)

## 2022-06-24 LAB — BCR-ABL1 FISH
Cells Analyzed: 200
Cells Counted: 200

## 2022-06-25 ENCOUNTER — Inpatient Hospital Stay: Payer: Medicaid Other

## 2022-06-25 VITALS — BP 143/49 | HR 91 | Temp 98.8°F | Resp 18

## 2022-06-25 DIAGNOSIS — D500 Iron deficiency anemia secondary to blood loss (chronic): Secondary | ICD-10-CM

## 2022-06-25 DIAGNOSIS — D509 Iron deficiency anemia, unspecified: Secondary | ICD-10-CM | POA: Diagnosis not present

## 2022-06-25 MED ORDER — ACETAMINOPHEN 325 MG PO TABS
650.0000 mg | ORAL_TABLET | Freq: Once | ORAL | Status: AC
  Administered 2022-06-25: 650 mg via ORAL
  Filled 2022-06-25: qty 2

## 2022-06-25 MED ORDER — SODIUM CHLORIDE 0.9 % IV SOLN
510.0000 mg | Freq: Once | INTRAVENOUS | Status: AC
  Administered 2022-06-25: 510 mg via INTRAVENOUS
  Filled 2022-06-25: qty 510

## 2022-06-25 MED ORDER — LORATADINE 10 MG PO TABS
10.0000 mg | ORAL_TABLET | Freq: Once | ORAL | Status: DC
  Filled 2022-06-25: qty 1

## 2022-06-25 MED ORDER — SODIUM CHLORIDE 0.9 % IV SOLN: Freq: Once | INTRAVENOUS | Status: AC

## 2022-06-25 NOTE — Progress Notes (Signed)
Pt presents today for Feraheme IV iron per provider's order.Vital signs stable and pt voiced no new complaints at this time.  Pt took Xyzal at home prior to arrival. Peripheral IV started with good blood return pre and post infusion.  Feraheme given today per MD orders. Tolerated infusion without adverse affects. Vital signs stable. No complaints at this time. Discharged from clinic ambulatory in stable condition. Alert and oriented x 3. F/U with [facility] as scheduled.

## 2022-06-25 NOTE — Patient Instructions (Signed)
[facility]  Discharge Instructions: Thank you for choosing [facility] to provide your oncology and hematology care.  If you have a lab appointment with the [facility], please come in thru the Main Entrance and check in at the main information desk.  Wear comfortable clothing and clothing appropriate for easy access to any Portacath or PICC line.   We strive to give you quality time with your provider. You may need to reschedule your appointment if you arrive late (15 or more minutes).  Arriving late affects you and other patients whose appointments are after yours.  Also, if you miss three or more appointments without notifying the office, you may be dismissed from the clinic at the provider's discretion.      For prescription refill requests, have your pharmacy contact our office and allow 72 hours for refills to be completed.    Today you received the following chemotherapy and/or immunotherapy agents Feraheme IV iron infusion.     BELOW ARE SYMPTOMS THAT SHOULD BE REPORTED IMMEDIATELY: *FEVER GREATER THAN 100.4 F (38 C) OR HIGHER *CHILLS OR SWEATING *NAUSEA AND VOMITING THAT IS NOT CONTROLLED WITH YOUR NAUSEA MEDICATION *UNUSUAL SHORTNESS OF BREATH *UNUSUAL BRUISING OR BLEEDING *URINARY PROBLEMS (pain or burning when urinating, or frequent urination) *BOWEL PROBLEMS (unusual diarrhea, constipation, pain near the anus) TENDERNESS IN MOUTH AND THROAT WITH OR WITHOUT PRESENCE OF ULCERS (sore throat, sores in mouth, or a toothache) UNUSUAL RASH, SWELLING OR PAIN  UNUSUAL VAGINAL DISCHARGE OR ITCHING   Items with * indicate a potential emergency and should be followed up as soon as possible or go to the Emergency Department if any problems should occur.  Please show the CHEMOTHERAPY ALERT CARD or IMMUNOTHERAPY ALERT CARD at check-in to the Emergency Department and triage nurse.  Should you have questions after your visit or need to cancel or  reschedule your appointment, please contact [facility] [phone]  and follow the prompts.  Office hours are 8:00 a.m. to 4:30 p.m. Monday - Friday. Please note that voicemails left after 4:00 p.m. may not be returned until the following business day.  We are closed weekends and major holidays. You have access to a nurse at all times for urgent questions. Please call the main number to the clinic [phone] and follow the prompts.  For any non-urgent questions, you may also contact your provider using MyChart. We now offer e-Visits for anyone 15 and older to request care online for non-urgent symptoms. For details visit [email or web address].   Also download the MyChart app! Go to the app store, search "MyChart", open the app, select [facility], and log in with your MyChart username and password.

## 2022-06-27 LAB — JAK2 V617F RFX CALR/MPL/E12-15

## 2022-06-27 LAB — CALR +MPL + E12-E15  (REFLEX)

## 2022-07-03 ENCOUNTER — Inpatient Hospital Stay: Payer: Medicaid Other | Attending: Hematology

## 2022-07-03 VITALS — BP 141/77 | HR 110 | Temp 99.0°F | Resp 20

## 2022-07-03 DIAGNOSIS — D500 Iron deficiency anemia secondary to blood loss (chronic): Secondary | ICD-10-CM

## 2022-07-03 DIAGNOSIS — D509 Iron deficiency anemia, unspecified: Secondary | ICD-10-CM | POA: Diagnosis present

## 2022-07-03 MED ORDER — LORATADINE 10 MG PO TABS: 10.0000 mg | ORAL_TABLET | Freq: Once | ORAL | Status: DC

## 2022-07-03 MED ORDER — SODIUM CHLORIDE 0.9 % IV SOLN: Freq: Once | INTRAVENOUS | Status: AC

## 2022-07-03 MED ORDER — ACETAMINOPHEN 325 MG PO TABS
650.0000 mg | ORAL_TABLET | Freq: Once | ORAL | Status: AC
  Administered 2022-07-03: 650 mg via ORAL
  Filled 2022-07-03: qty 2

## 2022-07-03 MED ORDER — SODIUM CHLORIDE 0.9 % IV SOLN
510.0000 mg | Freq: Once | INTRAVENOUS | Status: AC
  Administered 2022-07-03: 510 mg via INTRAVENOUS
  Filled 2022-07-03: qty 510

## 2022-07-03 NOTE — Progress Notes (Signed)
Pt presents today for Feraheme IV iron infusion per provider's order. Vital signs stable and pt voiced no new complaints at this time.  Pt took Xyzal at home prior to arrival. Peripheral IV started with good blood return pre and post infusion.  Feraheme given today per MD orders. Tolerated infusion without adverse affects. Vital signs stable. No complaints at this time. Discharged from clinic ambulatory in stable condition. Alert and oriented x 3. F/U with [facility] as scheduled.

## 2022-07-03 NOTE — Patient Instructions (Signed)
[facility]  Discharge Instructions: Thank you for choosing [facility] to provide your oncology and hematology care.  If you have a lab appointment with the [facility], please come in thru the Main Entrance and check in at the main information desk.  Wear comfortable clothing and clothing appropriate for easy access to any Portacath or PICC line.   We strive to give you quality time with your provider. You may need to reschedule your appointment if you arrive late (15 or more minutes).  Arriving late affects you and other patients whose appointments are after yours.  Also, if you miss three or more appointments without notifying the office, you may be dismissed from the clinic at the provider's discretion.      For prescription refill requests, have your pharmacy contact our office and allow 72 hours for refills to be completed.    Today you received Feraheme IV infusion.     BELOW ARE SYMPTOMS THAT SHOULD BE REPORTED IMMEDIATELY: *FEVER GREATER THAN 100.4 F (38 C) OR HIGHER *CHILLS OR SWEATING *NAUSEA AND VOMITING THAT IS NOT CONTROLLED WITH YOUR NAUSEA MEDICATION *UNUSUAL SHORTNESS OF BREATH *UNUSUAL BRUISING OR BLEEDING *URINARY PROBLEMS (pain or burning when urinating, or frequent urination) *BOWEL PROBLEMS (unusual diarrhea, constipation, pain near the anus) TENDERNESS IN MOUTH AND THROAT WITH OR WITHOUT PRESENCE OF ULCERS (sore throat, sores in mouth, or a toothache) UNUSUAL RASH, SWELLING OR PAIN  UNUSUAL VAGINAL DISCHARGE OR ITCHING   Items with * indicate a potential emergency and should be followed up as soon as possible or go to the Emergency Department if any problems should occur.  Please show the CHEMOTHERAPY ALERT CARD or IMMUNOTHERAPY ALERT CARD at check-in to the Emergency Department and triage nurse.  Should you have questions after your visit or need to cancel or reschedule your appointment, please contact [facility] [phone]  and follow the prompts.  Office hours are 8:00 a.m. to 4:30 p.m. Monday - Friday. Please note that voicemails left after 4:00 p.m. may not be returned until the following business day.  We are closed weekends and major holidays. You have access to a nurse at all times for urgent questions. Please call the main number to the clinic [phone] and follow the prompts.  For any non-urgent questions, you may also contact your provider using MyChart. We now offer e-Visits for anyone 71 and older to request care online for non-urgent symptoms. For details visit [email or web address].   Also download the MyChart app! Go to the app store, search "MyChart", open the app, select [facility], and log in with your MyChart username and password.

## 2022-07-05 ENCOUNTER — Other Ambulatory Visit (HOSPITAL_COMMUNITY): Payer: Medicaid Other

## 2022-07-18 ENCOUNTER — Other Ambulatory Visit: Payer: Self-pay | Admitting: Allergy & Immunology

## 2022-07-22 ENCOUNTER — Encounter: Payer: Medicaid Other | Admitting: Obstetrics & Gynecology

## 2022-07-24 NOTE — Patient Instructions (Signed)
Kristin Baker  07/24/2022     '@[email or web address]'$ @   Your procedure is scheduled on  07/30/2022.   Report to [facility] at  0600  A.M.  Call this number if you have problems the morning of surgery:  [phone]  If you experience any cold or flu symptoms such as cough, fever, chills, shortness of breath, etc. between now and your scheduled surgery, please notify us at the above number.   Remember:  Do not eat after midnight.    You may drink clear liquids until 0330 am on 07/30/2022.      Clear liquids allowed are:                    Water, Juice (No red color; non-citric and without pulp; diabetics please choose diet or no sugar options), Carbonated beverages (diabetics please choose diet or no sugar options), Clear Tea (No creamer, milk, or cream, including half & half and powdered creamer), Black Coffee Only (No creamer, milk or cream, including half & half and powdered creamer), Plain Jell-O Only (No red color; diabetics please choose no sugar options), Clear Sports drink (No red color; diabetics please choose diet or no sugar options), and Plain Popsicles Only (No red color; diabetics please choose no sugar options)       At 0330 am on 07/30/2022, drink your carb drink. You can have nothing else to drink after this.    Take these medicines the morning of surgery with A SIP OF WATER         xyzal, levothyroxine, maxalt (if needed), zanaflex (if needed).     Do not wear jewelry, make-up or nail polish.  Do not wear lotions, powders, or perfumes, or deodorant.  Do not shave 48 hours prior to surgery.  Men may shave face and neck.  Do not bring valuables to the hospital.  [facility] is not responsible for any belongings or valuables.  Contacts, dentures or bridgework may not be worn into surgery.  Leave your suitcase in the car.  After surgery it may be brought to your room.  For patients admitted to the hospital, discharge time will be determined by your  treatment team.  Patients discharged the day of surgery will not be allowed to drive home and must have someone with them for 24 hours.    Special instructions:   DO NOT smoke tobacco or vape for 24 hours before your procedure.  Please read over the following fact sheets that you were given. Pain Booklet, Coughing and Deep Breathing, Blood Transfusion Information, Surgical Site Infection Prevention, Anesthesia Post-op Instructions, and Care and Recovery After Surgery              Total Laparoscopic Hysterectomy, Care After The following information offers guidance on how to care for yourself after your procedure. Your health care provider may also give you more specific instructions. If you have problems or questions, contact your health care provider. What can I expect after the procedure? After the procedure, it is common to have: Pain, bruising, and numbness around your incisions. Tiredness (fatigue). Poor appetite. Less interest in sex. Vaginal discharge or bleeding. You will need to use a sanitary pad after this procedure. Feelings of sadness or other emotions. If your ovaries were also removed, it is also common to have symptoms of menopause, such as hot flashes, night sweats, and lack of sleep (insomnia). Follow these instructions at home:  Medicines Take over-the-counter and prescription medicines only as told by your health care provider. Ask your health care provider if the medicine prescribed to you: Requires you to avoid driving or using machinery. Can cause constipation. You may need to take these actions to prevent or treat constipation: Drink enough fluid to keep your urine pale yellow. Take over-the-counter or prescription medicines. Eat foods that are high in fiber, such as beans, whole grains, and fresh fruits and vegetables. Limit foods that are high in fat and processed sugars, such as fried or sweet foods. Incision care  Follow instructions from your health  care provider about how to take care of your incisions. Make sure you: Wash your hands with soap and water for at least 20 seconds before and after you change your bandage (dressing). If soap and water are not available, use hand sanitizer. Change your dressing as told by your health care provider. Leave stitches (sutures), skin glue, or adhesive strips in place. These skin closures may need to stay in place for 2 weeks or longer. If adhesive strip edges start to loosen and curl up, you may trim the loose edges. Do not remove adhesive strips completely unless your health care provider tells you to do that. Check your incision areas every day for signs of infection. Check for: More redness, swelling, or pain. Fluid or blood. Warmth. Pus or a bad smell. Activity  Rest as told by your health care provider. Avoid sitting for a long time without moving. Get up to take short walks every 1-2 hours. This is important to improve blood flow and breathing. Ask for help if you feel weak or unsteady. Return to your normal activities as told by your health care provider. Ask your health care provider what activities are safe for you. Do not lift anything that is heavier than 10 lb (4.5 kg), or the limit that you are told, for one month after surgery or until your health care provider says that it is safe. If you were given a sedative during the procedure, it can affect you for several hours. Do not drive or operate machinery until your health care provider says that it is safe. Lifestyle Do not use any products that contain nicotine or tobacco. These products include cigarettes, chewing tobacco, and vaping devices, such as e-cigarettes. These can delay healing after surgery. If you need help quitting, ask your health care provider. Do not drink alcohol until your health care provider approves. General instructions  Do not douche, use tampons, or have sex for at least 6 weeks, or as told by your health care  provider. If you struggle with physical or emotional changes after your procedure, speak with your health care provider or a therapist. Do not take baths, swim, or use a hot tub until your health care provider approves. You may only be allowed to take showers for 2-3 weeks. Keep your dressing dry until your health care provider says it can be removed. Try to have someone at home with you for the first 1-2 weeks to help with your daily chores. Wear compression stockings as told by your health care provider. These stockings help to prevent blood clots and reduce swelling in your legs. Keep all follow-up visits. This is important. Contact a health care provider if: You have any of these signs of infection: Chills or a fever. More redness, swelling, or pain around an incision. Fluid or blood coming from an incision. Warmth coming from an incision. Pus or a bad  smell coming from an incision. An incision opens. You feel dizzy or light-headed. You have pain or bleeding when you urinate, or you are unable to urinate. You have abnormal vaginal discharge. You have pain that does not get better with medicine. Get help right away if: You have a fever and your symptoms suddenly get worse. You have severe abdominal pain. You have chest pain or shortness of breath. You faint. You have pain, swelling, or redness in your leg. You have heavy vaginal bleeding with blood clots, soaking through a sanitary pad in less than 1 hour. These symptoms may represent a serious problem that is an emergency. Do not wait to see if the symptoms will go away. Get medical help right away. Call your local emergency services (911 in the [place] Do not drive yourself to the hospital. Summary After the procedure, it is common to have pain and bruising around your incisions. Do not take baths, swim, or use a hot tub until your health care provider approves. Do not lift anything that is heavier than 10 lb (4.5 kg), or the limit  that you are told, for one month after surgery or until your health care provider says that it is safe. Tell your health care provider if you have any signs or symptoms of infection after the procedure. Get help right away if you have severe abdominal pain, chest pain, shortness of breath, or heavy bleeding from your vagina. This information is not intended to replace advice given to you by your health care provider. Make sure you discuss any questions you have with your health care provider. Document Revised: 01/14/2020 Document Reviewed: 01/14/2020 Elsevier Patient Education  [place] Anesthesia, Adult, Care After The following information offers guidance on how to care for yourself after your procedure. Your health care provider may also give you more specific instructions. If you have problems or questions, contact your health care provider. What can I expect after the procedure? After the procedure, it is common for people to: Have pain or discomfort at the IV site. Have nausea or vomiting. Have a sore throat or hoarseness. Have trouble concentrating. Feel cold or chills. Feel weak, sleepy, or tired (fatigue). Have soreness and body aches. These can affect parts of the body that were not involved in surgery. Follow these instructions at home: For the time period you were told by your health care provider:  Rest. Do not participate in activities where you could fall or become injured. Do not drive or use machinery. Do not drink alcohol. Do not take sleeping pills or medicines that cause drowsiness. Do not make important decisions or sign legal documents. Do not take care of children on your own. General instructions Drink enough fluid to keep your urine pale yellow. If you have sleep apnea, surgery and certain medicines can increase your risk for breathing problems. Follow instructions from your health care provider about wearing your sleep device: Anytime you are  sleeping, including during daytime naps. While taking prescription pain medicines, sleeping medicines, or medicines that make you drowsy. Return to your normal activities as told by your health care provider. Ask your health care provider what activities are safe for you. Take over-the-counter and prescription medicines only as told by your health care provider. Do not use any products that contain nicotine or tobacco. These products include cigarettes, chewing tobacco, and vaping devices, such as e-cigarettes. These can delay incision healing after surgery. If you need help quitting, ask your health care provider.  Contact a health care provider if: You have nausea or vomiting that does not get better with medicine. You vomit every time you eat or drink. You have pain that does not get better with medicine. You cannot urinate or have bloody urine. You develop a skin rash. You have a fever. Get help right away if: You have trouble breathing. You have chest pain. You vomit blood. These symptoms may be an emergency. Get help right away. Call 911. Do not wait to see if the symptoms will go away. Do not drive yourself to the hospital. Summary After the procedure, it is common to have a sore throat, hoarseness, nausea, vomiting, or to feel weak, sleepy, or fatigue. For the time period you were told by your health care provider, do not drive or use machinery. Get help right away if you have difficulty breathing, have chest pain, or vomit blood. These symptoms may be an emergency. This information is not intended to replace advice given to you by your health care provider. Make sure you discuss any questions you have with your health care provider. Document Revised: 08/10/2021 Document Reviewed: 08/10/2021 Elsevier Patient Education  [place]. How to Use Chlorhexidine Before Surgery Chlorhexidine gluconate (CHG) is a germ-killing (antiseptic) solution that is used to clean the skin. It  can get rid of the bacteria that normally live on the skin and can keep them away for about 24 hours. To clean your skin with CHG, you may be given: A CHG solution to use in the shower or as part of a sponge bath. A prepackaged cloth that contains CHG. Cleaning your skin with CHG may help lower the risk for infection: While you are staying in the intensive care unit of the hospital. If you have a vascular access, such as a central line, to provide short-term or long-term access to your veins. If you have a catheter to drain urine from your bladder. If you are on a ventilator. A ventilator is a machine that helps you breathe by moving air in and out of your lungs. After surgery. What are the risks? Risks of using CHG include: A skin reaction. Hearing loss, if CHG gets in your ears and you have a perforated eardrum. Eye injury, if CHG gets in your eyes and is not rinsed out. The CHG product catching fire. Make sure that you avoid smoking and flames after applying CHG to your skin. Do not use CHG: If you have a chlorhexidine allergy or have previously reacted to chlorhexidine. On babies younger than 72 months of age. How to use CHG solution Use CHG only as told by your health care provider, and follow the instructions on the label. Use the full amount of CHG as directed. Usually, this is one bottle. During a shower Follow these steps when using CHG solution during a shower (unless your health care provider gives you different instructions): Start the shower. Use your normal soap and shampoo to wash your face and hair. Turn off the shower or move out of the shower stream. Pour the CHG onto a clean washcloth. Do not use any type of brush or rough-edged sponge. Starting at your neck, lather your body down to your toes. Make sure you follow these instructions: If you will be having surgery, pay special attention to the part of your body where you will be having surgery. Scrub this area for at  least 1 minute. Do not use CHG on your head or face. If the solution gets into  your ears or eyes, rinse them well with water. Avoid your genital area. Avoid any areas of skin that have broken skin, cuts, or scrapes. Scrub your back and under your arms. Make sure to wash skin folds. Let the lather sit on your skin for 1-2 minutes or as long as told by your health care provider. Thoroughly rinse your entire body in the shower. Make sure that all body creases and crevices are rinsed well. Dry off with a clean towel. Do not put any substances on your body afterward--such as powder, lotion, or perfume--unless you are told to do so by your health care provider. Only use lotions that are recommended by the manufacturer. Put on clean clothes or pajamas. If it is the night before your surgery, sleep in clean sheets.  During a sponge bath Follow these steps when using CHG solution during a sponge bath (unless your health care provider gives you different instructions): Use your normal soap and shampoo to wash your face and hair. Pour the CHG onto a clean washcloth. Starting at your neck, lather your body down to your toes. Make sure you follow these instructions: If you will be having surgery, pay special attention to the part of your body where you will be having surgery. Scrub this area for at least 1 minute. Do not use CHG on your head or face. If the solution gets into your ears or eyes, rinse them well with water. Avoid your genital area. Avoid any areas of skin that have broken skin, cuts, or scrapes. Scrub your back and under your arms. Make sure to wash skin folds. Let the lather sit on your skin for 1-2 minutes or as long as told by your health care provider. Using a different clean, wet washcloth, thoroughly rinse your entire body. Make sure that all body creases and crevices are rinsed well. Dry off with a clean towel. Do not put any substances on your body afterward--such as powder, lotion,  or perfume--unless you are told to do so by your health care provider. Only use lotions that are recommended by the manufacturer. Put on clean clothes or pajamas. If it is the night before your surgery, sleep in clean sheets. How to use CHG prepackaged cloths Only use CHG cloths as told by your health care provider, and follow the instructions on the label. Use the CHG cloth on clean, dry skin. Do not use the CHG cloth on your head or face unless your health care provider tells you to. When washing with the CHG cloth: Avoid your genital area. Avoid any areas of skin that have broken skin, cuts, or scrapes. Before surgery Follow these steps when using a CHG cloth to clean before surgery (unless your health care provider gives you different instructions): Using the CHG cloth, vigorously scrub the part of your body where you will be having surgery. Scrub using a back-and-forth motion for 3 minutes. The area on your body should be completely wet with CHG when you are done scrubbing. Do not rinse. Discard the cloth and let the area air-dry. Do not put any substances on the area afterward, such as powder, lotion, or perfume. Put on clean clothes or pajamas. If it is the night before your surgery, sleep in clean sheets.  For general bathing Follow these steps when using CHG cloths for general bathing (unless your health care provider gives you different instructions). Use a separate CHG cloth for each area of your body. Make sure you wash between any folds  of skin and between your fingers and toes. Wash your body in the following order, switching to a new cloth after each step: The front of your neck, shoulders, and chest. Both of your arms, under your arms, and your hands. Your stomach and groin area, avoiding the genitals. Your right leg and foot. Your left leg and foot. The back of your neck, your back, and your buttocks. Do not rinse. Discard the cloth and let the area air-dry. Do not put any  substances on your body afterward--such as powder, lotion, or perfume--unless you are told to do so by your health care provider. Only use lotions that are recommended by the manufacturer. Put on clean clothes or pajamas. Contact a health care provider if: Your skin gets irritated after scrubbing. You have questions about using your solution or cloth. You swallow any chlorhexidine. Call your local poison control center (1-269-337-0501 in the [place] Get help right away if: Your eyes itch badly, or they become very red or swollen. Your skin itches badly and is red or swollen. Your hearing changes. You have trouble seeing. You have swelling or tingling in your mouth or throat. You have trouble breathing. These symptoms may represent a serious problem that is an emergency. Do not wait to see if the symptoms will go away. Get medical help right away. Call your local emergency services (911 in the [place] Do not drive yourself to the hospital. Summary Chlorhexidine gluconate (CHG) is a germ-killing (antiseptic) solution that is used to clean the skin. Cleaning your skin with CHG may help to lower your risk for infection. You may be given CHG to use for bathing. It may be in a bottle or in a prepackaged cloth to use on your skin. Carefully follow your health care provider's instructions and the instructions on the product label. Do not use CHG if you have a chlorhexidine allergy. Contact your health care provider if your skin gets irritated after scrubbing. This information is not intended to replace advice given to you by your health care provider. Make sure you discuss any questions you have with your health care provider. Document Revised: 09/10/2021 Document Reviewed: 07/24/2020 Elsevier Patient Education  [place].

## 2022-07-25 ENCOUNTER — Other Ambulatory Visit: Payer: Self-pay | Admitting: Obstetrics & Gynecology

## 2022-07-25 DIAGNOSIS — Z01818 Encounter for other preprocedural examination: Secondary | ICD-10-CM

## 2022-07-26 ENCOUNTER — Encounter (HOSPITAL_COMMUNITY)
Admission: RE | Admit: 2022-07-26 | Discharge: 2022-07-26 | Disposition: A | Discharge status: Home / Self Care | Payer: Medicaid Other | Source: Ambulatory Visit | Attending: Obstetrics & Gynecology | Admitting: Obstetrics & Gynecology

## 2022-07-26 ENCOUNTER — Encounter: Payer: Self-pay | Admitting: Obstetrics & Gynecology

## 2022-07-26 ENCOUNTER — Encounter (HOSPITAL_COMMUNITY): Payer: Self-pay

## 2022-07-26 VITALS — BP 152/112 | HR 110 | Temp 98.2°F | Ht 67.0 in | Wt 290.0 lb

## 2022-07-26 DIAGNOSIS — E876 Hypokalemia: Secondary | ICD-10-CM

## 2022-07-26 DIAGNOSIS — I10 Essential (primary) hypertension: Secondary | ICD-10-CM | POA: Diagnosis not present

## 2022-07-26 DIAGNOSIS — Z01818 Encounter for other preprocedural examination: Secondary | ICD-10-CM

## 2022-07-26 HISTORY — DX: Other specified health status: Z78.9

## 2022-07-26 LAB — COMPREHENSIVE METABOLIC PANEL
ALT: 13 U/L (ref 0–44)
AST: 14 U/L — ABNORMAL LOW (ref 15–41)
Albumin: 3.8 g/dL (ref 3.5–5.0)
Alkaline Phosphatase: 61 U/L (ref 38–126)
Anion gap: 7 (ref 5–15)
BUN: 10 mg/dL (ref 6–20)
CO2: 20 mmol/L — ABNORMAL LOW (ref 22–32)
Calcium: 8.7 mg/dL — ABNORMAL LOW (ref 8.9–10.3)
Chloride: 104 mmol/L (ref 98–111)
Creatinine, Ser: 0.78 mg/dL (ref 0.44–1.00)
GFR, Estimated: >60 mL/min (ref >60)
Glucose, Bld: 100 mg/dL — ABNORMAL HIGH (ref 70–99)
Potassium: 2.7 mmol/L — CL (ref 3.5–5.1)
Sodium: 131 mmol/L — ABNORMAL LOW (ref 135–145)
Total Bilirubin: 0.4 mg/dL (ref 0.3–1.2)
Total Protein: 7.8 g/dL (ref 6.5–8.1)

## 2022-07-26 LAB — TYPE AND SCREEN
ABO/RH(D): O POS
Antibody Screen: NEGATIVE

## 2022-07-26 LAB — RAPID HIV SCREEN (HIV 1/2 AB+AG)
HIV 1/2 Antibodies: NONREACTIVE
HIV-1 P24 Antigen - HIV24: NONREACTIVE

## 2022-07-26 LAB — URINALYSIS, ROUTINE W REFLEX MICROSCOPIC
Bilirubin Urine: NEGATIVE
Glucose, UA: NEGATIVE mg/dL
Ketones, ur: NEGATIVE mg/dL
Nitrite: NEGATIVE
Protein, ur: NEGATIVE mg/dL
Specific Gravity, Urine: 1.004 — ABNORMAL LOW (ref 1.005–1.030)
WBC, UA: >50 WBC/hpf (ref 0–5)
pH: 7 (ref 5.0–8.0)

## 2022-07-26 LAB — CBC
HCT: 38.2 % (ref 36.0–46.0)
Hemoglobin: 12.5 g/dL (ref 12.0–15.0)
MCH: 25 pg — ABNORMAL LOW (ref 26.0–34.0)
MCHC: 32.7 g/dL (ref 30.0–36.0)
MCV: 76.4 fL — ABNORMAL LOW (ref 80.0–100.0)
Platelets: 477 10*3/uL — ABNORMAL HIGH (ref 150–400)
RBC: 5 MIL/uL (ref 3.87–5.11)
RDW: 22.7 % — ABNORMAL HIGH (ref 11.5–15.5)
WBC: 10.5 10*3/uL (ref 4.0–10.5)
nRBC: 0 % (ref 0.0–0.2)

## 2022-07-26 LAB — PREGNANCY, URINE: Preg Test, Ur: NEGATIVE

## 2022-07-26 MED ORDER — POTASSIUM CHLORIDE CRYS ER 20 MEQ PO TBCR: 20.0000 meq | EXTENDED_RELEASE_TABLET | Freq: Three times a day (TID) | ORAL | 1 refills | Status: DC

## 2022-07-26 NOTE — Pre-Procedure Instructions (Signed)
Dr Elonda Husky messaged about 2.7 potassium. He sent patient a my chart message to pick up and start her potassium today.

## 2022-07-29 ENCOUNTER — Encounter: Payer: Self-pay | Admitting: *Deleted

## 2022-07-29 ENCOUNTER — Other Ambulatory Visit: Payer: Medicaid Other | Admitting: *Deleted

## 2022-07-29 NOTE — Patient Outreach (Signed)
Medicaid Managed Care   Nurse Care Manager Note  07/29/2022 Name:  Kristin Baker MRN:  [id number] DOB:  12-19-1990  Kristin Baker is an 32 y.o. year old female who is a primary patient of [name], [place].  The [facility] Managed Care Coordination team was consulted for assistance with:    HTN Pain  [name] was given information about Medicaid Managed Care Coordination team services today. [name] Patient agreed to services and verbal consent obtained.  Engaged with patient by telephone for follow up visit in response to provider referral for case management and/or care coordination services.   Assessments/Interventions:  Review of past medical history, allergies, medications, health status, including review of consultants reports, laboratory and other test data, was performed as part of comprehensive evaluation and provision of chronic care management services.  SDOH (Social Determinants of Health) assessments and interventions performed: SDOH Interventions    Flowsheet Row Patient Outreach Telephone from 07/29/2022 in [place] Patient Outreach Telephone from 06/11/2022 in [place] Patient Outreach Telephone from 04/12/2022 in [place] Patient Outreach Telephone from 02/11/2022 in [place] Coordination Nutrition from 12/17/2021 in [place] at [employer] Patient Outreach Telephone from 11/07/2021 in [place] Interventions        Food Insecurity Interventions Intervention Not Indicated Intervention Not Indicated Intervention Not Indicated -- -- --  Housing Interventions Intervention Not Indicated -- -- Intervention Not Indicated -- --  Transportation Interventions Intervention Not Indicated Intervention Not Indicated -- Intervention Not Indicated -- --  Utilities  Interventions -- -- Intervention Not Indicated -- -- --  Depression Interventions/Treatment  -- -- -- -- Referral to Psychiatry --  Stress Interventions -- -- -- -- -- Halfway, Live Life Well       Care Plan  Allergies  Allergen Reactions   Poison Ivy Extract Anaphylaxis   Poison Oak Extract Anaphylaxis   Flexeril [Cyclobenzaprine] Hives   Lactose Intolerance (Gi) Diarrhea   Honeysuckle Flower [Lonicera] Rash   Mixed Grasses Rash    Medications Reviewed Today     Reviewed by Melissa Montane, RN (Registered Nurse) on 07/29/22 at [id number]  Med List Status: <None>   Medication Order Taking? Sig Documenting Provider Last Dose Status Informant  acetaminophen (TYLENOL) 500 MG tablet [id number] Yes Take 500-1,000 mg by mouth every 6 (six) hours as needed for moderate pain. [provider] Taking Active Self  amphetamine-dextroamphetamine (ADDERALL XR) 20 MG 24 hr capsule [id number] Yes Take 20 mg by mouth daily. [provider] Taking Active Self  amphetamine-dextroamphetamine (ADDERALL) 5 MG tablet [id number] Yes Take 5 mg by mouth daily in the afternoon. [provider] Taking Active Self  azelastine (ASTELIN) 0.1 % nasal spray [id number] Yes 2 sprays per nostril 1-2 times daily as needed. [name], MD Taking Active Self  Cholecalciferol (VITAMIN D3) 25 MCG (1000 UT) CAPS [id number] Yes Take 1 capsule (1,000 Units total) by mouth daily. [name], FNP Taking Active Self           Med Note Jilda Roche A   Tue Jul 23, 2022 12:20 PM)    fluticasone (FLOVENT HFA) 110 MCG/ACT inhaler [id number] Yes Inhale 2 puffs into the lungs 2 (two) times daily. With spacer. [name], MD Taking Active Self  Galcanezumab-gnlm [facility]) 120 MG/ML [name] [id number] Yes Inject 1 pen  into the skin every  30 (thirty) days. Lomax, Amy, NP Taking Active Self  hydrochlorothiazide (HYDRODIURIL) 12.5 MG tablet [id number] Yes TAKE 1  TABLET(12.5 MG) BY MOUTH DAILY [name], FNP Taking Active Self  ibuprofen (ADVIL) 200 MG tablet [id number] Yes Take 400 mg by mouth every 6 (six) hours as needed for moderate pain. [provider] Taking Active Self  levocetirizine (XYZAL) 5 MG tablet [id number]  Take 1 tablet (5 mg total) by mouth in the morning and at bedtime.  Patient taking differently: Take 5 mg by mouth daily.   [name], MD  Expired 07/23/22 2359 Self  levothyroxine (SYNTHROID) 112 MCG tablet [id number] Yes Take 1 tablet (112 mcg total) by mouth daily. [name], FNP Taking Active Self  megestrol (MEGACE) 40 MG tablet [id number] Yes Take 1 tablet (40 mg total) by mouth daily. Estill Dooms, NP Taking Active Self  potassium chloride SA (KLOR-CON M) 20 MEQ tablet [id number] Yes Take 1 tablet (20 mEq total) by mouth 3 (three) times daily. Florian Buff, MD Taking Active   rizatriptan (MAXALT-MLT) 10 MG disintegrating tablet [id number] Yes DISSOLVE ONE TABLET BY MOUTH AS NEEDED FOR MIGRAINE. MAY REPEAT IN 2 HOURS IF NEEDED. NO MORE THAN 2 TABLETS DAILY OR 10 TABLETS A MONTH. Lomax, Amy, NP Taking Active Self  tiZANidine (ZANAFLEX) 4 MG tablet [id number] Yes Take 4 mg by mouth at bedtime as needed for muscle spasms. [provider] Taking Active Self           Med Note (Terika Pillard A   Tue Jun 11, 2022  9:15 AM)    triamcinolone (NASACORT) 55 MCG/ACT AERO nasal inhaler [id number] Yes Place 1 spray into the nose daily. [name], MD Taking Active Self  VENTOLIN HFA 108 [phone]) MCG/ACT inhaler [id number] Yes INHALE 2 PUFFS INTO THE LUNGS EVERY 4 HOURS AS NEEDED FOR WHEEZING OR SHORTNESS OF BREATH OR COUGH [name], MD Taking Active Self            Patient Active Problem List   Diagnosis Date Noted   Iron deficiency anemia due to chronic blood loss 06/19/2022   Low iron 05/06/2022   Irregular bleeding 04/11/2022   Menorrhagia with irregular cycle  04/11/2022   Pelvic pain 04/11/2022   Annual physical exam 10/24/2021   Fatigue 10/24/2021   Injury of foot, left, initial encounter 10/11/2021   Left shoulder pain 08/30/2021   Seasonal and perennial allergic rhinitis 07/18/2021   Hyperlipidemia 06/25/2021   H/O multiple allergies 05/14/2021   Anxiety and depression 05/14/2021   Morbid obesity ([place]) 05/14/2021   Hypertension 05/14/2021   Allergies 05/14/2021   Chronic bilateral low back pain without sciatica 05/14/2021   Vitamin D deficiency 01/15/2021   Chronic migraine w/o aura, not intractable, w/o stat migr 01/15/2021   Chronic daily headache 01/15/2021   White matter abnormality on MRI of brain 01/15/2021   Acquired hypothyroidism 07/10/2020   Essential hypertension 07/10/2020   DDD (degenerative disc disease), lumbar 01/11/2020    Conditions to be addressed/monitored per PCP order:  HTN and Pain  Care Plan : RN Care Manager Plan of Care  Updates made by Melissa Montane, RN since 07/29/2022 12:00 AM     Problem: Health Management needs related to Chronic Pain      Long-Range Goal: Development of Plan of Care to address Health Management needs related to Chronic Pain   Start Date: 08/31/2021  Expected End Date: 08/28/2022  Priority: High  Note:   Current  Barriers:  Chronic Disease Management support and education needs related to Chronic Pain Ms. Byas is scheduled for Total Hysterectomy and Bilat Salpingectomy on 07/30/22. She is excited to having this surgery, in hopes that she will start to fee better. She has all the details for her recovery period planned out and has family to provide support.  RNCM Clinical Goal(s):  Patient will verbalize understanding of plan for management of chronic back pain as evidenced by patient verbalization of self monitoring activities continue to work with RN Care Manager and/or Social Worker to address care management and care coordination needs related to chronic back pain as evidenced by  adherence to CM Team Scheduled appointments     through collaboration with RN Care manager, provider, and care team.   Interventions: Inter-disciplinary care team collaboration (see longitudinal plan of care) Evaluation of current treatment plan related to  self management and patient's adherence to plan as established by provider Reviewed upcoming appointments including: 07/30/22 for Total Hysterectomy and Bilat Salpingectomy, 08/08/22 with Genetic Couselor and 08/09/22 for follow up with Dr. Elonda Husky  Hypertension Interventions:  (Status:  Goal on track:  NO.) Long Term Goal Last practice recorded BP readings:  BP Readings from Last 3 Encounters:  07/26/22 (!) 152/112  07/03/22 (!) 141/77  06/25/22 (!) 143/49  Most recent eGFR/CrCl:  Lab Results  Component Value Date   EGFR 107 04/26/2022    No components found for: "CRCL"  Evaluation of current treatment plan related to hypertension self management and patient's adherence to plan as established by provider Reviewed medications with patient and discussed importance of compliance Counseled on the importance of exercise goals with target of 150 minutes per week Discussed plans with patient for ongoing care management follow up and provided patient with direct contact information for care management team Provided education on prescribed diet DASH Reviewed and discussed recent readings, patient feels that recent elevated BP is related to increased pain, anxiety and low potassium Advised to take all medications as prescribed Provided information on managing HTN   Pain:  (Status: Goal on Track (progressing): YES.) Long Term Goal  Pain assessment performed-Hip pain 1-2/10 Medications reviewed Discussed importance of adherence to all scheduled medical appointments; Counseled on the importance of reporting any/all new or changed pain symptoms or management strategies to pain management provider; Advised patient to report to care team affect of  pain on daily activities; Reviewed with patient prescribed pharmacological and nonpharmacological pain relief strategies; Assessed social determinant of health barriers;   Patient Goals/Self-Care Activities: Call provider office for new concerns or questions  Exercise daily, start with 10 minutes a day Eat foods to that help improve pain, avoid sugar Use heat/ice and relaxation to help improve pain       Follow Up:  Patient agrees to Care Plan and Follow-up.  Plan: The Managed Medicaid care management team will reach out to the patient again over the next 30 days.  Date/time of next scheduled RN care management/care coordination outreach:  08/28/22 at [place], [place] RN Care Coordinator

## 2022-07-29 NOTE — Patient Instructions (Signed)
Visit Information  Kristin Baker was given information about Medicaid Managed Care team care coordination services as a part of their [place] Medicaid benefit. Kristin Baker verbally consented to engagement with the [facility] Managed Care team.   If you are experiencing a medical emergency, please call 911 or report to your local emergency department or urgent care.   If you have a non-emergency medical problem during routine business hours, please contact your provider's office and ask to speak with a nurse.   For questions related to your [facility], please call: [phone] or visit the homepage here: [email or web address]  If you would like to schedule transportation through your [facility], please call the following number at least 2 days in advance of your appointment: [phone]   Rides for urgent appointments can also be made after hours by calling Member Services.  Call the [place] at [phone], at any time, 24 hours a day, 7 days a week. If you are in danger or need immediate medical attention call 911.  If you would like help to quit smoking, call 1-800-QUIT-NOW (681)047-0435) OR Espaol: 1-855-Djelo-Ya HD:1601594) o para ms informacin haga clic aqu or Text READY to 200-400 to register via text  Kristin Baker,   Please see education materials related to HTN provided by MyChart link.  Patient verbalizes understanding of instructions and care plan provided today and agrees to view in [place]. Active MyChart status and patient understanding of how to access instructions and care plan via MyChart confirmed with patient.     Telephone follow up appointment with Managed Medicaid care management team member scheduled for:08/28/22 @ [place] RN, [place] RN Care  Coordinator   Following is a copy of your plan of care:  Care Plan : RN Care Manager Plan of Care  Updates made by Kristin Montane, RN since 07/29/2022 12:00 AM     Problem: Health Management needs related to Chronic Pain      Long-Range Goal: Development of Plan of Care to address Health Management needs related to Chronic Pain   Start Date: 08/31/2021  Expected End Date: 08/28/2022  Priority: High  Note:   Current Barriers:  Chronic Disease Management support and education needs related to Chronic Pain Kristin Baker is scheduled for Total Hysterectomy and Bilat Salpingectomy on 07/30/22. She is excited to having this surgery, in hopes that she will start to fee better. She has all the details for her recovery period planned out and has family to provide support.  RNCM Clinical Goal(s):  Patient will verbalize understanding of plan for management of chronic back pain as evidenced by patient verbalization of self monitoring activities continue to work with [place] and/or Social Worker to address care management and care coordination needs related to chronic back pain as evidenced by adherence to CM Team Scheduled appointments     through collaboration with RN Care manager, provider, and care team.   Interventions: Inter-disciplinary care team collaboration (see longitudinal plan of care) Evaluation of current treatment plan related to  self management and patient's adherence to plan as established by provider Reviewed upcoming appointments including: 07/30/22 for Total Hysterectomy and Bilat Salpingectomy, 08/08/22 with Genetic Couselor and 08/09/22 for follow up with Dr. Elonda Husky  Hypertension Interventions:  (Status:  Goal on track:  NO.) Long Term Goal Last practice recorded BP readings:  BP Readings from Last 3 Encounters:  07/26/22 Marland Kitchen)  152/112  07/03/22 (!) 141/77  06/25/22 (!) 143/49  Most recent eGFR/CrCl:  Lab Results  Component Value Date   EGFR 107 04/26/2022    No components  found for: "CRCL"  Evaluation of current treatment plan related to hypertension self management and patient's adherence to plan as established by provider Reviewed medications with patient and discussed importance of compliance Counseled on the importance of exercise goals with target of 150 minutes per week Discussed plans with patient for ongoing care management follow up and provided patient with direct contact information for care management team Provided education on prescribed diet DASH Reviewed and discussed recent readings, patient feels that recent elevated BP is related to increased pain, anxiety and low potassium Advised to take all medications as prescribed Provided information on managing HTN   Pain:  (Status: Goal on Track (progressing): YES.) Long Term Goal  Pain assessment performed-Hip pain 1-2/10 Medications reviewed Discussed importance of adherence to all scheduled medical appointments; Counseled on the importance of reporting any/all new or changed pain symptoms or management strategies to pain management provider; Advised patient to report to care team affect of pain on daily activities; Reviewed with patient prescribed pharmacological and nonpharmacological pain relief strategies; Assessed social determinant of health barriers;   Patient Goals/Self-Care Activities: Call provider office for new concerns or questions  Exercise daily, start with 10 minutes a day Eat foods to that help improve pain, avoid sugar Use heat/ice and relaxation to help improve pain

## 2022-07-30 ENCOUNTER — Ambulatory Visit (HOSPITAL_BASED_OUTPATIENT_CLINIC_OR_DEPARTMENT_OTHER): Payer: Medicaid Other | Admitting: Certified Registered Nurse Anesthetist

## 2022-07-30 ENCOUNTER — Ambulatory Visit (HOSPITAL_COMMUNITY): Payer: Medicaid Other | Admitting: Certified Registered Nurse Anesthetist

## 2022-07-30 ENCOUNTER — Ambulatory Visit (HOSPITAL_COMMUNITY)
Admission: RE | Admit: 2022-07-30 | Discharge: 2022-07-30 | Disposition: A | Discharge status: Home / Self Care | Payer: Medicaid Other | Source: Ambulatory Visit | Attending: Obstetrics & Gynecology | Admitting: Obstetrics & Gynecology

## 2022-07-30 ENCOUNTER — Encounter (HOSPITAL_COMMUNITY)
Admission: RE | Disposition: A | Discharge status: Home / Self Care | Payer: Self-pay | Source: Ambulatory Visit | Attending: Obstetrics & Gynecology

## 2022-07-30 ENCOUNTER — Other Ambulatory Visit: Payer: Self-pay

## 2022-07-30 DIAGNOSIS — I10 Essential (primary) hypertension: Secondary | ICD-10-CM | POA: Diagnosis not present

## 2022-07-30 DIAGNOSIS — N838 Other noninflammatory disorders of ovary, fallopian tube and broad ligament: Secondary | ICD-10-CM | POA: Diagnosis not present

## 2022-07-30 DIAGNOSIS — Z87891 Personal history of nicotine dependence: Secondary | ICD-10-CM | POA: Insufficient documentation

## 2022-07-30 DIAGNOSIS — D649 Anemia, unspecified: Secondary | ICD-10-CM | POA: Insufficient documentation

## 2022-07-30 DIAGNOSIS — N809 Endometriosis, unspecified: Secondary | ICD-10-CM

## 2022-07-30 DIAGNOSIS — N939 Abnormal uterine and vaginal bleeding, unspecified: Secondary | ICD-10-CM | POA: Diagnosis not present

## 2022-07-30 DIAGNOSIS — Z8249 Family history of ischemic heart disease and other diseases of the circulatory system: Secondary | ICD-10-CM | POA: Insufficient documentation

## 2022-07-30 DIAGNOSIS — N946 Dysmenorrhea, unspecified: Secondary | ICD-10-CM

## 2022-07-30 DIAGNOSIS — N921 Excessive and frequent menstruation with irregular cycle: Secondary | ICD-10-CM | POA: Diagnosis present

## 2022-07-30 DIAGNOSIS — N858 Other specified noninflammatory disorders of uterus: Secondary | ICD-10-CM | POA: Diagnosis not present

## 2022-07-30 DIAGNOSIS — Z302 Encounter for sterilization: Secondary | ICD-10-CM

## 2022-07-30 DIAGNOSIS — D500 Iron deficiency anemia secondary to blood loss (chronic): Secondary | ICD-10-CM | POA: Diagnosis present

## 2022-07-30 DIAGNOSIS — E876 Hypokalemia: Secondary | ICD-10-CM

## 2022-07-30 HISTORY — PX: ROBOTIC ASSISTED TOTAL HYSTERECTOMY: SHX6085

## 2022-07-30 LAB — ABO/RH: ABO/RH(D): O POS

## 2022-07-30 SURGERY — HYSTERECTOMY, TOTAL, ROBOT-ASSISTED
Anesthesia: General | Site: Abdomen | Laterality: Bilateral

## 2022-07-30 MED ORDER — DEXAMETHASONE SODIUM PHOSPHATE 10 MG/ML IJ SOLN
INTRAMUSCULAR | Status: AC
  Filled 2022-07-30: qty 1

## 2022-07-30 MED ORDER — ACETAMINOPHEN 10 MG/ML IV SOLN
1000.0000 mg | Freq: Once | INTRAVENOUS | Status: AC
  Administered 2022-07-30: 1000 mg via INTRAVENOUS
  Filled 2022-07-30: qty 100

## 2022-07-30 MED ORDER — ROCURONIUM BROMIDE 10 MG/ML (PF) SYRINGE
PREFILLED_SYRINGE | INTRAVENOUS | Status: AC
  Filled 2022-07-30: qty 10

## 2022-07-30 MED ORDER — CEFAZOLIN SODIUM-DEXTROSE 2-3 GM-%(50ML) IV SOLR
INTRAVENOUS | Status: DC | PRN
  Administered 2022-07-30: 2 g via INTRAVENOUS
  Administered 2022-07-30: 1 g via INTRAVENOUS

## 2022-07-30 MED ORDER — ONDANSETRON HCL 4 MG/2ML IJ SOLN
INTRAMUSCULAR | Status: AC
  Filled 2022-07-30: qty 2

## 2022-07-30 MED ORDER — ONDANSETRON HCL 4 MG/2ML IJ SOLN: 4.0000 mg | Freq: Once | INTRAMUSCULAR | Status: DC | PRN

## 2022-07-30 MED ORDER — CEFAZOLIN SODIUM-DEXTROSE 2-4 GM/100ML-% IV SOLN
INTRAVENOUS | Status: AC
  Filled 2022-07-30: qty 100

## 2022-07-30 MED ORDER — PROPOFOL 10 MG/ML IV BOLUS
INTRAVENOUS | Status: AC
  Filled 2022-07-30: qty 20

## 2022-07-30 MED ORDER — LIDOCAINE HCL (CARDIAC) PF 100 MG/5ML IV SOSY
PREFILLED_SYRINGE | INTRAVENOUS | Status: DC | PRN
  Administered 2022-07-30: 100 mg via INTRAVENOUS

## 2022-07-30 MED ORDER — METOPROLOL TARTRATE 5 MG/5ML IV SOLN
INTRAVENOUS | Status: DC | PRN
  Administered 2022-07-30 (×2): 1 mg via INTRAVENOUS

## 2022-07-30 MED ORDER — LIDOCAINE HCL (PF) 2 % IJ SOLN
INTRAMUSCULAR | Status: AC
  Filled 2022-07-30: qty 5

## 2022-07-30 MED ORDER — SUCCINYLCHOLINE CHLORIDE 200 MG/10ML IV SOSY
PREFILLED_SYRINGE | INTRAVENOUS | Status: DC | PRN
  Administered 2022-07-30: 120 mg via INTRAVENOUS

## 2022-07-30 MED ORDER — BUPIVACAINE LIPOSOME 1.3 % IJ SUSP
INTRAMUSCULAR | Status: DC | PRN
  Administered 2022-07-30: 20 mL

## 2022-07-30 MED ORDER — GLYCOPYRROLATE PF 0.2 MG/ML IJ SOSY
PREFILLED_SYRINGE | INTRAMUSCULAR | Status: DC | PRN
  Administered 2022-07-30: .2 mg via INTRAVENOUS

## 2022-07-30 MED ORDER — MIDAZOLAM HCL 5 MG/5ML IJ SOLN
INTRAMUSCULAR | Status: DC | PRN
  Administered 2022-07-30: 2 mg via INTRAVENOUS

## 2022-07-30 MED ORDER — SUCCINYLCHOLINE CHLORIDE 200 MG/10ML IV SOSY
PREFILLED_SYRINGE | INTRAVENOUS | Status: AC
  Filled 2022-07-30: qty 10

## 2022-07-30 MED ORDER — BUPIVACAINE LIPOSOME 1.3 % IJ SUSP
20.0000 mL | Freq: Once | INTRAMUSCULAR | Status: DC
  Filled 2022-07-30: qty 20

## 2022-07-30 MED ORDER — SCOPOLAMINE 1 MG/3DAYS TD PT72
MEDICATED_PATCH | TRANSDERMAL | Status: AC
  Administered 2022-07-30: 1.5 mg
  Filled 2022-07-30: qty 1

## 2022-07-30 MED ORDER — OXYCODONE-ACETAMINOPHEN 5-325 MG PO TABS: 1.0000 | ORAL_TABLET | ORAL | 0 refills | Status: DC | PRN

## 2022-07-30 MED ORDER — FENTANYL CITRATE (PF) 100 MCG/2ML IJ SOLN
INTRAMUSCULAR | Status: AC
  Filled 2022-07-30: qty 2

## 2022-07-30 MED ORDER — FENTANYL CITRATE PF 50 MCG/ML IJ SOSY
25.0000 ug | PREFILLED_SYRINGE | INTRAMUSCULAR | Status: DC | PRN
  Administered 2022-07-30 (×3): 50 ug via INTRAVENOUS
  Filled 2022-07-30 (×3): qty 1

## 2022-07-30 MED ORDER — CHLORHEXIDINE GLUCONATE 0.12 % MT SOLN
OROMUCOSAL | Status: AC
  Filled 2022-07-30: qty 15

## 2022-07-30 MED ORDER — FENTANYL CITRATE (PF) 250 MCG/5ML IJ SOLN
INTRAMUSCULAR | Status: AC
  Filled 2022-07-30: qty 5

## 2022-07-30 MED ORDER — GLYCOPYRROLATE PF 0.2 MG/ML IJ SOSY
PREFILLED_SYRINGE | INTRAMUSCULAR | Status: AC
  Filled 2022-07-30: qty 1

## 2022-07-30 MED ORDER — KETOROLAC TROMETHAMINE 30 MG/ML IJ SOLN
INTRAMUSCULAR | Status: AC
  Filled 2022-07-30: qty 1

## 2022-07-30 MED ORDER — MIDAZOLAM HCL 2 MG/2ML IJ SOLN
INTRAMUSCULAR | Status: AC
  Filled 2022-07-30: qty 2

## 2022-07-30 MED ORDER — ONDANSETRON HCL 4 MG/2ML IJ SOLN
INTRAMUSCULAR | Status: DC | PRN
  Administered 2022-07-30 (×2): 4 mg via INTRAVENOUS

## 2022-07-30 MED ORDER — ROCURONIUM BROMIDE 10 MG/ML (PF) SYRINGE
PREFILLED_SYRINGE | INTRAVENOUS | Status: DC | PRN
  Administered 2022-07-30: 5 mg via INTRAVENOUS
  Administered 2022-07-30 (×3): 10 mg via INTRAVENOUS
  Administered 2022-07-30: 55 mg via INTRAVENOUS
  Administered 2022-07-30 (×2): 10 mg via INTRAVENOUS

## 2022-07-30 MED ORDER — SUGAMMADEX SODIUM 200 MG/2ML IV SOLN
INTRAVENOUS | Status: DC | PRN
  Administered 2022-07-30: 30 mg via INTRAVENOUS
  Administered 2022-07-30 (×3): 100 mg via INTRAVENOUS
  Administered 2022-07-30: 70 mg via INTRAVENOUS

## 2022-07-30 MED ORDER — PROPOFOL 500 MG/50ML IV EMUL
INTRAVENOUS | Status: DC | PRN
  Administered 2022-07-30: 30 mg via INTRAVENOUS
  Administered 2022-07-30: 20 mg via INTRAVENOUS
  Administered 2022-07-30: 150 mg via INTRAVENOUS
  Administered 2022-07-30: 25 ug/kg/min via INTRAVENOUS

## 2022-07-30 MED ORDER — METOPROLOL TARTRATE 5 MG/5ML IV SOLN
INTRAVENOUS | Status: AC
  Filled 2022-07-30: qty 5

## 2022-07-30 MED ORDER — STERILE WATER FOR IRRIGATION IR SOLN
Status: DC | PRN
  Administered 2022-07-30: 500 mL

## 2022-07-30 MED ORDER — CEFAZOLIN IN SODIUM CHLORIDE 3-0.9 GM/100ML-% IV SOLN: 3.0000 g | INTRAVENOUS | Status: DC

## 2022-07-30 MED ORDER — DEXAMETHASONE SODIUM PHOSPHATE 10 MG/ML IJ SOLN
INTRAMUSCULAR | Status: DC | PRN
  Administered 2022-07-30: 10 mg via INTRAVENOUS

## 2022-07-30 MED ORDER — OXYCODONE HCL 5 MG PO TABS
ORAL_TABLET | ORAL | Status: AC
  Filled 2022-07-30: qty 1

## 2022-07-30 MED ORDER — KETOROLAC TROMETHAMINE 30 MG/ML IJ SOLN
30.0000 mg | Freq: Once | INTRAMUSCULAR | Status: AC
  Administered 2022-07-30: 30 mg via INTRAVENOUS

## 2022-07-30 MED ORDER — ONDANSETRON 8 MG PO TBDP: 8.0000 mg | ORAL_TABLET | Freq: Three times a day (TID) | ORAL | 0 refills | Status: DC | PRN

## 2022-07-30 MED ORDER — ORAL CARE MOUTH RINSE: 15.0000 mL | Freq: Once | OROMUCOSAL | Status: AC

## 2022-07-30 MED ORDER — OXYCODONE HCL 5 MG PO TABS
5.0000 mg | ORAL_TABLET | Freq: Once | ORAL | Status: AC | PRN
  Administered 2022-07-30: 5 mg via ORAL
  Filled 2022-07-30: qty 1

## 2022-07-30 MED ORDER — DEXMEDETOMIDINE HCL IN NACL 80 MCG/20ML IV SOLN
INTRAVENOUS | Status: DC | PRN
  Administered 2022-07-30: 8 ug via BUCCAL
  Administered 2022-07-30: 4 ug via BUCCAL
  Administered 2022-07-30: 20 ug via BUCCAL
  Administered 2022-07-30: 8 ug via BUCCAL

## 2022-07-30 MED ORDER — FENTANYL CITRATE (PF) 100 MCG/2ML IJ SOLN
INTRAMUSCULAR | Status: DC | PRN
  Administered 2022-07-30 (×4): 50 ug via INTRAVENOUS
  Administered 2022-07-30: 100 ug via INTRAVENOUS

## 2022-07-30 MED ORDER — BUPIVACAINE LIPOSOME 1.3 % IJ SUSP
INTRAMUSCULAR | Status: AC
  Filled 2022-07-30: qty 20

## 2022-07-30 MED ORDER — POVIDONE-IODINE 10 % EX SWAB: 2.0000 | Freq: Once | CUTANEOUS | Status: DC

## 2022-07-30 MED ORDER — KETOROLAC TROMETHAMINE 10 MG PO TABS: 10.0000 mg | ORAL_TABLET | Freq: Three times a day (TID) | ORAL | 0 refills | Status: DC | PRN

## 2022-07-30 MED ORDER — OXYCODONE HCL 5 MG/5ML PO SOLN: 5.0000 mg | Freq: Once | ORAL | Status: AC | PRN

## 2022-07-30 MED ORDER — LACTATED RINGERS IV SOLN: INTRAVENOUS | Status: DC | PRN

## 2022-07-30 MED ORDER — CHLORHEXIDINE GLUCONATE 0.12 % MT SOLN
15.0000 mL | Freq: Once | OROMUCOSAL | Status: AC
  Administered 2022-07-30: 15 mL via OROMUCOSAL

## 2022-07-30 MED ORDER — LACTATED RINGERS IV SOLN: INTRAVENOUS | Status: DC

## 2022-07-30 SURGICAL SUPPLY — 56 items
ADH SKN CLS APL DERMABOND .7 (GAUZE/BANDAGES/DRESSINGS) ×1
ANTIFOG SOL W/FOAM PAD STRL (MISCELLANEOUS) ×1
BLADE SURG SZ11 CARB STEEL (BLADE) IMPLANT
COVER MAYO STAND XLG (MISCELLANEOUS) ×1 IMPLANT
COVER TIP SHEARS 8 DVNC (MISCELLANEOUS) ×1 IMPLANT
COVER TIP SHEARS 8MM DA VINCI (MISCELLANEOUS) ×1
DERMABOND ADVANCED .7 DNX12 (GAUZE/BANDAGES/DRESSINGS) ×1 IMPLANT
DRAPE ARM DVNC X/XI (DISPOSABLE) ×4 IMPLANT
DRAPE DA VINCI XI ARM (DISPOSABLE) ×4
DRAPE HALF SHEET 40X57 (DRAPES) ×1 IMPLANT
ELECT REM PT RETURN 9FT ADLT (ELECTROSURGICAL) ×1
ELECTRODE REM PT RTRN 9FT ADLT (ELECTROSURGICAL) ×1 IMPLANT
GAUZE 4X4 16PLY ~~LOC~~+RFID DBL (SPONGE) ×2 IMPLANT
GLOVE BIO SURGEON STRL SZ7 (GLOVE) IMPLANT
GLOVE BIOGEL PI IND STRL 7.0 (GLOVE) ×3 IMPLANT
GLOVE BIOGEL PI IND STRL 7.5 (GLOVE) IMPLANT
GLOVE BIOGEL PI IND STRL 8 (GLOVE) ×3 IMPLANT
GLOVE ECLIPSE 8.0 STRL XLNG CF (GLOVE) ×3 IMPLANT
GLOVE SURG SS PI 7.0 STRL IVOR (GLOVE) IMPLANT
GOWN STRL REUS W/TWL LRG LVL3 (GOWN DISPOSABLE) ×2 IMPLANT
GOWN STRL REUS W/TWL XL LVL3 (GOWN DISPOSABLE) ×2 IMPLANT
KIT PINK PAD W/HEAD ARE REST (MISCELLANEOUS) ×1
KIT PINK PAD W/HEAD ARM REST (MISCELLANEOUS) ×1 IMPLANT
KIT TURNOVER CYSTO (KITS) ×1 IMPLANT
MANIFOLD NEPTUNE II (INSTRUMENTS) ×1 IMPLANT
MANIPULATOR VCARE STD CRV RETR (MISCELLANEOUS) IMPLANT
NDL HYPO 21X1.5 SAFETY (NEEDLE) ×1 IMPLANT
NDL INSUFFLATION 14GA 120MM (NEEDLE) ×1 IMPLANT
NEEDLE HYPO 21X1.5 SAFETY (NEEDLE) ×1 IMPLANT
NEEDLE INSUFFLATION 14GA 120MM (NEEDLE) ×1 IMPLANT
OBTURATOR OPTICAL STANDARD 8MM (TROCAR) ×1
OBTURATOR OPTICAL STND 8 DVNC (TROCAR) ×1
OBTURATOR OPTICALSTD 8 DVNC (TROCAR) ×1 IMPLANT
OCCLUDER COLPOPNEUMO (BALLOONS) ×1 IMPLANT
PACK PERI GYN (CUSTOM PROCEDURE TRAY) ×1 IMPLANT
SEAL CANN UNIV 5-8 DVNC XI (MISCELLANEOUS) ×4 IMPLANT
SEAL XI 5MM-8MM UNIVERSAL (MISCELLANEOUS) ×4
SEALER VESSEL DA VINCI XI (MISCELLANEOUS) ×1
SEALER VESSEL EXT DVNC XI (MISCELLANEOUS) ×1 IMPLANT
SET BASIN LINEN APH (SET/KITS/TRAYS/PACK) ×1 IMPLANT
SET TRI-LUMEN FLTR TB AIRSEAL (TUBING) IMPLANT
SET TUBE SMOKE EVAC HIGH FLOW (TUBING) ×1 IMPLANT
SOLUTION ANTFG W/FOAM PAD STRL (MISCELLANEOUS) ×1 IMPLANT
SPONGE T-LAP 18X18 ~~LOC~~+RFID (SPONGE) ×1 IMPLANT
SUT STRATAFIX 0 PDS+ CT-2 23 (SUTURE) ×1
SUT VIC AB 2-0 UR6 27 (SUTURE) IMPLANT
SUT VIC AB 3-0 SH 27 (SUTURE) ×2
SUT VIC AB 3-0 SH 27X BRD (SUTURE) ×1 IMPLANT
SUTURE STRATFX 0 PDS+ CT-2 23 (SUTURE) ×1 IMPLANT
SYR 10ML LL (SYRINGE) ×2 IMPLANT
SYR 20ML LL LF (SYRINGE) ×1 IMPLANT
TAPE TRANSPORE STRL 2 31045 (GAUZE/BANDAGES/DRESSINGS) ×1 IMPLANT
TRAY FOLEY W/BAG SLVR 16FR (SET/KITS/TRAYS/PACK) ×1
TRAY FOLEY W/BAG SLVR 16FR ST (SET/KITS/TRAYS/PACK) ×1 IMPLANT
TROCAR PORT AIRSEAL 8X120 (TROCAR) IMPLANT
WATER STERILE IRR 500ML POUR (IV SOLUTION) ×1 IMPLANT

## 2022-07-30 NOTE — Transfer of Care (Signed)
Immediate Anesthesia Transfer of Care Note  Patient: Kristin Baker  Procedure(s) Performed: XI ROBOTIC ASSISTED TOTAL HYSTERECTOMY AND BILATERAL SALPINGECTOMY (Bilateral: Abdomen)  Patient Location: PACU  Anesthesia Type:General  Level of Consciousness: awake, alert , and oriented  Airway & Oxygen Therapy: Patient Spontanous Breathing and Patient connected to face mask oxygen  Post-op Assessment: Report given to RN and Post -op Vital signs reviewed and stable  Post vital signs: Reviewed and stable  Last Vitals:  Vitals Value Taken Time  BP 118/71 07/30/22 1140  Temp    Pulse 87 07/30/22 1143  Resp 22 07/30/22 1143  SpO2 98 % 07/30/22 1143  Vitals shown include unvalidated device data.  Last Pain:  Vitals:   07/30/22 0715  PainSc: 0-No pain         Complications: No notable events documented.

## 2022-07-30 NOTE — Anesthesia Procedure Notes (Signed)
Procedure Name: Intubation Date/Time: 07/30/2022 7:55 AM  Performed by: Gayland Curry, CRNAPre-anesthesia Checklist: Patient identified, Emergency Drugs available, Suction available and Patient being monitored Patient Re-evaluated:Patient Re-evaluated prior to induction Oxygen Delivery Method: Circle system utilized Preoxygenation: Pre-oxygenation with 100% oxygen Induction Type: IV induction and Rapid sequence Laryngoscope Size: Mac and 3 Grade View: Grade I Tube type: Oral Tube size: 7.0 mm Number of attempts: 1 Placement Confirmation: ETT inserted through vocal cords under direct vision, positive ETCO2 and breath sounds checked- equal and bilateral Secured at: 22 cm Tube secured with: Tape Dental Injury: Teeth and Oropharynx as per pre-operative assessment

## 2022-07-30 NOTE — Anesthesia Preprocedure Evaluation (Signed)
Anesthesia Evaluation  Patient identified by MRN, date of birth, ID band Patient awake    Reviewed: Allergy & Precautions, H&P , NPO status , Patient's Chart, lab work & pertinent test results, reviewed documented beta blocker date and time   Airway Mallampati: II  TM Distance: >3 FB Neck ROM: full    Dental no notable dental hx.    Pulmonary neg pulmonary ROS, Patient abstained from smoking., former smoker   Pulmonary exam normal breath sounds clear to auscultation       Cardiovascular Exercise Tolerance: Good hypertension, negative cardio ROS  Rhythm:regular Rate:Normal     Neuro/Psych  Headaches PSYCHIATRIC DISORDERS Anxiety Depression    negative neurological ROS  negative psych ROS   GI/Hepatic negative GI ROS, Neg liver ROS,,,  Endo/Other  Hypothyroidism  Morbid obesity  Renal/GU negative Renal ROS  negative genitourinary   Musculoskeletal   Abdominal   Peds  Hematology negative hematology ROS (+) Blood dyscrasia, anemia   Anesthesia Other Findings   Reproductive/Obstetrics negative OB ROS                             Anesthesia Physical Anesthesia Plan  ASA: 3  Anesthesia Plan: General and General ETT   Post-op Pain Management:    Induction:   PONV Risk Score and Plan: Ondansetron  Airway Management Planned:   Additional Equipment:   Intra-op Plan:   Post-operative Plan:   Informed Consent: I have reviewed the patients History and Physical, chart, labs and discussed the procedure including the risks, benefits and alternatives for the proposed anesthesia with the patient or authorized representative who has indicated his/her understanding and acceptance.     Dental Advisory Given  Plan Discussed with: CRNA  Anesthesia Plan Comments:        Anesthesia Quick Evaluation

## 2022-07-30 NOTE — H&P (Signed)
Preoperative History and Physical  Kristin Baker is a 32 y.o. G2P1011 with No LMP recorded. (Menstrual status: Irregular Periods). admitted for a .Xi Robotic assisted laparoscopic hysterectomy with bilateral salpingectomy(for ovarian cancer prophylaxis)  Long history of heavy irregular menstrual bleeding with associated Fe deficiency anemia As recently as 04/2022 hemoglobin was 7.7, improved with megestrol and iron Discussed options of ablation + BTL vs definitive therapy With age and weight as considerations, better option is hysterectomy, opted for robotic assisted with bilateral salpingectomy which is performed for ovarian cancer prophylaxis  PMH:    Past Medical History:  Diagnosis Date   Abdominal pain    ADHD    ANA positive    Back pain    DDD (degenerative disc disease), lumbar    Eye pain, right    Fatigue    Frontal sinusitis    Hypertension    Hypothyroidism    Iron deficiency anemia due to chronic blood loss 06/19/2022   Medical history non-contributory    Obesity, morbid ([place])    Palpitations    Rheumatoid arthritis ([place])     PSH:     Past Surgical History:  Procedure Laterality Date   ABLATION     05/22/2020 left side, 06/14/2020 right side     POb/GynH:      OB History     Gravida  2   Para  1   Term  1   Preterm      AB  1   Living  1      SAB      IAB      Ectopic      Multiple      Live Births              SH:   Social History   Tobacco Use   Smoking status: Former    Packs/day: 1.00    Years: 15.00    Total pack years: 15.00    Types: Cigarettes    Quit date: 11/24/2020    Years since quitting: 1.6   Smokeless tobacco: Never   Tobacco comments:    She has quit of and on.  Vaping Use   Vaping Use: Every day  Substance Use Topics   Alcohol use: Yes    Comment: occasionally /social events   Drug use: Not Currently    Comment: smoked weed during teen years    FH:    Family History  Problem Relation Age of  Onset   Cervical cancer Maternal Grandmother    Breast cancer Maternal Grandmother    Skin cancer Maternal Grandmother    Heart attack Maternal Grandfather    Diabetes Father    Heart attack Father    Hyperlipidemia Mother    Peripheral Artery Disease Mother    ADD / ADHD Mother    Bipolar disorder Mother    Cervical cancer Mother        at age 38.   ADD / ADHD Sister    Healthy Daughter    Breast cancer Maternal Aunt    Cervical cancer Maternal Aunt    ADD / ADHD Maternal Aunt    Uterine cancer Maternal Aunt    Breast cancer Maternal Aunt    Multiple sclerosis Paternal Aunt    Colon cancer Neg Hx      Allergies:  Allergies  Allergen Reactions   Poison Ivy Extract Anaphylaxis   Poison Oak Extract Anaphylaxis   Flexeril [Cyclobenzaprine] Hives   Lactose Intolerance (Gi) Diarrhea  Honeysuckle Flower [Lonicera] Rash   Mixed Grasses Rash    Medications:       Current Facility-Administered Medications:    bupivacaine liposome (EXPAREL) 1.3 % injection 266 mg, 20 mL, Infiltration, Once, Elizah Mierzwa, Mertie Clause, MD   ceFAZolin (ANCEF) 2-4 GM/100ML-% IVPB, , , ,    ceFAZolin (ANCEF) IVPB 2g/100 mL premix, 2 g, Intravenous, On Call to OR, Florian Buff, MD   chlorhexidine (PERIDEX) 0.12 % solution, , , ,    ketorolac (TORADOL) 30 MG/ML injection 30 mg, 30 mg, Intravenous, Once, Morissa Obeirne, Mertie Clause, MD   ketorolac (TORADOL) 30 MG/ML injection, , , ,    povidone-iodine 10 % swab 2 Application, 2 Application, Topical, Once, Florian Buff, MD  Review of Systems:   Review of Systems  Constitutional: Negative for fever, chills, weight loss, malaise/fatigue and diaphoresis.  HENT: Negative for hearing loss, ear pain, nosebleeds, congestion, sore throat, neck pain, tinnitus and ear discharge.   Eyes: Negative for blurred vision, double vision, photophobia, pain, discharge and redness.  Respiratory: Negative for cough, hemoptysis, sputum production, shortness of breath, wheezing and stridor.    Cardiovascular: Negative for chest pain, palpitations, orthopnea, claudication, leg swelling and PND.  Gastrointestinal: Positive for abdominal pain. Negative for heartburn, nausea, vomiting, diarrhea, constipation, blood in stool and melena.  Genitourinary: Negative for dysuria, urgency, frequency, hematuria and flank pain.  Musculoskeletal: Negative for myalgias, back pain, joint pain and falls.  Skin: Negative for itching and rash.  Neurological: Negative for dizziness, tingling, tremors, sensory change, speech change, focal weakness, seizures, loss of consciousness, weakness and headaches.  Endo/Heme/Allergies: Negative for environmental allergies and polydipsia. Does not bruise/bleed easily.  Psychiatric/Behavioral: Negative for depression, suicidal ideas, hallucinations, memory loss and substance abuse. The patient is not nervous/anxious and does not have insomnia.      PHYSICAL EXAM:  There were no vitals taken for this visit.    Vitals reviewed. Constitutional: She is oriented to person, place, and time. She appears well-developed and well-nourished.  HENT:  Head: Normocephalic and atraumatic.  Right Ear: External ear normal.  Left Ear: External ear normal.  Nose: Nose normal.  Mouth/Throat: Oropharynx is clear and moist.  Eyes: Conjunctivae and EOM are normal. Pupils are equal, round, and reactive to light. Right eye exhibits no discharge. Left eye exhibits no discharge. No scleral icterus.  Neck: Normal range of motion. Neck supple. No tracheal deviation present. No thyromegaly present.  Cardiovascular: Normal rate, regular rhythm, normal heart sounds and intact distal pulses.  Exam reveals no gallop and no friction rub.   No murmur heard. Respiratory: Effort normal and breath sounds normal. No respiratory distress. She has no wheezes. She has no rales. She exhibits no tenderness.  GI: Soft. Bowel sounds are normal. She exhibits no distension and no mass. There is tenderness.  There is no rebound and no guarding.  Genitourinary:       Vulva is normal without lesions Vagina is pink moist without discharge Cervix normal in appearance and pap is normal Uterus is normal size, contour, position, consistency, mobility, non-tender Adnexa is negative with normal sized ovaries by sonogram  Musculoskeletal: Normal range of motion. She exhibits no edema and no tenderness.  Neurological: She is alert and oriented to person, place, and time. She has normal reflexes. She displays normal reflexes. No cranial nerve deficit. She exhibits normal muscle tone. Coordination normal.  Skin: Skin is warm and dry. No rash noted. No erythema. No pallor.  Psychiatric: She has a normal  mood and affect. Her behavior is normal. Judgment and thought content normal.    Labs: Results for orders placed or performed during the hospital encounter of 07/26/22 (from the past 336 hour(s))  CBC   Collection Time: 07/26/22  8:54 AM  Result Value Ref Range   WBC 10.5 4.0 - 10.5 K/uL   RBC 5.00 3.87 - 5.11 MIL/uL   Hemoglobin 12.5 12.0 - 15.0 g/dL   HCT 38.2 36.0 - 46.0 %   MCV 76.4 (L) 80.0 - 100.0 fL   MCH 25.0 (L) 26.0 - 34.0 pg   MCHC 32.7 30.0 - 36.0 g/dL   RDW 22.7 (H) 11.5 - 15.5 %   Platelets 477 (H) 150 - 400 K/uL   nRBC 0.0 0.0 - 0.2 %  Comprehensive metabolic panel   Collection Time: 07/26/22  8:54 AM  Result Value Ref Range   Sodium 131 (L) 135 - 145 mmol/L   Potassium 2.7 (LL) 3.5 - 5.1 mmol/L   Chloride 104 98 - 111 mmol/L   CO2 20 (L) 22 - 32 mmol/L   Glucose, Bld 100 (H) 70 - 99 mg/dL   BUN 10 6 - 20 mg/dL   Creatinine, Ser 0.78 0.44 - 1.00 mg/dL   Calcium 8.7 (L) 8.9 - 10.3 mg/dL   Total Protein 7.8 6.5 - 8.1 g/dL   Albumin 3.8 3.5 - 5.0 g/dL   AST 14 (L) 15 - 41 U/L   ALT 13 0 - 44 U/L   Alkaline Phosphatase 61 38 - 126 U/L   Total Bilirubin 0.4 0.3 - 1.2 mg/dL   GFR, Estimated >60 >60 mL/min   Anion gap 7 5 - 15  Rapid HIV screen (HIV 1/2 Ab+Ag)   Collection Time:  07/26/22  8:54 AM  Result Value Ref Range   HIV-1 P24 Antigen - HIV24 NON REACTIVE NON REACTIVE   HIV 1/2 Antibodies NON REACTIVE NON REACTIVE   Interpretation (HIV Ag Ab)      A non reactive test result means that HIV 1 or HIV 2 antibodies and HIV 1 p24 antigen were not detected in the specimen.  Pregnancy, urine   Collection Time: 07/26/22  8:54 AM  Result Value Ref Range   Preg Test, Ur NEGATIVE NEGATIVE  Type and screen   Collection Time: 07/26/22  8:54 AM  Result Value Ref Range   ABO/RH(D) O POS    Antibody Screen NEG    Sample Expiration 08/09/2022,2359    Extend sample reason      NO TRANSFUSIONS OR PREGNANCY IN THE PAST 3 MONTHS Performed at [facility], [address], [place], [place] [address]   Urinalysis, Routine w reflex microscopic -Urine, Clean Catch   Collection Time: 07/26/22  9:43 AM  Result Value Ref Range   Color, Urine YELLOW YELLOW   APPearance CLOUDY (A) CLEAR   Specific Gravity, Urine 1.004 (L) 1.005 - 1.030   pH 7.0 5.0 - 8.0   Glucose, UA NEGATIVE NEGATIVE mg/dL   Hgb urine dipstick SMALL (A) NEGATIVE   Bilirubin Urine NEGATIVE NEGATIVE   Ketones, ur NEGATIVE NEGATIVE mg/dL   Protein, ur NEGATIVE NEGATIVE mg/dL   Nitrite NEGATIVE NEGATIVE   Leukocytes,Ua LARGE (A) NEGATIVE   RBC / HPF 21-50 0 - 5 RBC/hpf   WBC, UA >50 0 - 5 WBC/hpf   Bacteria, UA FEW (A) NONE SEEN   Squamous Epithelial / HPF 6-10 0 - 5 /HPF    EKG: Orders placed or performed during the hospital encounter of  07/26/22   EKG 12-LEAD   EKG 12-LEAD    Imaging Studies: No results found.    Assessment: Heavy prolonged menstrual bleeding Anemia, responsive to megestrol and iron(was 7.7 04/2022) Dysmenorrhea  Plan: Xi Robotic assisted laparoscopic hysterectomy with bilateral salpingectomy(for ovarian cancer prophylaxis)  Pt understands the risks of surgery including but not limited t  excessive bleeding requiring transfusion or reoperation, post-operative infection  requiring prolonged hospitalization or re-hospitalization and antibiotic therapy, and damage to other organs including bladder, bowel, ureters and major vessels.  The patient also understands the alternative treatment options which were discussed in full.  All questions were answered.  Mertie Clause Treina Arscott 07/30/2022 7:00 AM   Florian Buff 07/30/2022 6:57 AM

## 2022-07-30 NOTE — Op Note (Signed)
PREOPERATIVE DIAGNOSES: 1.  Menometrorrhagia 2.  Anemia, responsive to megestrol and iron 3.  Dysmenorrhea    POSTOPERATIVE DIAGNOSES: SAA     OPERATION PERFORMED: 1. Xi Robotic assisted laparoscopic  total hysterectomy with bilateral salpingectomy.    SURGEON: Florian Buff, MD    ANESTHESIA: General.   Findings: Normal uterus tubes and ovaries and peritoneal surfaces   Description of operation: Patient was taken to the operating room where she underwent general endotracheal anesthesia She was placed in the low lithotomy position She was prepped and draped in the usual sterile fashion A Foley catheter was placed A large cupped Vcare was placed into the uterine cavity and the balloon inflated for intraoperative uterine manipulation An incision was made superior to the umbilicus and dissection taken down to the rectus fascia The fascia was grasped with a single Coker clamp and A Veres needle was placed into the peritoneal cavity with 1 pass without difficulty and a pneumoperitoneum was created with CO2 gas An 8 mm nonbladed trocar port was placed into the peritoneal cavity with 1 pass without difficulty using the video laparoscope An additional 8 mm nonbladed trocar port was placed just below the level of the first port, on the left side of the patient lateral to the rectus anterior abdominal muscle under direct visualization without difficulty An additional 8 mm nonbladed trocar port was placed just below the level of the first port, on the right side of the patient lateral to the rectus anterior abdominal muscle under direct visualization without difficulty An 8 mm air seal assist port was placed under direct visualization between the umbilical and left lateral port 4 centimeters superior to the level of the umbilical port   The daVinci Xi Robot was then targeted and docked without difficulty The vessel sealer extender, 30 degree scope, and monopolar scissors were placed into  the operative ports. The air seal port was used as an Environmental consultant port I then went to the operative console to begin the case   The uterus tubes and ovaries all appeared to be normal There was no intraperitoneal adhesive disease Both ureters and retroperitoneal anatomy were visualized transperitoneally The right utero-ovarian ligament was transected using the VSE. Additional pedicles were taken which included the round ligament and down the broad ligament laterally to the medial I stopped short of the uterine vessels on the right and then used the monopolar scissors to develop the bladder peritoneum Turned my attention to the left using the VSE to seal and cut the utero-ovarian ligament, the round ligament and the peritoneum of the broad ligament again stopping short of the uterine vessels on the left I then used the monopolar scissors to develop the Vesico uterine peritoneum on the left joining the right The bladder was taken off the lower uterine segment and cervix using tension, blunt dissection and monopolar cautery with good result.  Of course care was taken not to apply monopolar energy to the bladder itself The VSE was then  bilaterally to seal the uterine vessels at multiple levels and to transect at the level of the uterine isthmus The VSE was then used medial to the transected uterine vessels along the cervical edge as well The monopolar scissors were used and anterior and posterior colpotomy incision was made Monopolar scissors and the VSE using bipolar energy were used to develop the lateral cuff preserving the cardinal ligament The posterior colpotomy was made above the uterosacral ligaments to preserve rectovaginal support Bilateral salpingectomies were performed using the  VSE with good hemostasis All pedicles were found to be hemostatic The uterus and fallopian tubes were removed through the vagina The monopolar scissors were removed and Megacut suture driver was placed 2-0  stratafix symmetrical on a CT 2 needle into the was placed into the peritoneal cavity and used to closed the vaginal cuff in the usual fashion  At the end of the cuff closure there was excellent support including the uterosacral and cardinal ligaments Again all pedicles were found to be hemostatic   I was then finished with the operative console and went back to the bedside All ports were removed under direct visualization And the abdomen was actively desufflated   All 4 incisions were then closed Dermabond was placed on each incision A total of 266 mg of Exparel was injected into the 4 incisions for postoperative pain management   Estimated blood loss for the procedure was 25 cc The patient received Ancef 2 grams preoperatively as well as Toradol   The patient was taken to the PACU in good stable condition   The plan is for discharge from the [place], MD 07/30/2022 11:47 AM

## 2022-07-31 LAB — POCT I-STAT, CHEM 8
BUN: 7 mg/dL (ref 6–20)
Calcium, Ion: 1.17 mmol/L (ref 1.15–1.40)
Chloride: 110 mmol/L (ref 98–111)
Creatinine, Ser: 0.6 mg/dL (ref 0.44–1.00)
Glucose, Bld: 91 mg/dL (ref 70–99)
HCT: 38 % (ref 36.0–46.0)
Hemoglobin: 12.9 g/dL (ref 12.0–15.0)
Potassium: 4.1 mmol/L (ref 3.5–5.1)
Sodium: 141 mmol/L (ref 135–145)
TCO2: 21 mmol/L — ABNORMAL LOW (ref 22–32)

## 2022-07-31 NOTE — Anesthesia Postprocedure Evaluation (Signed)
Anesthesia Post Note  Patient: Kristin Baker  Procedure(s) Performed: XI ROBOTIC ASSISTED TOTAL HYSTERECTOMY AND BILATERAL SALPINGECTOMY (Bilateral: Abdomen)  Patient location during evaluation: Phase II Anesthesia Type: General Level of consciousness: awake Pain management: pain level controlled Vital Signs Assessment: post-procedure vital signs reviewed and stable Respiratory status: spontaneous breathing and respiratory function stable Cardiovascular status: blood pressure returned to baseline and stable Postop Assessment: no headache and no apparent nausea or vomiting Anesthetic complications: no Comments: Late entry   No notable events documented.   Last Vitals:  Vitals:   07/30/22 1300 07/30/22 1325  BP: 98/62 115/76  Pulse: 64 69  Resp: 12 14  Temp:  36.6 C  SpO2: 98% 99%    Last Pain:  Vitals:   07/30/22 1325  TempSrc: Oral  PainSc: [place]

## 2022-08-01 LAB — SURGICAL PATHOLOGY

## 2022-08-08 ENCOUNTER — Inpatient Hospital Stay: Payer: Medicaid Other | Attending: Hematology | Admitting: Licensed Clinical Social Worker

## 2022-08-08 DIAGNOSIS — D72829 Elevated white blood cell count, unspecified: Secondary | ICD-10-CM | POA: Insufficient documentation

## 2022-08-08 DIAGNOSIS — L299 Pruritus, unspecified: Secondary | ICD-10-CM | POA: Insufficient documentation

## 2022-08-08 DIAGNOSIS — D509 Iron deficiency anemia, unspecified: Secondary | ICD-10-CM | POA: Insufficient documentation

## 2022-08-08 DIAGNOSIS — I7300 Raynaud's syndrome without gangrene: Secondary | ICD-10-CM | POA: Insufficient documentation

## 2022-08-08 DIAGNOSIS — E039 Hypothyroidism, unspecified: Secondary | ICD-10-CM | POA: Insufficient documentation

## 2022-08-08 DIAGNOSIS — N939 Abnormal uterine and vaginal bleeding, unspecified: Secondary | ICD-10-CM | POA: Insufficient documentation

## 2022-08-08 DIAGNOSIS — D75839 Thrombocytosis, unspecified: Secondary | ICD-10-CM | POA: Insufficient documentation

## 2022-08-09 ENCOUNTER — Encounter: Payer: Self-pay | Admitting: Obstetrics & Gynecology

## 2022-08-09 ENCOUNTER — Ambulatory Visit (INDEPENDENT_AMBULATORY_CARE_PROVIDER_SITE_OTHER): Payer: Medicaid Other | Admitting: Obstetrics & Gynecology

## 2022-08-09 ENCOUNTER — Inpatient Hospital Stay: Payer: Medicaid Other

## 2022-08-09 VITALS — BP 130/88 | HR 86 | Ht 66.0 in | Wt 294.0 lb

## 2022-08-09 DIAGNOSIS — Z9889 Other specified postprocedural states: Secondary | ICD-10-CM

## 2022-08-09 NOTE — Progress Notes (Signed)
HPI: Patient returns for routine postoperative follow-up having undergone Xi robotic assisted laparoscopic hysterectomy with bilateral salpingectomy on 07/30/22.  The patient's immediate postoperative recovery has been unremarkable. Since hospital discharge the patient reports no problems, some pelvic pressure.   Current Outpatient Medications: acetaminophen (TYLENOL) 500 MG tablet, Take 500-1,000 mg by mouth every 6 (six) hours as needed for moderate pain., Disp: , Rfl:  amphetamine-dextroamphetamine (ADDERALL XR) 20 MG 24 hr capsule, Take 20 mg by mouth daily., Disp: , Rfl:  amphetamine-dextroamphetamine (ADDERALL) 5 MG tablet, Take 5 mg by mouth daily in the afternoon., Disp: , Rfl:  azelastine (ASTELIN) 0.1 % nasal spray, 2 sprays per nostril 1-2 times daily as needed., Disp: 30 mL, Rfl: 5 Cholecalciferol (VITAMIN D3) 25 MCG (1000 UT) CAPS, Take 1 capsule (1,000 Units total) by mouth daily., Disp: 30 capsule, Rfl: 3 fluticasone (FLOVENT HFA) 110 MCG/ACT inhaler, Inhale 2 puffs into the lungs 2 (two) times daily. With spacer., Disp: 1 each, Rfl: 5 Galcanezumab-gnlm (EMGALITY) 120 MG/ML SOAJ, Inject 1 pen  into the skin every 30 (thirty) days., Disp: 3 mL, Rfl: 3 hydrochlorothiazide (HYDRODIURIL) 12.5 MG tablet, TAKE 1 TABLET(12.5 MG) BY MOUTH DAILY, Disp: 90 tablet, Rfl: 0 ibuprofen (ADVIL) 200 MG tablet, Take 400 mg by mouth every 6 (six) hours as needed for moderate pain., Disp: , Rfl:  levothyroxine (SYNTHROID) 112 MCG tablet, Take 1 tablet (112 mcg total) by mouth daily., Disp: 90 tablet, Rfl: 3 rizatriptan (MAXALT-MLT) 10 MG disintegrating tablet, DISSOLVE ONE TABLET BY MOUTH AS NEEDED FOR MIGRAINE. MAY REPEAT IN 2 HOURS IF NEEDED. NO MORE THAN 2 TABLETS DAILY OR 10 TABLETS A MONTH., Disp: 10 tablet, Rfl: 5 tiZANidine (ZANAFLEX) 4 MG tablet, Take 4 mg by mouth at bedtime as needed for muscle spasms., Disp: , Rfl:  triamcinolone (NASACORT) 55 MCG/ACT AERO nasal inhaler, Place 1 spray into  the nose daily., Disp: 16.9 mL, Rfl: 5 VENTOLIN HFA 108 (90 Base) MCG/ACT inhaler, INHALE 2 PUFFS INTO THE LUNGS EVERY 4 HOURS AS NEEDED FOR WHEEZING OR SHORTNESS OF BREATH OR COUGH, Disp: 18 g, Rfl: 1 ketorolac (TORADOL) 10 MG tablet, Take 1 tablet (10 mg total) by mouth every 8 (eight) hours as needed. (Patient not taking: Reported on 08/09/2022), Disp: 15 tablet, Rfl: 0 levocetirizine (XYZAL) 5 MG tablet, Take 1 tablet (5 mg total) by mouth in the morning and at bedtime. (Patient taking differently: Take 5 mg by mouth daily.), Disp: 60 tablet, Rfl: 5 ondansetron (ZOFRAN-ODT) 8 MG disintegrating tablet, Take 1 tablet (8 mg total) by mouth every 8 (eight) hours as needed for nausea or vomiting. (Patient not taking: Reported on 08/09/2022), Disp: 8 tablet, Rfl: 0 oxyCODONE-acetaminophen (PERCOCET) 5-325 MG tablet, Take 1 tablet by mouth every 4 (four) hours as needed for severe pain. (Patient not taking: Reported on 08/09/2022), Disp: 28 tablet, Rfl: 0 potassium chloride SA (KLOR-CON M) 20 MEQ tablet, Take 1 tablet (20 mEq total) by mouth 3 (three) times daily. (Patient not taking: Reported on 08/09/2022), Disp: 90 tablet, Rfl: 1  No current facility-administered medications for this visit.    Blood pressure 130/88, pulse 86, height 5\' 6"  (1.676 m), weight 294 lb (133.4 kg), last menstrual period 03/10/2022.  Physical Exam: Abdomen is benign post op 4 incisions all look good Vaginal cuff is intact  No cuff tenderness discharge of masses at the cuff  Diagnostic Tests:   Pathology: benign  Impression + Management plan:   ICD-10-CM   1. Post-operative state: Xi robotic hysterectomy 07/30/22  Z98.890  doing well post op         Medications Prescribed this encounter: No orders of the defined types were placed in this encounter.     Follow up: 5 weeks   Florian Buff, MD Attending Physician for the [facility] 08/09/2022 9:23  AM

## 2022-08-12 ENCOUNTER — Inpatient Hospital Stay: Payer: Medicaid Other

## 2022-08-12 DIAGNOSIS — D509 Iron deficiency anemia, unspecified: Secondary | ICD-10-CM | POA: Diagnosis present

## 2022-08-12 DIAGNOSIS — N939 Abnormal uterine and vaginal bleeding, unspecified: Secondary | ICD-10-CM | POA: Diagnosis not present

## 2022-08-12 DIAGNOSIS — D72829 Elevated white blood cell count, unspecified: Secondary | ICD-10-CM | POA: Diagnosis not present

## 2022-08-12 DIAGNOSIS — D75839 Thrombocytosis, unspecified: Secondary | ICD-10-CM

## 2022-08-12 DIAGNOSIS — D500 Iron deficiency anemia secondary to blood loss (chronic): Secondary | ICD-10-CM

## 2022-08-12 DIAGNOSIS — E039 Hypothyroidism, unspecified: Secondary | ICD-10-CM | POA: Diagnosis not present

## 2022-08-12 DIAGNOSIS — L299 Pruritus, unspecified: Secondary | ICD-10-CM | POA: Diagnosis not present

## 2022-08-12 DIAGNOSIS — I7300 Raynaud's syndrome without gangrene: Secondary | ICD-10-CM | POA: Diagnosis not present

## 2022-08-12 LAB — CBC WITH DIFFERENTIAL/PLATELET
Abs Immature Granulocytes: 0.03 10*3/uL (ref 0.00–0.07)
Basophils Absolute: 0.1 10*3/uL (ref 0.0–0.1)
Basophils Relative: 1 %
Eosinophils Absolute: 0.2 10*3/uL (ref 0.0–0.5)
Eosinophils Relative: 2 %
HCT: 37 % (ref 36.0–46.0)
Hemoglobin: 12 g/dL (ref 12.0–15.0)
Immature Granulocytes: 0 %
Lymphocytes Relative: 24 %
Lymphs Abs: 2.6 10*3/uL (ref 0.7–4.0)
MCH: 25.8 pg — ABNORMAL LOW (ref 26.0–34.0)
MCHC: 32.4 g/dL (ref 30.0–36.0)
MCV: 79.6 fL — ABNORMAL LOW (ref 80.0–100.0)
Monocytes Absolute: 0.7 10*3/uL (ref 0.1–1.0)
Monocytes Relative: 7 %
Neutro Abs: 7.3 10*3/uL (ref 1.7–7.7)
Neutrophils Relative %: 66 %
Platelets: 460 10*3/uL — ABNORMAL HIGH (ref 150–400)
RBC: 4.65 MIL/uL (ref 3.87–5.11)
WBC Morphology: REACTIVE
WBC: 10.8 10*3/uL — ABNORMAL HIGH (ref 4.0–10.5)
nRBC: 0 % (ref 0.0–0.2)

## 2022-08-12 LAB — IRON AND TIBC
Iron: 29 ug/dL (ref 28–170)
Saturation Ratios: 9 % — ABNORMAL LOW (ref 10.4–31.8)
TIBC: 319 ug/dL (ref 250–450)
UIBC: 290 ug/dL

## 2022-08-12 LAB — FERRITIN: Ferritin: 52 ng/mL (ref 11–307)

## 2022-08-14 ENCOUNTER — Inpatient Hospital Stay: Payer: Medicaid Other

## 2022-08-14 ENCOUNTER — Inpatient Hospital Stay: Payer: Medicaid Other | Admitting: Physician Assistant

## 2022-08-15 ENCOUNTER — Inpatient Hospital Stay (HOSPITAL_BASED_OUTPATIENT_CLINIC_OR_DEPARTMENT_OTHER): Payer: Medicaid Other | Admitting: Hematology

## 2022-08-15 VITALS — BP 145/95 | HR 83 | Temp 98.5°F | Resp 18 | Ht 66.0 in | Wt 292.8 lb

## 2022-08-15 DIAGNOSIS — D75839 Thrombocytosis, unspecified: Secondary | ICD-10-CM

## 2022-08-15 DIAGNOSIS — D500 Iron deficiency anemia secondary to blood loss (chronic): Secondary | ICD-10-CM | POA: Diagnosis not present

## 2022-08-15 NOTE — Patient Instructions (Addendum)
[place] at [facility] Discharge Instructions   You were seen and examined today by Dr. Delton Coombes.  He reviewed the results of your lab work which are mostly normal/stable.   Start taking iron tablet (325 mg over the counter) daily.   We will make a referral for you to a rheumatologist.   Return as scheduled.    Thank you for choosing [place] at [facility] to provide your oncology and hematology care.  To afford each patient quality time with our provider, please arrive at least 15 minutes before your scheduled appointment time.   If you have a lab appointment with the [facility] please come in thru the Main Entrance and check in at the main information desk.  You need to re-schedule your appointment should you arrive 10 or more minutes late.  We strive to give you quality time with our providers, and arriving late affects you and other patients whose appointments are after yours.  Also, if you no show three or more times for appointments you may be dismissed from the clinic at the providers discretion.     Again, thank you for choosing [facility]  Our hope is that these requests will decrease the amount of time that you wait before being seen by our physicians.       _____________________________________________________________  Should you have questions after your visit to [facility], please contact our office at [phone] and follow the prompts.  Our office hours are 8:00 a.m. and 4:30 p.m. Monday - Friday.  Please note that voicemails left after 4:00 p.m. may not be returned until the following business day.  We are closed weekends and major holidays.  You do have access to a nurse 24-7, just call the main number to the clinic [phone] and do not press any options, hold on the line and a nurse will answer the phone.    For prescription refill requests, have your pharmacy contact our office and  allow 72 hours.    Due to Covid, you will need to wear a mask upon entering the hospital. If you do not have a mask, a mask will be given to you at the Main Entrance upon arrival. For doctor visits, patients may have 1 support person age 90 or older with them. For treatment visits, patients can not have anyone with them due to social distancing guidelines and our immunocompromised population.

## 2022-08-15 NOTE — Progress Notes (Signed)
[place], [place] [address]    Clinic Day:  08/15/2022  Referring physician: [name], [place]  Patient Care Team: [name], [place] as PCP - General (Family Medicine) Sueanne Margarita, MD as PCP - Cardiology (Cardiology) Greg Cutter, LCSW as [place] Management (Licensed Clinical Social Worker) Melissa Montane, RN as Case Manager   ASSESSMENT & PLAN:   Assessment: 1.  Thrombocytosis - Progressively elevated platelets since February 2022. Labs from 05/24/2022 show platelets 607 in the context of borderline anemia (Hgb 10.4) and borderline microcytosis (MCV 80). Iron studies (05/06/2022) show iron deficiency with ferritin 18 and iron saturation 5%. - She vapes daily.  She is morbidly obese.  She has been referred to rheumatology for possible autoimmune disease (thoracic spine pain, ANA positive). - No history of DVT or PE. - No B symptoms.  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 - DIFFERENTIAL DIAGNOSIS favors reactive thrombocytosis in the setting of iron deficiency, vaping, obesity, and possible autoimmune/inflammatory disease. - JAK2 V617F and reflex testing was negative.   2.  Iron deficiency anemia -Follows with GYN for severe abnormal uterine bleeding (bleeding "nonstop") since October 2023, currently megestrol.  Considering possible hysterectomy - Intermittent hemorrhoid bleeding x 2 years, which is at times significant - Previously donated blood regularly, no blood donation for the past year. - No prior history of blood transfusion. - Taking iron tablet daily since November 2023. - TAH 2 weeks ago.  She also received iron in January.  No improvement in energy levels.   3.  Family history - Strong family history of breast, uterine, and ovarian cancer on her mother's side of the family: Mother had endometrial cancer Maternal aunt with breast cancer Maternal aunt with ovarian cancer and  breast cancer Maternal grandmother with breast cancer and uterine cancer.     4.  Other history - PMH: Abnormal uterine bleeding, ADHD, PCOS, degenerative disc disease, hypertension, hypothyroidism, asthma, morbid obesity, anxiety/depression - SOCIAL: She lives at home with her husband and daughter.  She is currently unemployed.  She is a former smoker, switched to vaping 2 years ago and vapes frequently throughout the day.  She drinks occasional alcohol.  She denies any illicit drug use. - FAMILY: Strong family history of breast, uterine, and ovarian cancer on mother side of the family, as noted above.  Plan:  1.  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  ANA and rheumatoid factor were negative. - She had previously positive ANA and pain and swelling of hand joints and pain in the hips and shoulders. - Platelet count has improved from 580 to 460 after 2 infusions of iron. - We will make referral to rheumatology.  2.  Iron deficiency state: - After 2 infusions of Feraheme, her hemoglobin improved.  However  energy levels are severely low. - I have recommended 1 more infusion of Feraheme. - She was also instructed to start iron tablet 3 times weekly. - RTC 4 months with repeat CBC, ferritin and iron panel.   Orders Placed This Encounter  Procedures   CBC    Standing Status:   Future    Standing Expiration Date:   08/15/2023   Iron and TIBC ([facility] ONLY)    Standing Status:   Future    Standing Expiration Date:   08/15/2023   Ferritin    Standing Status:   Future  Standing Expiration Date:   08/15/2023   C-reactive protein    Standing Status:   Future    Standing Expiration Date:   08/15/2023   Sedimentation rate    Standing Status:   Future    Standing Expiration Date:   08/15/2023      I,Alexis Herring,acting as a scribe for Derek Jack,  MD.,have documented all relevant documentation on the behalf of Derek Jack, MD,as directed by  Derek Jack, MD while in the presence of Derek Jack, MD.   I, Derek Jack MD, have reviewed the above documentation for accuracy and completeness, and I agree with the above.   Derek Jack, MD   3/21/20246:19 PM  CHIEF COMPLAINT:   Diagnosis: thrombocytosis and IDA   Cancer Staging  No matching staging information was found for the patient.   Prior Therapy: po iron  Current Therapy:  Feraheme infusions  INTERVAL HISTORY:   Kristin Baker is a 32 y.o. female presenting to clinic today for follow up of thrombocytosis and IDA. She was last seen by me on 06/19/22 for consult.  She had a TAH and bilateral salpingectomy on 07/30/22 with Dr. Tania Ade and had her postop follow up OV on 08/09/22. She states that she had to take potassium supplements for a few days prior to surgery as her K dropped to 2.7.  Today, she states that she is doing well overall. Her appetite level is at 80%. Her energy level is at 40%. She reports that she is often fatigued despite getting plenty of sleep. She denies any improvement in her energy level after feraheme infusion on 06/25/22. She denies any negative reactions to the infusion.  She had a positive ANA in 12/2019 and she was told that it was likely due to her hypothyroidism. Her last ANA on 06/19/22 was negative.  Patient states that she has arthritis in her hands, hips, and shoulders. She reports having routine SI cortisone injections as well as shoulder injections. She reports bilateral hand swelling since 01/2022.   She states that she saw rheumatology Dr. Estanislado Pandy in 2022 and was told that she did not have RA.  Her labs on 06/19/22 showed: rheumatoid factor <10, CRP of 2.2, and sed rate of 54.  PAST MEDICAL HISTORY:   Past Medical History: Past Medical History:  Diagnosis Date   Abdominal pain    ADHD    ANA positive     Back pain    DDD (degenerative disc disease), lumbar    Eye pain, right    Fatigue    Frontal sinusitis    Hypertension    Hypothyroidism    Iron deficiency anemia due to chronic blood loss 06/19/2022   Medical history non-contributory    Obesity, morbid ([place])    Palpitations    Rheumatoid arthritis ([place])     Surgical History: Past Surgical History:  Procedure Laterality Date   ABLATION     05/22/2020 left side, 06/14/2020 right side    HYSTERECTOMY ABDOMINAL WITH SALPINGECTOMY     ROBOTIC ASSISTED TOTAL HYSTERECTOMY Bilateral 07/30/2022   Procedure: XI ROBOTIC ASSISTED TOTAL HYSTERECTOMY AND BILATERAL SALPINGECTOMY;  Surgeon: Florian Buff, MD;  Location: [facility];  Service: Gynecology;  Laterality: Bilateral;    Social History: Social History   Socioeconomic History   Marital status: Married    Spouse name: Not on file   Number of children: 1   Years of education: Not on file   Highest education level: Associate degree: academic program  Occupational History  Not on file  Tobacco Use   Smoking status: Former    Packs/day: 1.00    Years: 15.00    Additional pack years: 0.00    Total pack years: 15.00    Types: Cigarettes    Quit date: 11/24/2020    Years since quitting: 1.7   Smokeless tobacco: Never   Tobacco comments:    She has quit of and on.  Vaping Use   Vaping Use: Every day  Substance and Sexual Activity   Alcohol use: Yes    Comment: occasionally /social events   Drug use: Not Currently    Comment: smoked weed during teen years   Sexual activity: Not Currently    Birth control/protection: Surgical    Comment: hyst  Other Topics Concern   Not on file  Social History Narrative   Married for May 2021.Lives with husband and daughter.Homemaker.Husband works at [employer].   Social Determinants of Health   Financial Resource Strain: Low Risk  (09/05/2020)   Overall Financial Resource Strain (CARDIA)    Difficulty of Paying Living Expenses:  Not very hard  Food Insecurity: No Food Insecurity (07/29/2022)   Hunger Vital Sign    Worried About Running Out of Food in the Last Year: Never true    Ran Out of Food in the Last Year: Never true  Transportation Needs: No Transportation Needs (07/29/2022)   PRAPARE - Hydrologist (Medical): No    Lack of Transportation (Non-Medical): No  Physical Activity: Insufficiently Active (09/05/2020)   Exercise Vital Sign    Days of Exercise per Week: 3 days    Minutes of Exercise per Session: 30 min  Stress: No Stress Concern Present (11/07/2021)   Agenda    Feeling of Stress : Only a little  Recent Concern: Stress - Stress Concern Present (09/07/2021)   [place]    Feeling of Stress : To some extent  Social Connections: Moderately Isolated (09/05/2020)   Social Connection and Isolation Panel [NHANES]    Frequency of Communication with Friends and Family: More than three times a week    Frequency of Social Gatherings with Friends and Family: Twice a week    Attends Religious Services: Never    Marine scientist or Organizations: No    Attends Archivist Meetings: Never    Marital Status: Married  Human resources officer Violence: Not At Risk (06/19/2022)   Humiliation, Afraid, Rape, and Kick questionnaire    Fear of Current or Ex-Partner: No    Emotionally Abused: No    Physically Abused: No    Sexually Abused: No    Family History: Family History  Problem Relation Age of Onset   Cervical cancer Maternal Grandmother    Breast cancer Maternal Grandmother    Skin cancer Maternal Grandmother    Heart attack Maternal Grandfather    Diabetes Father    Heart attack Father    Hyperlipidemia Mother    Peripheral Artery Disease Mother    ADD / ADHD Mother    Bipolar disorder Mother    Cervical cancer Mother        at age  56.   ADD / ADHD Sister    Healthy Daughter    Breast cancer Maternal Aunt    Cervical cancer Maternal Aunt    ADD / ADHD Maternal Aunt    Uterine cancer Maternal Aunt  Breast cancer Maternal Aunt    Multiple sclerosis Paternal Aunt    Colon cancer Neg Hx     Current Medications:  Current Outpatient Medications:    acetaminophen (TYLENOL) 500 MG tablet, Take 500-1,000 mg by mouth every 6 (six) hours as needed for moderate pain., Disp: , Rfl:    amphetamine-dextroamphetamine (ADDERALL XR) 20 MG 24 hr capsule, Take 20 mg by mouth daily., Disp: , Rfl:    amphetamine-dextroamphetamine (ADDERALL) 5 MG tablet, Take 5 mg by mouth daily in the afternoon., Disp: , Rfl:    azelastine (ASTELIN) 0.1 % nasal spray, 2 sprays per nostril 1-2 times daily as needed., Disp: 30 mL, Rfl: 5   Cholecalciferol (VITAMIN D3) 25 MCG (1000 UT) CAPS, Take 1 capsule (1,000 Units total) by mouth daily., Disp: 30 capsule, Rfl: 3   fluticasone (FLOVENT HFA) 110 MCG/ACT inhaler, Inhale 2 puffs into the lungs 2 (two) times daily. With spacer., Disp: 1 each, Rfl: 5   Galcanezumab-gnlm (EMGALITY) 120 MG/ML SOAJ, Inject 1 pen  into the skin every 30 (thirty) days., Disp: 3 mL, Rfl: 3   hydrochlorothiazide (HYDRODIURIL) 12.5 MG tablet, TAKE 1 TABLET(12.5 MG) BY MOUTH DAILY, Disp: 90 tablet, Rfl: 0   ibuprofen (ADVIL) 200 MG tablet, Take 400 mg by mouth every 6 (six) hours as needed for moderate pain., Disp: , Rfl:    levothyroxine (SYNTHROID) 112 MCG tablet, Take 1 tablet (112 mcg total) by mouth daily., Disp: 90 tablet, Rfl: 3   potassium chloride SA (KLOR-CON M) 20 MEQ tablet, Take 1 tablet (20 mEq total) by mouth 3 (three) times daily., Disp: 90 tablet, Rfl: 1   rizatriptan (MAXALT-MLT) 10 MG disintegrating tablet, DISSOLVE ONE TABLET BY MOUTH AS NEEDED FOR MIGRAINE. MAY REPEAT IN 2 HOURS IF NEEDED. NO MORE THAN 2 TABLETS DAILY OR 10 TABLETS A MONTH., Disp: 10 tablet, Rfl: 5   tiZANidine (ZANAFLEX) 4 MG tablet, Take 4 mg  by mouth at bedtime as needed for muscle spasms., Disp: , Rfl:    triamcinolone (NASACORT) 55 MCG/ACT AERO nasal inhaler, Place 1 spray into the nose daily., Disp: 16.9 mL, Rfl: 5   VENTOLIN HFA 108 (90 Base) MCG/ACT inhaler, INHALE 2 PUFFS INTO THE LUNGS EVERY 4 HOURS AS NEEDED FOR WHEEZING OR SHORTNESS OF BREATH OR COUGH, Disp: 18 g, Rfl: 1   levocetirizine (XYZAL) 5 MG tablet, Take 1 tablet (5 mg total) by mouth in the morning and at bedtime. (Patient taking differently: Take 5 mg by mouth daily.), Disp: 60 tablet, Rfl: 5   Allergies: Allergies  Allergen Reactions   Poison Ivy Extract Anaphylaxis   Poison Oak Extract Anaphylaxis   Flexeril [Cyclobenzaprine] Hives   Lactose Intolerance (Gi) Diarrhea   Honeysuckle Flower [Lonicera] Rash   Mixed Grasses Rash    REVIEW OF SYSTEMS:   Review of Systems  Constitutional:  Negative for chills, fatigue and fever.  HENT:   Negative for lump/mass, mouth sores, nosebleeds, sore throat and trouble swallowing.   Eyes:  Negative for eye problems.  Respiratory:  Positive for shortness of breath. Negative for cough.   Cardiovascular:  Negative for chest pain, leg swelling and palpitations.  Gastrointestinal:  Negative for abdominal pain, constipation, diarrhea, nausea and vomiting.  Genitourinary:  Negative for bladder incontinence, difficulty urinating, dysuria, frequency, hematuria and nocturia.   Musculoskeletal:  Positive for arthralgias (chronic- arthritis). Negative for back pain, flank pain, myalgias and neck pain.  Skin:  Negative for itching and rash.  Neurological:  Negative for dizziness, headaches  and numbness.  Hematological:  Does not bruise/bleed easily.  Psychiatric/Behavioral:  Positive for depression. Negative for sleep disturbance and suicidal ideas. The patient is nervous/anxious.   All other systems reviewed and are negative.    VITALS:   Blood pressure (!) 145/95, pulse 83, temperature 98.5 F (36.9 C), temperature source  Oral, resp. rate 18, height 5\' 6"  (1.676 m), weight 292 lb 12.8 oz (132.8 kg), last menstrual period 03/10/2022, SpO2 97 %.  Wt Readings from Last 3 Encounters:  08/15/22 292 lb 12.8 oz (132.8 kg)  08/09/22 294 lb (133.4 kg)  07/26/22 290 lb (131.5 kg)    Body mass index is 47.26 kg/m.  Performance status (ECOG): 1 - Symptomatic but completely ambulatory  PHYSICAL EXAM:   Physical Exam Vitals and nursing note reviewed. Exam conducted with a chaperone present.  Constitutional:      Appearance: Normal appearance.  Cardiovascular:     Rate and Rhythm: Normal rate and regular rhythm.     Pulses: Normal pulses.     Heart sounds: Normal heart sounds.  Pulmonary:     Effort: Pulmonary effort is normal.     Breath sounds: Normal breath sounds.  Abdominal:     Palpations: Abdomen is soft. There is no hepatomegaly, splenomegaly or mass.     Tenderness: There is no abdominal tenderness.  Musculoskeletal:     Right lower leg: No edema.     Left lower leg: No edema.  Lymphadenopathy:     Cervical: No cervical adenopathy.     Right cervical: No superficial, deep or posterior cervical adenopathy.    Left cervical: No superficial, deep or posterior cervical adenopathy.     Upper Body:     Right upper body: No supraclavicular or axillary adenopathy.     Left upper body: No supraclavicular or axillary adenopathy.  Neurological:     General: No focal deficit present.     Mental Status: She is alert and oriented to person, place, and time.  Psychiatric:        Mood and Affect: Mood normal.        Behavior: Behavior normal.     LABS:      Latest Ref Rng & Units 08/12/2022    9:30 AM 07/30/2022    7:09 AM 07/26/2022    8:54 AM  CBC  WBC 4.0 - 10.5 K/uL 10.8   10.5   Hemoglobin 12.0 - 15.0 g/dL 12.0  12.9  12.5   Hematocrit 36.0 - 46.0 % 37.0  38.0  38.2   Platelets 150 - 400 K/uL 460   477       Latest Ref Rng & Units 07/30/2022    7:09 AM 07/26/2022    8:54 AM 04/26/2022    8:20 AM   CMP  Glucose 70 - 99 mg/dL 91  100  88   BUN 6 - 20 mg/dL 7  10  8    Creatinine 0.44 - 1.00 mg/dL 0.60  0.78  0.76   Sodium 135 - 145 mmol/L 141  131  137   Potassium 3.5 - 5.1 mmol/L 4.1  2.7  3.8   Chloride 98 - 111 mmol/L 110  104  100   CO2 22 - 32 mmol/L  20  21   Calcium 8.9 - 10.3 mg/dL  8.7  9.3   Total Protein 6.5 - 8.1 g/dL  7.8  6.9   Total Bilirubin 0.3 - 1.2 mg/dL  0.4  <0.2   Alkaline Phos 38 -  126 U/L  61  71   AST 15 - 41 U/L  14  11   ALT 0 - 44 U/L  13  11      No results found for: "CEA1", "CEA" / No results found for: "CEA1", "CEA" No results found for: "PSA1" No results found for: "[id number]" No results found for: "CAN125"  No results found for: "TOTALPROTELP", "ALBUMINELP", "A1GS", "A2GS", "BETS", "BETA2SER", "GAMS", "MSPIKE", "SPEI" Lab Results  Component Value Date   TIBC 319 08/12/2022   TIBC 437 06/19/2022   TIBC 365 05/06/2022   FERRITIN 52 08/12/2022   FERRITIN 10 (L) 06/19/2022   FERRITIN 18 05/06/2022   IRONPCTSAT 9 (L) 08/12/2022   IRONPCTSAT 4 (L) 06/19/2022   IRONPCTSAT 5 (LL) 05/06/2022   No results found for: "LDH"  Pathology 07/30/22: FINAL MICROSCOPIC DIAGNOSIS:  A. UTEURS, CERVIX, BILATERAL FALLOPIAN TUBES, HYSTERECTOMY AND  SALPINGECTOMY:  - Uterus with benign inactive endometrium  - Benign unremarkable cervix  - Benign bilateral fallopian tubes with benign paratubal cysts  - No evidence of malignancy   STUDIES:   No results found.

## 2022-08-19 DIAGNOSIS — F329 Major depressive disorder, single episode, unspecified: Secondary | ICD-10-CM | POA: Diagnosis not present

## 2022-08-19 DIAGNOSIS — F988 Other specified behavioral and emotional disorders with onset usually occurring in childhood and adolescence: Secondary | ICD-10-CM | POA: Diagnosis not present

## 2022-08-19 DIAGNOSIS — F439 Reaction to severe stress, unspecified: Secondary | ICD-10-CM | POA: Diagnosis not present

## 2022-08-21 DIAGNOSIS — N946 Dysmenorrhea, unspecified: Secondary | ICD-10-CM

## 2022-08-23 ENCOUNTER — Inpatient Hospital Stay: Payer: Medicaid Other

## 2022-08-23 VITALS — BP 131/91 | HR 90 | Temp 98.7°F | Resp 20

## 2022-08-23 DIAGNOSIS — D75839 Thrombocytosis, unspecified: Secondary | ICD-10-CM | POA: Diagnosis not present

## 2022-08-23 DIAGNOSIS — D500 Iron deficiency anemia secondary to blood loss (chronic): Secondary | ICD-10-CM

## 2022-08-23 MED ORDER — SODIUM CHLORIDE 0.9 % IV SOLN: INTRAVENOUS | Status: DC

## 2022-08-23 MED ORDER — SODIUM CHLORIDE 0.9 % IV SOLN
510.0000 mg | Freq: Once | INTRAVENOUS | Status: AC
  Administered 2022-08-23: 510 mg via INTRAVENOUS
  Filled 2022-08-23: qty 510

## 2022-08-23 NOTE — Patient Instructions (Signed)
[facility]  Discharge Instructions: Thank you for choosing [facility] to provide your oncology and hematology care.  If you have a lab appointment with the Cancer Center, please come in thru the Main Entrance and check in at the main information desk.  Wear comfortable clothing and clothing appropriate for easy access to any Portacath or PICC line.   We strive to give you quality time with your provider. You may need to reschedule your appointment if you arrive late (15 or more minutes).  Arriving late affects you and other patients whose appointments are after yours.  Also, if you miss three or more appointments without notifying the office, you may be dismissed from the clinic at the provider's discretion.      For prescription refill requests, have your pharmacy contact our office and allow 72 hours for refills to be completed.    Today you received the following chemotherapy and/or immunotherapy agents feraheme      To help prevent nausea and vomiting after your treatment, we encourage you to take your nausea medication as directed.  BELOW ARE SYMPTOMS THAT SHOULD BE REPORTED IMMEDIATELY: *FEVER GREATER THAN 100.4 F (38 C) OR HIGHER *CHILLS OR SWEATING *NAUSEA AND VOMITING THAT IS NOT CONTROLLED WITH YOUR NAUSEA MEDICATION *UNUSUAL SHORTNESS OF BREATH *UNUSUAL BRUISING OR BLEEDING *URINARY PROBLEMS (pain or burning when urinating, or frequent urination) *BOWEL PROBLEMS (unusual diarrhea, constipation, pain near the anus) TENDERNESS IN MOUTH AND THROAT WITH OR WITHOUT PRESENCE OF ULCERS (sore throat, sores in mouth, or a toothache) UNUSUAL RASH, SWELLING OR PAIN  UNUSUAL VAGINAL DISCHARGE OR ITCHING   Items with * indicate a potential emergency and should be followed up as soon as possible or go to the Emergency Department if any problems should occur.  Please show the CHEMOTHERAPY ALERT CARD or IMMUNOTHERAPY ALERT CARD at check-in to the  Emergency Department and triage nurse.  Should you have questions after your visit or need to cancel or reschedule your appointment, please contact [facility] [phone]  and follow the prompts.  Office hours are 8:00 a.m. to 4:30 p.m. Monday - Friday. Please note that voicemails left after 4:00 p.m. may not be returned until the following business day.  We are closed weekends and major holidays. You have access to a nurse at all times for urgent questions. Please call the main number to the clinic [phone] and follow the prompts.  For any non-urgent questions, you may also contact your provider using MyChart. We now offer e-Visits for anyone 18 and older to request care online for non-urgent symptoms. For details visit [email or web address].   Also download the MyChart app! Go to the app store, search "MyChart", open the app, select [facility], and log in with your MyChart username and password.   

## 2022-08-23 NOTE — Progress Notes (Signed)
Patient presents today for Feraheme infusion per providers order.  Vital signs WNL.  Patient has no new complaints at this time.  Peripheral IV started and blood return noted pre and post infusion.  Stable during infusion without adverse affects.  Vital signs stable.  No complaints at this time.  Discharge from clinic ambulatory in stable condition.  Alert and oriented X 3.  Follow up with [facility] as scheduled.  

## 2022-08-26 DIAGNOSIS — F909 Attention-deficit hyperactivity disorder, unspecified type: Secondary | ICD-10-CM | POA: Diagnosis not present

## 2022-08-26 DIAGNOSIS — F411 Generalized anxiety disorder: Secondary | ICD-10-CM | POA: Diagnosis not present

## 2022-08-27 ENCOUNTER — Other Ambulatory Visit: Payer: Self-pay | Admitting: Allergy & Immunology

## 2022-08-27 ENCOUNTER — Other Ambulatory Visit: Payer: Self-pay | Admitting: Family Medicine

## 2022-08-27 DIAGNOSIS — I10 Essential (primary) hypertension: Secondary | ICD-10-CM

## 2022-08-28 ENCOUNTER — Other Ambulatory Visit: Payer: Medicaid Other | Admitting: *Deleted

## 2022-08-28 NOTE — Patient Instructions (Signed)
Visit Information  [name]  - as a part of your Medicaid benefit, you are eligible for care management and care coordination services at no cost or copay. I was unable to reach you by phone today but would be happy to help you with your health related needs. Please feel free to call me @ [phone].   A member of the Managed Medicaid care management team will reach out to you again over the next 7 days.   Lurena Joiner RN, BSN [place] [facility] RN Care Coordinator [phone]

## 2022-08-28 NOTE — Patient Outreach (Signed)
  Medicaid Managed Care   Unsuccessful Attempt Note   08/28/2022 Name: Kristin Baker MRN: [id number] DOB: [birth year]  Referred by: [name], FNP Reason for referral : High Risk Managed Medicaid (Unsuccessful RNCM follow up telephone outreach)   An unsuccessful telephone outreach was attempted today. The patient was referred to the case management team for assistance with care management and care coordination.    Follow Up Plan: The Managed Medicaid care management team will reach out to the patient again over the next 7 days.    Lurena Joiner RN, BSN [place] [facility] RN Care Coordinator [phone]

## 2022-09-02 ENCOUNTER — Telehealth: Payer: Self-pay

## 2022-09-02 ENCOUNTER — Other Ambulatory Visit: Payer: Medicaid Other | Admitting: *Deleted

## 2022-09-02 NOTE — Patient Outreach (Signed)
   Embedded Care Coordination  Case Closure Note  09/02/2022 Name: Kristin Baker MRN: [id number] DOB: 1991-01-13  Kristin Baker is a 32 y.o. year old female who is a primary care patient of Gilmore Laroche, FNP. The Embedded Care Coordination team was consulted for assistance with chronic disease management and care coordination needs related to HTN and Chronic Pain  The patient declines further follow up and engagement by the care management team and the case will be closed. Appropriate care team members and provider have been notified via electronic communication. The care management team is available to provide care management/care coordination support at any time in the future should needs arise. Further engagement requires referral order ([id number]).   If patient returns call to provider office and is in need of assistance from the embedded care coordination team, please advise that the patient call the [facility] Care Guide at [phone] for assistance.   Estanislado Emms RN, BSN [place]  Managed [facility] RN Care Coordinator [phone]

## 2022-09-02 NOTE — Telephone Encounter (Signed)
..  Patient declines further follow up and engagement by the Managed Medicaid Team. Appropriate care team members and provider have been notified via electronic communication. The Managed Medicaid Team is available to follow up with the patient after provider conversation with the patient regarding recommendation for engagement and subsequent re-referral to the Managed Medicaid Team.     Tuwanna Krausz Care Guide  Medicaid Managed    [phone]  

## 2022-09-09 ENCOUNTER — Other Ambulatory Visit: Payer: Self-pay | Admitting: Nurse Practitioner

## 2022-09-09 DIAGNOSIS — E039 Hypothyroidism, unspecified: Secondary | ICD-10-CM

## 2022-09-15 ENCOUNTER — Encounter: Payer: Self-pay | Admitting: Obstetrics & Gynecology

## 2022-09-16 ENCOUNTER — Ambulatory Visit (INDEPENDENT_AMBULATORY_CARE_PROVIDER_SITE_OTHER): Payer: Medicaid Other | Admitting: Obstetrics & Gynecology

## 2022-09-16 ENCOUNTER — Encounter: Payer: Self-pay | Admitting: Obstetrics & Gynecology

## 2022-09-16 VITALS — BP 164/102 | HR 98 | Ht 66.0 in | Wt 292.0 lb

## 2022-09-16 DIAGNOSIS — Z9889 Other specified postprocedural states: Secondary | ICD-10-CM

## 2022-09-16 NOTE — Progress Notes (Signed)
HPI: Patient returns for routine postoperative follow-up having undergone Xi robotic assisted laparoscopic hysterectomy with bilateral salpingectomy on 07/30/22.  The patient's immediate postoperative recovery has been unremarkable. Since hospital discharge the patient reports no problems, some pelvic pressure.   Current Outpatient Medications: acetaminophen (TYLENOL) 500 MG tablet, Take 500-1,000 mg by mouth every 6 (six) hours as needed for moderate pain., Disp: , Rfl:  amphetamine-dextroamphetamine (ADDERALL XR) 20 MG 24 hr capsule, Take 20 mg by mouth daily., Disp: , Rfl:  amphetamine-dextroamphetamine (ADDERALL) 5 MG tablet, Take 5 mg by mouth daily in the afternoon., Disp: , Rfl:  azelastine (ASTELIN) 0.1 % nasal spray, 2 sprays per nostril 1-2 times daily as needed., Disp: 30 mL, Rfl: 5 Cholecalciferol (VITAMIN D3) 25 MCG (1000 UT) CAPS, Take 1 capsule (1,000 Units total) by mouth daily., Disp: 30 capsule, Rfl: 3 fluticasone (FLOVENT HFA) 110 MCG/ACT inhaler, Inhale 2 puffs into the lungs 2 (two) times daily. With spacer., Disp: 1 each, Rfl: 5 Galcanezumab-gnlm (EMGALITY) 120 MG/ML SOAJ, Inject 1 pen  into the skin every 30 (thirty) days., Disp: 3 mL, Rfl: 3 hydrochlorothiazide (HYDRODIURIL) 12.5 MG tablet, TAKE 1 TABLET(12.5 MG) BY MOUTH DAILY, Disp: 90 tablet, Rfl: 0 ibuprofen (ADVIL) 200 MG tablet, Take 400 mg by mouth every 6 (six) hours as needed for moderate pain., Disp: , Rfl:  levothyroxine (SYNTHROID) 112 MCG tablet, Take 1 tablet (112 mcg total) by mouth daily., Disp: 90 tablet, Rfl: 3 rizatriptan (MAXALT-MLT) 10 MG disintegrating tablet, DISSOLVE ONE TABLET BY MOUTH AS NEEDED FOR MIGRAINE. MAY REPEAT IN 2 HOURS IF NEEDED. NO MORE THAN 2 TABLETS DAILY OR 10 TABLETS A MONTH., Disp: 10 tablet, Rfl: 5 tiZANidine (ZANAFLEX) 4 MG tablet, Take 4 mg by mouth at bedtime as needed for muscle spasms., Disp: , Rfl:  triamcinolone (NASACORT) 55 MCG/ACT AERO nasal inhaler, Place 1 spray into  the nose daily., Disp: 16.9 mL, Rfl: 5 VENTOLIN HFA 108 (90 Base) MCG/ACT inhaler, INHALE 2 PUFFS INTO THE LUNGS EVERY 4 HOURS AS NEEDED FOR WHEEZING OR SHORTNESS OF BREATH OR COUGH, Disp: 18 g, Rfl: 1 ketorolac (TORADOL) 10 MG tablet, Take 1 tablet (10 mg total) by mouth every 8 (eight) hours as needed. (Patient not taking: Reported on 08/09/2022), Disp: 15 tablet, Rfl: 0 levocetirizine (XYZAL) 5 MG tablet, Take 1 tablet (5 mg total) by mouth in the morning and at bedtime. (Patient taking differently: Take 5 mg by mouth daily.), Disp: 60 tablet, Rfl: 5 ondansetron (ZOFRAN-ODT) 8 MG disintegrating tablet, Take 1 tablet (8 mg total) by mouth every 8 (eight) hours as needed for nausea or vomiting. (Patient not taking: Reported on 08/09/2022), Disp: 8 tablet, Rfl: 0 oxyCODONE-acetaminophen (PERCOCET) 5-325 MG tablet, Take 1 tablet by mouth every 4 (four) hours as needed for severe pain. (Patient not taking: Reported on 08/09/2022), Disp: 28 tablet, Rfl: 0 potassium chloride SA (KLOR-CON M) 20 MEQ tablet, Take 1 tablet (20 mEq total) by mouth 3 (three) times daily. (Patient not taking: Reported on 08/09/2022), Disp: 90 tablet, Rfl: 1  No current facility-administered medications for this visit.    Blood pressure 130/88, pulse 86, height  (1.676 m), weight 294 lb (133.4 kg), last menstrual period 03/10/2022.  Physical Exam: Abdomen is benign post op 4 incisions all look good Vaginal cuff is intact  No cuff tenderness discharge of masses at the cuff Healing well suture not palpable  Diagnostic Tests:   Pathology: benign  Impression + Management plan:   ICD-10-CM   1. Post-operative state: [employer]  robotic hysterectomy 07/30/22  Z98.890           Medications Prescribed this encounter: No orders of the defined types were placed in this encounter.     Follow up: 5 weeks   Lazaro Arms, MD Attending Physician for the Center [facility] 09/16/2022 11:31 AM

## 2022-09-23 ENCOUNTER — Other Ambulatory Visit: Payer: Self-pay | Admitting: Family Medicine

## 2022-09-23 ENCOUNTER — Ambulatory Visit (INDEPENDENT_AMBULATORY_CARE_PROVIDER_SITE_OTHER): Payer: Medicaid Other | Admitting: Family Medicine

## 2022-09-23 VITALS — BP 140/86 | HR 95 | Ht 66.0 in | Wt 288.1 lb

## 2022-09-23 DIAGNOSIS — G8929 Other chronic pain: Secondary | ICD-10-CM | POA: Diagnosis not present

## 2022-09-23 DIAGNOSIS — E7849 Other hyperlipidemia: Secondary | ICD-10-CM

## 2022-09-23 DIAGNOSIS — M545 Low back pain, unspecified: Secondary | ICD-10-CM | POA: Diagnosis not present

## 2022-09-23 DIAGNOSIS — I10 Essential (primary) hypertension: Secondary | ICD-10-CM

## 2022-09-23 DIAGNOSIS — E538 Deficiency of other specified B group vitamins: Secondary | ICD-10-CM

## 2022-09-23 DIAGNOSIS — E038 Other specified hypothyroidism: Secondary | ICD-10-CM

## 2022-09-23 DIAGNOSIS — E039 Hypothyroidism, unspecified: Secondary | ICD-10-CM | POA: Diagnosis not present

## 2022-09-23 DIAGNOSIS — R7301 Impaired fasting glucose: Secondary | ICD-10-CM

## 2022-09-23 DIAGNOSIS — E559 Vitamin D deficiency, unspecified: Secondary | ICD-10-CM | POA: Diagnosis not present

## 2022-09-23 DIAGNOSIS — J302 Other seasonal allergic rhinitis: Secondary | ICD-10-CM | POA: Diagnosis not present

## 2022-09-23 DIAGNOSIS — J3089 Other allergic rhinitis: Secondary | ICD-10-CM | POA: Diagnosis not present

## 2022-09-23 DIAGNOSIS — M5136 Other intervertebral disc degeneration, lumbar region: Secondary | ICD-10-CM

## 2022-09-23 MED ORDER — TIZANIDINE HCL 4 MG PO TABS
4.0000 mg | ORAL_TABLET | Freq: Every evening | ORAL | 0 refills | Status: DC | PRN
Start: 2022-09-23 — End: 2023-02-02

## 2022-09-23 MED ORDER — LEVOCETIRIZINE DIHYDROCHLORIDE 5 MG PO TABS
5.0000 mg | ORAL_TABLET | Freq: Every day | ORAL | 2 refills | Status: DC
Start: 2022-09-23 — End: 2023-03-18

## 2022-09-23 MED ORDER — OLMESARTAN MEDOXOMIL-HCTZ 20-12.5 MG PO TABS
1.0000 | ORAL_TABLET | Freq: Every day | ORAL | 1 refills | Status: DC
Start: 2022-09-23 — End: 2022-11-25

## 2022-09-23 NOTE — Assessment & Plan Note (Signed)
Complains of tremors in her lower extremity with prolonged standing Reports occasional numbness and tingling her toes No reports of muscle weakness or bowel bladder incontinence Will assess vitamin B12 levels today Encouraged to start taking B12 at 1000 mcg daily Will assess electrolytes levels today We will continue to monitor

## 2022-09-23 NOTE — Assessment & Plan Note (Signed)
Uncontrolled Denies headaches, dizziness, blurred vision Reports elevated BP readings recently We will start the patient on a combination medication with olmesartan hydrochlorothiazide 20-12.5mg  daily Encouraged low-sodium diet with increased physical activity We will follow-up on BP in 4 weeks BP Readings from Last 3 Encounters:  09/23/22 (!) 140/86  09/16/22 (!) 164/102  08/23/22 (!) 131/91

## 2022-09-23 NOTE — Progress Notes (Signed)
Established Patient Office Visit  Subjective:  Patient ID: Kristin Baker, female    DOB: 04/23/1991  Age: 32 y.o. MRN: [id number]  CC:  Chief Complaint  Patient presents with   Chronic Care Management    5 month f/u.    HPI Kristin Baker is a 32 y.o. female with past medical history of hypertension, anxiety, hypothyroidism, and chronic low back pain presents for f/u of  chronic medical conditions. For the details of today's visit, please refer to the assessment and plan.     Past Medical History:  Diagnosis Date   Abdominal pain    ADHD    ANA positive    Back pain    DDD (degenerative disc disease), lumbar    Eye pain, right    Fatigue    Frontal sinusitis    Hypertension    Hypothyroidism    Iron deficiency anemia due to chronic blood loss 06/19/2022   Medical history non-contributory    Obesity, morbid (HCC)    Palpitations    Rheumatoid arthritis (HCC)     Past Surgical History:  Procedure Laterality Date   ABLATION     05/22/2020 left side, 06/14/2020 right side    HYSTERECTOMY ABDOMINAL WITH SALPINGECTOMY     ROBOTIC ASSISTED TOTAL HYSTERECTOMY Bilateral 07/30/2022   Procedure: XI ROBOTIC ASSISTED TOTAL HYSTERECTOMY AND BILATERAL SALPINGECTOMY;  Surgeon: Lazaro Arms, MD;  Location: [facility];  Service: Gynecology;  Laterality: Bilateral;    Family History  Problem Relation Age of Onset   Cervical cancer Maternal Grandmother    Breast cancer Maternal Grandmother    Skin cancer Maternal Grandmother    Heart attack Maternal Grandfather    Diabetes Father    Heart attack Father    Hyperlipidemia Mother    Peripheral Artery Disease Mother    ADD / ADHD Mother    Bipolar disorder Mother    Cervical cancer Mother        at age 16.   ADD / ADHD Sister    Healthy Daughter    Breast cancer Maternal Aunt    Cervical cancer Maternal Aunt    ADD / ADHD Maternal Aunt    Uterine cancer Maternal Aunt    Breast cancer Maternal Aunt    Multiple sclerosis  Paternal Aunt    Colon cancer Neg Hx     Social History   Socioeconomic History   Marital status: Married    Spouse name: Not on file   Number of children: 1   Years of education: Not on file   Highest education level: Associate degree: academic program  Occupational History   Not on file  Tobacco Use   Smoking status: Former    Packs/day: 1.00    Years: 15.00    Additional pack years: 0.00    Total pack years: 15.00    Types: Cigarettes    Quit date: 11/24/2020    Years since quitting: 1.8   Smokeless tobacco: Never   Tobacco comments:    She has quit of and on.  Vaping Use   Vaping Use: Every day  Substance and Sexual Activity   Alcohol use: Yes    Comment: occasionally /social events   Drug use: Not Currently    Comment: smoked weed during teen years   Sexual activity: Not Currently    Birth control/protection: Surgical    Comment: hyst  Other Topics Concern   Not on file  Social History Narrative   Married for May 2021.Lives  with husband and daughter.Homemaker.Husband works at [employer].   Social Determinants of Health   Financial Resource Strain: Low Risk  (09/23/2022)   Overall Financial Resource Strain (CARDIA)    Difficulty of Paying Living Expenses: Not very hard  Food Insecurity: Food Insecurity Present (09/23/2022)   Hunger Vital Sign    Worried About Running Out of Food in the Last Year: Sometimes true    Ran Out of Food in the Last Year: Never true  Transportation Needs: No Transportation Needs (09/23/2022)   PRAPARE - Administrator, Civil Service (Medical): No    Lack of Transportation (Non-Medical): No  Physical Activity: Unknown (09/23/2022)   Exercise Vital Sign    Days of Exercise per Week: Patient declined    Minutes of Exercise per Session: Not on file  Stress: Stress Concern Present (09/23/2022)   [employer] of Occupational Health - Occupational Stress Questionnaire    Feeling of Stress : Very much  Social  Connections: Moderately Isolated (09/23/2022)   Social Connection and Isolation Panel [NHANES]    Frequency of Communication with Friends and Family: More than three times a week    Frequency of Social Gatherings with Friends and Family: Twice a week    Attends Religious Services: Never    Database administrator or Organizations: No    Attends Engineer, structural: Not on file    Marital Status: Married  Catering manager Violence: Not At Risk (06/19/2022)   Humiliation, Afraid, Rape, and Kick questionnaire    Fear of Current or Ex-Partner: No    Emotionally Abused: No    Physically Abused: No    Sexually Abused: No    Outpatient Medications Prior to Visit  Medication Sig Dispense Refill   acetaminophen (TYLENOL) 500 MG tablet Take 500-1,000 mg by mouth every 6 (six) hours as needed for moderate pain.     amphetamine-dextroamphetamine (ADDERALL XR) 20 MG 24 hr capsule Take 20 mg by mouth daily.     amphetamine-dextroamphetamine (ADDERALL) 5 MG tablet Take 5 mg by mouth daily in the afternoon.     azelastine (ASTELIN) 0.1 % nasal spray 2 sprays per nostril 1-2 times daily as needed. 30 mL 5   Cholecalciferol (VITAMIN D3) 25 MCG (1000 UT) CAPS Take 1 capsule (1,000 Units total) by mouth daily. 30 capsule 3   fluticasone (FLOVENT HFA) 110 MCG/ACT inhaler Inhale 2 puffs into the lungs 2 (two) times daily. With spacer. 1 each 5   Galcanezumab-gnlm (EMGALITY) 120 MG/ML SOAJ Inject 1 pen  into the skin every 30 (thirty) days. 3 mL 3   ibuprofen (ADVIL) 200 MG tablet Take 400 mg by mouth every 6 (six) hours as needed for moderate pain.     levothyroxine (SYNTHROID) 112 MCG tablet TAKE 1 TABLET(112 MCG) BY MOUTH DAILY 90 tablet 3   potassium chloride SA (KLOR-CON M) 20 MEQ tablet Take 1 tablet (20 mEq total) by mouth 3 (three) times daily. 90 tablet 1   rizatriptan (MAXALT-MLT) 10 MG disintegrating tablet DISSOLVE ONE TABLET BY MOUTH AS NEEDED FOR MIGRAINE. MAY REPEAT IN 2 HOURS IF NEEDED.  NO MORE THAN 2 TABLETS DAILY OR 10 TABLETS A MONTH. 10 tablet 5   triamcinolone (NASACORT) 55 MCG/ACT AERO nasal inhaler Place 1 spray into the nose daily. 16.9 mL 5   VENTOLIN HFA 108 (90 Base) MCG/ACT inhaler INHALE 2 PUFFS INTO THE LUNGS EVERY 4 HOURS AS NEEDED FOR WHEEZING OR SHORTNESS OF BREATH OR COUGH 18 g  1   hydrochlorothiazide (HYDRODIURIL) 12.5 MG tablet TAKE 1 TABLET(12.5 MG) BY MOUTH DAILY 90 tablet 0   tiZANidine (ZANAFLEX) 4 MG tablet Take 4 mg by mouth at bedtime as needed for muscle spasms.     levocetirizine (XYZAL) 5 MG tablet Take 1 tablet (5 mg total) by mouth in the morning and at bedtime. (Patient taking differently: Take 5 mg by mouth daily.) 60 tablet 5   No facility-administered medications prior to visit.    Allergies  Allergen Reactions   Poison Ivy Extract Anaphylaxis   Poison Oak Extract Anaphylaxis   Anser Anser Feather (Goose) Allergy Skin Test    Flexeril [Cyclobenzaprine] Hives   Lactose Intolerance (Gi) Diarrhea   Honeysuckle Flower [Lonicera] Rash   Mixed Grasses Rash    ROS Review of Systems  Constitutional:  Negative for fatigue and fever.  Respiratory:  Negative for chest tightness and shortness of breath.   Cardiovascular:  Negative for chest pain and palpitations.  Gastrointestinal:  Negative for nausea and vomiting.  Musculoskeletal:  Negative for gait problem.  Neurological:  Negative for dizziness and headaches.      Objective:    Physical Exam HENT:     Head: Normocephalic.     Mouth/Throat:     Mouth: Mucous membranes are moist.  Cardiovascular:     Rate and Rhythm: Normal rate.     Heart sounds: Normal heart sounds.  Pulmonary:     Effort: Pulmonary effort is normal.     Breath sounds: Normal breath sounds.  Neurological:     Mental Status: She is alert.     BP (!) 140/86 (BP Location: Left Arm)   Pulse 95   Ht 5\' 6"  (1.676 m)   Wt 288 lb 1.9 oz (130.7 kg)   LMP 03/10/2022 (Approximate)   SpO2 98%   BMI 46.50 kg/m   Wt Readings from Last 3 Encounters:  09/23/22 288 lb 1.9 oz (130.7 kg)  09/16/22 292 lb (132.5 kg)  08/15/22 292 lb 12.8 oz (132.8 kg)    Lab Results  Component Value Date   TSH 2.530 04/26/2022   Lab Results  Component Value Date   WBC 10.8 (H) 08/12/2022   HGB 12.0 08/12/2022   HCT 37.0 08/12/2022   MCV 79.6 (L) 08/12/2022   PLT 460 (H) 08/12/2022   Lab Results  Component Value Date   NA 141 07/30/2022   K 4.1 07/30/2022   CO2 20 (L) 07/26/2022   GLUCOSE 91 07/30/2022   BUN 7 07/30/2022   CREATININE 0.60 07/30/2022   BILITOT 0.4 07/26/2022   ALKPHOS 61 07/26/2022   AST 14 (L) 07/26/2022   ALT 13 07/26/2022   PROT 7.8 07/26/2022   ALBUMIN 3.8 07/26/2022   CALCIUM 8.7 (L) 07/26/2022   ANIONGAP 7 07/26/2022   EGFR 107 04/26/2022   Lab Results  Component Value Date   CHOL 198 04/26/2022   Lab Results  Component Value Date   HDL 38 (L) 04/26/2022   Lab Results  Component Value Date   LDLCALC 137 (H) 04/26/2022   Lab Results  Component Value Date   TRIG 125 04/26/2022   Lab Results  Component Value Date   CHOLHDL 5.2 (H) 04/26/2022   Lab Results  Component Value Date   HGBA1C 5.7 (H) 04/26/2022      Assessment & Plan:  Primary hypertension Assessment & Plan: Uncontrolled Denies headaches, dizziness, blurred vision Reports elevated BP readings recently We will start the patient on a combination medication  with olmesartan hydrochlorothiazide 20-12.5mg  daily Encouraged low-sodium diet with increased physical activity We will follow-up on BP in 4 weeks BP Readings from Last 3 Encounters:  09/23/22 (!) 140/86  09/16/22 (!) 164/102  08/23/22 (!) 131/91      Essential hypertension -     Olmesartan Medoxomil-HCTZ; Take 1 tablet by mouth daily.  Dispense: 30 tablet; Refill: 1 -     [id number]  DDD (degenerative disc disease), lumbar Assessment & Plan: Complains of tremors in her lower extremity with prolonged standing Reports occasional  numbness and tingling her toes No reports of muscle weakness or bowel bladder incontinence Will assess vitamin B12 levels today Encouraged to start taking B12 at 1000 mcg daily Will assess electrolytes levels today We will continue to monitor   Acquired hypothyroidism Assessment & Plan: Compliant with Synthroid 112 mcg Concerns that she might have Hashimoto's disease given her family history of autoimmune disease She reports family history of lupus  Will assess thyroid levels today Lab Results  Component Value Date   TSH 2.530 04/26/2022       Other specified hypothyroidism -     TSH + free T4  IFG (impaired fasting glucose) -     Hemoglobin A1c  Vitamin D deficiency -     VITAMIN D 25 Hydroxy (Vit-D Deficiency, Fractures)  Other hyperlipidemia -     Lipid panel  Seasonal and perennial allergic rhinitis -     Levocetirizine Dihydrochloride; Take 1 tablet (5 mg total) by mouth daily.  Dispense: 30 tablet; Refill: 2  Vitamin B12 deficiency -     Vitamin B12  Chronic bilateral low back pain without sciatica -     tiZANidine HCl; Take 1 tablet (4 mg total) by mouth at bedtime as needed for muscle spasms.  Dispense: 30 tablet; Refill: 0    Follow-up: Return in about 1 month (around 10/23/2022) for BP.   Gilmore Laroche, FNP

## 2022-09-23 NOTE — Assessment & Plan Note (Signed)
Compliant with Synthroid 112 mcg Concerns that she might have Hashimoto's disease given her family history of autoimmune disease She reports family history of lupus  Will assess thyroid levels today Lab Results  Component Value Date   TSH 2.530 04/26/2022

## 2022-09-23 NOTE — Patient Instructions (Addendum)
I appreciate the opportunity to provide care to you today!    Follow up:  1 month for BP  Labs: please stop by the lab today to get your blood drawn (TSH, Lipid profile, HgA1c, Vit D)  Please start taking Olmesartan- hydrochlorothiazide 20-12.5 mg  I recommend low-sodium diet with increased physical activity  I recommend to start taking vitamin B12 1000 mcg daily  Please pick up your refills at the pharmacy  Please continue to a heart-healthy diet and increase your physical activities. Try to exercise for at least five days a week.      It was a pleasure to see you and I look forward to continuing to work together on your health and well-being. Please do not hesitate to call the office if you need care or have questions about your care.   Have a wonderful day and week. With Gratitude, Gilmore Laroche MSN, FNP-BC

## 2022-09-24 ENCOUNTER — Other Ambulatory Visit: Payer: Self-pay | Admitting: Family Medicine

## 2022-09-24 DIAGNOSIS — E038 Other specified hypothyroidism: Secondary | ICD-10-CM

## 2022-09-24 LAB — TSH+FREE T4
Free T4: 0.7 ng/dL — ABNORMAL LOW (ref 0.82–1.77)
TSH: 8.36 u[IU]/mL — ABNORMAL HIGH (ref 0.450–4.500)

## 2022-09-24 LAB — CMP14+EGFR
ALT: 20 IU/L (ref 0–32)
AST: 17 IU/L (ref 0–40)
Albumin/Globulin Ratio: 1.4 (ref 1.2–2.2)
Albumin: 4.2 g/dL (ref 3.9–4.9)
Alkaline Phosphatase: 69 IU/L (ref 44–121)
BUN/Creatinine Ratio: 4 — ABNORMAL LOW (ref 9–23)
BUN: 3 mg/dL — ABNORMAL LOW (ref 6–20)
Bilirubin Total: <0.2 mg/dL (ref 0.0–1.2)
CO2: 21 mmol/L (ref 20–29)
Calcium: 9.5 mg/dL (ref 8.7–10.2)
Chloride: 103 mmol/L (ref 96–106)
Creatinine, Ser: 0.74 mg/dL (ref 0.57–1.00)
Globulin, Total: 3 g/dL (ref 1.5–4.5)
Glucose: 90 mg/dL (ref 70–99)
Potassium: 3.9 mmol/L (ref 3.5–5.2)
Sodium: 140 mmol/L (ref 134–144)
Total Protein: 7.2 g/dL (ref 6.0–8.5)
eGFR: 110 mL/min/{1.73_m2} (ref >59)

## 2022-09-24 LAB — CBC WITH DIFFERENTIAL/PLATELET
Basophils Absolute: 0.1 10*3/uL (ref 0.0–0.2)
Basos: 1 %
EOS (ABSOLUTE): 0.2 10*3/uL (ref 0.0–0.4)
Eos: 3 %
Hematocrit: 42.6 % (ref 34.0–46.6)
Hemoglobin: 14 g/dL (ref 11.1–15.9)
Immature Grans (Abs): 0 10*3/uL (ref 0.0–0.1)
Immature Granulocytes: 0 %
Lymphocytes Absolute: 2.4 10*3/uL (ref 0.7–3.1)
Lymphs: 36 %
MCH: 27.8 pg (ref 26.6–33.0)
MCHC: 32.9 g/dL (ref 31.5–35.7)
MCV: 85 fL (ref 79–97)
Monocytes Absolute: 0.4 10*3/uL (ref 0.1–0.9)
Monocytes: 6 %
Neutrophils Absolute: 3.5 10*3/uL (ref 1.4–7.0)
Neutrophils: 54 %
Platelets: 422 10*3/uL (ref 150–450)
RBC: 5.03 x10E6/uL (ref 3.77–5.28)
RDW: 18.2 % — ABNORMAL HIGH (ref 11.7–15.4)
WBC: 6.5 10*3/uL (ref 3.4–10.8)

## 2022-09-24 LAB — HEMOGLOBIN A1C
Est. average glucose Bld gHb Est-mCnc: 105 mg/dL
Hgb A1c MFr Bld: 5.3 % (ref 4.8–5.6)

## 2022-09-24 LAB — LIPID PANEL
Chol/HDL Ratio: 4.9 ratio — ABNORMAL HIGH (ref 0.0–4.4)
Cholesterol, Total: 187 mg/dL (ref 100–199)
HDL: 38 mg/dL — ABNORMAL LOW (ref >39)
LDL Chol Calc (NIH): 133 mg/dL — ABNORMAL HIGH (ref 0–99)
Triglycerides: 87 mg/dL (ref 0–149)
VLDL Cholesterol Cal: 16 mg/dL (ref 5–40)

## 2022-09-24 MED ORDER — LEVOTHYROXINE SODIUM 125 MCG PO TABS
125.0000 ug | ORAL_TABLET | Freq: Every day | ORAL | 3 refills | Status: DC
Start: 2022-09-24 — End: 2023-01-25

## 2022-09-24 NOTE — Progress Notes (Signed)
Please inform the patient that I have increased her Synthroid to 125 mcg daily.  Her thyroid levels were low.  Please encourage the patient to follow-up for labs in 6 weeks on November 04, 2022 to assess her thyroid levels.  Her LDL cholesterol has decreased from 137 to 133.  I want her LDL less than 100. I recommend avoiding simple carbohydrates, including cakes, sweet desserts, ice cream, soda (diet or regular), sweet tea, candies, chips, cookies, store-bought juices, alcohol in excess of 1-2 drinks a day, lemonade, artificial sweeteners, donuts, coffee creamers, and sugar-free products.  I recommend avoiding greasy, fatty foods with increased physical activity.

## 2022-09-26 DIAGNOSIS — F988 Other specified behavioral and emotional disorders with onset usually occurring in childhood and adolescence: Secondary | ICD-10-CM | POA: Diagnosis not present

## 2022-09-26 DIAGNOSIS — F329 Major depressive disorder, single episode, unspecified: Secondary | ICD-10-CM | POA: Diagnosis not present

## 2022-09-26 DIAGNOSIS — F439 Reaction to severe stress, unspecified: Secondary | ICD-10-CM | POA: Diagnosis not present

## 2022-10-09 ENCOUNTER — Telehealth: Payer: Self-pay | Admitting: *Deleted

## 2022-10-09 NOTE — Telephone Encounter (Signed)
"  Request Reference Number: [id number]. EMGALITY INJ 120MG /ML is approved through 10/09/2023. For further questions, call [employer] at [phone].Marland Kitchen Authorization Expiration Date: Oct 09, 2023."

## 2022-10-09 NOTE — Telephone Encounter (Signed)
Submitted PA Emgality on covermymeds. Key: [id number]. Waiting on determination from [facility].

## 2022-10-18 ENCOUNTER — Encounter: Payer: Self-pay | Admitting: Obstetrics & Gynecology

## 2022-10-18 ENCOUNTER — Ambulatory Visit (INDEPENDENT_AMBULATORY_CARE_PROVIDER_SITE_OTHER): Payer: Medicaid Other | Admitting: Obstetrics & Gynecology

## 2022-10-18 VITALS — BP 135/89 | HR 86 | Ht 66.0 in | Wt 293.0 lb

## 2022-10-18 DIAGNOSIS — Z9889 Other specified postprocedural states: Secondary | ICD-10-CM

## 2022-10-18 NOTE — Progress Notes (Signed)
  HPI: Patient returns for routine postoperative follow-up having undergone robot TLH BS on 07/30/22.  The patient's immediate postoperative recovery has been unremarkable. Since hospital discharge the patient reports no problems.   Current Outpatient Medications: acetaminophen (TYLENOL) 500 MG tablet, Take 500-1,000 mg by mouth every 6 (six) hours as needed for moderate pain., Disp: , Rfl:  amphetamine-dextroamphetamine (ADDERALL XR) 20 MG 24 hr capsule, Take 20 mg by mouth daily., Disp: , Rfl:  amphetamine-dextroamphetamine (ADDERALL) 5 MG tablet, Take 5 mg by mouth daily in the afternoon., Disp: , Rfl:  azelastine (ASTELIN) 0.1 % nasal spray, 2 sprays per nostril 1-2 times daily as needed., Disp: 30 mL, Rfl: 5 Cholecalciferol (VITAMIN D3) 25 MCG (1000 UT) CAPS, Take 1 capsule (1,000 Units total) by mouth daily., Disp: 30 capsule, Rfl: 3 fluticasone (FLOVENT HFA) 110 MCG/ACT inhaler, Inhale 2 puffs into the lungs 2 (two) times daily. With spacer., Disp: 1 each, Rfl: 5 Galcanezumab-gnlm (EMGALITY) 120 MG/ML SOAJ, Inject 1 pen  into the skin every 30 (thirty) days., Disp: 3 mL, Rfl: 3 ibuprofen (ADVIL) 200 MG tablet, Take 400 mg by mouth every 6 (six) hours as needed for moderate pain., Disp: , Rfl:  levocetirizine (XYZAL) 5 MG tablet, Take 1 tablet (5 mg total) by mouth daily., Disp: 30 tablet, Rfl: 2 levothyroxine (SYNTHROID) 125 MCG tablet, Take 1 tablet (125 mcg total) by mouth daily., Disp: 90 tablet, Rfl: 3 olmesartan-hydrochlorothiazide (BENICAR HCT) 20-12.5 MG tablet, Take 1 tablet by mouth daily., Disp: 30 tablet, Rfl: 1 potassium chloride SA (KLOR-CON M) 20 MEQ tablet, Take 1 tablet (20 mEq total) by mouth 3 (three) times daily., Disp: 90 tablet, Rfl: 1 rizatriptan (MAXALT-MLT) 10 MG disintegrating tablet, DISSOLVE ONE TABLET BY MOUTH AS NEEDED FOR MIGRAINE. MAY REPEAT IN 2 HOURS IF NEEDED. NO MORE THAN 2 TABLETS DAILY OR 10 TABLETS A MONTH., Disp: 10 tablet, Rfl: 5 tiZANidine (ZANAFLEX)  4 MG tablet, Take 1 tablet (4 mg total) by mouth at bedtime as needed for muscle spasms., Disp: 30 tablet, Rfl: 0 triamcinolone (NASACORT) 55 MCG/ACT AERO nasal inhaler, Place 1 spray into the nose daily., Disp: 16.9 mL, Rfl: 5 VENTOLIN HFA 108 (90 Base) MCG/ACT inhaler, INHALE 2 PUFFS INTO THE LUNGS EVERY 4 HOURS AS NEEDED FOR WHEEZING OR SHORTNESS OF BREATH OR COUGH, Disp: 18 g, Rfl: 1  No current facility-administered medications for this visit.    Blood pressure 135/89, pulse 86, height 5\' 6"  (1.676 m), weight 293 lb (132.9 kg), last menstrual period 03/10/2022.  Physical Exam: 4 well healed incisions Cuff intact suture is felt Cuff is non tender no masses  Sex ok 2 weeks, take it easy  Diagnostic Tests:   Pathology: benign  Impression + Management plan:   ICD-10-CM   1. Post-operative state: Xi robotic hysterectomy 07/30/22  Z98.890          Medications Prescribed this encounter: No orders of the defined types were placed in this encounter.     Follow up: prn   Lazaro Arms, MD Attending Physician for the Center for [facility] 10/18/2022 11:50 AM

## 2022-10-22 DIAGNOSIS — F909 Attention-deficit hyperactivity disorder, unspecified type: Secondary | ICD-10-CM | POA: Diagnosis not present

## 2022-10-22 DIAGNOSIS — F411 Generalized anxiety disorder: Secondary | ICD-10-CM | POA: Diagnosis not present

## 2022-10-23 ENCOUNTER — Ambulatory Visit (INDEPENDENT_AMBULATORY_CARE_PROVIDER_SITE_OTHER): Payer: Medicaid Other | Admitting: Family Medicine

## 2022-10-23 ENCOUNTER — Encounter: Payer: Self-pay | Admitting: Family Medicine

## 2022-10-23 VITALS — BP 126/83 | HR 110 | Ht 66.0 in | Wt 287.1 lb

## 2022-10-23 DIAGNOSIS — I10 Essential (primary) hypertension: Secondary | ICD-10-CM

## 2022-10-23 DIAGNOSIS — R600 Localized edema: Secondary | ICD-10-CM

## 2022-10-23 DIAGNOSIS — J4520 Mild intermittent asthma, uncomplicated: Secondary | ICD-10-CM

## 2022-10-23 MED ORDER — ALBUTEROL SULFATE HFA 108 (90 BASE) MCG/ACT IN AERS
2.0000 | INHALATION_SPRAY | Freq: Four times a day (QID) | RESPIRATORY_TRACT | 1 refills | Status: AC | PRN
Start: 2022-10-23 — End: ?

## 2022-10-23 NOTE — Assessment & Plan Note (Signed)
Controlled Denies headaches, dizziness, blurred vision We will start the patient on a combination medication with olmesartan hydrochlorothiazide 20-12.5mg  daily Encouraged low-sodium diet with increased physical activity We will follow-up on BP in 4 weeks BP Readings from Last 3 Encounters:  10/23/22 126/83  10/18/22 135/89  09/23/22 (!) 140/86

## 2022-10-23 NOTE — Progress Notes (Signed)
Established Patient Office Visit  Subjective:  Patient ID: Kristin Baker, female    DOB: 04/03/1991  Age: 32 y.o. MRN: [id number]  CC:  Chief Complaint  Patient presents with   Hypertension    1 month f/u for blood pressure   Leg Swelling    Pt reports left leg swelling with the warm weather. Drinks a lot of water not sure what's causing this. Pt reports leg pain also.    Chronic Care Management    Pt would like screening for crushing syndrome.     HPI Kristin Baker is a 32 y.o. female presents for hypertension f/u. For the details of today's visit, please refer to the assessment and plan.    Past Medical History:  Diagnosis Date   Abdominal pain    ADHD    ANA positive    Back pain    DDD (degenerative disc disease), lumbar    Eye pain, right    Fatigue    Frontal sinusitis    Hypertension    Hypothyroidism    Iron deficiency anemia due to chronic blood loss 06/19/2022   Medical history non-contributory    Obesity, morbid (HCC)    Palpitations    Rheumatoid arthritis (HCC)     Past Surgical History:  Procedure Laterality Date   ABLATION     05/22/2020 left side, 06/14/2020 right side    HYSTERECTOMY ABDOMINAL WITH SALPINGECTOMY     ROBOTIC ASSISTED TOTAL HYSTERECTOMY Bilateral 07/30/2022   Procedure: XI ROBOTIC ASSISTED TOTAL HYSTERECTOMY AND BILATERAL SALPINGECTOMY;  Surgeon: Lazaro Arms, MD;  Location: [facility];  Service: Gynecology;  Laterality: Bilateral;    Family History  Problem Relation Age of Onset   Cervical cancer Maternal Grandmother    Breast cancer Maternal Grandmother    Skin cancer Maternal Grandmother    Heart attack Maternal Grandfather    Diabetes Father    Heart attack Father    Hyperlipidemia Mother    Peripheral Artery Disease Mother    ADD / ADHD Mother    Bipolar disorder Mother    Cervical cancer Mother        at age 48.   ADD / ADHD Sister    Healthy Daughter    Breast cancer Maternal Aunt    Cervical cancer Maternal  Aunt    ADD / ADHD Maternal Aunt    Uterine cancer Maternal Aunt    Breast cancer Maternal Aunt    Multiple sclerosis Paternal Aunt    Colon cancer Neg Hx     Social History   Socioeconomic History   Marital status: Married    Spouse name: Not on file   Number of children: 1   Years of education: Not on file   Highest education level: Associate degree: academic program  Occupational History   Not on file  Tobacco Use   Smoking status: Former    Packs/day: 1.00    Years: 15.00    Additional pack years: 0.00    Total pack years: 15.00    Types: Cigarettes    Quit date: 11/24/2020    Years since quitting: 1.9   Smokeless tobacco: Never   Tobacco comments:    She has quit of and on.  Vaping Use   Vaping Use: Every day  Substance and Sexual Activity   Alcohol use: Yes    Comment: occasionally /social events   Drug use: Not Currently    Comment: smoked weed during teen years   Sexual activity: Not  Currently    Birth control/protection: Surgical    Comment: hyst  Other Topics Concern   Not on file  Social History Narrative   Married for May 2021.Lives with husband and daughter.Homemaker.Husband works at [employer].   Social Determinants of Health   Financial Resource Strain: Low Risk  (09/23/2022)   Overall Financial Resource Strain (CARDIA)    Difficulty of Paying Living Expenses: Not very hard  Food Insecurity: Food Insecurity Present (09/23/2022)   Hunger Vital Sign    Worried About Running Out of Food in the Last Year: Sometimes true    Ran Out of Food in the Last Year: Never true  Transportation Needs: No Transportation Needs (09/23/2022)   PRAPARE - Administrator, Civil Service (Medical): No    Lack of Transportation (Non-Medical): No  Physical Activity: Unknown (09/23/2022)   Exercise Vital Sign    Days of Exercise per Week: Patient declined    Minutes of Exercise per Session: Not on file  Stress: Stress Concern Present (09/23/2022)    [employer] of Occupational Health - Occupational Stress Questionnaire    Feeling of Stress : Very much  Social Connections: Moderately Isolated (09/23/2022)   Social Connection and Isolation Panel [NHANES]    Frequency of Communication with Friends and Family: More than three times a week    Frequency of Social Gatherings with Friends and Family: Twice a week    Attends Religious Services: Never    Database administrator or Organizations: No    Attends Engineer, structural: Not on file    Marital Status: Married  Catering manager Violence: Not At Risk (06/19/2022)   Humiliation, Afraid, Rape, and Kick questionnaire    Fear of Current or Ex-Partner: No    Emotionally Abused: No    Physically Abused: No    Sexually Abused: No    Outpatient Medications Prior to Visit  Medication Sig Dispense Refill   acetaminophen (TYLENOL) 500 MG tablet Take 500-1,000 mg by mouth every 6 (six) hours as needed for moderate pain.     amphetamine-dextroamphetamine (ADDERALL XR) 20 MG 24 hr capsule Take 20 mg by mouth daily.     amphetamine-dextroamphetamine (ADDERALL) 5 MG tablet Take 5 mg by mouth daily in the afternoon.     azelastine (ASTELIN) 0.1 % nasal spray 2 sprays per nostril 1-2 times daily as needed. 30 mL 5   Cholecalciferol (VITAMIN D3) 25 MCG (1000 UT) CAPS Take 1 capsule (1,000 Units total) by mouth daily. 30 capsule 3   FLUoxetine (PROZAC) 10 MG capsule Take 10 mg by mouth daily.     fluticasone (FLOVENT HFA) 110 MCG/ACT inhaler Inhale 2 puffs into the lungs 2 (two) times daily. With spacer. 1 each 5   Galcanezumab-gnlm (EMGALITY) 120 MG/ML SOAJ Inject 1 pen  into the skin every 30 (thirty) days. 3 mL 3   ibuprofen (ADVIL) 200 MG tablet Take 400 mg by mouth every 6 (six) hours as needed for moderate pain.     levocetirizine (XYZAL) 5 MG tablet Take 1 tablet (5 mg total) by mouth daily. 30 tablet 2   levothyroxine (SYNTHROID) 125 MCG tablet Take 1 tablet (125 mcg total) by mouth  daily. 90 tablet 3   olmesartan-hydrochlorothiazide (BENICAR HCT) 20-12.5 MG tablet Take 1 tablet by mouth daily. 30 tablet 1   potassium chloride SA (KLOR-CON M) 20 MEQ tablet Take 1 tablet (20 mEq total) by mouth 3 (three) times daily. 90 tablet 1   rizatriptan (  MAXALT-MLT) 10 MG disintegrating tablet DISSOLVE ONE TABLET BY MOUTH AS NEEDED FOR MIGRAINE. MAY REPEAT IN 2 HOURS IF NEEDED. NO MORE THAN 2 TABLETS DAILY OR 10 TABLETS A MONTH. 10 tablet 5   tiZANidine (ZANAFLEX) 4 MG tablet Take 1 tablet (4 mg total) by mouth at bedtime as needed for muscle spasms. 30 tablet 0   triamcinolone (NASACORT) 55 MCG/ACT AERO nasal inhaler Place 1 spray into the nose daily. 16.9 mL 5   VENTOLIN HFA 108 (90 Base) MCG/ACT inhaler INHALE 2 PUFFS INTO THE LUNGS EVERY 4 HOURS AS NEEDED FOR WHEEZING OR SHORTNESS OF BREATH OR COUGH 18 g 1   No facility-administered medications prior to visit.    Allergies  Allergen Reactions   Poison Ivy Extract Anaphylaxis   Poison Oak Extract Anaphylaxis   Anser Anser Feather (Goose) Allergy Skin Test    Flexeril [Cyclobenzaprine] Hives   Lactose Intolerance (Gi) Diarrhea   Honeysuckle Flower [Lonicera] Rash   Mixed Grasses Rash    ROS Review of Systems  Constitutional:  Negative for chills and fever.  Eyes:  Negative for visual disturbance.  Respiratory:  Negative for chest tightness and shortness of breath.   Neurological:  Negative for dizziness and headaches.      Objective:    Physical Exam HENT:     Head: Normocephalic.     Mouth/Throat:     Mouth: Mucous membranes are moist.  Cardiovascular:     Rate and Rhythm: Normal rate.     Heart sounds: Normal heart sounds.  Pulmonary:     Effort: Pulmonary effort is normal.     Breath sounds: Normal breath sounds.  Musculoskeletal:     Left lower leg: Edema (mild) present.  Neurological:     Mental Status: She is alert.     BP 126/83   Pulse (!) 110   Ht 5\' 6"  (1.676 m)   Wt 287 lb 1.9 oz (130.2 kg)    LMP 03/10/2022 (Approximate)   SpO2 97%   BMI 46.34 kg/m  Wt Readings from Last 3 Encounters:  10/23/22 287 lb 1.9 oz (130.2 kg)  10/18/22 293 lb (132.9 kg)  09/23/22 288 lb 1.9 oz (130.7 kg)    Lab Results  Component Value Date   TSH 8.360 (H) 09/23/2022   Lab Results  Component Value Date   WBC 6.5 09/23/2022   HGB 14.0 09/23/2022   HCT 42.6 09/23/2022   MCV 85 09/23/2022   PLT 422 09/23/2022   Lab Results  Component Value Date   NA 140 09/23/2022   K 3.9 09/23/2022   CO2 21 09/23/2022   GLUCOSE 90 09/23/2022   BUN 3 (L) 09/23/2022   CREATININE 0.74 09/23/2022   BILITOT <0.2 09/23/2022   ALKPHOS 69 09/23/2022   AST 17 09/23/2022   ALT 20 09/23/2022   PROT 7.2 09/23/2022   ALBUMIN 4.2 09/23/2022   CALCIUM 9.5 09/23/2022   ANIONGAP 7 07/26/2022   EGFR 110 09/23/2022   Lab Results  Component Value Date   CHOL 187 09/23/2022   Lab Results  Component Value Date   HDL 38 (L) 09/23/2022   Lab Results  Component Value Date   LDLCALC 133 (H) 09/23/2022   Lab Results  Component Value Date   TRIG 87 09/23/2022   Lab Results  Component Value Date   CHOLHDL 4.9 (H) 09/23/2022   Lab Results  Component Value Date   HGBA1C 5.3 09/23/2022      Assessment & Plan:  Primary  hypertension Assessment & Plan: Controlled Denies headaches, dizziness, blurred vision We will start the patient on a combination medication with olmesartan hydrochlorothiazide 20-12.5mg  daily Encouraged low-sodium diet with increased physical activity We will follow-up on BP in 4 weeks BP Readings from Last 3 Encounters:  10/23/22 126/83  10/18/22 135/89  09/23/22 (!) 140/86     Orders: -     BMP8+EGFR  Mild peripheral edema Assessment & Plan: Mild left lower extremity edema Pulses and sensation are intact Neuro vascular abnormalities reported or noted She notes increased swelling in her lower extremities after prolonged standing Will treat conservatively today with  compression stockings and leg elevation Encouraged low-sodium diet and refraining from prolonged sitting or standing No cardiovascular symptoms reported Lungs are clear bilaterally   Mild intermittent asthma without complication -     Albuterol Sulfate HFA; Inhale 2 puffs into the lungs every 6 (six) hours as needed for wheezing or shortness of breath.  Dispense: 18 g; Refill: 1    Follow-up: Return in about 3 months (around 01/23/2023).   Gilmore Laroche, FNP

## 2022-10-23 NOTE — Assessment & Plan Note (Signed)
Mild left lower extremity edema Pulses and sensation are intact Neuro vascular abnormalities reported or noted She notes increased swelling in her lower extremities after prolonged standing Will treat conservatively today with compression stockings and leg elevation Encouraged low-sodium diet and refraining from prolonged sitting or standing No cardiovascular symptoms reported Lungs are clear bilaterally

## 2022-10-23 NOTE — Patient Instructions (Addendum)
I appreciate the opportunity to provide care to you today!    Follow up:  3 months  Labs: please stop by the lab today to get your blood drawn (BMP)  daily elevation of 20-30 minutes above level of heart, daily compression stocking use 20-30 mmHg, exercise, weight reduction, refraining from prolonged sitting or standing.     Please continue to a heart-healthy diet and increase your physical activities. Try to exercise for at least five days a week.      It was a pleasure to see you and I look forward to continuing to work together on your health and well-being. Please do not hesitate to call the office if you need care or have questions about your care.   Have a wonderful day and week. With Gratitude, Gilmore Laroche MSN, FNP-BC

## 2022-10-24 LAB — BMP8+EGFR
BUN/Creatinine Ratio: 8 — ABNORMAL LOW (ref 9–23)
BUN: 7 mg/dL (ref 6–20)
CO2: 20 mmol/L (ref 20–29)
Calcium: 9.6 mg/dL (ref 8.7–10.2)
Chloride: 101 mmol/L (ref 96–106)
Creatinine, Ser: 0.84 mg/dL (ref 0.57–1.00)
Glucose: 93 mg/dL (ref 70–99)
Potassium: 4.2 mmol/L (ref 3.5–5.2)
Sodium: 138 mmol/L (ref 134–144)
eGFR: 95 mL/min/{1.73_m2} (ref >59)

## 2022-11-11 DIAGNOSIS — F439 Reaction to severe stress, unspecified: Secondary | ICD-10-CM | POA: Diagnosis not present

## 2022-11-11 DIAGNOSIS — F988 Other specified behavioral and emotional disorders with onset usually occurring in childhood and adolescence: Secondary | ICD-10-CM | POA: Diagnosis not present

## 2022-11-11 DIAGNOSIS — F329 Major depressive disorder, single episode, unspecified: Secondary | ICD-10-CM | POA: Diagnosis not present

## 2022-11-24 ENCOUNTER — Other Ambulatory Visit: Payer: Self-pay | Admitting: Family Medicine

## 2022-11-24 DIAGNOSIS — I10 Essential (primary) hypertension: Secondary | ICD-10-CM

## 2022-11-25 ENCOUNTER — Other Ambulatory Visit: Payer: Self-pay | Admitting: Family Medicine

## 2022-11-25 DIAGNOSIS — I10 Essential (primary) hypertension: Secondary | ICD-10-CM

## 2022-11-26 DIAGNOSIS — F909 Attention-deficit hyperactivity disorder, unspecified type: Secondary | ICD-10-CM | POA: Diagnosis not present

## 2022-11-26 DIAGNOSIS — F411 Generalized anxiety disorder: Secondary | ICD-10-CM | POA: Diagnosis not present

## 2022-12-02 DIAGNOSIS — F439 Reaction to severe stress, unspecified: Secondary | ICD-10-CM | POA: Diagnosis not present

## 2022-12-02 DIAGNOSIS — F988 Other specified behavioral and emotional disorders with onset usually occurring in childhood and adolescence: Secondary | ICD-10-CM | POA: Diagnosis not present

## 2022-12-02 DIAGNOSIS — F329 Major depressive disorder, single episode, unspecified: Secondary | ICD-10-CM | POA: Diagnosis not present

## 2022-12-03 DIAGNOSIS — H40013 Open angle with borderline findings, low risk, bilateral: Secondary | ICD-10-CM | POA: Diagnosis not present

## 2022-12-06 ENCOUNTER — Inpatient Hospital Stay: Payer: Medicaid Other | Attending: Hematology

## 2022-12-06 DIAGNOSIS — D75839 Thrombocytosis, unspecified: Secondary | ICD-10-CM | POA: Diagnosis present

## 2022-12-06 DIAGNOSIS — D509 Iron deficiency anemia, unspecified: Secondary | ICD-10-CM | POA: Diagnosis not present

## 2022-12-06 DIAGNOSIS — D500 Iron deficiency anemia secondary to blood loss (chronic): Secondary | ICD-10-CM

## 2022-12-06 LAB — IRON AND TIBC
Iron: 43 ug/dL (ref 28–170)
Saturation Ratios: 14 % (ref 10.4–31.8)
TIBC: 303 ug/dL (ref 250–450)
UIBC: 260 ug/dL

## 2022-12-06 LAB — CBC
HCT: 40 % (ref 36.0–46.0)
Hemoglobin: 13.6 g/dL (ref 12.0–15.0)
MCH: 29.9 pg (ref 26.0–34.0)
MCHC: 34 g/dL (ref 30.0–36.0)
MCV: 87.9 fL (ref 80.0–100.0)
Platelets: 402 10*3/uL — ABNORMAL HIGH (ref 150–400)
RBC: 4.55 MIL/uL (ref 3.87–5.11)
RDW: 13.5 % (ref 11.5–15.5)
WBC: 8.3 10*3/uL (ref 4.0–10.5)
nRBC: 0 % (ref 0.0–0.2)

## 2022-12-06 LAB — FERRITIN: Ferritin: 32 ng/mL (ref 11–307)

## 2022-12-06 LAB — C-REACTIVE PROTEIN: CRP: 1.4 mg/dL — ABNORMAL HIGH (ref <1.0)

## 2022-12-06 LAB — SEDIMENTATION RATE: Sed Rate: 21 mm/hr (ref 0–22)

## 2022-12-12 NOTE — Progress Notes (Deleted)
[facility] 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  She was last seen by Dr. Ellin Saba on 08/15/2022.  Most recent IV Feraheme on 08/23/2022.  At today's visit, she reports feeling ***. ***She felt *** after IV iron. *** Iron tablet 3 times weekly *** Energy ***She reports symptoms of severe fatigue which is causing difficulty in keeping up with her ADLs. *** Also reports headaches, racing heartbeat, dyspnea on exertion, lightheadedness, and presyncopal episodes. *** She has aquagenic pruritus and Raynaud's phenomenon.  No history of DVT or PE.  *** Rectal bleeding and hemorrhoids *** Vaping *** Rheumatology evaluation (referred to Dr. Dierdre Forth in March 2024)  She has ***% energy and ***% appetite. She endorses that she is maintaining a stable weight.   ASSESSMENT & PLAN:  1.  Thrombocytosis - Progressively elevated platelets since February 2022. Labs from 05/24/2022 show platelets 607 in the context of borderline anemia (Hgb 10.4) and borderline microcytosis (MCV 80). Iron studies (05/06/2022) show iron deficiency with ferritin 18 and iron saturation 5%. - She vapes daily.  She is morbidly obese.  She has been referred to rheumatology for possible autoimmune disease (thoracic spine pain, history of previous ANA positive in 2021). - JAK2 with reflex to CALR, MPL, and E12-15 was negative.  Hematology workup showed negative ANA and rheumatoid factor, but with elevated ESR and CRP. - No history of DVT or PE. - No B symptoms.  She reports aquagenic pruritus and Raynaud's phenomenon.*** - Most recent labs  (12/06/2022): Platelets improved at 402.  Persistent iron deficiency with ferritin 32/iron saturation 14%.  CRP remains elevated but improved to 1.4.  Normal ESR. - DIFFERENTIAL DIAGNOSIS favors reactive thrombocytosis in the setting of iron deficiency, vaping, obesity, and possible autoimmune/inflammatory disease. - PLAN: No evidence of MPN or clonal thrombocytosis at this time.  Continue monitoring with periodic CBCs.  Continue treatment of iron deficiency as below.  Continue to encourage the patient stops vaping.   2.  Iron deficiency anemia - Total abdominal hysterectomy in March 2024 due to severe abnormal uterine bleeding*** - Intermittent hemorrhoid bleeding x 2 years, which is at times significant*** - Previously donated blood regularly, no blood donation for the past year. - No prior history of blood transfusion. - Taking iron tablet daily since November 2023. *** Has received IV Feraheme x 3 doses within the past 6 months, most recently on 08/23/2022 - Severe fatigue which is causing difficulty in keeping up with her ADLs; headaches, racing heartbeat, dyspnea on exertion, lightheadedness*** - Most recent labs (12/06/2022): Hgb 13.6/MCV 87.9, ferritin 32, iron saturation 14% - PLAN: Recommend IV Feraheme x 1 doses.  We discussed potential side effects.  Patient is agreeable. - Continue daily iron tablet. - Recommend referral to GI for treatment of chronic constipation and possible hemorrhoid banding. *** Patient declines at this time because she "has too much other stuff going on with her health right now."*** - Repeat CBC/D, ferritin, iron/TIBC in 4 months followed by PHONE visit ***   3.  Family history - Strong family history of breast, uterine, and ovarian cancer on her mother's side of the family: Mother had endometrial  cancer Maternal aunt with breast cancer Maternal aunt with ovarian cancer and breast cancer Maternal grandmother with breast cancer and uterine cancer.  - PLAN:  Patient was referred to genetic counselor, but did not show up for appointment.  ***     4.  Other history - PMH: Abnormal uterine bleeding, ADHD, PCOS, degenerative disc disease, hypertension, hypothyroidism, asthma, morbid obesity, anxiety/depression - SOCIAL: She lives at home with her husband and daughter.  She is currently unemployed.  She is a former smoker, switched to vaping 2 years ago and vapes frequently throughout the day.  She drinks occasional alcohol.  She denies any illicit drug use. - FAMILY: Strong family history of breast, uterine, and ovarian cancer on mother side of the family, as noted above.  PLAN SUMMARY: >> *** >> *** >> ***    REVIEW OF SYSTEMS: ***  Review of Systems - Oncology   PHYSICAL EXAM:  ECOG PERFORMANCE STATUS: {CHL ONC ECOG ZO:[id number]} *** There were no vitals filed for this visit. There were no vitals filed for this visit. Physical Exam  PAST MEDICAL/SURGICAL HISTORY:  Past Medical History:  Diagnosis Date   Abdominal pain    ADHD    ANA positive    Back pain    DDD (degenerative disc disease), lumbar    Eye pain, right    Fatigue    Frontal sinusitis    Hypertension    Hypothyroidism    Iron deficiency anemia due to chronic blood loss 06/19/2022   Medical history non-contributory    Obesity, morbid (HCC)    Palpitations    Rheumatoid arthritis (HCC)    Past Surgical History:  Procedure Laterality Date   ABLATION     05/22/2020 left side, 06/14/2020 right side    HYSTERECTOMY ABDOMINAL WITH SALPINGECTOMY     ROBOTIC ASSISTED TOTAL HYSTERECTOMY Bilateral 07/30/2022   Procedure: XI ROBOTIC ASSISTED TOTAL HYSTERECTOMY AND BILATERAL SALPINGECTOMY;  Surgeon: Lazaro Arms, MD;  Location: [facility];  Service: Gynecology;  Laterality: Bilateral;    SOCIAL HISTORY:  Social History   Socioeconomic History   Marital status: Married    Spouse name: Not on file   Number of children: 1   Years of education: Not on file   Highest  education level: Associate degree: academic program  Occupational History   Not on file  Tobacco Use   Smoking status: Former    Current packs/day: 0.00    Average packs/day: 1 pack/day for 15.0 years (15.0 ttl pk-yrs)    Types: Cigarettes    Start date: 11/24/2005    Quit date: 11/24/2020    Years since quitting: 2.0   Smokeless tobacco: Never   Tobacco comments:    She has quit of and on.  Vaping Use   Vaping status: Every Day  Substance and Sexual Activity   Alcohol use: Yes    Comment: occasionally /social events   Drug use: Not Currently    Comment: smoked weed during teen years   Sexual activity: Not Currently    Birth control/protection: Surgical    Comment: hyst  Other Topics Concern   Not on file  Social History Narrative   Married for May 2021.Lives with husband and daughter.Homemaker.Husband works at [employer].   Social Determinants of Health   Financial Resource Strain: Low Risk  (09/23/2022)   Overall Financial Resource Strain (CARDIA)    Difficulty of Paying Living Expenses: Not very hard  Food Insecurity: Food Insecurity Present (09/23/2022)   Hunger Vital  Sign    Worried About Programme researcher, broadcasting/film/video in the Last Year: Sometimes true    Ran Out of Food in the Last Year: Never true  Transportation Needs: No Transportation Needs (09/23/2022)   PRAPARE - Administrator, Civil Service (Medical): No    Lack of Transportation (Non-Medical): No  Physical Activity: Unknown (09/23/2022)   Exercise Vital Sign    Days of Exercise per Week: Patient declined    Minutes of Exercise per Session: Not on file  Stress: Stress Concern Present (09/23/2022)   [employer] of Occupational Health - Occupational Stress Questionnaire    Feeling of Stress : Very much  Social Connections: Moderately Isolated (09/23/2022)   Social Connection and Isolation Panel [NHANES]    Frequency of Communication with Friends and Family: More than three times a week     Frequency of Social Gatherings with Friends and Family: Twice a week    Attends Religious Services: Never    Database administrator or Organizations: No    Attends Engineer, structural: Not on file    Marital Status: Married  Catering manager Violence: Not At Risk (06/19/2022)   Humiliation, Afraid, Rape, and Kick questionnaire    Fear of Current or Ex-Partner: No    Emotionally Abused: No    Physically Abused: No    Sexually Abused: No    FAMILY HISTORY:  Family History  Problem Relation Age of Onset   Cervical cancer Maternal Grandmother    Breast cancer Maternal Grandmother    Skin cancer Maternal Grandmother    Heart attack Maternal Grandfather    Diabetes Father    Heart attack Father    Hyperlipidemia Mother    Peripheral Artery Disease Mother    ADD / ADHD Mother    Bipolar disorder Mother    Cervical cancer Mother        at age 17.   ADD / ADHD Sister    Healthy Daughter    Breast cancer Maternal Aunt    Cervical cancer Maternal Aunt    ADD / ADHD Maternal Aunt    Uterine cancer Maternal Aunt    Breast cancer Maternal Aunt    Multiple sclerosis Paternal Aunt    Colon cancer Neg Hx     CURRENT MEDICATIONS:  Outpatient Encounter Medications as of 12/13/2022  Medication Sig   acetaminophen (TYLENOL) 500 MG tablet Take 500-1,000 mg by mouth every 6 (six) hours as needed for moderate pain.   albuterol (VENTOLIN HFA) 108 (90 Base) MCG/ACT inhaler Inhale 2 puffs into the lungs every 6 (six) hours as needed for wheezing or shortness of breath.   amphetamine-dextroamphetamine (ADDERALL XR) 20 MG 24 hr capsule Take 20 mg by mouth daily.   amphetamine-dextroamphetamine (ADDERALL) 5 MG tablet Take 5 mg by mouth daily in the afternoon.   azelastine (ASTELIN) 0.1 % nasal spray 2 sprays per nostril 1-2 times daily as needed.   Cholecalciferol (VITAMIN D3) 25 MCG (1000 UT) CAPS Take 1 capsule (1,000 Units total) by mouth daily.   FLUoxetine (PROZAC) 10 MG capsule Take  10 mg by mouth daily.   fluticasone (FLOVENT HFA) 110 MCG/ACT inhaler Inhale 2 puffs into the lungs 2 (two) times daily. With spacer.   Galcanezumab-gnlm (EMGALITY) 120 MG/ML SOAJ Inject 1 pen  into the skin every 30 (thirty) days.   ibuprofen (ADVIL) 200 MG tablet Take 400 mg by mouth every 6 (six) hours as needed for moderate pain.   levocetirizine (XYZAL)  5 MG tablet Take 1 tablet (5 mg total) by mouth daily.   levothyroxine (SYNTHROID) 125 MCG tablet Take 1 tablet (125 mcg total) by mouth daily.   olmesartan-hydrochlorothiazide (BENICAR HCT) 20-12.5 MG tablet TAKE 1 TABLET BY MOUTH DAILY   potassium chloride SA (KLOR-CON M) 20 MEQ tablet Take 1 tablet (20 mEq total) by mouth 3 (three) times daily.   rizatriptan (MAXALT-MLT) 10 MG disintegrating tablet DISSOLVE ONE TABLET BY MOUTH AS NEEDED FOR MIGRAINE. MAY REPEAT IN 2 HOURS IF NEEDED. NO MORE THAN 2 TABLETS DAILY OR 10 TABLETS A MONTH.   tiZANidine (ZANAFLEX) 4 MG tablet Take 1 tablet (4 mg total) by mouth at bedtime as needed for muscle spasms.   triamcinolone (NASACORT) 55 MCG/ACT AERO nasal inhaler Place 1 spray into the nose daily.   No facility-administered encounter medications on file as of 12/13/2022.    ALLERGIES:  Allergies  Allergen Reactions   Poison Ivy Extract Anaphylaxis   Poison Oak Extract Anaphylaxis   Anser Anser Feather (Goose) Allergy Skin Test    Flexeril [Cyclobenzaprine] Hives   Lactose Intolerance (Gi) Diarrhea   Honeysuckle Flower [Lonicera] Rash   Mixed Grasses Rash    LABORATORY DATA:  I have reviewed the labs as listed.  CBC    Component Value Date/Time   WBC 8.3 12/06/2022 0804   RBC 4.55 12/06/2022 0804   HGB 13.6 12/06/2022 0804   HGB 14.0 09/23/2022 1037   HCT 40.0 12/06/2022 0804   HCT 42.6 09/23/2022 1037   PLT 402 (H) 12/06/2022 0804   PLT 422 09/23/2022 1037   MCV 87.9 12/06/2022 0804   MCV 85 09/23/2022 1037   MCH 29.9 12/06/2022 0804   MCHC 34.0 12/06/2022 0804   RDW 13.5  12/06/2022 0804   RDW 18.2 (H) 09/23/2022 1037   LYMPHSABS 2.4 09/23/2022 1037   MONOABS 0.7 08/12/2022 0930   EOSABS 0.2 09/23/2022 1037   BASOSABS 0.1 09/23/2022 1037      Latest Ref Rng & Units 10/23/2022    9:39 AM 09/23/2022   10:37 AM 07/30/2022    7:09 AM  CMP  Glucose 70 - 99 mg/dL 93  90  91   BUN 6 - 20 mg/dL 7  3  7    Creatinine 0.57 - 1.00 mg/dL 7.82  9.56  2.13   Sodium 134 - 144 mmol/L 138  140  141   Potassium 3.5 - 5.2 mmol/L 4.2  3.9  4.1   Chloride 96 - 106 mmol/L 101  103  110   CO2 20 - 29 mmol/L 20  21    Calcium 8.7 - 10.2 mg/dL 9.6  9.5    Total Protein 6.0 - 8.5 g/dL  7.2    Total Bilirubin 0.0 - 1.2 mg/dL  <0.8    Alkaline Phos 44 - 121 IU/L  69    AST 0 - 40 IU/L  17    ALT 0 - 32 IU/L  20      DIAGNOSTIC IMAGING:  I have independently reviewed the relevant imaging and discussed with the patient.   WRAP UP:  All questions were answered. The patient knows to call the clinic with any problems, questions or concerns.  Medical decision making: ***  Time spent on visit: I spent *** minutes counseling the patient face to face. The total time spent in the appointment was *** minutes and more than 50% was on counseling.  Carnella Guadalajara, PA-C  ***

## 2022-12-13 ENCOUNTER — Inpatient Hospital Stay: Payer: Medicaid Other | Admitting: Physician Assistant

## 2022-12-13 ENCOUNTER — Encounter: Payer: Self-pay | Admitting: Physician Assistant

## 2022-12-13 NOTE — Progress Notes (Unsigned)
[facility] 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 32 y.o. 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  She was last seen by Dr. Ellin Saba on 08/15/2022.  Most recent IV Feraheme on 08/23/2022.  She felt improved energy after IV iron, but has been feeling her energy lag over the past 2-3 weeks.  She has not been taking her iron tablet for the past several months.  She denies any headaches, racing heartbeat, dyspnea on exertion, lightheadedness, and presyncopal episodes. She has aquagenic pruritus and Raynaud's phenomenon.  No history of DVT or PE.  She does note bilateral leg swelling, left more than right, that has been progressive for several years and reportedly began around the time that she broke her left ankle; swelling worsens throughout the day as she spends time on her feet, but improves overnight although it does not completely resolve.  She no longer has periods, as she is s/p total abdominal hysterectomy in March 2024.  She has occasional scant rectal bleeding associated with constipation.  No gross hematochezia or melena.  She has 40% energy and 100% appetite. She endorses that she is maintaining a stable weight.  ASSESSMENT & PLAN:  1.  Thrombocytosis - Progressively elevated platelets since February 2022. Labs from 05/24/2022 show platelets 607 in the context of borderline anemia (Hgb 10.4) and borderline microcytosis (MCV 80). Iron studies (05/06/2022) show iron deficiency with ferritin 18 and iron saturation 5%. - She vapes daily.  She is morbidly obese.  She has been referred to rheumatology for possible autoimmune disease (thoracic spine pain,  history of previous ANA positive in 2021). - JAK2 with reflex to CALR, MPL, and E12-15 was negative.  Hematology workup showed negative ANA and rheumatoid factor, but with elevated ESR and CRP. - No history of DVT or PE. - No B symptoms.  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 - Most recent labs (12/06/2022): Platelets improved at 402.  Persistent iron deficiency with ferritin 32/iron saturation 14%.  CRP remains elevated but improved to 1.4.  Normal ESR. - DIFFERENTIAL DIAGNOSIS favors reactive thrombocytosis in the setting of iron deficiency, vaping, obesity, and possible autoimmune/inflammatory disease. - PLAN: No evidence of MPN or clonal thrombocytosis at this time.  Continue monitoring with periodic CBCs.  Continue treatment of iron deficiency as below.  Continue to encourage the patient stops vaping.   2.  Iron deficiency anemia - Total abdominal hysterectomy in March 2024 due to severe abnormal uterine bleeding - Severe anemia in December 2023 with Hgb down to 7.7. - Intermittent hemorrhoid bleeding  - Previously donated blood regularly, no blood donation for the past year. - No prior history of blood transfusion. - Taking iron tablet daily since November 2023.  Has received IV Feraheme x 3 doses within the past 6 months, most recently on 08/23/2022 - Most recent labs (12/06/2022): Hgb 13.6/MCV 87.9, ferritin 32, iron saturation 14% - PLAN: Recommend IV Feraheme x 1 dose. - Patient instructed to restart iron supplement every other day - We will check stool cards x 3 - Recommend referral to GI for treatment of chronic constipation and possible hemorrhoid banding.  Patient declines at this time because she "has too much other  stuff going on with her health right now." - Repeat CBC/D, ferritin, iron/TIBC in 4 months followed by OFFICE visit    3.  Family history - Strong family history of breast, uterine, and ovarian cancer on her mother's side of the family: Mother had  endometrial cancer Maternal aunt with breast cancer Maternal aunt with ovarian cancer and breast cancer Maternal grandmother with breast cancer and uterine cancer.  - PLAN: Patient was referred to genetic counselor, but did not show up for appointment.       4.  Other history - PMH: Abnormal uterine bleeding, ADHD, PCOS, degenerative disc disease, hypertension, hypothyroidism, asthma, morbid obesity, anxiety/depression - SOCIAL: She lives at home with her husband and daughter.  She is currently unemployed.  She is a former smoker, switched to vaping 2 years ago and vapes frequently throughout the day.  She drinks occasional alcohol.  She denies any illicit drug use. - FAMILY: Strong family history of breast, uterine, and ovarian cancer on mother side of the family, as noted above.  PLAN SUMMARY: >> Stool cards x 3 >> IV Feraheme x 1 >> Labs in 4 months = CBC/D, ferritin, iron/TIBC >> OFFICE visit in 4 months  **Please send another referral to Dr. Charlene Brooke rheumatology for history ANA positive, joint pain, muscle pain      REVIEW OF SYSTEMS:   Review of Systems  Constitutional:  Positive for fatigue. Negative for appetite change, chills, diaphoresis, fever and unexpected weight change.  HENT:   Negative for lump/mass and nosebleeds.   Eyes:  Negative for eye problems.  Respiratory:  Negative for cough, hemoptysis and shortness of breath.   Cardiovascular:  Negative for chest pain, leg swelling and palpitations.  Gastrointestinal:  Negative for abdominal pain, blood in stool, constipation, diarrhea, nausea and vomiting.  Genitourinary:  Negative for hematuria.   Musculoskeletal:  Positive for arthralgias.  Skin: Negative.   Neurological:  Positive for dizziness ("motion sickness") and numbness. Negative for headaches and light-headedness.  Hematological:  Does not bruise/bleed easily.  Psychiatric/Behavioral:  Positive for sleep disturbance.     PHYSICAL EXAM:  ECOG  PERFORMANCE STATUS: 1 - Symptomatic but completely ambulatory  Vitals:   12/17/22 0856  BP: (!) 144/85  Pulse: 81  Resp: 18  Temp: 98 F (36.7 C)  SpO2: 99%   Filed Weights   12/17/22 0856  Weight: 285 lb 11.5 oz (129.6 kg)   Physical Exam Constitutional:      Appearance: Normal appearance. She is morbidly obese.  Cardiovascular:     Heart sounds: Normal heart sounds.  Pulmonary:     Breath sounds: Normal breath sounds.  Musculoskeletal:     Right lower leg: Edema (nonpitting edema) present.     Left lower leg: Edema (nonpitting edema) present.  Neurological:     General: No focal deficit present.     Mental Status: Mental status is at baseline.  Psychiatric:        Behavior: Behavior normal. Behavior is cooperative.     PAST MEDICAL/SURGICAL HISTORY:  Past Medical History:  Diagnosis Date   Abdominal pain    ADHD    ANA positive    Back pain    DDD (degenerative disc disease), lumbar    Eye pain, right    Fatigue    Frontal sinusitis    Hypertension    Hypothyroidism    Iron deficiency anemia due to chronic blood loss 06/19/2022   Medical history non-contributory    Obesity, morbid (HCC)  Palpitations    Rheumatoid arthritis [facility])    Past Surgical History:  Procedure Laterality Date   ABLATION     05/22/2020 left side, 06/14/2020 right side    HYSTERECTOMY ABDOMINAL WITH SALPINGECTOMY     ROBOTIC ASSISTED TOTAL HYSTERECTOMY Bilateral 07/30/2022   Procedure: XI ROBOTIC ASSISTED TOTAL HYSTERECTOMY AND BILATERAL SALPINGECTOMY;  Surgeon: Lazaro Arms, MD;  Location: [facility];  Service: Gynecology;  Laterality: Bilateral;    SOCIAL HISTORY:  Social History   Socioeconomic History   Marital status: Married    Spouse name: Not on file   Number of children: 1   Years of education: Not on file   Highest education level: Associate degree: academic program  Occupational History   Not on file  Tobacco Use   Smoking status: Former    Current packs/day:  0.00    Average packs/day: 1 pack/day for 15.0 years (15.0 ttl pk-yrs)    Types: Cigarettes    Start date: 11/24/2005    Quit date: 11/24/2020    Years since quitting: 2.0   Smokeless tobacco: Never   Tobacco comments:    She has quit of and on.  Vaping Use   Vaping status: Every Day  Substance and Sexual Activity   Alcohol use: Yes    Comment: occasionally /social events   Drug use: Not Currently    Comment: smoked weed during teen years   Sexual activity: Not Currently    Birth control/protection: Surgical    Comment: hyst  Other Topics Concern   Not on file  Social History Narrative   Married for May 2021.Lives with husband and daughter.Homemaker.Husband works at [employer].   Social Determinants of Health   Financial Resource Strain: Low Risk  (09/23/2022)   Overall Financial Resource Strain (CARDIA)    Difficulty of Paying Living Expenses: Not very hard  Food Insecurity: Food Insecurity Present (09/23/2022)   Hunger Vital Sign    Worried About Running Out of Food in the Last Year: Sometimes true    Ran Out of Food in the Last Year: Never true  Transportation Needs: No Transportation Needs (09/23/2022)   PRAPARE - Administrator, Civil Service (Medical): No    Lack of Transportation (Non-Medical): No  Physical Activity: Unknown (09/23/2022)   Exercise Vital Sign    Days of Exercise per Week: Patient declined    Minutes of Exercise per Session: Not on file  Stress: Stress Concern Present (09/23/2022)   [employer] of Occupational Health - Occupational Stress Questionnaire    Feeling of Stress : Very much  Social Connections: Moderately Isolated (09/23/2022)   Social Connection and Isolation Panel [NHANES]    Frequency of Communication with Friends and Family: More than three times a week    Frequency of Social Gatherings with Friends and Family: Twice a week    Attends Religious Services: Never    Database administrator or Organizations: No     Attends Engineer, structural: Not on file    Marital Status: Married  Catering manager Violence: Not At Risk (06/19/2022)   Humiliation, Afraid, Rape, and Kick questionnaire    Fear of Current or Ex-Partner: No    Emotionally Abused: No    Physically Abused: No    Sexually Abused: No    FAMILY HISTORY:  Family History  Problem Relation Age of Onset   Cervical cancer Maternal Grandmother    Breast cancer Maternal Grandmother    Skin cancer Maternal Grandmother  Heart attack Maternal Grandfather    Diabetes Father    Heart attack Father    Hyperlipidemia Mother    Peripheral Artery Disease Mother    ADD / ADHD Mother    Bipolar disorder Mother    Cervical cancer Mother        at age 3.   ADD / ADHD Sister    Healthy Daughter    Breast cancer Maternal Aunt    Cervical cancer Maternal Aunt    ADD / ADHD Maternal Aunt    Uterine cancer Maternal Aunt    Breast cancer Maternal Aunt    Multiple sclerosis Paternal Aunt    Colon cancer Neg Hx     CURRENT MEDICATIONS:  Outpatient Encounter Medications as of 12/17/2022  Medication Sig   acetaminophen (TYLENOL) 500 MG tablet Take 500-1,000 mg by mouth every 6 (six) hours as needed for moderate pain.   albuterol (VENTOLIN HFA) 108 (90 Base) MCG/ACT inhaler Inhale 2 puffs into the lungs every 6 (six) hours as needed for wheezing or shortness of breath.   amphetamine-dextroamphetamine (ADDERALL XR) 20 MG 24 hr capsule Take 20 mg by mouth daily.   amphetamine-dextroamphetamine (ADDERALL) 5 MG tablet Take 5 mg by mouth daily in the afternoon.   azelastine (ASTELIN) 0.1 % nasal spray 2 sprays per nostril 1-2 times daily as needed.   Cholecalciferol (VITAMIN D3) 25 MCG (1000 UT) CAPS Take 1 capsule (1,000 Units total) by mouth daily.   FLUoxetine (PROZAC) 10 MG capsule Take 10 mg by mouth daily.   fluticasone (FLOVENT HFA) 110 MCG/ACT inhaler Inhale 2 puffs into the lungs 2 (two) times daily. With spacer.   Galcanezumab-gnlm  (EMGALITY) 120 MG/ML SOAJ Inject 1 pen  into the skin every 30 (thirty) days.   ibuprofen (ADVIL) 200 MG tablet Take 400 mg by mouth every 6 (six) hours as needed for moderate pain.   levocetirizine (XYZAL) 5 MG tablet Take 1 tablet (5 mg total) by mouth daily.   levothyroxine (SYNTHROID) 125 MCG tablet Take 1 tablet (125 mcg total) by mouth daily.   olmesartan-hydrochlorothiazide (BENICAR HCT) 20-12.5 MG tablet TAKE 1 TABLET BY MOUTH DAILY   potassium chloride SA (KLOR-CON M) 20 MEQ tablet Take 1 tablet (20 mEq total) by mouth 3 (three) times daily.   rizatriptan (MAXALT-MLT) 10 MG disintegrating tablet DISSOLVE ONE TABLET BY MOUTH AS NEEDED FOR MIGRAINE. MAY REPEAT IN 2 HOURS IF NEEDED. NO MORE THAN 2 TABLETS DAILY OR 10 TABLETS A MONTH.   tiZANidine (ZANAFLEX) 4 MG tablet Take 1 tablet (4 mg total) by mouth at bedtime as needed for muscle spasms.   triamcinolone (NASACORT) 55 MCG/ACT AERO nasal inhaler Place 1 spray into the nose daily.   No facility-administered encounter medications on file as of 12/17/2022.    ALLERGIES:  Allergies  Allergen Reactions   Poison Ivy Extract Anaphylaxis   Poison Oak Extract Anaphylaxis   Anser Anser Feather (Goose) Allergy Skin Test    Flexeril [Cyclobenzaprine] Hives   Lactose Intolerance (Gi) Diarrhea   Honeysuckle Flower [Lonicera] Rash   Mixed Grasses Rash    LABORATORY DATA:  I have reviewed the labs as listed.  CBC    Component Value Date/Time   WBC 8.3 12/06/2022 0804   RBC 4.55 12/06/2022 0804   HGB 13.6 12/06/2022 0804   HGB 14.0 09/23/2022 1037   HCT 40.0 12/06/2022 0804   HCT 42.6 09/23/2022 1037   PLT 402 (H) 12/06/2022 0804   PLT 422 09/23/2022 1037  MCV 87.9 12/06/2022 0804   MCV 85 09/23/2022 1037   MCH 29.9 12/06/2022 0804   MCHC 34.0 12/06/2022 0804   RDW 13.5 12/06/2022 0804   RDW 18.2 (H) 09/23/2022 1037   LYMPHSABS 2.4 09/23/2022 1037   MONOABS 0.7 08/12/2022 0930   EOSABS 0.2 09/23/2022 1037   BASOSABS 0.1  09/23/2022 1037      Latest Ref Rng & Units 10/23/2022    9:39 AM 09/23/2022   10:37 AM 07/30/2022    7:09 AM  CMP  Glucose 70 - 99 mg/dL 93  90  91   BUN 6 - 20 mg/dL 7  3  7    Creatinine 0.57 - 1.00 mg/dL 5.40  9.81  1.91   Sodium 134 - 144 mmol/L 138  140  141   Potassium 3.5 - 5.2 mmol/L 4.2  3.9  4.1   Chloride 96 - 106 mmol/L 101  103  110   CO2 20 - 29 mmol/L 20  21    Calcium 8.7 - 10.2 mg/dL 9.6  9.5    Total Protein 6.0 - 8.5 g/dL  7.2    Total Bilirubin 0.0 - 1.2 mg/dL  <4.7    Alkaline Phos 44 - 121 IU/L  69    AST 0 - 40 IU/L  17    ALT 0 - 32 IU/L  20      DIAGNOSTIC IMAGING:  I have independently reviewed the relevant imaging and discussed with the patient.   WRAP UP:  All questions were answered. The patient knows to call the clinic with any problems, questions or concerns.  Medical decision making: Moderate  Time spent on visit: I spent 20 minutes counseling the patient face to face. The total time spent in the appointment was 30 minutes and more than 50% was on counseling.  Carnella Guadalajara, PA-C  12/17/22 9:47 AM

## 2022-12-17 ENCOUNTER — Inpatient Hospital Stay: Payer: Medicaid Other | Admitting: Physician Assistant

## 2022-12-17 VITALS — BP 144/85 | HR 81 | Temp 98.0°F | Resp 18 | Wt 285.7 lb

## 2022-12-17 DIAGNOSIS — D500 Iron deficiency anemia secondary to blood loss (chronic): Secondary | ICD-10-CM | POA: Diagnosis not present

## 2022-12-17 DIAGNOSIS — D509 Iron deficiency anemia, unspecified: Secondary | ICD-10-CM | POA: Diagnosis not present

## 2022-12-17 DIAGNOSIS — D75839 Thrombocytosis, unspecified: Secondary | ICD-10-CM

## 2022-12-17 NOTE — Patient Instructions (Signed)
[facility] **VISIT SUMMARY & IMPORTANT INSTRUCTIONS **   You were seen today by Rojelio Brenner PA-C for your follow-up visit.    ELEVATED PLATELETS Your platelets remain mildly elevated, which is likely reactive due to iron deficiency, obesity, and vaping.  IRON DEFICIENCY Please complete stool cards x 3 to see if you have any blood in your bowel movements. We will schedule you for IV iron x 1 Please restart your iron supplement (325 mg ferrous sulfate every other day).  FOLLOW-UP APPOINTMENT: Labs and office visit in 4 months  ** Thank you for trusting me with your healthcare!  I strive to provide all of my patients with quality care at each visit.  If you receive a survey for this visit, I would be so grateful to you for taking the time to provide feedback.  Thank you in advance!  ~ Felesha Moncrieffe                   Dr. Doreatha Massed   &   Rojelio Brenner, PA-C   - - - - - - - - - - - - - - - - - -    Thank you for choosing [facility] at [facility] to provide your oncology and hematology care.  To afford each patient quality time with our provider, please arrive at least 15 minutes before your scheduled appointment time.   If you have a lab appointment with the Cancer Center please come in thru the Main Entrance and check in at the main information desk.  You need to re-schedule your appointment should you arrive 10 or more minutes late.  We strive to give you quality time with our providers, and arriving late affects you and other patients whose appointments are after yours.  Also, if you no show three or more times for appointments you may be dismissed from the clinic at the providers discretion.     Again, thank you for choosing [facility].  Our hope is that these requests will decrease the amount of time that you wait before being seen by our physicians.        _____________________________________________________________  Should you have questions after your visit to [facility], please contact our office at [phone] and follow the prompts.  Our office hours are 8:00 a.m. and 4:30 p.m. Monday - Friday.  Please note that voicemails left after 4:00 p.m. may not be returned until the following business day.  We are closed weekends and major holidays.  You do have access to a nurse 24-7, just call the main number to the clinic [phone] and do not press any options, hold on the line and a nurse will answer the phone.    For prescription refill requests, have your pharmacy contact our office and allow 72 hours.

## 2022-12-17 NOTE — Addendum Note (Signed)
Addended by: Rojelio Brenner on: 12/17/2022 09:51 AM   Modules accepted: Orders

## 2022-12-20 ENCOUNTER — Inpatient Hospital Stay: Payer: Medicaid Other

## 2022-12-20 VITALS — BP 116/81 | HR 62 | Temp 97.9°F | Resp 18

## 2022-12-20 DIAGNOSIS — D509 Iron deficiency anemia, unspecified: Secondary | ICD-10-CM | POA: Diagnosis not present

## 2022-12-20 DIAGNOSIS — D500 Iron deficiency anemia secondary to blood loss (chronic): Secondary | ICD-10-CM

## 2022-12-20 MED ORDER — CETIRIZINE HCL 10 MG PO TABS
10.0000 mg | ORAL_TABLET | Freq: Once | ORAL | Status: AC
  Administered 2022-12-20: 10 mg via ORAL
  Filled 2022-12-20: qty 1

## 2022-12-20 MED ORDER — SODIUM CHLORIDE 0.9 % IV SOLN: Freq: Once | INTRAVENOUS | Status: AC

## 2022-12-20 MED ORDER — ACETAMINOPHEN 325 MG PO TABS
650.0000 mg | ORAL_TABLET | Freq: Once | ORAL | Status: AC
  Administered 2022-12-20: 650 mg via ORAL
  Filled 2022-12-20: qty 2

## 2022-12-20 MED ORDER — SODIUM CHLORIDE 0.9 % IV SOLN
510.0000 mg | Freq: Once | INTRAVENOUS | Status: AC
  Administered 2022-12-20: 510 mg via INTRAVENOUS
  Filled 2022-12-20: qty 510

## 2022-12-20 NOTE — Patient Instructions (Signed)
[facility]  Discharge Instructions: Thank you for choosing [facility] to provide your oncology and hematology care.  If you have a lab appointment with the Cancer Center - please note that after April 8th, 2024, all labs will be drawn in the cancer center.  You do not have to check in or register with the main entrance as you have in the past but will complete your check-in in the cancer center.  Wear comfortable clothing and clothing appropriate for easy access to any Portacath or PICC line.   We strive to give you quality time with your provider. You may need to reschedule your appointment if you arrive late (15 or more minutes).  Arriving late affects you and other patients whose appointments are after yours.  Also, if you miss three or more appointments without notifying the office, you may be dismissed from the clinic at the provider's discretion.      For prescription refill requests, have your pharmacy contact our office and allow 72 hours for refills to be completed.    Today you received the following chemotherapy and/or immunotherapy agents Feraheme      To help prevent nausea and vomiting after your treatment, we encourage you to take your nausea medication as directed.  BELOW ARE SYMPTOMS THAT SHOULD BE REPORTED IMMEDIATELY: *FEVER GREATER THAN 100.4 F (38 C) OR HIGHER *CHILLS OR SWEATING *NAUSEA AND VOMITING THAT IS NOT CONTROLLED WITH YOUR NAUSEA MEDICATION *UNUSUAL SHORTNESS OF BREATH *UNUSUAL BRUISING OR BLEEDING *URINARY PROBLEMS (pain or burning when urinating, or frequent urination) *BOWEL PROBLEMS (unusual diarrhea, constipation, pain near the anus) TENDERNESS IN MOUTH AND THROAT WITH OR WITHOUT PRESENCE OF ULCERS (sore throat, sores in mouth, or a toothache) UNUSUAL RASH, SWELLING OR PAIN  UNUSUAL VAGINAL DISCHARGE OR ITCHING   Items with * indicate a potential emergency and should be followed up as soon as possible or go to the  Emergency Department if any problems should occur.  Please show the CHEMOTHERAPY ALERT CARD or IMMUNOTHERAPY ALERT CARD at check-in to the Emergency Department and triage nurse.  Should you have questions after your visit or need to cancel or reschedule your appointment, please contact [facility] [phone]  and follow the prompts.  Office hours are 8:00 a.m. to 4:30 p.m. Monday - Friday. Please note that voicemails left after 4:00 p.m. may not be returned until the following business day.  We are closed weekends and major holidays. You have access to a nurse at all times for urgent questions. Please call the main number to the clinic [phone] and follow the prompts.  For any non-urgent questions, you may also contact your provider using MyChart. We now offer e-Visits for anyone 18 and older to request care online for non-urgent symptoms. For details visit [email or web address].   Also download the MyChart app! Go to the app store, search "MyChart", open the app, select [facility], and log in with your MyChart username and password.   

## 2022-12-20 NOTE — Progress Notes (Signed)
Patient presents today for Feraheme infusion per providers order.  Vital signs WNL.  Patient has no new complaints at this time.  Peripheral IV started and blood return noted pre and post infusion.    Stable during infusion without adverse affects.  Vital signs stable.  No complaints at this time.  Discharge from clinic ambulatory in stable condition.  Alert and oriented X 3.  Follow up with [facility] as scheduled.  

## 2023-01-08 ENCOUNTER — Other Ambulatory Visit (HOSPITAL_COMMUNITY)
Admission: RE | Admit: 2023-01-08 | Discharge: 2023-01-08 | Disposition: A | Discharge status: Home / Self Care | Payer: Medicaid Other | Source: Ambulatory Visit | Attending: Physician Assistant | Admitting: Physician Assistant

## 2023-01-08 DIAGNOSIS — D75839 Thrombocytosis, unspecified: Secondary | ICD-10-CM | POA: Diagnosis present

## 2023-01-08 DIAGNOSIS — D500 Iron deficiency anemia secondary to blood loss (chronic): Secondary | ICD-10-CM | POA: Diagnosis present

## 2023-01-08 LAB — OCCULT BLOOD X 1 CARD TO LAB, STOOL
Fecal Occult Bld: NEGATIVE
Fecal Occult Bld: POSITIVE — AB
Fecal Occult Bld: POSITIVE — AB

## 2023-01-13 DIAGNOSIS — F439 Reaction to severe stress, unspecified: Secondary | ICD-10-CM | POA: Diagnosis not present

## 2023-01-13 DIAGNOSIS — F988 Other specified behavioral and emotional disorders with onset usually occurring in childhood and adolescence: Secondary | ICD-10-CM | POA: Diagnosis not present

## 2023-01-13 DIAGNOSIS — F329 Major depressive disorder, single episode, unspecified: Secondary | ICD-10-CM | POA: Diagnosis not present

## 2023-01-13 NOTE — Progress Notes (Signed)
Patient notified of results and that we will refer to GI for assessment.

## 2023-01-20 ENCOUNTER — Emergency Department (HOSPITAL_COMMUNITY)
Admission: EM | Admit: 2023-01-20 | Discharge: 2023-01-20 | Disposition: A | Discharge status: Home / Self Care | Payer: Medicaid Other | Attending: Emergency Medicine | Admitting: Emergency Medicine

## 2023-01-20 ENCOUNTER — Other Ambulatory Visit: Payer: Self-pay

## 2023-01-20 ENCOUNTER — Encounter (HOSPITAL_COMMUNITY): Payer: Self-pay | Admitting: Emergency Medicine

## 2023-01-20 DIAGNOSIS — R109 Unspecified abdominal pain: Secondary | ICD-10-CM | POA: Insufficient documentation

## 2023-01-20 DIAGNOSIS — R112 Nausea with vomiting, unspecified: Secondary | ICD-10-CM | POA: Insufficient documentation

## 2023-01-20 LAB — CBC WITH DIFFERENTIAL/PLATELET
Abs Immature Granulocytes: 0.05 10*3/uL (ref 0.00–0.07)
Basophils Absolute: 0.1 10*3/uL (ref 0.0–0.1)
Basophils Relative: 1 %
Eosinophils Absolute: 0 10*3/uL (ref 0.0–0.5)
Eosinophils Relative: 0 %
HCT: 38.5 % (ref 36.0–46.0)
Hemoglobin: 13.3 g/dL (ref 12.0–15.0)
Immature Granulocytes: 1 %
Lymphocytes Relative: 14 %
Lymphs Abs: 1.5 10*3/uL (ref 0.7–4.0)
MCH: 30 pg (ref 26.0–34.0)
MCHC: 34.5 g/dL (ref 30.0–36.0)
MCV: 86.7 fL (ref 80.0–100.0)
Monocytes Absolute: 0.5 10*3/uL (ref 0.1–1.0)
Monocytes Relative: 5 %
Neutro Abs: 8.5 10*3/uL — ABNORMAL HIGH (ref 1.7–7.7)
Neutrophils Relative %: 79 %
Platelets: 401 10*3/uL — ABNORMAL HIGH (ref 150–400)
RBC: 4.44 MIL/uL (ref 3.87–5.11)
RDW: 13.6 % (ref 11.5–15.5)
WBC: 10.7 10*3/uL — ABNORMAL HIGH (ref 4.0–10.5)
nRBC: 0 % (ref 0.0–0.2)

## 2023-01-20 LAB — LIPASE, BLOOD: Lipase: 24 U/L (ref 11–51)

## 2023-01-20 LAB — HCG, SERUM, QUALITATIVE: Preg, Serum: NEGATIVE

## 2023-01-20 LAB — COMPREHENSIVE METABOLIC PANEL
ALT: 19 U/L (ref 0–44)
AST: 14 U/L — ABNORMAL LOW (ref 15–41)
Albumin: 3.8 g/dL (ref 3.5–5.0)
Alkaline Phosphatase: 63 U/L (ref 38–126)
Anion gap: 9 (ref 5–15)
BUN: 14 mg/dL (ref 6–20)
CO2: 21 mmol/L — ABNORMAL LOW (ref 22–32)
Calcium: 8.9 mg/dL (ref 8.9–10.3)
Chloride: 99 mmol/L (ref 98–111)
Creatinine, Ser: 0.8 mg/dL (ref 0.44–1.00)
GFR, Estimated: >60 mL/min (ref >60)
Glucose, Bld: 115 mg/dL — ABNORMAL HIGH (ref 70–99)
Potassium: 3.4 mmol/L — ABNORMAL LOW (ref 3.5–5.1)
Sodium: 129 mmol/L — ABNORMAL LOW (ref 135–145)
Total Bilirubin: 0.5 mg/dL (ref 0.3–1.2)
Total Protein: 7.4 g/dL (ref 6.5–8.1)

## 2023-01-20 MED ORDER — SODIUM CHLORIDE 0.9 % IV BOLUS
1000.0000 mL | Freq: Once | INTRAVENOUS | Status: AC
  Administered 2023-01-20: 1000 mL via INTRAVENOUS

## 2023-01-20 MED ORDER — METOCLOPRAMIDE HCL 5 MG/ML IJ SOLN
10.0000 mg | Freq: Once | INTRAMUSCULAR | Status: AC
  Administered 2023-01-20: 10 mg via INTRAVENOUS
  Filled 2023-01-20: qty 2

## 2023-01-20 MED ORDER — HYDROMORPHONE HCL 1 MG/ML IJ SOLN
1.0000 mg | Freq: Once | INTRAMUSCULAR | Status: AC
  Administered 2023-01-20: 1 mg via INTRAVENOUS
  Filled 2023-01-20: qty 1

## 2023-01-20 MED ORDER — ONDANSETRON HCL 4 MG/2ML IJ SOLN
4.0000 mg | Freq: Once | INTRAMUSCULAR | Status: AC
  Administered 2023-01-20: 4 mg via INTRAVENOUS
  Filled 2023-01-20: qty 2

## 2023-01-20 NOTE — ED Provider Notes (Signed)
[place] EMERGENCY DEPARTMENT AT [facility] Provider Note   CSN: [id number] Arrival date & time: 01/20/23  0034     History {Add pertinent medical, surgical, social history, OB history to HPI:1} Chief Complaint  Patient presents with   Nerve Pain   Emesis   Abdominal Pain   Chest Pain    Kristin Baker is a 32 y.o. female.  Presents to the emergency department for evaluation of nausea and vomiting.  Patient reports that she has been dealing with severe thoracic pain for months.  She is under the care of orthopedics.  She reports that the pain has worsened tonight and is causing her to have nausea and vomiting.  She is now complaining of cramping abdominal pain that she thinks is secondary to vomiting so many times.       Home Medications Prior to Admission medications   Medication Sig Start Date End Date Taking? Authorizing Provider  acetaminophen (TYLENOL) 500 MG tablet Take 500-1,000 mg by mouth every 6 (six) hours as needed for moderate pain.    [provider]  albuterol (VENTOLIN HFA) 108 (90 Base) MCG/ACT inhaler Inhale 2 puffs into the lungs every 6 (six) hours as needed for wheezing or shortness of breath. 10/23/22   Gilmore Laroche, FNP  amphetamine-dextroamphetamine (ADDERALL XR) 20 MG 24 hr capsule Take 20 mg by mouth daily. 04/02/22   [provider]  amphetamine-dextroamphetamine (ADDERALL) 5 MG tablet Take 5 mg by mouth daily in the afternoon.    [provider]  azelastine (ASTELIN) 0.1 % nasal spray 2 sprays per nostril 1-2 times daily as needed. 07/18/21   [name], MD  Cholecalciferol (VITAMIN D3) 25 MCG (1000 UT) CAPS Take 1 capsule (1,000 Units total) by mouth daily. 08/27/21   [name], FNP  FLUoxetine (PROZAC) 10 MG capsule Take 10 mg by mouth daily. 10/22/22   [provider]  fluticasone (FLOVENT HFA) 110 MCG/ACT inhaler Inhale 2 puffs into the lungs 2 (two) times daily. With spacer.  07/18/21   [name], MD  Galcanezumab-gnlm [facility]) 120 MG/ML SOAJ Inject 1 pen  into the skin every 30 (thirty) days. 06/12/22   Lomax, Amy, NP  ibuprofen (ADVIL) 200 MG tablet Take 400 mg by mouth every 6 (six) hours as needed for moderate pain.    [provider]  levocetirizine (XYZAL) 5 MG tablet Take 1 tablet (5 mg total) by mouth daily. 09/23/22 12/22/22  Gilmore Laroche, FNP  levothyroxine (SYNTHROID) 125 MCG tablet Take 1 tablet (125 mcg total) by mouth daily. 09/24/22   Gilmore Laroche, FNP  olmesartan-hydrochlorothiazide (BENICAR HCT) 20-12.5 MG tablet TAKE 1 TABLET BY MOUTH DAILY 11/25/22   Gilmore Laroche, FNP  potassium chloride SA (KLOR-CON M) 20 MEQ tablet Take 1 tablet (20 mEq total) by mouth 3 (three) times daily. 07/26/22   Lazaro Arms, MD  rizatriptan (MAXALT-MLT) 10 MG disintegrating tablet DISSOLVE ONE TABLET BY MOUTH AS NEEDED FOR MIGRAINE. MAY REPEAT IN 2 HOURS IF NEEDED. NO MORE THAN 2 TABLETS DAILY OR 10 TABLETS A MONTH. 06/12/22   Lomax, Amy, NP  tiZANidine (ZANAFLEX) 4 MG tablet Take 1 tablet (4 mg total) by mouth at bedtime as needed for muscle spasms. 09/23/22   Gilmore Laroche, FNP  triamcinolone (NASACORT) 55 MCG/ACT AERO nasal inhaler Place 1 spray into the nose daily. 07/18/21   [name], MD      Allergies    Poison ivy extract, Poison oak extract, Anser anser feather (  goose) allergy skin test, Flexeril [cyclobenzaprine], Lactose intolerance (gi), Honeysuckle flower [lonicera], and Mixed grasses    Review of Systems   Review of Systems  Physical Exam Updated Vital Signs BP (!) 134/93 (BP Location: Left Arm)   Pulse 77   Temp (!) 97.4 F (36.3 C) (Oral)   Resp (!) 25   Ht 5\' 6"  (1.676 m)   Wt 127 kg   LMP 03/10/2022 (Approximate)   SpO2 100%   BMI 45.19 kg/m  Physical Exam Vitals and nursing note reviewed.  Constitutional:      General: She is not in acute distress.    Appearance: She is well-developed.  HENT:     Head:  Normocephalic and atraumatic.     Mouth/Throat:     Mouth: Mucous membranes are moist.  Eyes:     General: Vision grossly intact. Gaze aligned appropriately.     Extraocular Movements: Extraocular movements intact.     Conjunctiva/sclera: Conjunctivae normal.  Cardiovascular:     Rate and Rhythm: Normal rate and regular rhythm.     Pulses: Normal pulses.     Heart sounds: Normal heart sounds, S1 normal and S2 normal. No murmur heard.    No friction rub. No gallop.  Pulmonary:     Effort: Pulmonary effort is normal. No respiratory distress.     Breath sounds: Normal breath sounds.  Abdominal:     General: Bowel sounds are normal.     Palpations: Abdomen is soft.     Tenderness: There is no abdominal tenderness. There is no guarding or rebound.     Hernia: No hernia is present.  Musculoskeletal:        General: No swelling.     Cervical back: Full passive range of motion without pain, normal range of motion and neck supple. No spinous process tenderness or muscular tenderness. Normal range of motion.     Right lower leg: No edema.     Left lower leg: No edema.  Skin:    General: Skin is warm and dry.     Capillary Refill: Capillary refill takes less than 2 seconds.     Findings: No ecchymosis, erythema, rash or wound.  Neurological:     General: No focal deficit present.     Mental Status: She is alert and oriented to person, place, and time.     GCS: GCS eye subscore is 4. GCS verbal subscore is 5. GCS motor subscore is 6.     Cranial Nerves: Cranial nerves 2-12 are intact.     Sensory: Sensation is intact.     Motor: Motor function is intact.     Coordination: Coordination is intact.  Psychiatric:        Attention and Perception: Attention normal.        Mood and Affect: Mood normal.        Speech: Speech normal.        Behavior: Behavior normal.     ED Results / Procedures / Treatments   Labs (all labs ordered are listed, but only abnormal results are displayed) Labs  Reviewed  CBC WITH DIFFERENTIAL/PLATELET  COMPREHENSIVE METABOLIC PANEL  LIPASE, BLOOD  HCG, SERUM, QUALITATIVE  URINALYSIS, ROUTINE W REFLEX MICROSCOPIC    EKG None  Radiology No results found.  Procedures Procedures  {Document cardiac monitor, telemetry assessment procedure when appropriate:1}  Medications Ordered in ED Medications  sodium chloride 0.9 % bolus 1,000 mL (has no administration in time range)  ondansetron (ZOFRAN) injection 4 mg (  has no administration in time range)  metoCLOPramide (REGLAN) injection 10 mg (has no administration in time range)  HYDROmorphone (DILAUDID) injection 1 mg (has no administration in time range)    ED Course/ Medical Decision Making/ A&P   {   Click here for ABCD2, HEART and other calculatorsREFRESH Note before signing :1}                              Medical Decision Making Amount and/or Complexity of Data Reviewed Labs: ordered.  Risk Prescription drug management.     {Document critical care time when appropriate:1} {Document review of labs and clinical decision tools ie heart score, Chads2Vasc2 etc:1}  {Document your independent review of radiology images, and any outside records:1} {Document your discussion with family members, caretakers, and with consultants:1} {Document social determinants of health affecting pt's care:1} {Document your decision making why or why not admission, treatments were needed:1} Final Clinical Impression(s) / ED Diagnoses Final diagnoses:  None    Rx / DC Orders ED Discharge Orders     None

## 2023-01-20 NOTE — ED Triage Notes (Signed)
Pt with c/o "Thoracic nerve pain" that started yesterday morning, Emesis x 2, abdominal and chest pain.

## 2023-01-21 ENCOUNTER — Telehealth: Payer: Self-pay

## 2023-01-21 NOTE — Transitions of Care (Post Inpatient/ED Visit) (Signed)
01/21/2023  Name: Kristin Baker MRN: [id number] DOB: 03-May-1991  Today's TOC FU Call Status: Today's TOC FU Call Status:: Successful TOC FU Call Completed TOC FU Call Complete Date: 01/21/23 Patient's Name and Date of Birth confirmed.  Transition Care Management Follow-up Telephone Call Date of Discharge: 01/20/23 [facility] Type of Discharge: Emergency Department Reason for ED Visit: Other: (Nausea and vomiting, unspecified vomiting type) How have you been since you were released from the hospital?: Better Any questions or concerns?: No  Items Reviewed: Did you receive and understand the discharge instructions provided?: Yes Medications obtained,verified, and reconciled?: Yes (Medications Reviewed) Any new allergies since your discharge?: No Dietary orders reviewed?: Yes Do you have support at home?: Yes  Medications Reviewed Today: Medications Reviewed Today     Reviewed by Merleen Nicely, LPN (Licensed Practical Nurse) on 01/21/23 at 1142  Med List Status: <None>   Medication Order Taking? Sig Documenting Provider Last Dose Status Informant  acetaminophen (TYLENOL) 500 MG tablet [id number] Yes Take 500-1,000 mg by mouth every 6 (six) hours as needed for moderate pain. [provider] Taking Active Self  albuterol (VENTOLIN HFA) 108 (90 Base) MCG/ACT inhaler [id number] Yes Inhale 2 puffs into the lungs every 6 (six) hours as needed for wheezing or shortness of breath. Gilmore Laroche, FNP Taking Active   amphetamine-dextroamphetamine (ADDERALL XR) 20 MG 24 hr capsule [id number] Yes Take 20 mg by mouth daily. [provider] Taking Active Self  amphetamine-dextroamphetamine (ADDERALL) 5 MG tablet [id number] Yes Take 5 mg by mouth daily in the afternoon. [provider] Taking Active Self  azelastine (ASTELIN) 0.1 % nasal spray [id number] Yes 2 sprays per nostril 1-2 times daily as needed. [name], MD Taking Active  Self  Cholecalciferol (VITAMIN D3) 25 MCG (1000 UT) CAPS [id number] Yes Take 1 capsule (1,000 Units total) by mouth daily. [name], FNP Taking Active Self           Med Note Joella Prince A   Tue Jul 23, 2022 12:20 PM)    FLUoxetine (PROZAC) 10 MG capsule [id number] Yes Take 10 mg by mouth daily. [provider] Taking Active   fluticasone (FLOVENT HFA) 110 MCG/ACT inhaler [id number] Yes Inhale 2 puffs into the lungs 2 (two) times daily. With spacer. [name], MD Taking Active Self  Galcanezumab-gnlm [facility]) 120 MG/ML Ivory Broad [id number] Yes Inject 1 pen  into the skin every 30 (thirty) days. Lomax, Amy, NP Taking Active Self  ibuprofen (ADVIL) 200 MG tablet [id number] Yes Take 400 mg by mouth every 6 (six) hours as needed for moderate pain. [provider] Taking Active Self  levocetirizine (XYZAL) 5 MG tablet [id number] Yes Take 1 tablet (5 mg total) by mouth daily. Gilmore Laroche, FNP Taking Active   levothyroxine (SYNTHROID) 125 MCG tablet [id number] Yes Take 1 tablet (125 mcg total) by mouth daily. Gilmore Laroche, FNP Taking Active   olmesartan-hydrochlorothiazide [facility] HCT) 20-12.5 MG tablet [id number] Yes TAKE 1 TABLET BY MOUTH DAILY Gilmore Laroche, FNP Taking Active   potassium chloride SA (KLOR-CON M) 20 MEQ tablet [id number] Yes Take 1 tablet (20 mEq total) by mouth 3 (three) times daily. Lazaro Arms, MD Taking Active   rizatriptan (MAXALT-MLT) 10 MG disintegrating tablet [id number] Yes DISSOLVE ONE TABLET BY MOUTH AS NEEDED FOR MIGRAINE. MAY REPEAT IN 2 HOURS IF NEEDED. NO MORE THAN 2 TABLETS DAILY OR 10 TABLETS A MONTH. Lomax, Amy, NP Taking Active Self  tiZANidine (ZANAFLEX) 4 MG tablet  [id number] Yes Take 1 tablet (4 mg total) by mouth at bedtime as needed for muscle spasms. Gilmore Laroche, FNP Taking Active   triamcinolone (NASACORT) 55 MCG/ACT AERO nasal inhaler [id number] Yes Place 1 spray into the nose daily. [name], MD Taking  Active Self            Home Care and Equipment/Supplies: Were Home Health Services Ordered?: NA Any new equipment or medical supplies ordered?: NA  Functional Questionnaire: Do you need assistance with bathing/showering or dressing?: No Do you need assistance with meal preparation?: No Do you need assistance with eating?: No Do you have difficulty maintaining continence: No Do you need assistance with getting out of bed/getting out of a chair/moving?: No Do you have difficulty managing or taking your medications?: No  Follow up appointments reviewed: PCP Follow-up appointment confirmed?: Yes Date of PCP follow-up appointment?: 01/23/23 Follow-up Provider: Gilmore Laroche FNP Special[facility] Follow-up appointment confirmed?: NA Do you need transportation to your follow-up appointment?: No Do you understand care options if your condition(s) worsen?: Yes-patient verbalized understanding    SIGNATURE  Woodfin Ganja LPN [facility] Nurse Health Advisor Direct Dial [phone]

## 2023-01-22 ENCOUNTER — Ambulatory Visit (INDEPENDENT_AMBULATORY_CARE_PROVIDER_SITE_OTHER): Payer: Medicaid Other | Admitting: Gastroenterology

## 2023-01-22 ENCOUNTER — Encounter (INDEPENDENT_AMBULATORY_CARE_PROVIDER_SITE_OTHER): Payer: Self-pay | Admitting: Gastroenterology

## 2023-01-22 VITALS — BP 114/79 | HR 77 | Temp 97.9°F | Ht 66.0 in | Wt 291.8 lb

## 2023-01-22 DIAGNOSIS — D500 Iron deficiency anemia secondary to blood loss (chronic): Secondary | ICD-10-CM

## 2023-01-22 DIAGNOSIS — R1114 Bilious vomiting: Secondary | ICD-10-CM | POA: Diagnosis not present

## 2023-01-22 DIAGNOSIS — K5904 Chronic idiopathic constipation: Secondary | ICD-10-CM

## 2023-01-22 DIAGNOSIS — R195 Other fecal abnormalities: Secondary | ICD-10-CM | POA: Insufficient documentation

## 2023-01-22 DIAGNOSIS — R10826 Epigastric rebound abdominal tenderness: Secondary | ICD-10-CM

## 2023-01-22 DIAGNOSIS — Z6841 Body Mass Index (BMI) 40.0 and over, adult: Secondary | ICD-10-CM | POA: Diagnosis not present

## 2023-01-22 MED ORDER — PEG 3350-KCL-NA BICARB-NACL 420 G PO SOLR: 4000.0000 mL | Freq: Once | ORAL | 0 refills | Status: AC

## 2023-01-22 MED ORDER — PSYLLIUM 58.6 % PO PACK: 1.0000 | PACK | Freq: Two times a day (BID) | ORAL | 2 refills | Status: AC

## 2023-01-22 NOTE — Patient Instructions (Signed)
It was very nice to meet you today, as dicussed with will plan for the following :  1) Upper endoscopy and Colonoscopy 2) Labwork 3) Ensure adequate fluid intake: Aim for 8 glasses of water daily. Follow a high fiber diet: Include foods such as dates, prunes, pears, and kiwi. Use Metamucil twice a day.

## 2023-01-22 NOTE — H&P (View-Only) (Signed)
Kristin Baker , M.D. Gastroenterology & Hepatology [facility] [address] [place], [place] [address] Primary Care Physician: Gilmore Laroche, FNP [address] [place] [address]  Chief Complaint: Positive FOBT, iron deficiency, nausea and vomiting  History of Present Illness: Kristin Baker is a 32 y.o. female with hypertension, hypothyroidism, ADHD, iron deficiency anemia who presents for evaluation of positive fecal occult blood test, iron deficiency, abdominal pain with nausea and vomiting.  Patient reports that previously she used to have menorrhagia for which she underwent hysterectomy since then her hemoglobin has improved.  Patient is on iron infusion but ferritin remains low.  She had family history of multiple family members with breast and uterine cancer.   Patient reports that she does not notice occasional red blood upon wiping, has hard stools with much straining.  She tries to have high-fiber diet but continues to have constipation alternating with diarrhea.  Certain foods exacerbate abdominal pain causing nausea and vomiting such as coffee spicy food fatty food  Patient has chronic pain and does take occasionally NSAIDs  Last HYQ:MVHQ Last Colonoscopy:none  FHx: Mother had endometrial cancer Maternal aunt with breast cancer Maternal aunt with ovarian cancer and breast cancer Maternal grandmother with breast cancer and uterine cancer. Social: current [employer], No alcohol or illicit drug use Surgical: Hysterectomy  Past Medical History: Past Medical History:  Diagnosis Date   Abdominal pain    ADHD    ANA positive    Back pain    DDD (degenerative disc disease), lumbar    Eye pain, right    Fatigue    Frontal sinusitis    Hypertension    Hypothyroidism    Iron deficiency anemia due to chronic blood loss 06/19/2022   Medical history non-contributory    Obesity, morbid (HCC)    Palpitations     Rheumatoid arthritis (HCC)     Past Surgical History: Past Surgical History:  Procedure Laterality Date   ABLATION     05/22/2020 left side, 06/14/2020 right side    HYSTERECTOMY ABDOMINAL WITH SALPINGECTOMY     ROBOTIC ASSISTED TOTAL HYSTERECTOMY Bilateral 07/30/2022   Procedure: XI ROBOTIC ASSISTED TOTAL HYSTERECTOMY AND BILATERAL SALPINGECTOMY;  Surgeon: Lazaro Arms, MD;  Location: [facility];  Service: Gynecology;  Laterality: Bilateral;    Family History: Family History  Problem Relation Age of Onset   Cervical cancer Maternal Grandmother    Breast cancer Maternal Grandmother    Skin cancer Maternal Grandmother    Heart attack Maternal Grandfather    Diabetes Father    Heart attack Father    Hyperlipidemia Mother    Peripheral Artery Disease Mother    ADD / ADHD Mother    Bipolar disorder Mother    Cervical cancer Mother        at age 34.   ADD / ADHD Sister    Healthy Daughter    Breast cancer Maternal Aunt    Cervical cancer Maternal Aunt    ADD / ADHD Maternal Aunt    Uterine cancer Maternal Aunt    Breast cancer Maternal Aunt    Multiple sclerosis Paternal Aunt    Colon cancer Neg Hx     Social History: Social History   Tobacco Use  Smoking Status Every Day   Current packs/day: 0.00   Average packs/day: 1 pack/day for 15.0 years (15.0 ttl pk-yrs)   Types: Cigarettes, E-cigarettes   Start date: 11/24/2005   Last attempt to quit: 11/24/2020  Years since quitting: 2.1   Passive exposure: Current  Smokeless Tobacco Never  Tobacco Comments   She has quit of and on.   Social History   Substance and Sexual Activity  Alcohol Use Yes   Comment: occasionally /social events   Social History   Substance and Sexual Activity  Drug Use Not Currently   Comment: smoked weed during teen years    Allergies: Allergies  Allergen Reactions   Poison Ivy Extract Anaphylaxis   Poison Oak Extract Anaphylaxis   Anser Anser Feather (Goose) Allergy Skin Test    Flexeril  [Cyclobenzaprine] Hives   Lactose Intolerance (Gi) Diarrhea   Honeysuckle Flower [Lonicera] Rash   Mixed Grasses Rash    Medications: Current Outpatient Medications  Medication Sig Dispense Refill   acetaminophen (TYLENOL) 500 MG tablet Take 500-1,000 mg by mouth every 6 (six) hours as needed for moderate pain.     albuterol (VENTOLIN HFA) 108 (90 Base) MCG/ACT inhaler Inhale 2 puffs into the lungs every 6 (six) hours as needed for wheezing or shortness of breath. 18 g 1   amphetamine-dextroamphetamine (ADDERALL XR) 20 MG 24 hr capsule Take 20 mg by mouth daily.     amphetamine-dextroamphetamine (ADDERALL) 5 MG tablet Take 5 mg by mouth daily in the afternoon.     azelastine (ASTELIN) 0.1 % nasal spray 2 sprays per nostril 1-2 times daily as needed. 30 mL 5   Cholecalciferol (VITAMIN D3) 25 MCG (1000 UT) CAPS Take 1 capsule (1,000 Units total) by mouth daily. 30 capsule 3   FLUoxetine (PROZAC) 10 MG capsule Take 10 mg by mouth daily.     fluticasone (FLOVENT HFA) 110 MCG/ACT inhaler Inhale 2 puffs into the lungs 2 (two) times daily. With spacer. 1 each 5   Galcanezumab-gnlm (EMGALITY) 120 MG/ML SOAJ Inject 1 pen  into the skin every 30 (thirty) days. 3 mL 3   ibuprofen (ADVIL) 200 MG tablet Take 400 mg by mouth every 6 (six) hours as needed for moderate pain.     levocetirizine (XYZAL) 5 MG tablet Take 1 tablet (5 mg total) by mouth daily. 30 tablet 2   levothyroxine (SYNTHROID) 125 MCG tablet Take 1 tablet (125 mcg total) by mouth daily. 90 tablet 3   olmesartan-hydrochlorothiazide (BENICAR HCT) 20-12.5 MG tablet TAKE 1 TABLET BY MOUTH DAILY 90 tablet 0   rizatriptan (MAXALT-MLT) 10 MG disintegrating tablet DISSOLVE ONE TABLET BY MOUTH AS NEEDED FOR MIGRAINE. MAY REPEAT IN 2 HOURS IF NEEDED. NO MORE THAN 2 TABLETS DAILY OR 10 TABLETS A MONTH. 10 tablet 5   tiZANidine (ZANAFLEX) 4 MG tablet Take 1 tablet (4 mg total) by mouth at bedtime as needed for muscle spasms. 30 tablet 0   triamcinolone  (NASACORT) 55 MCG/ACT AERO nasal inhaler Place 1 spray into the nose daily. 16.9 mL 5   No current facility-administered medications for this visit.    Review of Systems: GENERAL: negative for malaise, night sweats HEENT: No changes in hearing or vision, no nose bleeds or other nasal problems. NECK: Negative for lumps, goiter, pain and significant neck swelling RESPIRATORY: Negative for cough, wheezing CARDIOVASCULAR: Negative for chest pain, leg swelling, palpitations, orthopnea GI: SEE HPI MUSCULOSKELETAL: Negative for joint pain or swelling, back pain, and muscle pain. SKIN: Negative for lesions, rash HEMATOLOGY Negative for prolonged bleeding, bruising easily, and swollen nodes. ENDOCRINE: Negative for cold or heat intolerance, polyuria, polydipsia and goiter. NEURO: negative for tremor, gait imbalance, syncope and seizures. The remainder of the review of systems  is noncontributory.   Physical Exam: BP 114/79 (BP Location: Left Arm, Patient Position: Sitting, Cuff Size: Large)   Pulse 77   Temp 97.9 F (36.6 C) (Oral)   Ht 5\' 6"  (1.676 m)   Wt 291 lb 12.8 oz (132.4 kg)   LMP 03/10/2022 (Approximate)   BMI 47.10 kg/m  GENERAL: The patient is AO x3, in no acute distress. HEENT: Head is normocephalic and atraumatic. EOMI are intact. Mouth is well hydrated and without lesions. NECK: Supple. No masses LUNGS: Clear to auscultation. No presence of rhonchi/wheezing/rales. Adequate chest expansion HEART: RRR, normal s1 and s2. ABDOMEN: Soft, nontender, no guarding, no peritoneal signs, and nondistended. BS +. No masses. Chaperone: Toniann Fail , RN EXTREMITIES: Without any cyanosis, clubbing, rash, lesions or edema. NEUROLOGIC: AOx3, no focal motor deficit. SKIN: no jaundice, no rashes   Imaging/Labs: as above  Most recent labs (12/06/2022): Hgb 13.6/MCV 87.9, ferritin 32, iron saturation 14%  Hemoglobin was as low as 7.7 in December 2023 received IV iron infusions  Potassium 3.4.   Normal liver enzymes 3.3 T40.7  Positive reports I personally reviewed and interpreted the available labs, imaging and endoscopic files.  Impression and Plan:  Shawneequa Victoria is a 32 y.o. female with hypertension, hypothyroidism, ADHD, iron deficiency anemia who presents for evaluation of positive fecal occult blood test, iron deficiency, abdominal pain with nausea and vomiting.  #positive fecal occult blood test,  #iron deficiency #Painless hematochezia  Patient has a positive fecal occult blood test with reports of painless hematochezia on wiping.  At this time would need to rule out inflammatory bowel disease with colonoscopy and terminal ileum intubation.  Although given underlying constipation and straining this could be hemorrhoidal bleed  As per ACG guidelines , bidirectional endoscopy is recommended over iron replacement therapy only for patient with deficient anemia last ferritin 32  Also patient has strong family history of ovarian uterine and breast cancer she herself is status post hysterectomy is not but no genetic counseling is found in the chart  We will proceed along with Upper endoscopy with small bowel biopsies and Colonoscopy   Will also send celiac panel  #Epigastric tenderness  On exam today patient has significant epigastric tenderness, with postprandial epigastric pain nausea and vomiting and history of NSAID use this could be peptic ulcer disease  Will proceed with upper endoscopy with random gastric biopsies to evaluate for H. pylori as well  #Constipation  Patient has occasional hard stools with much straining  Ensure adequate fluid intake: Aim for 8 glasses of water daily. Follow a high fiber diet: Include foods such as dates, prunes, pears, and kiwi. Use Metamucil twice a day.   # Hypokalemia  Potassium 3.4 Advised patient to have food high in potassium such as bananas and avocados and coconut water  #BMI 47      - walking at a brisk  pace/biking at moderate intesity 2.5-5 hours per week     - use pedometer/step counter to track activity     - goal to lose 5-10% of initial body weight     - avoid suagry drinks and juices, use zero calorie beverages     - increase water intake     - eat a low carb diet with plenty of veggies and fruit     - Get sufficient sleep 7-8 hrs nightly     - maitain active lifestyle     - avoid alcohol     - recommend 2-3 cups Coffee daily     -  Counsel on lowering cholesterol by having a diet rich in vegetables,          protein (avoid red meats) and good fats(fish, salmon).     All questions were answered.      Kristin Lawman, MD Gastroenterology and Hepatology [facility]   This chart has been completed using [facility] Dictation software, and while attempts have been made to ensure accuracy , certain words and phrases may not be transcribed as intended

## 2023-01-22 NOTE — Progress Notes (Signed)
Vista Lawman , M.D. Gastroenterology & Hepatology [facility] [address] [place], [place] [address] Primary Care Physician: Gilmore Laroche, FNP [address] [place] [address]  Chief Complaint: Positive FOBT, iron deficiency, nausea and vomiting  History of Present Illness: Kristin Baker is a 32 y.o. female with hypertension, hypothyroidism, ADHD, iron deficiency anemia who presents for evaluation of positive fecal occult blood test, iron deficiency, abdominal pain with nausea and vomiting.  Patient reports that previously she used to have menorrhagia for which she underwent hysterectomy since then her hemoglobin has improved.  Patient is on iron infusion but ferritin remains low.  She had family history of multiple family members with breast and uterine cancer.   Patient reports that she does not notice occasional red blood upon wiping, has hard stools with much straining.  She tries to have high-fiber diet but continues to have constipation alternating with diarrhea.  Certain foods exacerbate abdominal pain causing nausea and vomiting such as coffee spicy food fatty food  Patient has chronic pain and does take occasionally NSAIDs  Last ION:GEXB Last Colonoscopy:none  FHx: Mother had endometrial cancer Maternal aunt with breast cancer Maternal aunt with ovarian cancer and breast cancer Maternal grandmother with breast cancer and uterine cancer. Social: current [employer], No alcohol or illicit drug use Surgical: Hysterectomy  Past Medical History: Past Medical History:  Diagnosis Date   Abdominal pain    ADHD    ANA positive    Back pain    DDD (degenerative disc disease), lumbar    Eye pain, right    Fatigue    Frontal sinusitis    Hypertension    Hypothyroidism    Iron deficiency anemia due to chronic blood loss 06/19/2022   Medical history non-contributory    Obesity, morbid (HCC)    Palpitations     Rheumatoid arthritis (HCC)     Past Surgical History: Past Surgical History:  Procedure Laterality Date   ABLATION     05/22/2020 left side, 06/14/2020 right side    HYSTERECTOMY ABDOMINAL WITH SALPINGECTOMY     ROBOTIC ASSISTED TOTAL HYSTERECTOMY Bilateral 07/30/2022   Procedure: XI ROBOTIC ASSISTED TOTAL HYSTERECTOMY AND BILATERAL SALPINGECTOMY;  Surgeon: Lazaro Arms, MD;  Location: [facility];  Service: Gynecology;  Laterality: Bilateral;    Family History: Family History  Problem Relation Age of Onset   Cervical cancer Maternal Grandmother    Breast cancer Maternal Grandmother    Skin cancer Maternal Grandmother    Heart attack Maternal Grandfather    Diabetes Father    Heart attack Father    Hyperlipidemia Mother    Peripheral Artery Disease Mother    ADD / ADHD Mother    Bipolar disorder Mother    Cervical cancer Mother        at age 88.   ADD / ADHD Sister    Healthy Daughter    Breast cancer Maternal Aunt    Cervical cancer Maternal Aunt    ADD / ADHD Maternal Aunt    Uterine cancer Maternal Aunt    Breast cancer Maternal Aunt    Multiple sclerosis Paternal Aunt    Colon cancer Neg Hx     Social History: Social History   Tobacco Use  Smoking Status Every Day   Current packs/day: 0.00   Average packs/day: 1 pack/day for 15.0 years (15.0 ttl pk-yrs)   Types: Cigarettes, E-cigarettes   Start date: 11/24/2005   Last attempt to quit: 11/24/2020  Years since quitting: 2.1   Passive exposure: Current  Smokeless Tobacco Never  Tobacco Comments   She has quit of and on.   Social History   Substance and Sexual Activity  Alcohol Use Yes   Comment: occasionally /social events   Social History   Substance and Sexual Activity  Drug Use Not Currently   Comment: smoked weed during teen years    Allergies: Allergies  Allergen Reactions   Poison Ivy Extract Anaphylaxis   Poison Oak Extract Anaphylaxis   Anser Anser Feather (Goose) Allergy Skin Test    Flexeril  [Cyclobenzaprine] Hives   Lactose Intolerance (Gi) Diarrhea   Honeysuckle Flower [Lonicera] Rash   Mixed Grasses Rash    Medications: Current Outpatient Medications  Medication Sig Dispense Refill   acetaminophen (TYLENOL) 500 MG tablet Take 500-1,000 mg by mouth every 6 (six) hours as needed for moderate pain.     albuterol (VENTOLIN HFA) 108 (90 Base) MCG/ACT inhaler Inhale 2 puffs into the lungs every 6 (six) hours as needed for wheezing or shortness of breath. 18 g 1   amphetamine-dextroamphetamine (ADDERALL XR) 20 MG 24 hr capsule Take 20 mg by mouth daily.     amphetamine-dextroamphetamine (ADDERALL) 5 MG tablet Take 5 mg by mouth daily in the afternoon.     azelastine (ASTELIN) 0.1 % nasal spray 2 sprays per nostril 1-2 times daily as needed. 30 mL 5   Cholecalciferol (VITAMIN D3) 25 MCG (1000 UT) CAPS Take 1 capsule (1,000 Units total) by mouth daily. 30 capsule 3   FLUoxetine (PROZAC) 10 MG capsule Take 10 mg by mouth daily.     fluticasone (FLOVENT HFA) 110 MCG/ACT inhaler Inhale 2 puffs into the lungs 2 (two) times daily. With spacer. 1 each 5   Galcanezumab-gnlm (EMGALITY) 120 MG/ML SOAJ Inject 1 pen  into the skin every 30 (thirty) days. 3 mL 3   ibuprofen (ADVIL) 200 MG tablet Take 400 mg by mouth every 6 (six) hours as needed for moderate pain.     levocetirizine (XYZAL) 5 MG tablet Take 1 tablet (5 mg total) by mouth daily. 30 tablet 2   levothyroxine (SYNTHROID) 125 MCG tablet Take 1 tablet (125 mcg total) by mouth daily. 90 tablet 3   olmesartan-hydrochlorothiazide (BENICAR HCT) 20-12.5 MG tablet TAKE 1 TABLET BY MOUTH DAILY 90 tablet 0   rizatriptan (MAXALT-MLT) 10 MG disintegrating tablet DISSOLVE ONE TABLET BY MOUTH AS NEEDED FOR MIGRAINE. MAY REPEAT IN 2 HOURS IF NEEDED. NO MORE THAN 2 TABLETS DAILY OR 10 TABLETS A MONTH. 10 tablet 5   tiZANidine (ZANAFLEX) 4 MG tablet Take 1 tablet (4 mg total) by mouth at bedtime as needed for muscle spasms. 30 tablet 0   triamcinolone  (NASACORT) 55 MCG/ACT AERO nasal inhaler Place 1 spray into the nose daily. 16.9 mL 5   No current facility-administered medications for this visit.    Review of Systems: GENERAL: negative for malaise, night sweats HEENT: No changes in hearing or vision, no nose bleeds or other nasal problems. NECK: Negative for lumps, goiter, pain and significant neck swelling RESPIRATORY: Negative for cough, wheezing CARDIOVASCULAR: Negative for chest pain, leg swelling, palpitations, orthopnea GI: SEE HPI MUSCULOSKELETAL: Negative for joint pain or swelling, back pain, and muscle pain. SKIN: Negative for lesions, rash HEMATOLOGY Negative for prolonged bleeding, bruising easily, and swollen nodes. ENDOCRINE: Negative for cold or heat intolerance, polyuria, polydipsia and goiter. NEURO: negative for tremor, gait imbalance, syncope and seizures. The remainder of the review of systems  is noncontributory.   Physical Exam: BP 114/79 (BP Location: Left Arm, Patient Position: Sitting, Cuff Size: Large)   Pulse 77   Temp 97.9 F (36.6 C) (Oral)   Ht 5\' 6"  (1.676 m)   Wt 291 lb 12.8 oz (132.4 kg)   LMP 03/10/2022 (Approximate)   BMI 47.10 kg/m  GENERAL: The patient is AO x3, in no acute distress. HEENT: Head is normocephalic and atraumatic. EOMI are intact. Mouth is well hydrated and without lesions. NECK: Supple. No masses LUNGS: Clear to auscultation. No presence of rhonchi/wheezing/rales. Adequate chest expansion HEART: RRR, normal s1 and s2. ABDOMEN: Soft, nontender, no guarding, no peritoneal signs, and nondistended. BS +. No masses. Chaperone: Toniann Fail , RN EXTREMITIES: Without any cyanosis, clubbing, rash, lesions or edema. NEUROLOGIC: AOx3, no focal motor deficit. SKIN: no jaundice, no rashes   Imaging/Labs: as above  Most recent labs (12/06/2022): Hgb 13.6/MCV 87.9, ferritin 32, iron saturation 14%  Hemoglobin was as low as 7.7 in December 2023 received IV iron infusions  Potassium 3.4.   Normal liver enzymes 3.3 T40.7  Positive reports I personally reviewed and interpreted the available labs, imaging and endoscopic files.  Impression and Plan:  Kristin Baker is a 32 y.o. female with hypertension, hypothyroidism, ADHD, iron deficiency anemia who presents for evaluation of positive fecal occult blood test, iron deficiency, abdominal pain with nausea and vomiting.  #positive fecal occult blood test,  #iron deficiency #Painless hematochezia  Patient has a positive fecal occult blood test with reports of painless hematochezia on wiping.  At this time would need to rule out inflammatory bowel disease with colonoscopy and terminal ileum intubation.  Although given underlying constipation and straining this could be hemorrhoidal bleed  As per ACG guidelines , bidirectional endoscopy is recommended over iron replacement therapy only for patient with deficient anemia last ferritin 32  Also patient has strong family history of ovarian uterine and breast cancer she herself is status post hysterectomy is not but no genetic counseling is found in the chart  We will proceed along with Upper endoscopy with small bowel biopsies and Colonoscopy   Will also send celiac panel  #Epigastric tenderness  On exam today patient has significant epigastric tenderness, with postprandial epigastric pain nausea and vomiting and history of NSAID use this could be peptic ulcer disease  Will proceed with upper endoscopy with random gastric biopsies to evaluate for H. pylori as well  #Constipation  Patient has occasional hard stools with much straining  Ensure adequate fluid intake: Aim for 8 glasses of water daily. Follow a high fiber diet: Include foods such as dates, prunes, pears, and kiwi. Use Metamucil twice a day.   # Hypokalemia  Potassium 3.4 Advised patient to have food high in potassium such as bananas and avocados and coconut water  #BMI 47      - walking at a brisk  pace/biking at moderate intesity 2.5-5 hours per week     - use pedometer/step counter to track activity     - goal to lose 5-10% of initial body weight     - avoid suagry drinks and juices, use zero calorie beverages     - increase water intake     - eat a low carb diet with plenty of veggies and fruit     - Get sufficient sleep 7-8 hrs nightly     - maitain active lifestyle     - avoid alcohol     - recommend 2-3 cups Coffee daily     -  Counsel on lowering cholesterol by having a diet rich in vegetables,          protein (avoid red meats) and good fats(fish, salmon).     All questions were answered.      Vista Lawman, MD Gastroenterology and Hepatology [facility]   This chart has been completed using [facility] Dictation software, and while attempts have been made to ensure accuracy , certain words and phrases may not be transcribed as intended

## 2023-01-23 ENCOUNTER — Encounter: Payer: Self-pay | Admitting: Family Medicine

## 2023-01-23 ENCOUNTER — Telehealth (INDEPENDENT_AMBULATORY_CARE_PROVIDER_SITE_OTHER): Payer: Self-pay | Admitting: Gastroenterology

## 2023-01-23 ENCOUNTER — Ambulatory Visit (INDEPENDENT_AMBULATORY_CARE_PROVIDER_SITE_OTHER): Payer: Medicaid Other | Admitting: Family Medicine

## 2023-01-23 VITALS — BP 116/74 | HR 98 | Ht 67.0 in | Wt 283.1 lb

## 2023-01-23 DIAGNOSIS — E038 Other specified hypothyroidism: Secondary | ICD-10-CM

## 2023-01-23 DIAGNOSIS — R232 Flushing: Secondary | ICD-10-CM

## 2023-01-23 DIAGNOSIS — R112 Nausea with vomiting, unspecified: Secondary | ICD-10-CM

## 2023-01-23 DIAGNOSIS — R5383 Other fatigue: Secondary | ICD-10-CM | POA: Diagnosis not present

## 2023-01-23 NOTE — Progress Notes (Signed)
Established Patient Office Visit  Subjective:  Patient ID: Kristin Baker, female    DOB: [birth year]  Age: 32 y.o. MRN: [id number]  CC:  Chief Complaint  Patient presents with   Follow-up    Pt f/u from ER 01/20/23 visit.  Reports feeling well, pain is a 4 out of 10.     HPI Kristin Baker is a 32 y.o. female presents for emergency department follow-up for nausea and vomiting. The patient reports feeling well since discharge from the ED, with no symptoms of nausea or vomiting. She does, however, experience fatigue and hot flashes and requests an evaluation of her hormone levels.    Past Medical History:  Diagnosis Date   Abdominal pain    ADHD    ANA positive    Back pain    DDD (degenerative disc disease), lumbar    Eye pain, right    Fatigue    Frontal sinusitis    Hypertension    Hypothyroidism    Iron deficiency anemia due to chronic blood loss 06/19/2022   Medical history non-contributory    Obesity, morbid (HCC)    Palpitations    Rheumatoid arthritis (HCC)     Past Surgical History:  Procedure Laterality Date   ABLATION     05/22/2020 left side, 06/14/2020 right side    HYSTERECTOMY ABDOMINAL WITH SALPINGECTOMY     ROBOTIC ASSISTED TOTAL HYSTERECTOMY Bilateral 07/30/2022   Procedure: XI ROBOTIC ASSISTED TOTAL HYSTERECTOMY AND BILATERAL SALPINGECTOMY;  Surgeon: Lazaro Arms, MD;  Location: [facility];  Service: Gynecology;  Laterality: Bilateral;    Family History  Problem Relation Age of Onset   Cervical cancer Maternal Grandmother    Breast cancer Maternal Grandmother    Skin cancer Maternal Grandmother    Heart attack Maternal Grandfather    Diabetes Father    Heart attack Father    Hyperlipidemia Mother    Peripheral Artery Disease Mother    ADD / ADHD Mother    Bipolar disorder Mother    Cervical cancer Mother        at age 28.   ADD / ADHD Sister    Healthy Daughter    Breast cancer Maternal Aunt    Cervical cancer Maternal Aunt    ADD /  ADHD Maternal Aunt    Uterine cancer Maternal Aunt    Breast cancer Maternal Aunt    Multiple sclerosis Paternal Aunt    Colon cancer Neg Hx     Social History   Socioeconomic History   Marital status: Married    Spouse name: Not on file   Number of children: 1   Years of education: Not on file   Highest education level: Associate degree: academic program  Occupational History   Not on file  Tobacco Use   Smoking status: Every Day    Current packs/day: 0.00    Average packs/day: 1 pack/day for 15.0 years (15.0 ttl pk-yrs)    Types: Cigarettes, E-cigarettes    Start date: 11/24/2005    Last attempt to quit: 11/24/2020    Years since quitting: 2.1    Passive exposure: Current   Smokeless tobacco: Never   Tobacco comments:    She has quit of and on.  Vaping Use   Vaping status: Every Day  Substance and Sexual Activity   Alcohol use: Yes    Comment: occasionally /social events   Drug use: Not Currently    Comment: smoked weed during teen years   Sexual activity: Not  Currently    Birth control/protection: Surgical    Comment: hyst  Other Topics Concern   Not on file  Social History Narrative   Married for May 2021.Lives with husband and daughter.Homemaker.Husband works at [employer].   Social Determinants of Health   Financial Resource Strain: Low Risk  (09/23/2022)   Overall Financial Resource Strain (CARDIA)    Difficulty of Paying Living Expenses: Not very hard  Food Insecurity: Food Insecurity Present (09/23/2022)   Hunger Vital Sign    Worried About Running Out of Food in the Last Year: Sometimes true    Ran Out of Food in the Last Year: Never true  Transportation Needs: No Transportation Needs (09/23/2022)   PRAPARE - Administrator, Civil Service (Medical): No    Lack of Transportation (Non-Medical): No  Physical Activity: Unknown (09/23/2022)   Exercise Vital Sign    Days of Exercise per Week: Patient declined    Minutes of Exercise per  Session: Not on file  Stress: Stress Concern Present (09/23/2022)   [employer] of Occupational Health - Occupational Stress Questionnaire    Feeling of Stress : Very much  Social Connections: Moderately Isolated (09/23/2022)   Social Connection and Isolation Panel [NHANES]    Frequency of Communication with Friends and Family: More than three times a week    Frequency of Social Gatherings with Friends and Family: Twice a week    Attends Religious Services: Never    Database administrator or Organizations: No    Attends Engineer, structural: Not on file    Marital Status: Married  Catering manager Violence: Not At Risk (06/19/2022)   Humiliation, Afraid, Rape, and Kick questionnaire    Fear of Current or Ex-Partner: No    Emotionally Abused: No    Physically Abused: No    Sexually Abused: No    Outpatient Medications Prior to Visit  Medication Sig Dispense Refill   acetaminophen (TYLENOL) 500 MG tablet Take 500-1,000 mg by mouth every 6 (six) hours as needed for moderate pain.     albuterol (VENTOLIN HFA) 108 (90 Base) MCG/ACT inhaler Inhale 2 puffs into the lungs every 6 (six) hours as needed for wheezing or shortness of breath. 18 g 1   amphetamine-dextroamphetamine (ADDERALL XR) 20 MG 24 hr capsule Take 20 mg by mouth daily.     amphetamine-dextroamphetamine (ADDERALL) 5 MG tablet Take 5 mg by mouth daily in the afternoon.     azelastine (ASTELIN) 0.1 % nasal spray 2 sprays per nostril 1-2 times daily as needed. 30 mL 5   Cholecalciferol (VITAMIN D3) 25 MCG (1000 UT) CAPS Take 1 capsule (1,000 Units total) by mouth daily. 30 capsule 3   FLUoxetine (PROZAC) 10 MG capsule Take 10 mg by mouth daily.     fluticasone (FLOVENT HFA) 110 MCG/ACT inhaler Inhale 2 puffs into the lungs 2 (two) times daily. With spacer. 1 each 5   Galcanezumab-gnlm (EMGALITY) 120 MG/ML SOAJ Inject 1 pen  into the skin every 30 (thirty) days. 3 mL 3   ibuprofen (ADVIL) 200 MG tablet Take 400 mg by  mouth every 6 (six) hours as needed for moderate pain.     levothyroxine (SYNTHROID) 125 MCG tablet Take 1 tablet (125 mcg total) by mouth daily. 90 tablet 3   olmesartan-hydrochlorothiazide (BENICAR HCT) 20-12.5 MG tablet TAKE 1 TABLET BY MOUTH DAILY 90 tablet 0   psyllium (METAMUCIL) 58.6 % packet Take 1 packet by mouth 2 (two) times daily.  60 packet 2   rizatriptan (MAXALT-MLT) 10 MG disintegrating tablet DISSOLVE ONE TABLET BY MOUTH AS NEEDED FOR MIGRAINE. MAY REPEAT IN 2 HOURS IF NEEDED. NO MORE THAN 2 TABLETS DAILY OR 10 TABLETS A MONTH. 10 tablet 5   tiZANidine (ZANAFLEX) 4 MG tablet Take 1 tablet (4 mg total) by mouth at bedtime as needed for muscle spasms. 30 tablet 0   triamcinolone (NASACORT) 55 MCG/ACT AERO nasal inhaler Place 1 spray into the nose daily. 16.9 mL 5   levocetirizine (XYZAL) 5 MG tablet Take 1 tablet (5 mg total) by mouth daily. 30 tablet 2   No facility-administered medications prior to visit.    Allergies  Allergen Reactions   Poison Ivy Extract Anaphylaxis   Poison Oak Extract Anaphylaxis   Anser Anser Feather (Goose) Allergy Skin Test    Flexeril [Cyclobenzaprine] Hives   Lactose Intolerance (Gi) Diarrhea   Honeysuckle Flower [Lonicera] Rash   Mixed Grasses Rash    ROS Review of Systems  Constitutional:  Positive for fatigue. Negative for chills and fever.  Eyes:  Negative for visual disturbance.  Respiratory:  Negative for chest tightness and shortness of breath.   Neurological:  Negative for dizziness and headaches.      Objective:    Physical Exam HENT:     Head: Normocephalic.     Mouth/Throat:     Mouth: Mucous membranes are moist.  Cardiovascular:     Rate and Rhythm: Normal rate.     Heart sounds: Normal heart sounds.  Pulmonary:     Effort: Pulmonary effort is normal.     Breath sounds: Normal breath sounds.  Neurological:     Mental Status: She is alert.     BP 116/74   Pulse 98   Ht 5\' 7"  (1.702 m)   Wt 283 lb 1.3 oz (128.4  kg)   LMP 03/10/2022 (Approximate)   SpO2 96%   BMI 44.34 kg/m  Wt Readings from Last 3 Encounters:  01/23/23 283 lb 1.3 oz (128.4 kg)  01/22/23 291 lb 12.8 oz (132.4 kg)  01/20/23 280 lb (127 kg)    Lab Results  Component Value Date   TSH 8.570 (H) 01/23/2023   Lab Results  Component Value Date   WBC 10.7 (H) 01/20/2023   HGB 13.3 01/20/2023   HCT 38.5 01/20/2023   MCV 86.7 01/20/2023   PLT 401 (H) 01/20/2023   Lab Results  Component Value Date   NA 133 (L) 01/23/2023   K 4.2 01/23/2023   CO2 21 01/23/2023   GLUCOSE 91 01/23/2023   BUN 7 01/23/2023   CREATININE 0.88 01/23/2023   BILITOT 0.5 01/20/2023   ALKPHOS 63 01/20/2023   AST 14 (L) 01/20/2023   ALT 19 01/20/2023   PROT 7.4 01/20/2023   ALBUMIN 3.8 01/20/2023   CALCIUM 9.5 01/23/2023   ANIONGAP 9 01/20/2023   EGFR 89 01/23/2023   Lab Results  Component Value Date   CHOL 187 09/23/2022   Lab Results  Component Value Date   HDL 38 (L) 09/23/2022   Lab Results  Component Value Date   LDLCALC 133 (H) 09/23/2022   Lab Results  Component Value Date   TRIG 87 09/23/2022   Lab Results  Component Value Date   CHOLHDL 4.9 (H) 09/23/2022   Lab Results  Component Value Date   HGBA1C 5.3 09/23/2022      Assessment & Plan:  Hot flashes Assessment & Plan: The patient reports experiencing hot flashes for the past  three months and would like to have her hormone levels assessed. She is currently under the care of her gynecologist and is encouraged to discuss hormone level evaluation with her specialist. In the meantime, I recommend nonpharmacological interventions to manage symptoms, including dressing in layers, maintaining a cool environment using fans or air conditioning, and avoiding triggers such as spicy foods, caffeine, alcohol, and hot beverages.   Other fatigue Assessment & Plan: The patient has a history of iron deficiency anemia and is currently receiving iron infusions. She reports  experiencing fatigue. We will assess her thyroid levels today, given her history of hypothyroidism. Additionally, I encourage maintaining a well-balanced diet and increasing physical activity to help manage her fatigue.  Orders: -     TSH + free T4 -     BMP8+EGFR  Other orders -     BMP8+EGFR   Note: This chart has been completed using Engineer, civil (consulting) software, and while attempts have been made to ensure accuracy, certain words and phrases may not be transcribed as intended.   Follow-up: No follow-ups on file.   Gilmore Laroche, FNP

## 2023-01-23 NOTE — Patient Instructions (Addendum)
I appreciate the opportunity to provide care to you today!    Follow up:  3 months  Labs: please stop by the lab today to get your blood drawn (BMP and TSH)  Nonpharmacological Interventions for Hot Flashes: -Dress in Layers: Child psychotherapist, breathable clothing and dress in layers that can be easily removed. Cool Environment: Keep your living environment cool by using fans, air conditioning, or opening windows. Avoid Triggers: Identify and avoid personal triggers such as spicy foods, caffeine, alcohol, and hot beverages. -Balanced Diet: Eat a well-balanced diet rich in fruits, vegetables, whole grains, and lean proteins. Hydration: Drink plenty of water to stay hydrated and help regulate body temperature. -Deep Breathing Exercises: Practice deep breathing or relaxation techniques to manage stress and reduce the intensity of hot flashes. Mindfulness and Meditation: Engage in mindfulness practices or meditation to help manage symptoms and improve overall well-being. -Regular Exercise: Engage in regular physical activity such as walking, swimming, or yoga, which can help regulate body temperature and reduce stress. -Comfortable Sleep Environment: Maintain a cool, comfortable sleep environment and use lightweight bedding to reduce night sweats. Consistent Sleep Schedule: Follow a regular sleep schedule and avoid stimulants close to bedtime.    Please continue to a heart-healthy diet and increase your physical activities. Try to exercise for at least five days a week.    It was a pleasure to see you and I look forward to continuing to work together on your health and well-being. Please do not hesitate to call the office if you need care or have questions about your care.  In case of emergency, please visit the Emergency Department for urgent care, or contact our clinic at [phone] to schedule an appointment. We're here to help you!   Have a wonderful day and week. With  Gratitude, Gilmore Laroche MSN, FNP-BC

## 2023-01-23 NOTE — Telephone Encounter (Signed)
PA approved for TCS/EGD from 01/22/23-04/22/23 through [employer]

## 2023-01-24 DIAGNOSIS — R232 Flushing: Secondary | ICD-10-CM | POA: Insufficient documentation

## 2023-01-24 LAB — BMP8+EGFR
BUN/Creatinine Ratio: 8 — ABNORMAL LOW (ref 9–23)
BUN: 7 mg/dL (ref 6–20)
CO2: 21 mmol/L (ref 20–29)
Calcium: 9.5 mg/dL (ref 8.7–10.2)
Chloride: 97 mmol/L (ref 96–106)
Creatinine, Ser: 0.88 mg/dL (ref 0.57–1.00)
Glucose: 91 mg/dL (ref 70–99)
Potassium: 4.2 mmol/L (ref 3.5–5.2)
Sodium: 133 mmol/L — ABNORMAL LOW (ref 134–144)
eGFR: 89 mL/min/{1.73_m2} (ref >59)

## 2023-01-24 LAB — TSH+FREE T4
Free T4: 1.25 ng/dL (ref 0.82–1.77)
TSH: 8.57 u[IU]/mL — ABNORMAL HIGH (ref 0.450–4.500)

## 2023-01-24 NOTE — Assessment & Plan Note (Signed)
The patient reports experiencing hot flashes for the past three months and would like to have her hormone levels assessed. She is currently under the care of her gynecologist and is encouraged to discuss hormone level evaluation with her specialist. In the meantime, I recommend nonpharmacological interventions to manage symptoms, including dressing in layers, maintaining a cool environment using fans or air conditioning, and avoiding triggers such as spicy foods, caffeine, alcohol, and hot beverages.

## 2023-01-24 NOTE — Assessment & Plan Note (Signed)
The patient has a history of iron deficiency anemia and is currently receiving iron infusions. She reports experiencing fatigue. We will assess her thyroid levels today, given her history of hypothyroidism. Additionally, I encourage maintaining a well-balanced diet and increasing physical activity to help manage her fatigue.

## 2023-01-25 ENCOUNTER — Other Ambulatory Visit: Payer: Self-pay | Admitting: Family Medicine

## 2023-01-25 DIAGNOSIS — G8929 Other chronic pain: Secondary | ICD-10-CM

## 2023-01-25 DIAGNOSIS — E038 Other specified hypothyroidism: Secondary | ICD-10-CM

## 2023-01-25 LAB — CELIAC AB TTG DGP TIGA
Antigliadin Abs, IgA: 4 U (ref 0–19)
Gliadin IgG: 2 U (ref 0–19)
IgA/Immunoglobulin A, Serum: 282 mg/dL (ref 87–352)
Tissue Transglut Ab: 4 U/mL (ref 0–5)
Transglutaminase IgA: <2 U/mL (ref 0–3)

## 2023-01-25 MED ORDER — LEVOTHYROXINE SODIUM 100 MCG PO TABS
100.0000 ug | ORAL_TABLET | Freq: Every day | ORAL | 1 refills | Status: DC
Start: 2023-01-25 — End: 2023-03-18

## 2023-01-28 ENCOUNTER — Encounter: Payer: Self-pay | Admitting: Family Medicine

## 2023-02-01 ENCOUNTER — Emergency Department (HOSPITAL_COMMUNITY)
Admission: EM | Admit: 2023-02-01 | Discharge: 2023-02-01 | Disposition: A | Discharge status: Home / Self Care | Payer: Medicaid Other | Attending: Emergency Medicine | Admitting: Emergency Medicine

## 2023-02-01 ENCOUNTER — Encounter (HOSPITAL_COMMUNITY): Payer: Self-pay

## 2023-02-01 DIAGNOSIS — K279 Peptic ulcer, site unspecified, unspecified as acute or chronic, without hemorrhage or perforation: Secondary | ICD-10-CM | POA: Insufficient documentation

## 2023-02-01 DIAGNOSIS — M546 Pain in thoracic spine: Secondary | ICD-10-CM | POA: Diagnosis present

## 2023-02-01 DIAGNOSIS — G43809 Other migraine, not intractable, without status migrainosus: Secondary | ICD-10-CM | POA: Diagnosis not present

## 2023-02-01 LAB — COMPREHENSIVE METABOLIC PANEL
ALT: 18 U/L (ref 0–44)
AST: 14 U/L — ABNORMAL LOW (ref 15–41)
Albumin: 3.9 g/dL (ref 3.5–5.0)
Alkaline Phosphatase: 61 U/L (ref 38–126)
Anion gap: 8 (ref 5–15)
BUN: 10 mg/dL (ref 6–20)
CO2: 29 mmol/L (ref 22–32)
Calcium: 8.8 mg/dL — ABNORMAL LOW (ref 8.9–10.3)
Chloride: 98 mmol/L (ref 98–111)
Creatinine, Ser: 0.73 mg/dL (ref 0.44–1.00)
GFR, Estimated: >60 mL/min (ref >60)
Glucose, Bld: 112 mg/dL — ABNORMAL HIGH (ref 70–99)
Potassium: 3.4 mmol/L — ABNORMAL LOW (ref 3.5–5.1)
Sodium: 135 mmol/L (ref 135–145)
Total Bilirubin: 0.5 mg/dL (ref 0.3–1.2)
Total Protein: 7.8 g/dL (ref 6.5–8.1)

## 2023-02-01 LAB — CBC WITH DIFFERENTIAL/PLATELET
Abs Immature Granulocytes: 0.03 10*3/uL (ref 0.00–0.07)
Basophils Absolute: 0.1 10*3/uL (ref 0.0–0.1)
Basophils Relative: 1 %
Eosinophils Absolute: 0 10*3/uL (ref 0.0–0.5)
Eosinophils Relative: 0 %
HCT: 41 % (ref 36.0–46.0)
Hemoglobin: 14.2 g/dL (ref 12.0–15.0)
Immature Granulocytes: 0 %
Lymphocytes Relative: 19 %
Lymphs Abs: 1.9 10*3/uL (ref 0.7–4.0)
MCH: 30.3 pg (ref 26.0–34.0)
MCHC: 34.6 g/dL (ref 30.0–36.0)
MCV: 87.6 fL (ref 80.0–100.0)
Monocytes Absolute: 0.5 10*3/uL (ref 0.1–1.0)
Monocytes Relative: 5 %
Neutro Abs: 7.4 10*3/uL (ref 1.7–7.7)
Neutrophils Relative %: 75 %
Platelets: 430 10*3/uL — ABNORMAL HIGH (ref 150–400)
RBC: 4.68 MIL/uL (ref 3.87–5.11)
RDW: 13.7 % (ref 11.5–15.5)
WBC: 10 10*3/uL (ref 4.0–10.5)
nRBC: 0 % (ref 0.0–0.2)

## 2023-02-01 LAB — LIPASE, BLOOD: Lipase: 22 U/L (ref 11–51)

## 2023-02-01 MED ORDER — PROCHLORPERAZINE EDISYLATE 10 MG/2ML IJ SOLN
10.0000 mg | Freq: Once | INTRAMUSCULAR | Status: AC
  Administered 2023-02-01: 10 mg via INTRAVENOUS
  Filled 2023-02-01: qty 2

## 2023-02-01 MED ORDER — PANTOPRAZOLE SODIUM 40 MG IV SOLR
40.0000 mg | Freq: Once | INTRAVENOUS | Status: AC
  Administered 2023-02-01: 40 mg via INTRAVENOUS
  Filled 2023-02-01: qty 10

## 2023-02-01 MED ORDER — FENTANYL CITRATE PF 50 MCG/ML IJ SOSY
50.0000 ug | PREFILLED_SYRINGE | Freq: Once | INTRAMUSCULAR | Status: AC
  Administered 2023-02-01: 50 ug via INTRAVENOUS
  Filled 2023-02-01: qty 1

## 2023-02-01 MED ORDER — SUCRALFATE 1 G PO TABS: 1.0000 g | ORAL_TABLET | Freq: Three times a day (TID) | ORAL | 0 refills | Status: DC

## 2023-02-01 MED ORDER — OMEPRAZOLE 40 MG PO CPDR: 40.0000 mg | DELAYED_RELEASE_CAPSULE | Freq: Every day | ORAL | 0 refills | Status: DC

## 2023-02-01 MED ORDER — ACETAMINOPHEN 500 MG PO TABS: 500.0000 mg | ORAL_TABLET | Freq: Four times a day (QID) | ORAL | 0 refills | Status: DC | PRN

## 2023-02-01 MED ORDER — DIPHENHYDRAMINE HCL 50 MG/ML IJ SOLN
25.0000 mg | Freq: Once | INTRAMUSCULAR | Status: AC
  Administered 2023-02-01: 25 mg via INTRAVENOUS
  Filled 2023-02-01: qty 1

## 2023-02-01 NOTE — ED Provider Notes (Signed)
[place] EMERGENCY DEPARTMENT AT [facility] Provider Note   CSN: [id number] Arrival date & time: 02/01/23  1027     History  Chief Complaint  Patient presents with   Back Pain    Kristin Baker is a 32 y.o. female.  HPI     32 year old patient comes in with chief complaint of back pain.  Patient indicates that the pain woke her up in the middle of the night.  The pain is in the thoracic back region and radiates to her abdomen.  She has associated nausea with vomiting.  Patient has known degenerative spine disease and is supposed to get upper and lower endoscopy by GI, as they are thinking she might have an ulcer.  Patient has past medical history of rheumatoid arthritis.  Additionally, patient indicates that now she is also having migraine headaches as a result of her pain.  Home Medications Prior to Admission medications   Medication Sig Start Date End Date Taking? Authorizing Provider  omeprazole (PRILOSEC) 40 MG capsule Take 1 capsule (40 mg total) by mouth daily. 02/01/23  Yes Derwood Kaplan, MD  sucralfate (CARAFATE) 1 g tablet Take 1 tablet (1 g total) by mouth 4 (four) times daily -  with meals and at bedtime. 02/01/23  Yes Derwood Kaplan, MD  acetaminophen (TYLENOL) 500 MG tablet Take 1-2 tablets (500-1,000 mg total) by mouth every 6 (six) hours as needed for moderate pain. 02/01/23   Derwood Kaplan, MD  albuterol (VENTOLIN HFA) 108 (90 Base) MCG/ACT inhaler Inhale 2 puffs into the lungs every 6 (six) hours as needed for wheezing or shortness of breath. 10/23/22   Gilmore Laroche, FNP  amphetamine-dextroamphetamine (ADDERALL XR) 20 MG 24 hr capsule Take 20 mg by mouth daily. 04/02/22   [provider]  amphetamine-dextroamphetamine (ADDERALL) 5 MG tablet Take 5 mg by mouth daily in the afternoon.    [provider]  azelastine (ASTELIN) 0.1 % nasal spray 2 sprays per nostril 1-2 times daily as needed. 07/18/21   [name], MD   Cholecalciferol (VITAMIN D3) 25 MCG (1000 UT) CAPS Take 1 capsule (1,000 Units total) by mouth daily. 08/27/21   [name], FNP  FLUoxetine (PROZAC) 10 MG capsule Take 10 mg by mouth daily. 10/22/22   [provider]  fluticasone (FLOVENT HFA) 110 MCG/ACT inhaler Inhale 2 puffs into the lungs 2 (two) times daily. With spacer. 07/18/21   [name], MD  Galcanezumab-gnlm [facility]) 120 MG/ML SOAJ Inject 1 pen  into the skin every 30 (thirty) days. 06/12/22   Lomax, Amy, NP  ibuprofen (ADVIL) 200 MG tablet Take 400 mg by mouth every 6 (six) hours as needed for moderate pain.    [provider]  levocetirizine (XYZAL) 5 MG tablet Take 1 tablet (5 mg total) by mouth daily. 09/23/22 01/22/23  Gilmore Laroche, FNP  levothyroxine (SYNTHROID) 100 MCG tablet Take 1 tablet (100 mcg total) by mouth daily. 01/25/23   Gilmore Laroche, FNP  olmesartan-hydrochlorothiazide (BENICAR HCT) 20-12.5 MG tablet TAKE 1 TABLET BY MOUTH DAILY 11/25/22   Gilmore Laroche, FNP  psyllium (METAMUCIL) 58.6 % packet Take 1 packet by mouth 2 (two) times daily. 01/22/23 04/22/23  Franky Macho, MD  rizatriptan (MAXALT-MLT) 10 MG disintegrating tablet DISSOLVE ONE TABLET BY MOUTH AS NEEDED FOR MIGRAINE. MAY REPEAT IN 2 HOURS IF NEEDED. NO MORE THAN 2 TABLETS DAILY OR 10 TABLETS A MONTH. 06/12/22   Lomax, Amy, NP  tiZANidine (ZANAFLEX) 4 MG tablet Take 1  tablet (4 mg total) by mouth at bedtime as needed for muscle spasms. 09/23/22   Gilmore Laroche, FNP  triamcinolone (NASACORT) 55 MCG/ACT AERO nasal inhaler Place 1 spray into the nose daily. 07/18/21   [name], MD      Allergies    Poison ivy extract, Poison oak extract, Anser anser feather (goose) allergy skin test, Flexeril [cyclobenzaprine], Lactose intolerance (gi), Honeysuckle flower [lonicera], and Mixed grasses    Review of Systems   Review of Systems  All other systems reviewed and are negative.   Physical Exam Updated Vital  Signs BP 122/70   Pulse 75   Temp (!) 97.5 F (36.4 C) (Oral)   Resp 18   Ht 5\' 7"  (1.702 m)   Wt 128.4 kg   LMP 03/10/2022 (Approximate)   SpO2 98%   BMI 44.34 kg/m  Physical Exam Vitals and nursing note reviewed.  Constitutional:      Appearance: She is well-developed.  HENT:     Head: Atraumatic.  Eyes:     Extraocular Movements: Extraocular movements intact.     Pupils: Pupils are equal, round, and reactive to light.  Cardiovascular:     Rate and Rhythm: Normal rate.  Pulmonary:     Effort: Pulmonary effort is normal.  Abdominal:     Palpations: Abdomen is soft.     Tenderness: There is abdominal tenderness. There is no guarding or rebound.     Comments: Epigastric tenderness noted  Musculoskeletal:     Cervical back: Normal range of motion and neck supple.     Comments: Midline thoracic spine tenderness noted  Skin:    General: Skin is warm and dry.  Neurological:     Mental Status: She is alert and oriented to person, place, and time.     ED Results / Procedures / Treatments   Labs (all labs ordered are listed, but only abnormal results are displayed) Labs Reviewed  COMPREHENSIVE METABOLIC PANEL - Abnormal; Notable for the following components:      Result Value   Potassium 3.4 (*)    Glucose, Bld 112 (*)    Calcium 8.8 (*)    AST 14 (*)    All other components within normal limits  CBC WITH DIFFERENTIAL/PLATELET - Abnormal; Notable for the following components:   Platelets 430 (*)    All other components within normal limits  LIPASE, BLOOD    EKG None  Radiology No results found.  Procedures Procedures    Medications Ordered in ED Medications  pantoprazole (PROTONIX) injection 40 mg (40 mg Intravenous Given 02/01/23 0827)  diphenhydrAMINE (BENADRYL) injection 25 mg (25 mg Intravenous Given 02/01/23 0825)  prochlorperazine (COMPAZINE) injection 10 mg (10 mg Intravenous Given 02/01/23 0826)  fentaNYL (SUBLIMAZE) injection 50 mcg (50 mcg  Intravenous Given 02/01/23 1308)    ED Course/ Medical Decision Making/ A&P                                 Medical Decision Making Amount and/or Complexity of Data Reviewed Labs: ordered.  Risk OTC drugs. Prescription drug management.   This patient presents to the ED with chief complaint(s) of abdominal pain, back pain, nausea and vomiting and headaches with pertinent past medical history of rheumatoid arthritis, chronic arthritis of the back, questionable PUD and migraines.The complaint involves an extensive differential diagnosis and also carries with it a high risk of complications and morbidity.    The  differential diagnosis includes  Pancreatitis, Hepatobiliary pathology including cholelithiasis and cholecystitis, Gastritis/peptic ulcer disease, small bowel obstruction, Acute coronary syndrome, Aortic Dissection, worsening thoracic spine disease, compression fracture. For the headache, differential primarily includes migraine.   The initial plan is to get basic labs and focus on symptom management. No clear indication of any imaging at this time.  Additional history obtained: Records reviewed previous admission documents and previous imaging including MRI of the T-spine that was completed December, 2023.  Have also reviewed PCP notes, it appears that patient is scheduled for upper endoscopy later.  Independent labs interpretation:  The following labs were independently interpreted: CBC, lipase and CMP are reassuring.   Treatment and Reassessment: Patient reassessed.  She indicates that the abdominal pain and the migraine has resolved.  She still having pain in her back.  Pain is still described as burning pain.  I discussed with the patient that my suspicion for acute spine emergency is low.  I reviewed the MRI from earlier this year which was reassuring.  She confirms that the spine doctors are not too sure what to do with her pain at this time.  I discussed with her that  my recommendation would be that we start treating this like a peptic ulcer disease and she would return to the ER if her symptoms worsen.  At this time with normal white count, normal lipase and labs, and response to antacids -we do not think CT scan would be helpful.  Plan is to discharge patient with omeprazole and Carafate.  She will continue to take Tylenol.  Return precautions discussed.   Final Clinical Impression(s) / ED Diagnoses Final diagnoses:  PUD (peptic ulcer disease)  Acute midline thoracic back pain  Other migraine without status migrainosus, not intractable    Rx / DC Orders ED Discharge Orders          Ordered    sucralfate (CARAFATE) 1 g tablet  3 times daily with meals & bedtime        02/01/23 0940    omeprazole (PRILOSEC) 40 MG capsule  Daily        02/01/23 0940    acetaminophen (TYLENOL) 500 MG tablet  Every 6 hours PRN        02/01/23 0941              Derwood Kaplan, MD 02/01/23 1004

## 2023-02-01 NOTE — Discharge Instructions (Addendum)
You were seen in the emergency room for pain. Our workup in the emergency room is overall reassuring.  Our suspicion is that you might be having peptic ulcer disease related pain.  Pain also could be because of your chronic spine/back issues.  Start taking the medications that are prescribed to help with gastritis or peptic ulcer disease. Follow-up with the GI doctors as planned.  Please return to the ER if your symptoms worsen; you have increased pain, fevers, chills, inability to keep any medications down, confusion. Otherwise see the outpatient doctor as requested.

## 2023-02-01 NOTE — ED Triage Notes (Signed)
Pt here this morning for back pain and abdominal pain as well as N/V. Pt stated she thinks she may have a gastric ulcer but can't have an endoscopy done until the 22nd

## 2023-02-13 ENCOUNTER — Other Ambulatory Visit (HOSPITAL_COMMUNITY)
Admission: RE | Admit: 2023-02-13 | Discharge: 2023-02-13 | Disposition: A | Discharge status: Home / Self Care | Payer: Medicaid Other | Source: Ambulatory Visit | Attending: Gastroenterology | Admitting: Gastroenterology

## 2023-02-13 DIAGNOSIS — D500 Iron deficiency anemia secondary to blood loss (chronic): Secondary | ICD-10-CM | POA: Insufficient documentation

## 2023-02-13 DIAGNOSIS — R195 Other fecal abnormalities: Secondary | ICD-10-CM | POA: Diagnosis present

## 2023-02-13 LAB — BASIC METABOLIC PANEL
Anion gap: 12 (ref 5–15)
BUN: 8 mg/dL (ref 6–20)
CO2: 23 mmol/L (ref 22–32)
Calcium: 9 mg/dL (ref 8.9–10.3)
Chloride: 99 mmol/L (ref 98–111)
Creatinine, Ser: 0.8 mg/dL (ref 0.44–1.00)
GFR, Estimated: >60 mL/min (ref >60)
Glucose, Bld: 91 mg/dL (ref 70–99)
Potassium: 3.9 mmol/L (ref 3.5–5.1)
Sodium: 134 mmol/L — ABNORMAL LOW (ref 135–145)

## 2023-02-17 ENCOUNTER — Ambulatory Visit (HOSPITAL_COMMUNITY): Payer: Medicaid Other | Admitting: Anesthesiology

## 2023-02-17 ENCOUNTER — Other Ambulatory Visit: Payer: Self-pay

## 2023-02-17 ENCOUNTER — Encounter (HOSPITAL_COMMUNITY): Payer: Self-pay

## 2023-02-17 ENCOUNTER — Ambulatory Visit (HOSPITAL_COMMUNITY)
Admission: RE | Admit: 2023-02-17 | Discharge: 2023-02-17 | Disposition: A | Discharge status: Home / Self Care | Payer: Medicaid Other | Attending: Gastroenterology | Admitting: Gastroenterology

## 2023-02-17 ENCOUNTER — Encounter (HOSPITAL_COMMUNITY)
Admission: RE | Disposition: A | Discharge status: Home / Self Care | Payer: Self-pay | Source: Home / Self Care | Attending: Gastroenterology

## 2023-02-17 DIAGNOSIS — K3189 Other diseases of stomach and duodenum: Secondary | ICD-10-CM

## 2023-02-17 DIAGNOSIS — D128 Benign neoplasm of rectum: Secondary | ICD-10-CM | POA: Diagnosis not present

## 2023-02-17 DIAGNOSIS — F17210 Nicotine dependence, cigarettes, uncomplicated: Secondary | ICD-10-CM | POA: Insufficient documentation

## 2023-02-17 DIAGNOSIS — Z8041 Family history of malignant neoplasm of ovary: Secondary | ICD-10-CM | POA: Diagnosis not present

## 2023-02-17 DIAGNOSIS — K921 Melena: Secondary | ICD-10-CM | POA: Diagnosis present

## 2023-02-17 DIAGNOSIS — Z90710 Acquired absence of both cervix and uterus: Secondary | ICD-10-CM | POA: Diagnosis not present

## 2023-02-17 DIAGNOSIS — I10 Essential (primary) hypertension: Secondary | ICD-10-CM

## 2023-02-17 DIAGNOSIS — D509 Iron deficiency anemia, unspecified: Secondary | ICD-10-CM | POA: Insufficient documentation

## 2023-02-17 DIAGNOSIS — K648 Other hemorrhoids: Secondary | ICD-10-CM | POA: Insufficient documentation

## 2023-02-17 DIAGNOSIS — Z8049 Family history of malignant neoplasm of other genital organs: Secondary | ICD-10-CM | POA: Insufficient documentation

## 2023-02-17 DIAGNOSIS — K635 Polyp of colon: Secondary | ICD-10-CM

## 2023-02-17 DIAGNOSIS — Z6841 Body Mass Index (BMI) 40.0 and over, adult: Secondary | ICD-10-CM | POA: Insufficient documentation

## 2023-02-17 DIAGNOSIS — F909 Attention-deficit hyperactivity disorder, unspecified type: Secondary | ICD-10-CM | POA: Insufficient documentation

## 2023-02-17 DIAGNOSIS — E039 Hypothyroidism, unspecified: Secondary | ICD-10-CM | POA: Insufficient documentation

## 2023-02-17 DIAGNOSIS — G8929 Other chronic pain: Secondary | ICD-10-CM | POA: Insufficient documentation

## 2023-02-17 DIAGNOSIS — K2970 Gastritis, unspecified, without bleeding: Secondary | ICD-10-CM | POA: Diagnosis not present

## 2023-02-17 DIAGNOSIS — Z791 Long term (current) use of non-steroidal anti-inflammatories (NSAID): Secondary | ICD-10-CM | POA: Insufficient documentation

## 2023-02-17 DIAGNOSIS — D125 Benign neoplasm of sigmoid colon: Secondary | ICD-10-CM | POA: Diagnosis not present

## 2023-02-17 DIAGNOSIS — K6389 Other specified diseases of intestine: Secondary | ICD-10-CM | POA: Diagnosis not present

## 2023-02-17 DIAGNOSIS — K644 Residual hemorrhoidal skin tags: Secondary | ICD-10-CM | POA: Diagnosis not present

## 2023-02-17 DIAGNOSIS — K2950 Unspecified chronic gastritis without bleeding: Secondary | ICD-10-CM

## 2023-02-17 DIAGNOSIS — Z803 Family history of malignant neoplasm of breast: Secondary | ICD-10-CM | POA: Diagnosis not present

## 2023-02-17 HISTORY — PX: BIOPSY: SHX5522

## 2023-02-17 HISTORY — PX: ESOPHAGOGASTRODUODENOSCOPY (EGD) WITH PROPOFOL: SHX5813

## 2023-02-17 HISTORY — PX: POLYPECTOMY: SHX5525

## 2023-02-17 HISTORY — PX: COLONOSCOPY WITH PROPOFOL: SHX5780

## 2023-02-17 LAB — HM COLONOSCOPY

## 2023-02-17 SURGERY — COLONOSCOPY WITH PROPOFOL
Anesthesia: General

## 2023-02-17 MED ORDER — EPHEDRINE 5 MG/ML INJ
INTRAVENOUS | Status: AC
  Filled 2023-02-17: qty 5

## 2023-02-17 MED ORDER — PROPOFOL 10 MG/ML IV BOLUS
INTRAVENOUS | Status: DC | PRN
  Administered 2023-02-17: 80 mg via INTRAVENOUS
  Administered 2023-02-17: 50 mg via INTRAVENOUS

## 2023-02-17 MED ORDER — DEXMEDETOMIDINE HCL IN NACL 80 MCG/20ML IV SOLN
INTRAVENOUS | Status: DC | PRN
Start: 2023-02-17 — End: 2023-02-17
  Administered 2023-02-17: 12 ug via INTRAVENOUS

## 2023-02-17 MED ORDER — EPHEDRINE SULFATE-NACL 50-0.9 MG/10ML-% IV SOSY
PREFILLED_SYRINGE | INTRAVENOUS | Status: DC | PRN
  Administered 2023-02-17 (×2): 10 mg via INTRAVENOUS

## 2023-02-17 MED ORDER — PROPOFOL 500 MG/50ML IV EMUL
INTRAVENOUS | Status: DC | PRN
  Administered 2023-02-17: 150 ug/kg/min via INTRAVENOUS

## 2023-02-17 MED ORDER — LACTATED RINGERS IV SOLN: INTRAVENOUS | Status: DC

## 2023-02-17 MED ORDER — PROPOFOL 500 MG/50ML IV EMUL
INTRAVENOUS | Status: AC
  Filled 2023-02-17: qty 50

## 2023-02-17 MED ORDER — PHENYLEPHRINE 80 MCG/ML (10ML) SYRINGE FOR IV PUSH (FOR BLOOD PRESSURE SUPPORT)
PREFILLED_SYRINGE | INTRAVENOUS | Status: DC | PRN
Start: 2023-02-17 — End: 2023-02-17
  Administered 2023-02-17: 80 ug via INTRAVENOUS
  Administered 2023-02-17: 160 ug via INTRAVENOUS
  Administered 2023-02-17: 80 ug via INTRAVENOUS

## 2023-02-17 MED ORDER — GLYCOPYRROLATE PF 0.2 MG/ML IJ SOSY
PREFILLED_SYRINGE | INTRAMUSCULAR | Status: DC | PRN
  Administered 2023-02-17: .1 mg via INTRAVENOUS

## 2023-02-17 MED ORDER — VASOPRESSIN 20 UNIT/ML IV SOLN
INTRAVENOUS | Status: DC | PRN
Start: 2023-02-17 — End: 2023-02-17
  Administered 2023-02-17 (×2): 1 [IU] via INTRAVENOUS

## 2023-02-17 MED ORDER — LIDOCAINE HCL (CARDIAC) PF 100 MG/5ML IV SOSY
PREFILLED_SYRINGE | INTRAVENOUS | Status: DC | PRN
  Administered 2023-02-17: 100 mg via INTRAVENOUS

## 2023-02-17 MED ORDER — PHENYLEPHRINE 80 MCG/ML (10ML) SYRINGE FOR IV PUSH (FOR BLOOD PRESSURE SUPPORT)
PREFILLED_SYRINGE | INTRAVENOUS | Status: AC
  Filled 2023-02-17: qty 10

## 2023-02-17 NOTE — Interval H&P Note (Signed)
History and Physical Interval Note:  02/17/2023 9:07 AM  Kristin Baker  has presented today for surgery, with the diagnosis of positive fecal test.  The various methods of treatment have been discussed with the patient and family. After consideration of risks, benefits and other options for treatment, the patient has consented to  Procedure(s) with comments: COLONOSCOPY WITH PROPOFOL (N/A) - 9:45am;asa 2 ESOPHAGOGASTRODUODENOSCOPY (EGD) WITH PROPOFOL (N/A) - 9:45am;asa 2 as a surgical intervention.  The patient's history has been reviewed, patient examined, no change in status, stable for surgery.  I have reviewed the patient's chart and labs.  Questions were answered to the patient's satisfaction.     [name]

## 2023-02-17 NOTE — Anesthesia Preprocedure Evaluation (Signed)
Anesthesia Evaluation  Patient identified by MRN, date of birth, ID band Patient awake    Reviewed: Allergy & Precautions, H&P , NPO status , Patient's Chart, lab work & pertinent test results, reviewed documented beta blocker date and time   Airway Mallampati: II  TM Distance: >3 FB Neck ROM: full    Dental no notable dental hx.    Pulmonary neg pulmonary ROS, Current Smoker   Pulmonary exam normal breath sounds clear to auscultation       Cardiovascular Exercise Tolerance: Good hypertension, negative cardio ROS  Rhythm:regular Rate:Normal     Neuro/Psych  Headaches PSYCHIATRIC DISORDERS Anxiety Depression    negative neurological ROS  negative psych ROS   GI/Hepatic negative GI ROS, Neg liver ROS,,,  Endo/Other  Hypothyroidism  Morbid obesity  Renal/GU negative Renal ROS  negative genitourinary   Musculoskeletal   Abdominal   Peds  Hematology negative hematology ROS (+) Blood dyscrasia, anemia   Anesthesia Other Findings   Reproductive/Obstetrics negative OB ROS                             Anesthesia Physical Anesthesia Plan  ASA: 3  Anesthesia Plan: General   Post-op Pain Management:    Induction:   PONV Risk Score and Plan: Propofol infusion  Airway Management Planned:   Additional Equipment:   Intra-op Plan:   Post-operative Plan:   Informed Consent: I have reviewed the patients History and Physical, chart, labs and discussed the procedure including the risks, benefits and alternatives for the proposed anesthesia with the patient or authorized representative who has indicated his/her understanding and acceptance.     Dental Advisory Given  Plan Discussed with: CRNA  Anesthesia Plan Comments:        Anesthesia Quick Evaluation

## 2023-02-17 NOTE — Op Note (Signed)
[facility] Patient Name: Kristin Baker Procedure Date: 02/17/2023 10:01 AM MRN: [id number] Date of Birth: March 25, 1991 Attending MD: Sanjuan Dame , MD, [id number] CSN: [id number] Age: 32 Admit Type: Outpatient Procedure:                Colonoscopy Indications:              Hematochezia, Iron deficiency anemia Providers:                Sanjuan Dame, MD, Edrick Kins, RN, Elinor Parkinson Referring MD:              Medicines:                Monitored Anesthesia Care Complications:            No immediate complications. Estimated Blood Loss:     Estimated blood loss: none. Procedure:                Pre-Anesthesia Assessment:                           - Prior to the procedure, a History and Physical                            was performed, and patient medications and                            allergies were reviewed. The patient's tolerance of                            previous anesthesia was also reviewed. The risks                            and benefits of the procedure and the sedation                            options and risks were discussed with the patient.                            All questions were answered, and informed consent                            was obtained. Prior Anticoagulants: The patient has                            taken no anticoagulant or antiplatelet agents. ASA                            Grade Assessment: II - A patient with mild systemic                            disease. After reviewing the risks and benefits,  the patient was deemed in satisfactory condition to                            undergo the procedure.                           After obtaining informed consent, the colonoscope                            was passed under direct vision. Throughout the                            procedure, the patient's blood pressure, pulse, and                            oxygen saturations  were monitored continuously. The                            [id number]-HQ190L ([id number]) scope was introduced through                            the anus and advanced to the the terminal ileum.                            The colonoscopy was performed without difficulty.                            The patient tolerated the procedure well. The                            quality of the bowel preparation was evaluated                            using the BBPS [facility] Bowel Preparation Scale)                            with scores of: Right Colon = 3, Transverse Colon =                            3 and Left Colon = 3 (entire mucosa seen well with                            no residual staining, small fragments of stool or                            opaque liquid). The total BBPS score equals 9. The                            terminal ileum, ileocecal valve, appendiceal                            orifice, and rectum were photographed. Scope In: 10:27:24 AM Scope Out: 10:49:52 AM Scope Withdrawal Time: 0 hours 14  minutes 39 seconds  Total Procedure Duration: 0 hours 22 minutes 28 seconds  Findings:      The perianal and digital rectal examinations were normal.      Three sessile polyps were found in the rectum and sigmoid colon. The       polyps were 3 to 7 mm in size. These polyps were removed with a cold       snare. Resection and retrieval were complete.      Non-bleeding external and internal hemorrhoids were found during       retroflexion.      The terminal ileum appeared normal. Biopsies were taken with a cold       forceps for histology. Impression:               - Three 3 to 7 mm polyps in the rectum and in the                            sigmoid colon, removed with a cold snare. Resected                            and retrieved.                           - Non-bleeding external and internal hemorrhoids.                           - The examined portion of the ileum was normal.                             Biopsied. Moderate Sedation:      Per Anesthesia Care Recommendation:           - Patient has a contact number available for                            emergencies. The signs and symptoms of potential                            delayed complications were discussed with the                            patient. Return to normal activities tomorrow.                            Written discharge instructions were provided to the                            patient.                           - Resume previous diet.                           - Continue present medications.                           - Await pathology results.                           -  Repeat colonoscopy at age 50 for screening                            purposes. Procedure Code(s):        --- Professional ---                           [phone], Colonoscopy, flexible; with removal of                            tumor(s), polyp(s), or other lesion(s) by snare                            technique                           45380, 59, Colonoscopy, flexible; with biopsy,                            single or multiple Diagnosis Code(s):        --- Professional ---                           D12.8, Benign neoplasm of rectum                           D12.5, Benign neoplasm of sigmoid colon                           K64.8, Other hemorrhoids                           K92.1, Melena (includes Hematochezia)                           D50.9, Iron deficiency anemia, unspecified CPT copyright 2022 American Medical Association. All rights reserved. The codes documented in this report are preliminary and upon coder review may  be revised to meet current compliance requirements. Sanjuan Dame, MD Sanjuan Dame, MD 02/17/2023 10:57:15 AM This report has been signed electronically. Number of Addenda: 0

## 2023-02-17 NOTE — Discharge Instructions (Signed)

## 2023-02-17 NOTE — Transfer of Care (Addendum)
Immediate Anesthesia Transfer of Care Note  Patient: Kristin Baker  Procedure(s) Performed: COLONOSCOPY WITH PROPOFOL ESOPHAGOGASTRODUODENOSCOPY (EGD) WITH PROPOFOL BIOPSY POLYPECTOMY  Patient Location: Short Stay  Anesthesia Type:General  Level of Consciousness: drowsy and patient cooperative  Airway & Oxygen Therapy: Patient Spontanous Breathing and Patient connected to face mask oxygen  Post-op Assessment: Report given to RN and Post -op Vital signs reviewed and stable  Post vital signs: Reviewed and stable  Last Vitals:  Vitals Value Taken Time  BP 126/48 02/17/23   1055  Temp 36.5 02/17/23   1055  Pulse 79 02/17/23   1055  Resp    SpO2 98% 02/17/23   1055    Last Pain:  Vitals:   02/17/23 1008  TempSrc:   PainSc: 0-No pain      Patients Stated Pain Goal: 7 (02/17/23 0830)  Complications: No notable events documented.

## 2023-02-17 NOTE — Op Note (Signed)
[facility] Patient Name: Kristin Baker Procedure Date: 02/17/2023 10:06 AM MRN: [id number] Date of Birth: 03-30-1991 Attending MD: Sanjuan Dame , MD, [id number] CSN: [id number] Age: 32 Admit Type: Outpatient Procedure:                Upper GI endoscopy Indications:              Epigastric abdominal pain, Iron deficiency anemia Providers:                Sanjuan Dame, MD, Edrick Kins, RN, Elinor Parkinson Referring MD:              Medicines:                Monitored Anesthesia Care Complications:            No immediate complications. Estimated Blood Loss:     Estimated blood loss: none. Procedure:                Pre-Anesthesia Assessment:                           - Prior to the procedure, a History and Physical                            was performed, and patient medications and                            allergies were reviewed. The patient's tolerance of                            previous anesthesia was also reviewed. The risks                            and benefits of the procedure and the sedation                            options and risks were discussed with the patient.                            All questions were answered, and informed consent                            was obtained. Prior Anticoagulants: The patient has                            taken no anticoagulant or antiplatelet agents. ASA                            Grade Assessment: II - A patient with mild systemic                            disease. After reviewing the risks and benefits,  the patient was deemed in satisfactory condition to                            undergo the procedure.                           After obtaining informed consent, the endoscope was                            passed under direct vision. Throughout the                            procedure, the patient's blood pressure, pulse, and                             oxygen saturations were monitored continuously. The                            [id number]-[id number] ([id number]) scope was introduced through the                            mouth, and advanced to the second part of duodenum.                            The upper GI endoscopy was accomplished without                            difficulty. The patient tolerated the procedure                            well. Scope In: 10:15:15 AM Scope Out: 10:21:32 AM Total Procedure Duration: 0 hours 6 minutes 17 seconds  Findings:      The examined esophagus was normal.      Esophagogastric landmarks were identified: the Z-line was found at 35 cm       and the gastroesophageal junction was found at 37 cm from the incisors.      Mild inflammation characterized by nodularity was found in the entire       examined stomach. Biopsies were taken with a cold forceps for histology.      Mildly scalloped mucosa was found in the duodenal bulb. Biopsies for       histology were taken with a cold forceps for evaluation of celiac       disease. Impression:               - Normal esophagus.                           - Esophagogastric landmarks identified.                           - Gastritis. Biopsied.                           - Scalloped mucosa was found in the duodenum, rule  out celiac disease. Biopsied. Moderate Sedation:      Per Anesthesia Care Recommendation:           - Patient has a contact number available for                            emergencies. The signs and symptoms of potential                            delayed complications were discussed with the                            patient. Return to normal activities tomorrow.                            Written discharge instructions were provided to the                            patient.                           - Resume previous diet.                           - Continue present medications.                           - Await pathology  results. Procedure Code(s):        --- Professional ---                           [phone], Esophagogastroduodenoscopy, flexible,                            transoral; with biopsy, single or multiple Diagnosis Code(s):        --- Professional ---                           K29.70, Gastritis, unspecified, without bleeding                           K31.89, Other diseases of stomach and duodenum                           R10.13, Epigastric pain                           D50.9, Iron deficiency anemia, unspecified CPT copyright 2022 American Medical Association. All rights reserved. The codes documented in this report are preliminary and upon coder review may  be revised to meet current compliance requirements. Sanjuan Dame, MD Sanjuan Dame, MD 02/17/2023 10:54:05 AM This report has been signed electronically. Number of Addenda: 0

## 2023-02-17 NOTE — Anesthesia Postprocedure Evaluation (Signed)
Anesthesia Post Note  Patient: Kristin Baker  Procedure(s) Performed: COLONOSCOPY WITH PROPOFOL ESOPHAGOGASTRODUODENOSCOPY (EGD) WITH PROPOFOL BIOPSY POLYPECTOMY  Patient location during evaluation: Phase II Anesthesia Type: General Level of consciousness: awake Pain management: pain level controlled Vital Signs Assessment: post-procedure vital signs reviewed and stable Respiratory status: spontaneous breathing and respiratory function stable Cardiovascular status: blood pressure returned to baseline and stable Postop Assessment: no headache and no apparent nausea or vomiting Anesthetic complications: no Comments: Late entry   No notable events documented.   Last Vitals:  Vitals:   02/17/23 0830 02/17/23 1055  BP: (!) 134/54 (!) 126/48  Pulse: 78 79  Resp: 16   Temp: 36.9 C 36.5 C  SpO2: 98% 98%    Last Pain:  Vitals:   02/17/23 1055  TempSrc: Oral  PainSc: 0-No pain                 Windell Norfolk

## 2023-02-18 ENCOUNTER — Encounter (INDEPENDENT_AMBULATORY_CARE_PROVIDER_SITE_OTHER): Payer: Self-pay | Admitting: *Deleted

## 2023-02-18 ENCOUNTER — Emergency Department (HOSPITAL_COMMUNITY): Payer: Medicaid Other

## 2023-02-18 ENCOUNTER — Other Ambulatory Visit: Payer: Self-pay

## 2023-02-18 ENCOUNTER — Encounter (HOSPITAL_COMMUNITY): Payer: Self-pay | Admitting: *Deleted

## 2023-02-18 ENCOUNTER — Observation Stay (HOSPITAL_COMMUNITY)
Admission: EM | Admit: 2023-02-18 | Discharge: 2023-02-20 | Disposition: A | Discharge status: Home / Self Care | Payer: Medicaid Other | Attending: Family Medicine | Admitting: Family Medicine

## 2023-02-18 DIAGNOSIS — F17210 Nicotine dependence, cigarettes, uncomplicated: Secondary | ICD-10-CM | POA: Diagnosis not present

## 2023-02-18 DIAGNOSIS — Z79899 Other long term (current) drug therapy: Secondary | ICD-10-CM | POA: Insufficient documentation

## 2023-02-18 DIAGNOSIS — K648 Other hemorrhoids: Secondary | ICD-10-CM | POA: Diagnosis not present

## 2023-02-18 DIAGNOSIS — K921 Melena: Secondary | ICD-10-CM | POA: Diagnosis not present

## 2023-02-18 DIAGNOSIS — K819 Cholecystitis, unspecified: Principal | ICD-10-CM

## 2023-02-18 DIAGNOSIS — R339 Retention of urine, unspecified: Secondary | ICD-10-CM | POA: Diagnosis not present

## 2023-02-18 DIAGNOSIS — R109 Unspecified abdominal pain: Secondary | ICD-10-CM

## 2023-02-18 DIAGNOSIS — K625 Hemorrhage of anus and rectum: Secondary | ICD-10-CM | POA: Diagnosis not present

## 2023-02-18 DIAGNOSIS — E039 Hypothyroidism, unspecified: Secondary | ICD-10-CM | POA: Insufficient documentation

## 2023-02-18 DIAGNOSIS — R338 Other retention of urine: Secondary | ICD-10-CM | POA: Diagnosis not present

## 2023-02-18 DIAGNOSIS — K8012 Calculus of gallbladder with acute and chronic cholecystitis without obstruction: Principal | ICD-10-CM | POA: Insufficient documentation

## 2023-02-18 DIAGNOSIS — I10 Essential (primary) hypertension: Secondary | ICD-10-CM | POA: Diagnosis not present

## 2023-02-18 DIAGNOSIS — K8020 Calculus of gallbladder without cholecystitis without obstruction: Secondary | ICD-10-CM

## 2023-02-18 LAB — CBC
HCT: 38.1 % (ref 36.0–46.0)
Hemoglobin: 13.1 g/dL (ref 12.0–15.0)
MCH: 30.3 pg (ref 26.0–34.0)
MCHC: 34.4 g/dL (ref 30.0–36.0)
MCV: 88.2 fL (ref 80.0–100.0)
Platelets: 400 10*3/uL (ref 150–400)
RBC: 4.32 MIL/uL (ref 3.87–5.11)
RDW: 13.3 % (ref 11.5–15.5)
WBC: 12.3 10*3/uL — ABNORMAL HIGH (ref 4.0–10.5)
nRBC: 0 % (ref 0.0–0.2)

## 2023-02-18 LAB — COMPREHENSIVE METABOLIC PANEL
ALT: 18 U/L (ref 0–44)
AST: 15 U/L (ref 15–41)
Albumin: 3.9 g/dL (ref 3.5–5.0)
Alkaline Phosphatase: 62 U/L (ref 38–126)
Anion gap: 9 (ref 5–15)
BUN: 5 mg/dL — ABNORMAL LOW (ref 6–20)
CO2: 24 mmol/L (ref 22–32)
Calcium: 9.1 mg/dL (ref 8.9–10.3)
Chloride: 100 mmol/L (ref 98–111)
Creatinine, Ser: 0.69 mg/dL (ref 0.44–1.00)
GFR, Estimated: >60 mL/min (ref >60)
Glucose, Bld: 113 mg/dL — ABNORMAL HIGH (ref 70–99)
Potassium: 3.8 mmol/L (ref 3.5–5.1)
Sodium: 133 mmol/L — ABNORMAL LOW (ref 135–145)
Total Bilirubin: 0.3 mg/dL (ref 0.3–1.2)
Total Protein: 7.5 g/dL (ref 6.5–8.1)

## 2023-02-18 LAB — HCG, SERUM, QUALITATIVE: Preg, Serum: NEGATIVE

## 2023-02-18 LAB — URINALYSIS, W/ REFLEX TO CULTURE (INFECTION SUSPECTED)
Bacteria, UA: NONE SEEN
Bilirubin Urine: NEGATIVE
Glucose, UA: NEGATIVE mg/dL
Hgb urine dipstick: NEGATIVE
Ketones, ur: NEGATIVE mg/dL
Leukocytes,Ua: NEGATIVE
Nitrite: NEGATIVE
Protein, ur: NEGATIVE mg/dL
Specific Gravity, Urine: 1.013 (ref 1.005–1.030)
pH: 7 (ref 5.0–8.0)

## 2023-02-18 LAB — PROTIME-INR
INR: 1 (ref 0.8–1.2)
Prothrombin Time: 13.3 seconds (ref 11.4–15.2)

## 2023-02-18 LAB — TYPE AND SCREEN
ABO/RH(D): O POS
Antibody Screen: NEGATIVE

## 2023-02-18 LAB — LIPASE, BLOOD: Lipase: 22 U/L (ref 11–51)

## 2023-02-18 LAB — TROPONIN I (HIGH SENSITIVITY): Troponin I (High Sensitivity): <2 ng/L (ref <18)

## 2023-02-18 LAB — APTT: aPTT: 24 seconds (ref 24–36)

## 2023-02-18 MED ORDER — BUDESONIDE 0.25 MG/2ML IN SUSP
2.0000 mL | Freq: Two times a day (BID) | RESPIRATORY_TRACT | Status: DC
  Administered 2023-02-19 – 2023-02-20 (×3): 0.25 mg via RESPIRATORY_TRACT
  Filled 2023-02-18 (×4): qty 2

## 2023-02-18 MED ORDER — ACETAMINOPHEN 325 MG PO TABS: 650.0000 mg | ORAL_TABLET | Freq: Four times a day (QID) | ORAL | Status: DC | PRN

## 2023-02-18 MED ORDER — LACTATED RINGERS IV BOLUS
1000.0000 mL | Freq: Once | INTRAVENOUS | Status: AC
  Administered 2023-02-18: 1000 mL via INTRAVENOUS

## 2023-02-18 MED ORDER — LEVOTHYROXINE SODIUM 100 MCG PO TABS
100.0000 ug | ORAL_TABLET | Freq: Every day | ORAL | Status: DC
  Administered 2023-02-20: 100 ug via ORAL
  Filled 2023-02-18: qty 1

## 2023-02-18 MED ORDER — SODIUM CHLORIDE 0.9% FLUSH
3.0000 mL | Freq: Two times a day (BID) | INTRAVENOUS | Status: DC
  Administered 2023-02-18 – 2023-02-20 (×3): 3 mL via INTRAVENOUS

## 2023-02-18 MED ORDER — ONDANSETRON HCL 4 MG/2ML IJ SOLN
4.0000 mg | Freq: Once | INTRAMUSCULAR | Status: AC
  Administered 2023-02-18: 4 mg via INTRAVENOUS
  Filled 2023-02-18: qty 2

## 2023-02-18 MED ORDER — IOHEXOL 300 MG/ML  SOLN
100.0000 mL | Freq: Once | INTRAMUSCULAR | Status: AC | PRN
  Administered 2023-02-18: 100 mL via INTRAVENOUS

## 2023-02-18 MED ORDER — MUPIROCIN 2 % EX OINT
1.0000 | TOPICAL_OINTMENT | Freq: Two times a day (BID) | CUTANEOUS | Status: DC
  Administered 2023-02-19 – 2023-02-20 (×3): 1 via NASAL
  Filled 2023-02-18: qty 22

## 2023-02-18 MED ORDER — ALBUTEROL SULFATE (2.5 MG/3ML) 0.083% IN NEBU: 3.0000 mL | INHALATION_SOLUTION | Freq: Four times a day (QID) | RESPIRATORY_TRACT | Status: DC | PRN

## 2023-02-18 MED ORDER — PANTOPRAZOLE SODIUM 40 MG IV SOLR
40.0000 mg | Freq: Two times a day (BID) | INTRAVENOUS | Status: DC
  Administered 2023-02-18 – 2023-02-20 (×4): 40 mg via INTRAVENOUS
  Filled 2023-02-18 (×4): qty 10

## 2023-02-18 MED ORDER — LACTATED RINGERS IV SOLN: INTRAVENOUS | Status: DC

## 2023-02-18 MED ORDER — SUMATRIPTAN SUCCINATE 50 MG PO TABS: 100.0000 mg | ORAL_TABLET | Freq: Once | ORAL | Status: DC | PRN

## 2023-02-18 MED ORDER — ONDANSETRON HCL 4 MG/2ML IJ SOLN
4.0000 mg | Freq: Four times a day (QID) | INTRAMUSCULAR | Status: DC | PRN
  Administered 2023-02-19: 4 mg via INTRAVENOUS
  Filled 2023-02-18: qty 2

## 2023-02-18 MED ORDER — SODIUM CHLORIDE 0.9 % IV SOLN
3.0000 g | Freq: Four times a day (QID) | INTRAVENOUS | Status: DC
  Administered 2023-02-19 – 2023-02-20 (×6): 3 g via INTRAVENOUS
  Filled 2023-02-18 (×5): qty 8

## 2023-02-18 MED ORDER — PIPERACILLIN-TAZOBACTAM 3.375 G IVPB
3.3750 g | Freq: Three times a day (TID) | INTRAVENOUS | Status: DC
  Administered 2023-02-18: 3.375 g via INTRAVENOUS
  Filled 2023-02-18: qty 50

## 2023-02-18 MED ORDER — FENTANYL CITRATE PF 50 MCG/ML IJ SOSY
50.0000 ug | PREFILLED_SYRINGE | INTRAMUSCULAR | Status: DC | PRN
  Administered 2023-02-18 (×2): 50 ug via INTRAVENOUS
  Filled 2023-02-18 (×2): qty 1

## 2023-02-18 MED ORDER — POLYETHYLENE GLYCOL 3350 17 G PO PACK: 17.0000 g | PACK | Freq: Every day | ORAL | Status: DC | PRN

## 2023-02-18 MED ORDER — ACETAMINOPHEN 650 MG RE SUPP: 650.0000 mg | Freq: Four times a day (QID) | RECTAL | Status: DC | PRN

## 2023-02-18 MED ORDER — OXYCODONE HCL 5 MG PO TABS
5.0000 mg | ORAL_TABLET | ORAL | Status: DC | PRN
  Administered 2023-02-18 – 2023-02-20 (×8): 5 mg via ORAL
  Filled 2023-02-18 (×8): qty 1

## 2023-02-18 MED ORDER — FLUOXETINE HCL 10 MG PO CAPS
10.0000 mg | ORAL_CAPSULE | Freq: Every day | ORAL | Status: DC
  Administered 2023-02-20: 10 mg via ORAL
  Filled 2023-02-18: qty 1

## 2023-02-18 MED ORDER — RIZATRIPTAN BENZOATE 10 MG PO TBDP: 10.0000 mg | ORAL_TABLET | Freq: Once | ORAL | Status: DC | PRN

## 2023-02-18 NOTE — Plan of Care (Signed)
  Problem: Education: Goal: Knowledge of General Education information will improve Description Including pain rating scale, medication(s)/side effects and non-pharmacologic comfort measures Outcome: Progressing   Problem: Health Behavior/Discharge Planning: Goal: Ability to manage health-related needs will improve Outcome: Progressing   

## 2023-02-18 NOTE — ED Provider Notes (Signed)
[place] EMERGENCY DEPARTMENT AT [facility] Provider Note   CSN: [id number] Arrival date & time: 02/18/23  1229     History  Chief Complaint  Patient presents with   Abdominal Pain    Kristin Baker is a 32 y.o. female.   Abdominal Pain Associated symptoms: nausea   Patient presents for abdominal pain and hematochezia.  Medical history includes HTN, migraine headaches, anxiety, depression, HLD, rheumatoid arthritis, anemia.  Yesterday, she underwent endoscopy and colonoscopy with Dr. Tasia Catchings.  During colonoscopy, she had 3 polyps removed.  This was completed yesterday morning.  Throughout the remainder of the day yesterday, she felt fine.  He was able to eat without difficulty.  She woke up early this morning with generalized abdominal pain, most prominent in the upper right aspect.  She felt some abdominal distention to this area as well.  When she would have a bowel movement, there was a large amount of bright red blood.  Over the past 12 hours, she has had repeated episodes of bloody stool.  She has had nausea and decreased p.o. intake.  She has not had any vomiting.  She has had ongoing pain.     Home Medications Prior to Admission medications   Medication Sig Start Date End Date Taking? Authorizing Provider  albuterol (VENTOLIN HFA) 108 (90 Base) MCG/ACT inhaler Inhale 2 puffs into the lungs every 6 (six) hours as needed for wheezing or shortness of breath. 10/23/22  Yes Gilmore Laroche, FNP  amphetamine-dextroamphetamine (ADDERALL XR) 20 MG 24 hr capsule Take 20 mg by mouth daily. 04/02/22  Yes [provider]  amphetamine-dextroamphetamine (ADDERALL) 5 MG tablet Take 5 mg by mouth daily in the afternoon.   Yes [provider]  azelastine (ASTELIN) 0.1 % nasal spray 2 sprays per nostril 1-2 times daily as needed. 07/18/21  Yes [name], MD  Cholecalciferol (VITAMIN D3) 25 MCG (1000 UT) CAPS Take 1 capsule (1,000 Units total) by mouth  daily. 08/27/21  Yes [name], Baird Kay, FNP  FLUoxetine (PROZAC) 10 MG capsule Take 10 mg by mouth daily. 10/22/22  Yes [provider]  Galcanezumab-gnlm (EMGALITY) 120 MG/ML SOAJ Inject 1 pen  into the skin every 30 (thirty) days. 06/12/22  Yes Lomax, Amy, NP  levocetirizine (XYZAL) 5 MG tablet Take 1 tablet (5 mg total) by mouth daily. 09/23/22 02/18/23 Yes Gilmore Laroche, FNP  levothyroxine (SYNTHROID) 100 MCG tablet Take 1 tablet (100 mcg total) by mouth daily. 01/25/23  Yes Gilmore Laroche, FNP  olmesartan-hydrochlorothiazide (BENICAR HCT) 20-12.5 MG tablet TAKE 1 TABLET BY MOUTH DAILY 11/25/22  Yes Gilmore Laroche, FNP  omeprazole (PRILOSEC) 40 MG capsule Take 1 capsule (40 mg total) by mouth daily. 02/01/23  Yes Nanavati, Ankit, MD  psyllium (METAMUCIL) 58.6 % packet Take 1 packet by mouth 2 (two) times daily. 01/22/23 04/22/23 Yes Ahmed, Juanetta Beets, MD  sucralfate (CARAFATE) 1 g tablet Take 1 tablet (1 g total) by mouth 4 (four) times daily -  with meals and at bedtime. 02/01/23  Yes Nanavati, Ankit, MD  tiZANidine (ZANAFLEX) 4 MG tablet TAKE 1 TABLET(4 MG) BY MOUTH AT BEDTIME AS NEEDED FOR MUSCLE SPASMS 02/02/23  Yes Gilmore Laroche, FNP  triamcinolone (NASACORT) 55 MCG/ACT AERO nasal inhaler Place 1 spray into the nose daily. 07/18/21  Yes [name], MD  acetaminophen (TYLENOL) 500 MG tablet Take 1-2 tablets (500-1,000 mg total) by mouth every 6 (six) hours as needed for moderate pain. 02/01/23   Derwood Kaplan, MD  ibuprofen (  ADVIL) 200 MG tablet Take 400 mg by mouth every 6 (six) hours as needed for moderate pain.    [provider]  rizatriptan (MAXALT-MLT) 10 MG disintegrating tablet DISSOLVE ONE TABLET BY MOUTH AS NEEDED FOR MIGRAINE. MAY REPEAT IN 2 HOURS IF NEEDED. NO MORE THAN 2 TABLETS DAILY OR 10 TABLETS A MONTH. 06/12/22   Lomax, Amy, NP      Allergies    Poison ivy extract, Poison oak extract, Anser anser feather (goose) allergy skin test, Flexeril  [cyclobenzaprine], Lactose intolerance (gi), Honeysuckle flower [lonicera], and Mixed grasses    Review of Systems   Review of Systems  Gastrointestinal:  Positive for abdominal distention, abdominal pain, blood in stool and nausea.  All other systems reviewed and are negative.   Physical Exam Updated Vital Signs BP 118/67 (BP Location: Left Arm)   Pulse 70   Temp 98.2 F (36.8 C)   Resp 18   LMP 03/10/2022 (Approximate)   SpO2 100%  Physical Exam Vitals and nursing note reviewed.  Constitutional:      General: She is not in acute distress.    Appearance: She is well-developed. She is not ill-appearing, toxic-appearing or diaphoretic.  HENT:     Head: Normocephalic and atraumatic.     Mouth/Throat:     Mouth: Mucous membranes are moist.  Eyes:     Conjunctiva/sclera: Conjunctivae normal.  Cardiovascular:     Rate and Rhythm: Normal rate and regular rhythm.     Heart sounds: No murmur heard. Pulmonary:     Effort: Pulmonary effort is normal. No respiratory distress.  Abdominal:     General: There is no distension.     Palpations: Abdomen is soft.     Tenderness: There is generalized abdominal tenderness. There is no guarding or rebound.  Musculoskeletal:        General: No swelling.     Cervical back: Neck supple.  Skin:    General: Skin is warm and dry.     Coloration: Skin is not cyanotic, jaundiced or pale.  Neurological:     General: No focal deficit present.     Mental Status: She is alert and oriented to person, place, and time.  Psychiatric:        Mood and Affect: Mood normal.        Behavior: Behavior normal.     ED Results / Procedures / Treatments   Labs (all labs ordered are listed, but only abnormal results are displayed) Labs Reviewed  COMPREHENSIVE METABOLIC PANEL - Abnormal; Notable for the following components:      Result Value   Sodium 133 (*)    Glucose, Bld 113 (*)    BUN 5 (*)    All other components within normal limits  CBC -  Abnormal; Notable for the following components:   WBC 12.3 (*)    All other components within normal limits  URINALYSIS, W/ REFLEX TO CULTURE (INFECTION SUSPECTED) - Abnormal; Notable for the following components:   Color, Urine COLORLESS (*)    All other components within normal limits  CULTURE, BLOOD (ROUTINE X 2)  CULTURE, BLOOD (ROUTINE X 2)  SURGICAL PCR SCREEN  LIPASE, BLOOD  HCG, SERUM, QUALITATIVE  PROTIME-INR  APTT  BASIC METABOLIC PANEL  CBC  TYPE AND SCREEN  TROPONIN I (HIGH SENSITIVITY)    EKG None  Radiology US Abdomen Limited  Result Date: 02/18/2023 CLINICAL DATA:  Right upper quadrant pain EXAM: ULTRASOUND ABDOMEN LIMITED RIGHT UPPER QUADRANT COMPARISON:  CT  abdomen and pelvis 02/18/2023 FINDINGS: Gallbladder: Multiple mobile calcifications are present measuring up to 1.5 cm. The gallbladder wall is mildly thickened measuring 4 mm. There is no pericholecystic fluid. No sonographic Murphy sign noted by sonographer. Common bile duct: Diameter: 5 mm Liver: No focal lesion identified. Increase in parenchymal echogenicity. Portal vein is patent on color Doppler imaging with normal direction of blood flow towards the liver. Other: None. IMPRESSION: 1. Cholelithiasis with mild gallbladder wall thickening. No pericholecystic fluid or sonographic Murphy sign. Findings are equivocal for acute cholecystitis. 2. Increased echogenicity of the hepatic parenchyma is a nonspecific indicator of hepatocellular dysfunction, most commonly steatosis. Electronically Signed   By: Darliss Cheney M.D.   On: 02/18/2023 19:14   CT ABDOMEN PELVIS W CONTRAST  Result Date: 02/18/2023 CLINICAL DATA:  Acute abdominal pain.  Recent colonoscopy. EXAM: CT ABDOMEN AND PELVIS WITH CONTRAST TECHNIQUE: Multidetector CT imaging of the abdomen and pelvis was performed using the standard protocol following bolus administration of intravenous contrast. RADIATION DOSE REDUCTION: This exam was performed according to  the departmental dose-optimization program which includes automated exposure control, adjustment of the mA and/or kV according to patient size and/or use of iterative reconstruction technique. CONTRAST:  OMNIPAQUE IOHEXOL 300 MG/ML  SOLN COMPARISON:  None Available. FINDINGS: Lower chest: No acute abnormality. Hepatobiliary: Gallstones are present. There is gallbladder wall edema/thickening. There is no biliary ductal dilatation. The liver is within normal limits. Pancreas: Unremarkable. No pancreatic ductal dilatation or surrounding inflammatory changes. Spleen: Normal in size without focal abnormality. Adrenals/Urinary Tract: There is a subcentimeter hypodensity in the left kidney which is favored as cysts. Otherwise, the adrenal glands, kidneys and bladder are within normal limits. Bladder distention is noted. Stomach/Bowel: Stomach is within normal limits. Appendix appears normal. No evidence of bowel wall thickening, distention, or inflammatory changes. Vascular/Lymphatic: No significant vascular findings are present. No enlarged abdominal or pelvic lymph nodes. Reproductive: Status post hysterectomy. No adnexal masses. Other: No abdominal wall hernia or abnormality. No abdominopelvic ascites. Musculoskeletal: There are degenerative changes at L5-S1. IMPRESSION: 1. Cholelithiasis with gallbladder wall thickening/edema. Findings are concerning for acute cholecystitis. Consider further evaluation with ultrasound. 2. Bladder distention. 3. Status post hysterectomy 4. Subcentimeter left Bosniak I benign renal cyst. No follow-up imaging is recommended. JACR 2018 Feb; 264-273, Management of the Incidental Renal Mass on CT, [employer] 2021; 814-848, Bosniak Classification of Cystic Renal Masses, Version 2019. Electronically Signed   By: Darliss Cheney M.D.   On: 02/18/2023 17:29    Procedures Procedures    Medications Ordered in ED Medications  pantoprazole (PROTONIX) injection 40 mg (40 mg Intravenous  Given 02/18/23 2154)  lactated ringers infusion ( Intravenous New Bag/Given 02/18/23 2155)  albuterol (PROVENTIL) (2.5 MG/3ML) 0.083% nebulizer solution 3 mL (has no administration in time range)  budesonide (PULMICORT) nebulizer solution 0.25 mg (0.25 mg Nebulization Not Given 02/18/23 2307)  FLUoxetine (PROZAC) capsule 10 mg (10 mg Oral Not Given 02/18/23 2150)  levothyroxine (SYNTHROID) tablet 100 mcg (has no administration in time range)  acetaminophen (TYLENOL) tablet 650 mg (has no administration in time range)    Or  acetaminophen (TYLENOL) suppository 650 mg (has no administration in time range)  polyethylene glycol (MIRALAX / GLYCOLAX) packet 17 g (has no administration in time range)  sodium chloride flush (NS) 0.9 % injection 3 mL (3 mLs Intravenous Given 02/18/23 2157)  oxyCODONE (Oxy IR/ROXICODONE) immediate release tablet 5 mg (5 mg Oral Given 02/18/23 2021)  Ampicillin-Sulbactam (UNASYN) 3 g in sodium chloride 0.9 %  100 mL IVPB (has no administration in time range)  SUMAtriptan (IMITREX) tablet 100 mg (has no administration in time range)  mupirocin ointment (BACTROBAN) 2 % 1 Application (has no administration in time range)  lactated ringers bolus 1,000 mL (0 mLs Intravenous Stopped 02/18/23 1754)  ondansetron (ZOFRAN) injection 4 mg (4 mg Intravenous Given 02/18/23 1529)  iohexol (OMNIPAQUE) 300 MG/ML solution 100 mL (100 mLs Intravenous Contrast Given 02/18/23 1557)  lactated ringers bolus 1,000 mL (0 mLs Intravenous Stopped 02/18/23 2141)    ED Course/ Medical Decision Making/ A&P                                 Medical Decision Making Amount and/or Complexity of Data Reviewed Labs: ordered. Radiology: ordered.  Risk Prescription drug management. Decision regarding hospitalization.   This patient presents to the ED for concern of abdominal pain and hematochezia, this involves an extensive number of treatment options, and is a complaint that carries with it a high risk of  complications and morbidity.  The differential diagnosis includes post polypectomy bleeding, postprocedural complication, intra-abdominal infection, colitis, hemorrhoidal bleed, diverticular bleed   Co morbidities that complicate the patient evaluation  HTN, migraine headaches, anxiety, depression, HLD, rheumatoid arthritis, anemia   Additional history obtained:  Additional history obtained from N/A External records from outside source obtained and reviewed including EMR   Lab Tests:  I Ordered, and personally interpreted labs.  The pertinent results include: No acute drop in hemoglobin.  A leukocytosis is present.  Lab work is otherwise unremarkable.   Imaging Studies ordered:  I ordered imaging studies including CT of abdomen and pelvis, right upper quadrant ultrasound I independently visualized and interpreted imaging which showed CT findings concerning for cholecystitis.  Urinary bladder also found to be distended.  Ultrasound pending at time of admission. I agree with the radiologist interpretation   Cardiac Monitoring: / EKG:  The patient was maintained on a cardiac monitor.  I personally viewed and interpreted the cardiac monitored which showed an underlying rhythm of: Sinus rhythm   Consultations Obtained:  I requested consultation with the general surgeon, Dr. Robyne Peers,  and discussed lab and imaging findings as well as pertinent plan - they recommend: N.p.o. at midnight for possible surgical intervention tomorrow. I requested consultation with the gastroenterologist, Dr. Levon Hedger,  and discussed lab and imaging findings as well as pertinent plan - they recommend: GI will see in consult.   Problem List / ED Course / Critical interventions / Medication management  Patient presenting for 12 hours of generalized abdominal pain, greater on the right aspect and bright red blood in stool.  This does follow endoscopy and colonoscopy yesterday morning.  Vital signs on  arrival are notable for soft blood pressure.  She does take blood pressure medications in the morning and did take this medication approximately 11 hours ago.  She has had poor p.o. intake today.  IV fluids were ordered.  She endorses ongoing pain and nausea.  Zofran and fentanyl were given for symptomatic relief.  Patient's initial lab work does not show any significant drop in her hemoglobin.  There is a leukocytosis.  CT imaging was ordered to assess for possible postprocedural complications.  CT imaging was notable for evidence of inflamed gallbladder as well as a distended bladder.  Right upper quadrant ultrasound was ordered.  Antibiotics were ordered.  Patient urinated after CT scan and postvoid residual bladder scan showed 767  cc of urine retained.  Foley catheter was ordered.  I spoke with general surgeon, Dr. Robyne Peers, who advises admission to hospitalist, n.p.o. at midnight for possible surgical intervention tomorrow.  Given her recent GI procedure and hematochezia today, GI was consulted as well.  GI states that they will see in consult.  They are unlikely to rescope unless she has persistent bleeding.  Patient was admitted for further management. I ordered medication including IV fluids for hydration; fentanyl for analgesia; Zofran for nausea; Zosyn for cholecystitis Reevaluation of the patient after these medicines showed that the patient improved I have reviewed the patients home medicines and have made adjustments as needed   Social Determinants of Health:  Has access to outpatient care        Final Clinical Impression(s) / ED Diagnoses Final diagnoses:  Cholecystitis  Urine retention  Hematochezia    Rx / DC Orders ED Discharge Orders     None         Gloris Manchester, MD 02/18/23 2333

## 2023-02-18 NOTE — ED Triage Notes (Signed)
Pt states she had endoscopy and colonoscopy yesterday.  Pt with abd pain and bright and dark red stools today. Had three polyps removed yesterday as well. Sips of water but denies eating today.

## 2023-02-18 NOTE — ED Notes (Signed)
[place] at bedside at this time

## 2023-02-18 NOTE — Consult Note (Signed)
Pharmacy Antibiotic Note  Bani Um is a 32 y.o. female admitted on 02/18/2023 with acute cholecystitis. Patient with history of recent colonoscopy on 02/17/23  Pharmacy has been consulted for Unasyn dosing.  Plan: Discontinue Zosyn 3.375g IV q8h (4 hour infusion). De-escalate to Unasyn 3g Q6H.   Temp (24hrs), Avg:98.4 F (36.9 C), Min:98.3 F (36.8 C), Max:98.4 F (36.9 C)  Recent Labs  Lab 02/13/23 0913 02/18/23 1356  WBC  --  12.3*  CREATININE 0.80 0.69    CrCl cannot be calculated (Unknown ideal weight.).    Allergies  Allergen Reactions   Poison Ivy Extract Anaphylaxis   Poison Oak Extract Anaphylaxis   Anser Anser Feather (Goose) Allergy Skin Test    Flexeril [Cyclobenzaprine] Hives   Lactose Intolerance (Gi) Diarrhea   Honeysuckle Flower [Lonicera] Rash   Mixed Grasses Rash    Antimicrobials this admission: 9/24 Zosyn >> 9/24   Dose adjustments this admission: NA  Microbiology results: NA  Thank you for allowing pharmacy to be a part of this patient's care.  Effie Shy, PharmD, BCPS Clinical Pharmacist   02/18/2023 8:12 PM

## 2023-02-18 NOTE — ED Notes (Signed)
Patient transported to CT 

## 2023-02-18 NOTE — H&P (Signed)
History and Physical    Patient: Kristin Baker ZOX:[id number] DOB: 1991/05/11 DOA: 02/18/2023 DOS: the patient was seen and examined on 02/18/2023 PCP: Gilmore Laroche, FNP  Patient coming from: Home  Chief Complaint:  Chief Complaint  Patient presents with   Abdominal Pain   HPI: Kristin Baker is a 32 y.o. female with medical history significant of abdominal pain.  As part of the workup patient had a colonoscopy done yesterday with some polyp removal.  Patient apparently did well in the immediate postoperative period.  And was discharged before noon time yesterday.  Patient woke up this morning with epigastric versus right upper quadrant aching pain constant.  And subsequently reports having had bright red blood per rectum.  Pain is ongoing.  Bleeding per rectum has remitted since patient arrival to the ER.  There is no fever or rigors.  No dysuria no other site of bleeding.  Accompanyied by husband tonight. Review of Systems: As mentioned in the history of present illness. All other systems reviewed and are negative. Past Medical History:  Diagnosis Date   Abdominal pain    ADHD    ANA positive    Back pain    DDD (degenerative disc disease), lumbar    Eye pain, right    Fatigue    Frontal sinusitis    Hypertension    Hypothyroidism    Iron deficiency anemia due to chronic blood loss 06/19/2022   Medical history non-contributory    Obesity, morbid (HCC)    Palpitations    Rheumatoid arthritis (HCC)    Past Surgical History:  Procedure Laterality Date   ABLATION     05/22/2020 left side, 06/14/2020 right side    HYSTERECTOMY ABDOMINAL WITH SALPINGECTOMY     ROBOTIC ASSISTED TOTAL HYSTERECTOMY Bilateral 07/30/2022   Procedure: XI ROBOTIC ASSISTED TOTAL HYSTERECTOMY AND BILATERAL SALPINGECTOMY;  Surgeon: Lazaro Arms, MD;  Location: [facility];  Service: Gynecology;  Laterality: Bilateral;   Social History:  reports that she has been smoking cigarettes and e-cigarettes. She  started smoking about 17 years ago. She has a 15 pack-year smoking history. She has been exposed to tobacco smoke. She has never used smokeless tobacco. She reports current alcohol use. She reports that she does not currently use drugs.  Allergies  Allergen Reactions   Poison Ivy Extract Anaphylaxis   Poison Oak Extract Anaphylaxis   Anser Anser Feather (Goose) Allergy Skin Test    Flexeril [Cyclobenzaprine] Hives   Lactose Intolerance (Gi) Diarrhea   Honeysuckle Flower [Lonicera] Rash   Mixed Grasses Rash    Family History  Problem Relation Age of Onset   Cervical cancer Maternal Grandmother    Breast cancer Maternal Grandmother    Skin cancer Maternal Grandmother    Heart attack Maternal Grandfather    Diabetes Father    Heart attack Father    Hyperlipidemia Mother    Peripheral Artery Disease Mother    ADD / ADHD Mother    Bipolar disorder Mother    Cervical cancer Mother        at age 50.   ADD / ADHD Sister    Healthy Daughter    Breast cancer Maternal Aunt    Cervical cancer Maternal Aunt    ADD / ADHD Maternal Aunt    Uterine cancer Maternal Aunt    Breast cancer Maternal Aunt    Multiple sclerosis Paternal Aunt    Colon cancer Neg Hx     Prior to Admission medications   Medication Sig  Start Date End Date Taking? Authorizing Provider  acetaminophen (TYLENOL) 500 MG tablet Take 1-2 tablets (500-1,000 mg total) by mouth every 6 (six) hours as needed for moderate pain. 02/01/23   Derwood Kaplan, MD  albuterol (VENTOLIN HFA) 108 (90 Base) MCG/ACT inhaler Inhale 2 puffs into the lungs every 6 (six) hours as needed for wheezing or shortness of breath. 10/23/22   Gilmore Laroche, FNP  amphetamine-dextroamphetamine (ADDERALL XR) 20 MG 24 hr capsule Take 20 mg by mouth daily. 04/02/22   [provider]  amphetamine-dextroamphetamine (ADDERALL) 5 MG tablet Take 5 mg by mouth daily in the afternoon.    [provider]  azelastine (ASTELIN) 0.1 % nasal spray 2  sprays per nostril 1-2 times daily as needed. 07/18/21   [name], MD  Cholecalciferol (VITAMIN D3) 25 MCG (1000 UT) CAPS Take 1 capsule (1,000 Units total) by mouth daily. 08/27/21   [name], FNP  FLUoxetine (PROZAC) 10 MG capsule Take 10 mg by mouth daily. 10/22/22   [provider]  fluticasone (FLOVENT HFA) 110 MCG/ACT inhaler Inhale 2 puffs into the lungs 2 (two) times daily. With spacer. 07/18/21   [name], MD  Galcanezumab-gnlm [facility]) 120 MG/ML SOAJ Inject 1 pen  into the skin every 30 (thirty) days. 06/12/22   Lomax, Amy, NP  ibuprofen (ADVIL) 200 MG tablet Take 400 mg by mouth every 6 (six) hours as needed for moderate pain.    [provider]  levocetirizine (XYZAL) 5 MG tablet Take 1 tablet (5 mg total) by mouth daily. 09/23/22 01/22/23  Gilmore Laroche, FNP  levothyroxine (SYNTHROID) 100 MCG tablet Take 1 tablet (100 mcg total) by mouth daily. 01/25/23   Gilmore Laroche, FNP  olmesartan-hydrochlorothiazide (BENICAR HCT) 20-12.5 MG tablet TAKE 1 TABLET BY MOUTH DAILY 11/25/22   Gilmore Laroche, FNP  omeprazole (PRILOSEC) 40 MG capsule Take 1 capsule (40 mg total) by mouth daily. 02/01/23   Derwood Kaplan, MD  psyllium (METAMUCIL) 58.6 % packet Take 1 packet by mouth 2 (two) times daily. 01/22/23 04/22/23  Franky Macho, MD  rizatriptan (MAXALT-MLT) 10 MG disintegrating tablet DISSOLVE ONE TABLET BY MOUTH AS NEEDED FOR MIGRAINE. MAY REPEAT IN 2 HOURS IF NEEDED. NO MORE THAN 2 TABLETS DAILY OR 10 TABLETS A MONTH. 06/12/22   Lomax, Amy, NP  sucralfate (CARAFATE) 1 g tablet Take 1 tablet (1 g total) by mouth 4 (four) times daily -  with meals and at bedtime. 02/01/23   Derwood Kaplan, MD  tiZANidine (ZANAFLEX) 4 MG tablet TAKE 1 TABLET(4 MG) BY MOUTH AT BEDTIME AS NEEDED FOR MUSCLE SPASMS 02/02/23   Gilmore Laroche, FNP  triamcinolone (NASACORT) 55 MCG/ACT AERO nasal inhaler Place 1 spray into the nose daily. 07/18/21   [name], MD     Physical Exam: Vitals:   02/18/23 1730 02/18/23 1733 02/18/23 1745 02/18/23 1830  BP: 102/61  105/61 (!) 103/51  Pulse: (!) 55  (!) 59 68  Resp:   19 17  Temp:  98.4 F (36.9 C)    TempSrc:  Oral    SpO2: 100%  99% 98%   General: Obese lady does not appear to be distressed at the moment Respiratory exam: Bilateral intravesicular Cardiovascular exam S1-S2 normal Abdomen all quadrants soft .  There is epigastric versus right upper quadrant tenderness without any inspiratory arrest with deep right subcostal angle palpation Extremities warm without edema.  Data Reviewed:  Labs on Admission:  Results for orders placed or performed during the  hospital encounter of 02/18/23 (from the past 24 hour(s))  Lipase, blood     Status: None   Collection Time: 02/18/23  1:56 PM  Result Value Ref Range   Lipase 22 11 - 51 U/L  Comprehensive metabolic panel     Status: Abnormal   Collection Time: 02/18/23  1:56 PM  Result Value Ref Range   Sodium 133 (L) 135 - 145 mmol/L   Potassium 3.8 3.5 - 5.1 mmol/L   Chloride 100 98 - 111 mmol/L   CO2 24 22 - 32 mmol/L   Glucose, Bld 113 (H) 70 - 99 mg/dL   BUN 5 (L) 6 - 20 mg/dL   Creatinine, Ser 1.61 0.44 - 1.00 mg/dL   Calcium 9.1 8.9 - 09.6 mg/dL   Total Protein 7.5 6.5 - 8.1 g/dL   Albumin 3.9 3.5 - 5.0 g/dL   AST 15 15 - 41 U/L   ALT 18 0 - 44 U/L   Alkaline Phosphatase 62 38 - 126 U/L   Total Bilirubin 0.3 0.3 - 1.2 mg/dL   GFR, Estimated >04 >54 mL/min   Anion gap 9 5 - 15  CBC     Status: Abnormal   Collection Time: 02/18/23  1:56 PM  Result Value Ref Range   WBC 12.3 (H) 4.0 - 10.5 K/uL   RBC 4.32 3.87 - 5.11 MIL/uL   Hemoglobin 13.1 12.0 - 15.0 g/dL   HCT 09.8 11.9 - 14.7 %   MCV 88.2 80.0 - 100.0 fL   MCH 30.3 26.0 - 34.0 pg   MCHC 34.4 30.0 - 36.0 g/dL   RDW 82.9 56.2 - 13.0 %   Platelets 400 150 - 400 K/uL   nRBC 0.0 0.0 - 0.2 %  Type and screen [facility]     Status: None   Collection Time: 02/18/23  1:56 PM   Result Value Ref Range   ABO/RH(D) O POS    Antibody Screen NEG    Sample Expiration      02/21/2023,2359 Performed at [facility], [address], [place], [place] [address]   Urinalysis, w/ Reflex to Culture (Infection Suspected) -Urine, Clean Catch     Status: Abnormal   Collection Time: 02/18/23  6:20 PM  Result Value Ref Range   Specimen Source URINE, CLEAN CATCH    Color, Urine COLORLESS (A) YELLOW   APPearance CLEAR CLEAR   Specific Gravity, Urine 1.013 1.005 - 1.030   pH 7.0 5.0 - 8.0   Glucose, UA NEGATIVE NEGATIVE mg/dL   Hgb urine dipstick NEGATIVE NEGATIVE   Bilirubin Urine NEGATIVE NEGATIVE   Ketones, ur NEGATIVE NEGATIVE mg/dL   Protein, ur NEGATIVE NEGATIVE mg/dL   Nitrite NEGATIVE NEGATIVE   Leukocytes,Ua NEGATIVE NEGATIVE   RBC / HPF 0-5 0 - 5 RBC/hpf   WBC, UA 0-5 0 - 5 WBC/hpf   Bacteria, UA NONE SEEN NONE SEEN   Squamous Epithelial / HPF 0-5 0 - 5 /HPF   Basic Metabolic Panel: Recent Labs  Lab 02/13/23 0913 02/18/23 1356  NA 134* 133*  K 3.9 3.8  CL 99 100  CO2 23 24  GLUCOSE 91 113*  BUN 8 5*  CREATININE 0.80 0.69  CALCIUM 9.0 9.1   Liver Function Tests: Recent Labs  Lab 02/18/23 1356  AST 15  ALT 18  ALKPHOS 62  BILITOT 0.3  PROT 7.5  ALBUMIN 3.9   Recent Labs  Lab 02/18/23 1356  LIPASE 22   No results for input(s): "AMMONIA"  in the last 168 hours. CBC: Recent Labs  Lab 02/18/23 1356  WBC 12.3*  HGB 13.1  HCT 38.1  MCV 88.2  PLT 400   Cardiac Enzymes: No results for input(s): "CKTOTAL", "CKMB", "CKMBINDEX", "TROPONINIHS" in the last 168 hours.  BNP (last 3 results) No results for input(s): "PROBNP" in the last 8760 hours. CBG: No results for input(s): "GLUCAP" in the last 168 hours.  Radiological Exams on Admission:  US Abdomen Limited  Result Date: 02/18/2023 CLINICAL DATA:  Right upper quadrant pain EXAM: ULTRASOUND ABDOMEN LIMITED RIGHT UPPER QUADRANT COMPARISON:  CT abdomen and pelvis 02/18/2023 FINDINGS:  Gallbladder: Multiple mobile calcifications are present measuring up to 1.5 cm. The gallbladder wall is mildly thickened measuring 4 mm. There is no pericholecystic fluid. No sonographic Murphy sign noted by sonographer. Common bile duct: Diameter: 5 mm Liver: No focal lesion identified. Increase in parenchymal echogenicity. Portal vein is patent on color Doppler imaging with normal direction of blood flow towards the liver. Other: None. IMPRESSION: 1. Cholelithiasis with mild gallbladder wall thickening. No pericholecystic fluid or sonographic Murphy sign. Findings are equivocal for acute cholecystitis. 2. Increased echogenicity of the hepatic parenchyma is a nonspecific indicator of hepatocellular dysfunction, most commonly steatosis. Electronically Signed   By: Darliss Cheney M.D.   On: 02/18/2023 19:14   CT ABDOMEN PELVIS W CONTRAST  Result Date: 02/18/2023 CLINICAL DATA:  Acute abdominal pain.  Recent colonoscopy. EXAM: CT ABDOMEN AND PELVIS WITH CONTRAST TECHNIQUE: Multidetector CT imaging of the abdomen and pelvis was performed using the standard protocol following bolus administration of intravenous contrast. RADIATION DOSE REDUCTION: This exam was performed according to the departmental dose-optimization program which includes automated exposure control, adjustment of the mA and/or kV according to patient size and/or use of iterative reconstruction technique. CONTRAST:  OMNIPAQUE IOHEXOL 300 MG/ML  SOLN COMPARISON:  None Available. FINDINGS: Lower chest: No acute abnormality. Hepatobiliary: Gallstones are present. There is gallbladder wall edema/thickening. There is no biliary ductal dilatation. The liver is within normal limits. Pancreas: Unremarkable. No pancreatic ductal dilatation or surrounding inflammatory changes. Spleen: Normal in size without focal abnormality. Adrenals/Urinary Tract: There is a subcentimeter hypodensity in the left kidney which is favored as cysts. Otherwise, the adrenal  glands, kidneys and bladder are within normal limits. Bladder distention is noted. Stomach/Bowel: Stomach is within normal limits. Appendix appears normal. No evidence of bowel wall thickening, distention, or inflammatory changes. Vascular/Lymphatic: No significant vascular findings are present. No enlarged abdominal or pelvic lymph nodes. Reproductive: Status post hysterectomy. No adnexal masses. Other: No abdominal wall hernia or abnormality. No abdominopelvic ascites. Musculoskeletal: There are degenerative changes at L5-S1. IMPRESSION: 1. Cholelithiasis with gallbladder wall thickening/edema. Findings are concerning for acute cholecystitis. Consider further evaluation with ultrasound. 2. Bladder distention. 3. Status post hysterectomy 4. Subcentimeter left Bosniak I benign renal cyst. No follow-up imaging is recommended. JACR 2018 Feb; [id number], Management of the Incidental Renal Mass on CT, [employer] 2021; [id number], Bosniak Classification of Cystic Renal Masses, Version 2019. Electronically Signed   By: Darliss Cheney M.D.   On: 02/18/2023 17:29      Assessment and Plan: No notes have been filed under this hospital service. Service: Hospitalist  Patient Active Problem List   Diagnosis Date Noted   Abdominal pain 02/18/2023   Acute urinary retention 02/18/2023   Gastritis and gastroduodenitis 02/17/2023   Hyperplastic polyp of ascending colon 02/17/2023   Hyperplastic polyp of sigmoid colon 02/17/2023   Hot flashes 01/24/2023   Chronic idiopathic constipation 01/22/2023  Epigastric abdominal tenderness with rebound tenderness 01/22/2023   Bilious vomiting with nausea 01/22/2023   Fecal occult blood test positive 01/22/2023   BMI 45.0-49.9, adult (HCC) 01/22/2023   Mild peripheral edema 10/23/2022   Dysmenorrhea 08/21/2022   Iron deficiency anemia due to chronic blood loss 06/19/2022   Low iron 05/06/2022   Irregular bleeding 04/11/2022   Menometrorrhagia 04/11/2022   Pelvic pain  04/11/2022   Annual physical exam 10/24/2021   Fatigue 10/24/2021   Injury of foot, left, initial encounter 10/11/2021   Left shoulder pain 08/30/2021   Seasonal and perennial allergic rhinitis 07/18/2021   Hyperlipidemia 06/25/2021   H/O multiple allergies 05/14/2021   Anxiety and depression 05/14/2021   Morbid obesity (HCC) 05/14/2021   Hypertension 05/14/2021   Allergies 05/14/2021   Chronic bilateral low back pain without sciatica 05/14/2021   Vitamin D deficiency 01/15/2021   Chronic migraine w/o aura, not intractable, w/o stat migr 01/15/2021   Chronic daily headache 01/15/2021   White matter abnormality on MRI of brain 01/15/2021   Acquired hypothyroidism 07/10/2020   Essential hypertension 07/10/2020   DDD (degenerative disc disease), lumbar 01/11/2020   Patient's abdominal pain is currently attributed to colonoscopy s/p polypectomy that was done yesterday.  Patient's diagnosis of cholecystitis seems if he.  There is no pericholecystic fluid there is no sonographic Murphy sign lipase is normal.  I will proceed with HIDA scan.  I will check a serum hCG just to be 100% sure.  But patient has had cholecystectomy that is documented and also seen on imaging.  In the interim we will draw blood cultures, patient has received Zosyn, will continue with Unasyn.   Patient also reports having some hematochezia since this AM in the context of polyp removal yesterday.  that is currently remitted.  Type and screen has been sent, we will continue with IV fluids and check patient's CBC again in the morning.  Check INR PTT.  Empiric pantoprazole ordered. GI engaged by ER provider.  Urinary retention: Patient developed urinary retention with 750 cc of postvoid residual in the ER.  At this time not entirely clear why may be due to her receiving fentanyl or one of the other medications in the ER.  Foley has been placed.  Outpatient urology evaluation   Medication reconciliation is pending.  Some  medication such as Synthroid and patient's antimigraine medications have been ordered.  Kindly review once home medications are collected by the pharmacy  Pain - oxycodone PRN ordered. Monitor pulse ox for opiate induction   Advance Care Planning:   Code Status: Not on file full code  Consults: surgery on board.  Family Communication: husbad at bedsdie. Allquestions answered.  Severity of Illness: The appropriate patient status for this patient is OBSERVATION. Observation status is judged to be reasonable and necessary in order to provide the required intensity of service to ensure the patient's safety. The patient's presenting symptoms, physical exam findings, and initial radiographic and laboratory data in the context of their medical condition is felt to place them at decreased risk for further clinical deterioration. Furthermore, it is anticipated that the patient will be medically stable for discharge from the hospital within 2 midnights of admission.   Author: Nolberto Hanlon, MD 02/18/2023 7:42 PM  For on call review [email or web address].

## 2023-02-18 NOTE — ED Notes (Signed)
Pt ambulated to the BR without assistance after getting back from CT, pt stated she did urinate. Bladder scanner revealed 767 mL of urine in bladder, pt states "I dont feel like I need to go to the bathroom again"--MD made aware

## 2023-02-18 NOTE — Consult Note (Signed)
Pharmacy Antibiotic Note  Kristin Baker is a 32 y.o. female admitted on 02/18/2023 with acute cholecystitis. Patient with history of recent colonoscopy on 02/17/23  Pharmacy has been consulted for Zosyn dosing.  Plan: Zosyn 3.375g IV q8h (4 hour infusion).     Temp (24hrs), Avg:98.4 F (36.9 C), Min:98.3 F (36.8 C), Max:98.4 F (36.9 C)  Recent Labs  Lab 02/13/23 0913 02/18/23 1356  WBC  --  12.3*  CREATININE 0.80 0.69    CrCl cannot be calculated (Unknown ideal weight.).    Allergies  Allergen Reactions   Poison Ivy Extract Anaphylaxis   Poison Oak Extract Anaphylaxis   Anser Anser Feather (Goose) Allergy Skin Test    Flexeril [Cyclobenzaprine] Hives   Lactose Intolerance (Gi) Diarrhea   Honeysuckle Flower [Lonicera] Rash   Mixed Grasses Rash    Antimicrobials this admission: 9/24 Zosyn >>    Dose adjustments this admission:   Microbiology results:   Thank you for allowing pharmacy to be a part of this patient's care.  Sharen Hones, PharmD, BCPS Clinical Pharmacist   02/18/2023 5:48 PM

## 2023-02-19 ENCOUNTER — Other Ambulatory Visit: Payer: Self-pay

## 2023-02-19 ENCOUNTER — Encounter (HOSPITAL_COMMUNITY)
Admission: EM | Disposition: A | Discharge status: Home / Self Care | Payer: Self-pay | Source: Home / Self Care | Attending: Emergency Medicine

## 2023-02-19 ENCOUNTER — Observation Stay (HOSPITAL_COMMUNITY): Payer: Medicaid Other | Admitting: Certified Registered"

## 2023-02-19 ENCOUNTER — Encounter (HOSPITAL_COMMUNITY): Payer: Self-pay | Admitting: Internal Medicine

## 2023-02-19 ENCOUNTER — Observation Stay (HOSPITAL_BASED_OUTPATIENT_CLINIC_OR_DEPARTMENT_OTHER): Payer: Medicaid Other | Admitting: Certified Registered"

## 2023-02-19 DIAGNOSIS — E039 Hypothyroidism, unspecified: Secondary | ICD-10-CM | POA: Diagnosis not present

## 2023-02-19 DIAGNOSIS — K819 Cholecystitis, unspecified: Principal | ICD-10-CM

## 2023-02-19 DIAGNOSIS — K8020 Calculus of gallbladder without cholecystitis without obstruction: Secondary | ICD-10-CM

## 2023-02-19 DIAGNOSIS — F17210 Nicotine dependence, cigarettes, uncomplicated: Secondary | ICD-10-CM

## 2023-02-19 DIAGNOSIS — R1011 Right upper quadrant pain: Secondary | ICD-10-CM | POA: Diagnosis not present

## 2023-02-19 DIAGNOSIS — K921 Melena: Secondary | ICD-10-CM | POA: Diagnosis not present

## 2023-02-19 DIAGNOSIS — I10 Essential (primary) hypertension: Secondary | ICD-10-CM

## 2023-02-19 DIAGNOSIS — K810 Acute cholecystitis: Secondary | ICD-10-CM | POA: Diagnosis not present

## 2023-02-19 DIAGNOSIS — R338 Other retention of urine: Secondary | ICD-10-CM | POA: Diagnosis not present

## 2023-02-19 DIAGNOSIS — K8012 Calculus of gallbladder with acute and chronic cholecystitis without obstruction: Secondary | ICD-10-CM | POA: Diagnosis not present

## 2023-02-19 LAB — CBC
HCT: 35.8 % — ABNORMAL LOW (ref 36.0–46.0)
Hemoglobin: 11.8 g/dL — ABNORMAL LOW (ref 12.0–15.0)
MCH: 29.9 pg (ref 26.0–34.0)
MCHC: 33 g/dL (ref 30.0–36.0)
MCV: 90.9 fL (ref 80.0–100.0)
Platelets: 324 10*3/uL (ref 150–400)
RBC: 3.94 MIL/uL (ref 3.87–5.11)
RDW: 13.5 % (ref 11.5–15.5)
WBC: 8.7 10*3/uL (ref 4.0–10.5)
nRBC: 0 % (ref 0.0–0.2)

## 2023-02-19 LAB — BASIC METABOLIC PANEL
Anion gap: 9 (ref 5–15)
BUN: <5 mg/dL — ABNORMAL LOW (ref 6–20)
CO2: 27 mmol/L (ref 22–32)
Calcium: 8.2 mg/dL — ABNORMAL LOW (ref 8.9–10.3)
Chloride: 103 mmol/L (ref 98–111)
Creatinine, Ser: 0.79 mg/dL (ref 0.44–1.00)
GFR, Estimated: >60 mL/min (ref >60)
Glucose, Bld: 86 mg/dL (ref 70–99)
Potassium: 3.1 mmol/L — ABNORMAL LOW (ref 3.5–5.1)
Sodium: 139 mmol/L (ref 135–145)

## 2023-02-19 LAB — SURGICAL PCR SCREEN
MRSA, PCR: NOT DETECTED — AB
Staphylococcus aureus: DETECTED — AB

## 2023-02-19 LAB — SURGICAL PATHOLOGY

## 2023-02-19 LAB — MAGNESIUM: Magnesium: 1.8 mg/dL (ref 1.7–2.4)

## 2023-02-19 SURGERY — CHOLECYSTECTOMY, ROBOT-ASSISTED, LAPAROSCOPIC
Anesthesia: General

## 2023-02-19 MED ORDER — ARTIFICIAL TEARS OPHTHALMIC OINT
TOPICAL_OINTMENT | OPHTHALMIC | Status: AC
  Filled 2023-02-19: qty 3.5

## 2023-02-19 MED ORDER — FENTANYL CITRATE (PF) 250 MCG/5ML IJ SOLN
INTRAMUSCULAR | Status: AC
  Filled 2023-02-19: qty 5

## 2023-02-19 MED ORDER — PHENYLEPHRINE 80 MCG/ML (10ML) SYRINGE FOR IV PUSH (FOR BLOOD PRESSURE SUPPORT)
PREFILLED_SYRINGE | INTRAVENOUS | Status: DC | PRN
  Administered 2023-02-19 (×2): 160 ug via INTRAVENOUS

## 2023-02-19 MED ORDER — MIDAZOLAM HCL 2 MG/2ML IJ SOLN
INTRAMUSCULAR | Status: DC | PRN
  Administered 2023-02-19: 2 mg via INTRAVENOUS

## 2023-02-19 MED ORDER — ROCURONIUM BROMIDE 10 MG/ML (PF) SYRINGE
PREFILLED_SYRINGE | INTRAVENOUS | Status: AC
  Filled 2023-02-19: qty 10

## 2023-02-19 MED ORDER — PROPOFOL 500 MG/50ML IV EMUL
INTRAVENOUS | Status: DC | PRN
Start: 2023-02-19 — End: 2023-02-19
  Administered 2023-02-19: 20 ug/kg/min via INTRAVENOUS

## 2023-02-19 MED ORDER — CHLORHEXIDINE GLUCONATE CLOTH 2 % EX PADS: 6.0000 | MEDICATED_PAD | Freq: Once | CUTANEOUS | Status: DC

## 2023-02-19 MED ORDER — MIDAZOLAM HCL 2 MG/2ML IJ SOLN
INTRAMUSCULAR | Status: AC
  Filled 2023-02-19: qty 2

## 2023-02-19 MED ORDER — OXYCODONE HCL 5 MG PO TABS: 5.0000 mg | ORAL_TABLET | Freq: Once | ORAL | Status: DC | PRN

## 2023-02-19 MED ORDER — PHENYLEPHRINE 80 MCG/ML (10ML) SYRINGE FOR IV PUSH (FOR BLOOD PRESSURE SUPPORT)
PREFILLED_SYRINGE | INTRAVENOUS | Status: AC
  Filled 2023-02-19: qty 10

## 2023-02-19 MED ORDER — OXYCODONE HCL 5 MG PO TABS: 5.0000 mg | ORAL_TABLET | Freq: Four times a day (QID) | ORAL | 0 refills | Status: DC | PRN

## 2023-02-19 MED ORDER — OXYCODONE HCL 5 MG/5ML PO SOLN: 5.0000 mg | Freq: Once | ORAL | Status: DC | PRN

## 2023-02-19 MED ORDER — KETOROLAC TROMETHAMINE 30 MG/ML IJ SOLN
INTRAMUSCULAR | Status: AC
  Filled 2023-02-19: qty 1

## 2023-02-19 MED ORDER — DEXMEDETOMIDINE HCL IN NACL 80 MCG/20ML IV SOLN
INTRAVENOUS | Status: DC | PRN
  Administered 2023-02-19 (×2): 8 ug via INTRAVENOUS

## 2023-02-19 MED ORDER — CHLORHEXIDINE GLUCONATE 0.12 % MT SOLN: 15.0000 mL | Freq: Once | OROMUCOSAL | Status: DC

## 2023-02-19 MED ORDER — LACTATED RINGERS IV SOLN: INTRAVENOUS | Status: DC

## 2023-02-19 MED ORDER — SCOPOLAMINE 1 MG/3DAYS TD PT72
1.0000 | MEDICATED_PATCH | Freq: Once | TRANSDERMAL | Status: DC
  Administered 2023-02-19: 1.5 mg via TRANSDERMAL

## 2023-02-19 MED ORDER — LIDOCAINE 2% (20 MG/ML) 5 ML SYRINGE
INTRAMUSCULAR | Status: DC | PRN
  Administered 2023-02-19: 100 mg via INTRAVENOUS

## 2023-02-19 MED ORDER — PROPOFOL 500 MG/50ML IV EMUL
INTRAVENOUS | Status: AC
  Filled 2023-02-19: qty 50

## 2023-02-19 MED ORDER — DEXAMETHASONE SODIUM PHOSPHATE 4 MG/ML IJ SOLN
INTRAMUSCULAR | Status: DC | PRN
  Administered 2023-02-19: 5 mg via INTRAVENOUS

## 2023-02-19 MED ORDER — ROCURONIUM BROMIDE 10 MG/ML (PF) SYRINGE
PREFILLED_SYRINGE | INTRAVENOUS | Status: DC | PRN
  Administered 2023-02-19: 60 mg via INTRAVENOUS

## 2023-02-19 MED ORDER — SCOPOLAMINE 1 MG/3DAYS TD PT72
MEDICATED_PATCH | TRANSDERMAL | Status: DC | PRN
  Administered 2023-02-19: 1 via TRANSDERMAL

## 2023-02-19 MED ORDER — PROPOFOL 10 MG/ML IV BOLUS
INTRAVENOUS | Status: AC
  Filled 2023-02-19: qty 20

## 2023-02-19 MED ORDER — INDOCYANINE GREEN 25 MG IV SOLR: 2.5000 mg | Freq: Once | INTRAVENOUS | Status: AC

## 2023-02-19 MED ORDER — INDOCYANINE GREEN 25 MG IV SOLR
INTRAVENOUS | Status: AC
  Administered 2023-02-19: 2.5 mg via INTRAVENOUS
  Filled 2023-02-19: qty 10

## 2023-02-19 MED ORDER — ONDANSETRON HCL 4 MG PO TABS: 4.0000 mg | ORAL_TABLET | Freq: Every day | ORAL | 1 refills | Status: DC | PRN

## 2023-02-19 MED ORDER — ONDANSETRON HCL 4 MG/2ML IJ SOLN
INTRAMUSCULAR | Status: DC | PRN
  Administered 2023-02-19: 4 mg via INTRAVENOUS

## 2023-02-19 MED ORDER — DOCUSATE SODIUM 100 MG PO CAPS
100.0000 mg | ORAL_CAPSULE | Freq: Two times a day (BID) | ORAL | 2 refills | Status: DC
Start: 2023-02-19 — End: 2023-06-24

## 2023-02-19 MED ORDER — ONDANSETRON HCL 4 MG/2ML IJ SOLN
INTRAMUSCULAR | Status: AC
  Filled 2023-02-19: qty 2

## 2023-02-19 MED ORDER — SCOPOLAMINE 1 MG/3DAYS TD PT72
MEDICATED_PATCH | TRANSDERMAL | Status: AC
  Filled 2023-02-19: qty 1

## 2023-02-19 MED ORDER — SUGAMMADEX SODIUM 200 MG/2ML IV SOLN
INTRAVENOUS | Status: DC | PRN
  Administered 2023-02-19: 300 mg via INTRAVENOUS

## 2023-02-19 MED ORDER — KETOROLAC TROMETHAMINE 30 MG/ML IJ SOLN
INTRAMUSCULAR | Status: DC | PRN
  Administered 2023-02-19: 30 mg via INTRAVENOUS

## 2023-02-19 MED ORDER — CHLORHEXIDINE GLUCONATE CLOTH 2 % EX PADS
6.0000 | MEDICATED_PAD | Freq: Every day | CUTANEOUS | Status: DC
  Administered 2023-02-20: 6 via TOPICAL

## 2023-02-19 MED ORDER — ONDANSETRON HCL 4 MG/2ML IJ SOLN: 4.0000 mg | Freq: Once | INTRAMUSCULAR | Status: DC | PRN

## 2023-02-19 MED ORDER — ACETAMINOPHEN 500 MG PO TABS: 1000.0000 mg | ORAL_TABLET | Freq: Four times a day (QID) | ORAL | 0 refills | Status: AC

## 2023-02-19 MED ORDER — STERILE WATER FOR IRRIGATION IR SOLN
Status: DC | PRN
  Administered 2023-02-19: 500 mL

## 2023-02-19 MED ORDER — FENTANYL CITRATE (PF) 250 MCG/5ML IJ SOLN
INTRAMUSCULAR | Status: DC | PRN
  Administered 2023-02-19 (×3): 50 ug via INTRAVENOUS

## 2023-02-19 MED ORDER — ORAL CARE MOUTH RINSE: 15.0000 mL | Freq: Once | OROMUCOSAL | Status: DC

## 2023-02-19 MED ORDER — BUPIVACAINE HCL (PF) 0.5 % IJ SOLN
INTRAMUSCULAR | Status: AC
  Filled 2023-02-19: qty 30

## 2023-02-19 MED ORDER — LIDOCAINE HCL (PF) 2 % IJ SOLN
INTRAMUSCULAR | Status: AC
  Filled 2023-02-19: qty 5

## 2023-02-19 MED ORDER — ACETAMINOPHEN 10 MG/ML IV SOLN
INTRAVENOUS | Status: DC | PRN
Start: 2023-02-19 — End: 2023-02-19
  Administered 2023-02-19: 1000 mg via INTRAVENOUS

## 2023-02-19 MED ORDER — SODIUM CHLORIDE 0.9 % IV SOLN: INTRAVENOUS | Status: DC | PRN

## 2023-02-19 MED ORDER — POTASSIUM CHLORIDE 10 MEQ/100ML IV SOLN
10.0000 meq | INTRAVENOUS | Status: DC
  Administered 2023-02-19 (×3): 10 meq via INTRAVENOUS
  Filled 2023-02-19 (×3): qty 100

## 2023-02-19 MED ORDER — FENTANYL CITRATE PF 50 MCG/ML IJ SOSY
25.0000 ug | PREFILLED_SYRINGE | INTRAMUSCULAR | Status: DC | PRN
  Administered 2023-02-19 (×2): 50 ug via INTRAVENOUS
  Filled 2023-02-19 (×2): qty 1

## 2023-02-19 MED ORDER — PROPOFOL 10 MG/ML IV BOLUS
INTRAVENOUS | Status: DC | PRN
  Administered 2023-02-19: 200 mg via INTRAVENOUS

## 2023-02-19 MED ORDER — BUPIVACAINE HCL (PF) 0.5 % IJ SOLN
INTRAMUSCULAR | Status: DC | PRN
  Administered 2023-02-19: 30 mL

## 2023-02-19 SURGICAL SUPPLY — 44 items
ADH SKN CLS APL DERMABOND .7 (GAUZE/BANDAGES/DRESSINGS) ×1
APL PRP STRL LF DISP 70% ISPRP (MISCELLANEOUS) ×1
BLADE SURG 15 STRL LF DISP TIS (BLADE) ×1 IMPLANT
BLADE SURG 15 STRL SS (BLADE) ×1
CAUTERY HOOK MNPLR 1.6 DVNC XI (INSTRUMENTS) ×1 IMPLANT
CHLORAPREP W/TINT 26 (MISCELLANEOUS) ×1 IMPLANT
CLIP LIGATING HEM O LOK PURPLE (MISCELLANEOUS) ×1 IMPLANT
COVER TIP SHEARS 8 DVNC (MISCELLANEOUS) IMPLANT
DEFOGGER SCOPE WARMER CLEARIFY (MISCELLANEOUS) ×1 IMPLANT
DERMABOND ADVANCED .7 DNX12 (GAUZE/BANDAGES/DRESSINGS) ×1 IMPLANT
DRAPE ARM DVNC X/XI (DISPOSABLE) ×4 IMPLANT
DRAPE COLUMN DVNC XI (DISPOSABLE) ×1 IMPLANT
ELECT REM PT RETURN 9FT ADLT (ELECTROSURGICAL) ×1
ELECTRODE REM PT RTRN 9FT ADLT (ELECTROSURGICAL) ×1 IMPLANT
FORCEPS BPLR R/ABLATION 8 DVNC (INSTRUMENTS) ×1 IMPLANT
FORCEPS PROGRASP DVNC XI (FORCEP) ×1 IMPLANT
GLOVE BIOGEL PI IND STRL 6.5 (GLOVE) ×2 IMPLANT
GLOVE BIOGEL PI IND STRL 7.0 (GLOVE) ×3 IMPLANT
GLOVE SURG SS PI 6.5 STRL IVOR (GLOVE) ×2 IMPLANT
GOWN STRL REUS W/TWL LRG LVL3 (GOWN DISPOSABLE) ×3 IMPLANT
GRASPER SUT TROCAR 14GX15 (MISCELLANEOUS) ×1 IMPLANT
IRRIGATOR SUCT 8 DISP DVNC XI (IRRIGATION / IRRIGATOR) IMPLANT
KIT TURNOVER KIT A (KITS) ×1 IMPLANT
MANIFOLD NEPTUNE II (INSTRUMENTS) ×1 IMPLANT
NDL HYPO 21X1.5 SAFETY (NEEDLE) ×1 IMPLANT
NDL INSUFFLATION 14GA 120MM (NEEDLE) ×1 IMPLANT
NEEDLE HYPO 21X1.5 SAFETY (NEEDLE) ×1 IMPLANT
NEEDLE INSUFFLATION 14GA 120MM (NEEDLE) ×1 IMPLANT
OBTURATOR OPTICAL STND 8 DVNC (TROCAR) ×1
OBTURATOR OPTICALSTD 8 DVNC (TROCAR) ×1 IMPLANT
PACK LAP CHOLE LZT030E (CUSTOM PROCEDURE TRAY) ×1 IMPLANT
PAD ARMBOARD 7.5X6 YLW CONV (MISCELLANEOUS) ×1 IMPLANT
PENCIL HANDSWITCHING (ELECTRODE) IMPLANT
POSITIONER HEAD 8X9X4 ADT (SOFTGOODS) ×1 IMPLANT
SCISSORS MNPLR CVD DVNC XI (INSTRUMENTS) IMPLANT
SEAL UNIV 5-12 XI (MISCELLANEOUS) ×3 IMPLANT
SET BASIN LINEN APH (SET/KITS/TRAYS/PACK) ×1 IMPLANT
SET TUBE SMOKE EVAC HIGH FLOW (TUBING) ×1 IMPLANT
SUT MNCRL AB 4-0 PS2 18 (SUTURE) ×2 IMPLANT
SUT VICRYL 0 AB UR-6 (SUTURE) IMPLANT
SYR 30ML LL (SYRINGE) ×1 IMPLANT
SYS RETRIEVAL 5MM INZII UNIV (BASKET) ×1
SYSTEM RETRIEVL 5MM INZII UNIV (BASKET) IMPLANT
WATER STERILE IRR 500ML POUR (IV SOLUTION) ×1 IMPLANT

## 2023-02-19 NOTE — Plan of Care (Signed)
  Problem: Education: Goal: Knowledge of General Education information will improve Description Including pain rating scale, medication(s)/side effects and non-pharmacologic comfort measures Outcome: Progressing   Problem: Health Behavior/Discharge Planning: Goal: Ability to manage health-related needs will improve Outcome: Progressing   

## 2023-02-19 NOTE — Consult Note (Signed)
[facility] Consult  Reason for Consult: Concern for acute cholecystitis Referring Physician: Dr. Durwin Nora  Chief Complaint   Abdominal Pain     HPI: Kristin Baker is a 32 y.o. female who presented to the hospital with a 1 day history of epigastric and right upper quadrant abdominal pain.  The pain started after she ate a meal from cookout.  She states that she underwent a colonoscopy earlier this week, and initially thought that the pain was related to gas pains.  She has had similar pains in the past, but always thought that this was related to her GI issues.  She confirms feeling a little nauseous without any episodes of emesis.  Her colonoscopy this week demonstrated 3 sessile polyps which were removed.  She was complaining of some rectal bleeding when she initially came into the hospital.  In the ED, she was noted to be hemodynamically stable.  She underwent a CT of the abdomen and pelvis which demonstrated cholelithiasis with gallbladder wall thickening/edema, concerning for cholecystitis but consider further evaluation with ultrasound.  Ultrasound was ordered and demonstrated cholelithiasis with mild gallbladder wall thickening without pericholecystic fluid or sonographic Murphy's; equivocal for acute cholecystitis.  She had a mild leukocytosis of 12.3 which has subsequently normalized to 8.7.  She had no elevation in her LFTs.  This morning, she is still having epigastric/right upper quadrant abdominal pain, and she has not received any pain medications this morning, as the admitting physician had ordered a HIDA scan for her.  Past Medical History:  Diagnosis Date   Abdominal pain    ADHD    ANA positive    Back pain    DDD (degenerative disc disease), lumbar    Eye pain, right    Fatigue    Frontal sinusitis    Hypertension    Hypothyroidism    Iron deficiency anemia due to chronic blood loss 06/19/2022   Medical history non-contributory    Obesity, morbid  (HCC)    Palpitations    Rheumatoid arthritis (HCC)     Past Surgical History:  Procedure Laterality Date   ABLATION     05/22/2020 left side, 06/14/2020 right side    HYSTERECTOMY ABDOMINAL WITH SALPINGECTOMY     ROBOTIC ASSISTED TOTAL HYSTERECTOMY Bilateral 07/30/2022   Procedure: XI ROBOTIC ASSISTED TOTAL HYSTERECTOMY AND BILATERAL SALPINGECTOMY;  Surgeon: Lazaro Arms, MD;  Location: [facility];  Service: Gynecology;  Laterality: Bilateral;    Family History  Problem Relation Age of Onset   Cervical cancer Maternal Grandmother    Breast cancer Maternal Grandmother    Skin cancer Maternal Grandmother    Heart attack Maternal Grandfather    Diabetes Father    Heart attack Father    Hyperlipidemia Mother    Peripheral Artery Disease Mother    ADD / ADHD Mother    Bipolar disorder Mother    Cervical cancer Mother        at age 38.   ADD / ADHD Sister    Healthy Daughter    Breast cancer Maternal Aunt    Cervical cancer Maternal Aunt    ADD / ADHD Maternal Aunt    Uterine cancer Maternal Aunt    Breast cancer Maternal Aunt    Multiple sclerosis Paternal Aunt    Colon cancer Neg Hx     Social History   Tobacco Use   Smoking status: Every Day    Current packs/day: 0.00    Average packs/day: 1 pack/day for 15.0 years (  15.0 ttl pk-yrs)    Types: Cigarettes, E-cigarettes    Start date: 11/24/2005    Last attempt to quit: 11/24/2020    Years since quitting: 2.2    Passive exposure: Current   Smokeless tobacco: Never   Tobacco comments:    She has quit of and on.  Vaping Use   Vaping status: Every Day  Substance Use Topics   Alcohol use: Yes    Comment: occasionally /social events   Drug use: Not Currently    Comment: smoked weed during teen years    Medications: I have reviewed the patient's current medications. Prior to Admission:  Medications Prior to Admission  Medication Sig Dispense Refill Last Dose   albuterol (VENTOLIN HFA) 108 (90 Base) MCG/ACT inhaler Inhale  2 puffs into the lungs every 6 (six) hours as needed for wheezing or shortness of breath. 18 g 1    amphetamine-dextroamphetamine (ADDERALL XR) 20 MG 24 hr capsule Take 20 mg by mouth daily.   02/18/2023   amphetamine-dextroamphetamine (ADDERALL) 5 MG tablet Take 5 mg by mouth daily in the afternoon.   02/18/2023   azelastine (ASTELIN) 0.1 % nasal spray 2 sprays per nostril 1-2 times daily as needed. 30 mL 5 Past Month   Cholecalciferol (VITAMIN D3) 25 MCG (1000 UT) CAPS Take 1 capsule (1,000 Units total) by mouth daily. 30 capsule 3 02/18/2023   FLUoxetine (PROZAC) 10 MG capsule Take 10 mg by mouth daily.   02/18/2023   Galcanezumab-gnlm (EMGALITY) 120 MG/ML SOAJ Inject 1 pen  into the skin every 30 (thirty) days. 3 mL 3 Past Month   levocetirizine (XYZAL) 5 MG tablet Take 1 tablet (5 mg total) by mouth daily. 30 tablet 2 02/18/2023   levothyroxine (SYNTHROID) 100 MCG tablet Take 1 tablet (100 mcg total) by mouth daily. 90 tablet 1 02/18/2023   olmesartan-hydrochlorothiazide (BENICAR HCT) 20-12.5 MG tablet TAKE 1 TABLET BY MOUTH DAILY 90 tablet 0 02/18/2023   omeprazole (PRILOSEC) 40 MG capsule Take 1 capsule (40 mg total) by mouth daily. 30 capsule 0 02/18/2023   psyllium (METAMUCIL) 58.6 % packet Take 1 packet by mouth 2 (two) times daily. 60 packet 2 02/18/2023   sucralfate (CARAFATE) 1 g tablet Take 1 tablet (1 g total) by mouth 4 (four) times daily -  with meals and at bedtime. 90 tablet 0 02/18/2023   tiZANidine (ZANAFLEX) 4 MG tablet TAKE 1 TABLET(4 MG) BY MOUTH AT BEDTIME AS NEEDED FOR MUSCLE SPASMS 30 tablet 0 02/17/2023 at 2100   triamcinolone (NASACORT) 55 MCG/ACT AERO nasal inhaler Place 1 spray into the nose daily. 16.9 mL 5 Past Week   acetaminophen (TYLENOL) 500 MG tablet Take 1-2 tablets (500-1,000 mg total) by mouth every 6 (six) hours as needed for moderate pain. 30 tablet 0    ibuprofen (ADVIL) 200 MG tablet Take 400 mg by mouth every 6 (six) hours as needed for moderate pain.       rizatriptan (MAXALT-MLT) 10 MG disintegrating tablet DISSOLVE ONE TABLET BY MOUTH AS NEEDED FOR MIGRAINE. MAY REPEAT IN 2 HOURS IF NEEDED. NO MORE THAN 2 TABLETS DAILY OR 10 TABLETS A MONTH. 10 tablet 5 Unknown   Scheduled:  [MAR Hold] budesonide  2 mL Nebulization BID   Chlorhexidine Gluconate Cloth  6 each Topical Once   And   Chlorhexidine Gluconate Cloth  6 each Topical Once   [MAR Hold] Chlorhexidine Gluconate Cloth  6 each Topical Daily   [MAR Hold] FLUoxetine  10 mg Oral Daily   [  MAR Hold] levothyroxine  100 mcg Oral Daily   [MAR Hold] mupirocin ointment  1 Application Nasal BID   [MAR Hold] pantoprazole (PROTONIX) IV  40 mg Intravenous Q12H   [MAR Hold] sodium chloride flush  3 mL Intravenous Q12H   Continuous:  [MAR Hold] ampicillin-sulbactam (UNASYN) IV 3 g (02/19/23 0904)   lactated ringers 100 mL/hr at 02/19/23 1004   [MAR Hold] potassium chloride     PRN:[MAR Hold] acetaminophen **OR** [MAR Hold] acetaminophen, [MAR Hold] albuterol, [MAR Hold] ondansetron (ZOFRAN) IV, [MAR Hold] oxyCODONE, [MAR Hold] polyethylene glycol, [MAR Hold] SUMAtriptan  Allergies  Allergen Reactions   Poison Ivy Extract Anaphylaxis   Poison Oak Extract Anaphylaxis   Anser Anser Feather (Goose) Allergy Skin Test    Flexeril [Cyclobenzaprine] Hives   Lactose Intolerance (Gi) Diarrhea   Honeysuckle Flower [Lonicera] Rash   Mixed Grasses Rash     ROS:  Pertinent items are noted in HPI.  Blood pressure (!) 107/57, pulse 78, temperature 97.9 F (36.6 C), temperature source Oral, resp. rate 18, last menstrual period 03/10/2022, SpO2 100%. Physical Exam Vitals reviewed.  Constitutional:      Appearance: She is well-developed.  HENT:     Head: Normocephalic and atraumatic.  Eyes:     Extraocular Movements: Extraocular movements intact.     Pupils: Pupils are equal, round, and reactive to light.  Cardiovascular:     Rate and Rhythm: Normal rate.  Pulmonary:     Effort: Pulmonary effort is  normal.  Abdominal:     Comments: Abdomen soft, nondistended, no percussion tenderness, mild epigastric and right upper quadrant tenderness to palpation; no rigidity, guarding, rebound tenderness; negative Murphy sign  Skin:    General: Skin is warm and dry.  Neurological:     General: No focal deficit present.     Mental Status: She is alert and oriented to person, place, and time.  Psychiatric:        Mood and Affect: Mood normal.        Behavior: Behavior normal.     Results: Results for orders placed or performed during the hospital encounter of 02/18/23 (from the past 48 hour(s))  Lipase, blood     Status: None   Collection Time: 02/18/23  1:56 PM  Result Value Ref Range   Lipase 22 11 - 51 U/L    Comment: Performed at [facility], [address] [place], [place] [address]  Comprehensive metabolic panel     Status: Abnormal   Collection Time: 02/18/23  1:56 PM  Result Value Ref Range   Sodium 133 (L) 135 - 145 mmol/L   Potassium 3.8 3.5 - 5.1 mmol/L   Chloride 100 98 - 111 mmol/L   CO2 24 22 - 32 mmol/L   Glucose, Bld 113 (H) 70 - 99 mg/dL    Comment: Glucose reference range applies only to samples taken after fasting for at least 8 hours.   BUN 5 (L) 6 - 20 mg/dL   Creatinine, Ser 6.04 0.44 - 1.00 mg/dL   Calcium 9.1 8.9 - 54.0 mg/dL   Total Protein 7.5 6.5 - 8.1 g/dL   Albumin 3.9 3.5 - 5.0 g/dL   AST 15 15 - 41 U/L   ALT 18 0 - 44 U/L   Alkaline Phosphatase 62 38 - 126 U/L   Total Bilirubin 0.3 0.3 - 1.2 mg/dL   GFR, Estimated >98 >11 mL/min    Comment: (NOTE) Calculated using the CKD-EPI Creatinine Equation (2021)  Anion gap 9 5 - 15    Comment: Performed at [facility], [address] [place], [place] [address]  CBC     Status: Abnormal   Collection Time: 02/18/23  1:56 PM  Result Value Ref Range   WBC 12.3 (H) 4.0 - 10.5 K/uL   RBC 4.32 3.87 - 5.11 MIL/uL   Hemoglobin 13.1 12.0 - 15.0 g/dL   HCT 47.4 25.9 - 56.3 %   MCV 88.2 80.0 - 100.0 fL   MCH  30.3 26.0 - 34.0 pg   MCHC 34.4 30.0 - 36.0 g/dL   RDW 87.5 64.3 - 32.9 %   Platelets 400 150 - 400 K/uL   nRBC 0.0 0.0 - 0.2 %    Comment: Performed at [facility], [address], [place], [place] [address]  Type and screen [facility]     Status: None   Collection Time: 02/18/23  1:56 PM  Result Value Ref Range   ABO/RH(D) O POS    Antibody Screen NEG    Sample Expiration      02/21/2023,2359 Performed at [facility], [address] [place], [place] [address]   Urinalysis, w/ Reflex to Culture (Infection Suspected) -Urine, Clean Catch     Status: Abnormal   Collection Time: 02/18/23  6:20 PM  Result Value Ref Range   Specimen Source URINE, CLEAN CATCH    Color, Urine COLORLESS (A) YELLOW   APPearance CLEAR CLEAR   Specific Gravity, Urine 1.013 1.005 - 1.030   pH 7.0 5.0 - 8.0   Glucose, UA NEGATIVE NEGATIVE mg/dL   Hgb urine dipstick NEGATIVE NEGATIVE   Bilirubin Urine NEGATIVE NEGATIVE   Ketones, ur NEGATIVE NEGATIVE mg/dL   Protein, ur NEGATIVE NEGATIVE mg/dL   Nitrite NEGATIVE NEGATIVE   Leukocytes,Ua NEGATIVE NEGATIVE   RBC / HPF 0-5 0 - 5 RBC/hpf   WBC, UA 0-5 0 - 5 WBC/hpf    Comment:        Reflex urine culture not performed if WBC <=10, OR if Squamous epithelial cells >5. If Squamous epithelial cells >5 suggest recollection.    Bacteria, UA NONE SEEN NONE SEEN   Squamous Epithelial / HPF 0-5 0 - 5 /HPF    Comment: Performed at [facility], [address], [place], [place] [address]  Culture, blood (Routine X 2) w Reflex to ID Panel     Status: None (Preliminary result)   Collection Time: 02/18/23  8:00 PM   Specimen: Left Antecubital; Blood  Result Value Ref Range   Specimen Description LEFT ANTECUBITAL    Special Requests      BOTTLES DRAWN AEROBIC AND ANAEROBIC Blood Culture adequate volume   Culture      NO GROWTH < 12 HOURS Performed at [facility], [address], [place], [place] [address]    Report Status PENDING   hCG, serum,  qualitative     Status: None   Collection Time: 02/18/23  8:12 PM  Result Value Ref Range   Preg, Serum NEGATIVE NEGATIVE    Comment:        THE SENSITIVITY OF THIS METHODOLOGY IS >10 mIU/mL. Performed at [facility], [address] [place], [place] [address]   Culture, blood (Routine X 2) w Reflex to ID Panel     Status: None (Preliminary result)   Collection Time: 02/18/23  8:12 PM   Specimen: Left Antecubital; Blood  Result Value Ref Range   Specimen Description LEFT ANTECUBITAL    Special Requests  BOTTLES DRAWN AEROBIC AND ANAEROBIC Blood Culture adequate volume   Culture      NO GROWTH < 12 HOURS Performed at [facility], [address] [place], [place] [address]    Report Status PENDING   Troponin I (High Sensitivity)     Status: None   Collection Time: 02/18/23  8:12 PM  Result Value Ref Range   Troponin I (High Sensitivity) <2 <18 ng/L    Comment: (NOTE) Elevated high sensitivity troponin I (hsTnI) values and significant  changes across serial measurements may suggest ACS but many other  chronic and acute conditions are known to elevate hsTnI results.  Refer to the "Links" section for chest pain algorithms and additional  guidance. Performed at [facility], [address] [place], [place] [address]   Protime-INR     Status: None   Collection Time: 02/18/23  8:12 PM  Result Value Ref Range   Prothrombin Time 13.3 11.4 - 15.2 seconds   INR 1.0 0.8 - 1.2    Comment: (NOTE) INR goal varies based on device and disease states. Performed at [facility], [address], [place], [place] [address]   APTT     Status: None   Collection Time: 02/18/23  8:12 PM  Result Value Ref Range   aPTT 24 24 - 36 seconds    Comment: Performed at [facility], [address], [place], [place] [address]  Surgical PCR screen     Status: Abnormal   Collection Time: 02/18/23 11:11 PM   Specimen: Nasal Mucosa; Nasal Swab  Result Value Ref Range   MRSA, PCR NONE DETECTED (A)  NEGATIVE    Comment: RESULT CALLED TO, READ BACK BY AND VERIFIED WITH: S. BROWN AT 0317 ON 09.25.24 BY ADGER J     Staphylococcus aureus DETECTED (A) NEGATIVE    Comment: RESULT CALLED TO, READ BACK BY AND VERIFIED WITH: S. BROWN AT 0317 ON 09.25.24 BY ADGER J  (NOTE) The Xpert SA Assay (FDA approved for NASAL specimens in patients 47 years of age and older), is one component of a comprehensive surveillance program. It is not intended to diagnose infection nor to guide or monitor treatment. Performed at [facility], [address], [place], [place] [address]   Basic metabolic panel     Status: Abnormal   Collection Time: 02/19/23  4:39 AM  Result Value Ref Range   Sodium 139 135 - 145 mmol/L   Potassium 3.1 (L) 3.5 - 5.1 mmol/L   Chloride 103 98 - 111 mmol/L   CO2 27 22 - 32 mmol/L   Glucose, Bld 86 70 - 99 mg/dL    Comment: Glucose reference range applies only to samples taken after fasting for at least 8 hours.   BUN <5 (L) 6 - 20 mg/dL   Creatinine, Ser 5.46 0.44 - 1.00 mg/dL   Calcium 8.2 (L) 8.9 - 10.3 mg/dL   GFR, Estimated >27 >03 mL/min    Comment: (NOTE) Calculated using the CKD-EPI Creatinine Equation (2021)    Anion gap 9 5 - 15    Comment: Performed at [facility], [address] [place], [place] [address]  CBC     Status: Abnormal   Collection Time: 02/19/23  4:39 AM  Result Value Ref Range   WBC 8.7 4.0 - 10.5 K/uL   RBC 3.94 3.87 - 5.11 MIL/uL   Hemoglobin 11.8 (L) 12.0 - 15.0 g/dL   HCT 81.8 (L) 29.9 - 37.1 %   MCV 90.9 80.0 -  100.0 fL   MCH 29.9 26.0 - 34.0 pg   MCHC 33.0 30.0 - 36.0 g/dL   RDW 16.1 09.6 - 04.5 %   Platelets 324 150 - 400 K/uL   nRBC 0.0 0.0 - 0.2 %    Comment: Performed at [facility], [address], [place], [place] [address]  Magnesium     Status: None   Collection Time: 02/19/23  4:39 AM  Result Value Ref Range   Magnesium 1.8 1.7 - 2.4 mg/dL    Comment: Performed at [facility], [address] [place], [place] [address]     US Abdomen Limited  Result Date: 02/18/2023 CLINICAL DATA:  Right upper quadrant pain EXAM: ULTRASOUND ABDOMEN LIMITED RIGHT UPPER QUADRANT COMPARISON:  CT abdomen and pelvis 02/18/2023 FINDINGS: Gallbladder: Multiple mobile calcifications are present measuring up to 1.5 cm. The gallbladder wall is mildly thickened measuring 4 mm. There is no pericholecystic fluid. No sonographic Murphy sign noted by sonographer. Common bile duct: Diameter: 5 mm Liver: No focal lesion identified. Increase in parenchymal echogenicity. Portal vein is patent on color Doppler imaging with normal direction of blood flow towards the liver. Other: None. IMPRESSION: 1. Cholelithiasis with mild gallbladder wall thickening. No pericholecystic fluid or sonographic Murphy sign. Findings are equivocal for acute cholecystitis. 2. Increased echogenicity of the hepatic parenchyma is a nonspecific indicator of hepatocellular dysfunction, most commonly steatosis. Electronically Signed   By: Darliss Cheney M.D.   On: 02/18/2023 19:14   CT ABDOMEN PELVIS W CONTRAST  Result Date: 02/18/2023 CLINICAL DATA:  Acute abdominal pain.  Recent colonoscopy. EXAM: CT ABDOMEN AND PELVIS WITH CONTRAST TECHNIQUE: Multidetector CT imaging of the abdomen and pelvis was performed using the standard protocol following bolus administration of intravenous contrast. RADIATION DOSE REDUCTION: This exam was performed according to the departmental dose-optimization program which includes automated exposure control, adjustment of the mA and/or kV according to patient size and/or use of iterative reconstruction technique. CONTRAST:  OMNIPAQUE IOHEXOL 300 MG/ML  SOLN COMPARISON:  None Available. FINDINGS: Lower chest: No acute abnormality. Hepatobiliary: Gallstones are present. There is gallbladder wall edema/thickening. There is no biliary ductal dilatation. The liver is within normal limits. Pancreas: Unremarkable. No pancreatic ductal dilatation or  surrounding inflammatory changes. Spleen: Normal in size without focal abnormality. Adrenals/Urinary Tract: There is a subcentimeter hypodensity in the left kidney which is favored as cysts. Otherwise, the adrenal glands, kidneys and bladder are within normal limits. Bladder distention is noted. Stomach/Bowel: Stomach is within normal limits. Appendix appears normal. No evidence of bowel wall thickening, distention, or inflammatory changes. Vascular/Lymphatic: No significant vascular findings are present. No enlarged abdominal or pelvic lymph nodes. Reproductive: Status post hysterectomy. No adnexal masses. Other: No abdominal wall hernia or abnormality. No abdominopelvic ascites. Musculoskeletal: There are degenerative changes at L5-S1. IMPRESSION: 1. Cholelithiasis with gallbladder wall thickening/edema. Findings are concerning for acute cholecystitis. Consider further evaluation with ultrasound. 2. Bladder distention. 3. Status post hysterectomy 4. Subcentimeter left Bosniak I benign renal cyst. No follow-up imaging is recommended. JACR 2018 Feb; [id number], Management of the Incidental Renal Mass on CT, [employer] 2021; [id number], Bosniak Classification of Cystic Renal Masses, Version 2019. Electronically Signed   By: Darliss Cheney M.D.   On: 02/18/2023 17:29     Assessment & Plan:  Kristin Baker is a 32 y.o. female who was admitted with concern for possible acute cholecystitis.  Imaging and blood work evaluated by myself  -Given the patient's findings on abdominal ultrasound, she likely has symptomatic cholelithiasis  that was potentially progressing to acute cholecystitis.  Since she is continuing to have pain, I do not see any reason to obtain a HIDA scan this morning.  Will proceed to the operating room for robotic assisted laparoscopic cholecystectomy -I counseled the patient about the indication, risks and benefits of robotic assisted laparoscopic cholecystectomy.  She understands there is a very  small chance for bleeding, infection, injury to normal structures (including common bile duct), conversion to open surgery, persistent symptoms, evolution of postcholecystectomy diarrhea, need for secondary interventions, anesthesia reaction, cardiopulmonary issues and other risks not specifically detailed here. I described the expected recovery, the plan for follow-up and the restrictions during the recovery phase.  All questions were answered. -NPO -Continue IV antibiotics for now -IV fluids per primary team -PRN pain control and antiemetics -Further recommendations to follow surgery -Patient had Foley catheter placed for urinary retention.  Will maintain the Foley catheter until the postoperative period.  Will defer management of Foley to primary team  All questions were answered to the satisfaction of the patient and family.  -- Theophilus Kinds, DO [facility] [address] [place], [place] [address] [phone] (office)

## 2023-02-19 NOTE — Progress Notes (Signed)
   02/19/23 1008  TOC Brief Assessment  Insurance and Status Reviewed  Patient has primary care physician Yes  Home environment has been reviewed Home  Prior level of function: independent  Prior/Current Home Services No current home services  Social Determinants of Health Reivew SDOH reviewed no interventions necessary  Readmission risk has been reviewed Yes  Transition of care needs no transition of care needs at this time   Transition of Care Department [facility]) has reviewed patient and no TOC needs have been identified at this time. We will continue to monitor patient advancement through interdisciplinary progression rounds. If new patient transition needs arise, please place a TOC consult.

## 2023-02-19 NOTE — Progress Notes (Signed)
PROGRESS NOTE   Kristin Baker  UEA:[id number] DOB: [birth year] DOA: 02/18/2023 PCP: Gilmore Laroche, FNP   Chief Complaint  Patient presents with   Abdominal Pain   Level of care: Med-Surg  Brief Admission History:  32 y.o. female with medical history significant of abdominal pain.  As part of the workup patient had a colonoscopy done yesterday with some polyp removal.  Patient apparently did well in the immediate postoperative period.  And was discharged before noon time yesterday.  Patient woke up this morning with epigastric versus right upper quadrant aching pain constant.  And subsequently reports having had bright red blood per rectum.  Pain is ongoing.  Bleeding per rectum has remitted since patient arrival to the ER.  There is no fever or rigors.  No dysuria no other site of bleeding.    Assessment and Plan: Acute cholecystitis  POD#0 s/p lap chole with Dr. Robyne Peers -continue IV antibiotics while in hospital  -advanced diet today - recheck labs in AM  - postop per surgery recs  Abdominal Pain - very minimal postop pain  - symptomatic mgmt   Acute Urinary Retention  - s/p foley placement - remove foley after surgery  - do voiding trial  - order placed to DC foley on 9/25    Consultants:  Surgery  Procedures:  Lap Chole 02/19/23  Antimicrobials:  Amp/sulbactam 9/24>>   Subjective: Pt eager to have gallbladder removed.  Pain controlled.   Objective: Vitals:   02/19/23 1420 02/19/23 1430 02/19/23 1441 02/19/23 1500  BP:  105/78  (!) 112/58  Pulse: (!) 53 81 72 70  Resp: 14 17 18 16   Temp:   97.6 F (36.4 C) 97.6 F (36.4 C)  TempSrc:    Oral  SpO2: 99% 100% 100% 100%  Weight:      Height:        Intake/Output Summary (Last 24 hours) at 02/19/2023 1621 Last data filed at 02/19/2023 1322 Gross per 24 hour  Intake 3107.36 ml  Output 3900 ml  Net -792.64 ml   Filed Weights   02/19/23 0929  Weight: 128.4 kg   Examination:  General exam: Appears  calm and comfortable  Respiratory system: Clear to auscultation. Respiratory effort normal. Cardiovascular system: normal S1 & S2 heard. No JVD, murmurs, rubs, gallops or clicks. No pedal edema. Gastrointestinal system: Abdomen is nondistended, soft and with diffuse RUQ TTP and guarding. No organomegaly or masses felt. Normal bowel sounds heard. Central nervous system: Alert and oriented. No focal neurological deficits. Extremities: Symmetric 5 x 5 power. Skin: No rashes, lesions or ulcers. Psychiatry: Judgement and insight appear normal. Mood & affect appropriate.   Data Reviewed: I have personally reviewed following labs and imaging studies  CBC: Recent Labs  Lab 02/18/23 1356 02/19/23 0439  WBC 12.3* 8.7  HGB 13.1 11.8*  HCT 38.1 35.8*  MCV 88.2 90.9  PLT 400 324    Basic Metabolic Panel: Recent Labs  Lab 02/13/23 0913 02/18/23 1356 02/19/23 0439  NA 134* 133* 139  K 3.9 3.8 3.1*  CL 99 100 103  CO2 23 24 27   GLUCOSE 91 113* 86  BUN 8 5* <5*  CREATININE 0.80 0.69 0.79  CALCIUM 9.0 9.1 8.2*  MG  --   --  1.8    CBG: No results for input(s): "GLUCAP" in the last 168 hours.  Recent Results (from the past 240 hour(s))  Culture, blood (Routine X 2) w Reflex to ID Panel     Status: None (  Preliminary result)   Collection Time: 02/18/23  8:00 PM   Specimen: Left Antecubital; Blood  Result Value Ref Range Status   Specimen Description LEFT ANTECUBITAL  Final   Special Requests   Final    BOTTLES DRAWN AEROBIC AND ANAEROBIC Blood Culture adequate volume   Culture   Final    NO GROWTH < 12 HOURS Performed at [facility], [address] [place], [place] [address]    Report Status PENDING  Incomplete  Culture, blood (Routine X 2) w Reflex to ID Panel     Status: None (Preliminary result)   Collection Time: 02/18/23  8:12 PM   Specimen: Left Antecubital; Blood  Result Value Ref Range Status   Specimen Description LEFT ANTECUBITAL  Final   Special Requests   Final     BOTTLES DRAWN AEROBIC AND ANAEROBIC Blood Culture adequate volume   Culture   Final    NO GROWTH < 12 HOURS Performed at [facility], [address] [place], [place] [address]    Report Status PENDING  Incomplete  Surgical PCR screen     Status: Abnormal   Collection Time: 02/18/23 11:11 PM   Specimen: Nasal Mucosa; Nasal Swab  Result Value Ref Range Status   MRSA, PCR NONE DETECTED (A) NEGATIVE Final    Comment: RESULT CALLED TO, READ BACK BY AND VERIFIED WITH: S. BROWN AT 0317 ON 09.25.24 BY ADGER J     Staphylococcus aureus DETECTED (A) NEGATIVE Final    Comment: RESULT CALLED TO, READ BACK BY AND VERIFIED WITH: S. BROWN AT 0317 ON 09.25.24 BY ADGER J  (NOTE) The Xpert SA Assay (FDA approved for NASAL specimens in patients 53 years of age and older), is one component of a comprehensive surveillance program. It is not intended to diagnose infection nor to guide or monitor treatment. Performed at [facility], [address], [place], [place] [address]      Radiology Studies: US Abdomen Limited  Result Date: 02/18/2023 CLINICAL DATA:  Right upper quadrant pain EXAM: ULTRASOUND ABDOMEN LIMITED RIGHT UPPER QUADRANT COMPARISON:  CT abdomen and pelvis 02/18/2023 FINDINGS: Gallbladder: Multiple mobile calcifications are present measuring up to 1.5 cm. The gallbladder wall is mildly thickened measuring 4 mm. There is no pericholecystic fluid. No sonographic Murphy sign noted by sonographer. Common bile duct: Diameter: 5 mm Liver: No focal lesion identified. Increase in parenchymal echogenicity. Portal vein is patent on color Doppler imaging with normal direction of blood flow towards the liver. Other: None. IMPRESSION: 1. Cholelithiasis with mild gallbladder wall thickening. No pericholecystic fluid or sonographic Murphy sign. Findings are equivocal for acute cholecystitis. 2. Increased echogenicity of the hepatic parenchyma is a nonspecific indicator of hepatocellular dysfunction, most  commonly steatosis. Electronically Signed   By: Darliss Cheney M.D.   On: 02/18/2023 19:14   CT ABDOMEN PELVIS W CONTRAST  Result Date: 02/18/2023 CLINICAL DATA:  Acute abdominal pain.  Recent colonoscopy. EXAM: CT ABDOMEN AND PELVIS WITH CONTRAST TECHNIQUE: Multidetector CT imaging of the abdomen and pelvis was performed using the standard protocol following bolus administration of intravenous contrast. RADIATION DOSE REDUCTION: This exam was performed according to the departmental dose-optimization program which includes automated exposure control, adjustment of the mA and/or kV according to patient size and/or use of iterative reconstruction technique. CONTRAST:  OMNIPAQUE IOHEXOL 300 MG/ML  SOLN COMPARISON:  None Available. FINDINGS: Lower chest: No acute abnormality. Hepatobiliary: Gallstones are present. There is gallbladder wall edema/thickening. There is no biliary ductal dilatation. The liver is  within normal limits. Pancreas: Unremarkable. No pancreatic ductal dilatation or surrounding inflammatory changes. Spleen: Normal in size without focal abnormality. Adrenals/Urinary Tract: There is a subcentimeter hypodensity in the left kidney which is favored as cysts. Otherwise, the adrenal glands, kidneys and bladder are within normal limits. Bladder distention is noted. Stomach/Bowel: Stomach is within normal limits. Appendix appears normal. No evidence of bowel wall thickening, distention, or inflammatory changes. Vascular/Lymphatic: No significant vascular findings are present. No enlarged abdominal or pelvic lymph nodes. Reproductive: Status post hysterectomy. No adnexal masses. Other: No abdominal wall hernia or abnormality. No abdominopelvic ascites. Musculoskeletal: There are degenerative changes at L5-S1. IMPRESSION: 1. Cholelithiasis with gallbladder wall thickening/edema. Findings are concerning for acute cholecystitis. Consider further evaluation with ultrasound. 2. Bladder distention. 3.  Status post hysterectomy 4. Subcentimeter left Bosniak I benign renal cyst. No follow-up imaging is recommended. JACR 2018 Feb; 264-273, Management of the Incidental Renal Mass on CT, [employer] 2021; [id number], Bosniak Classification of Cystic Renal Masses, Version 2019. Electronically Signed   By: Darliss Cheney M.D.   On: 02/18/2023 17:29    Scheduled Meds:  budesonide  2 mL Nebulization BID   Chlorhexidine Gluconate Cloth  6 each Topical Daily   FLUoxetine  10 mg Oral Daily   levothyroxine  100 mcg Oral Daily   mupirocin ointment  1 Application Nasal BID   pantoprazole (PROTONIX) IV  40 mg Intravenous Q12H   sodium chloride flush  3 mL Intravenous Q12H   Continuous Infusions:  sodium chloride 10 mL/hr at 02/19/23 1541   ampicillin-sulbactam (UNASYN) IV 3 g (02/19/23 1543)   lactated ringers 100 mL/hr at 02/19/23 1105     LOS: 0 days   Time spent: 40 mins  Vandell Kun Laural Benes, MD How to contact the [facility] Attending or Consulting provider 7A - 7P or covering provider during after hours 7P -7A, for this patient?  Check the care team in [facility] and look for a) attending/consulting [facility] provider listed and b) the [facility] team listed Log into [email or web address] and use [place]'s universal password to access. If you do not have the password, please contact the hospital operator. Locate the [facility] provider you are looking for under [facility] and page to a number that you can be directly reached. If you still have difficulty reaching the provider, please page the [facility] (Director on Call) for the Hospitalists listed on amion for assistance.  02/19/2023, 4:21 PM

## 2023-02-19 NOTE — Anesthesia Preprocedure Evaluation (Signed)
Anesthesia Evaluation  Patient identified by MRN, date of birth, ID band Patient awake    Reviewed: Allergy & Precautions, H&P , NPO status , Patient's Chart, lab work & pertinent test results, reviewed documented beta blocker date and time   Airway Mallampati: II  TM Distance: >3 FB Neck ROM: full    Dental no notable dental hx.    Pulmonary neg pulmonary ROS, Current Smoker   Pulmonary exam normal breath sounds clear to auscultation       Cardiovascular Exercise Tolerance: Good hypertension,  Rhythm:regular Rate:Normal - Systolic Click    Neuro/Psych  Headaches PSYCHIATRIC DISORDERS Anxiety Depression       GI/Hepatic negative GI ROS, Neg liver ROS,,,  Endo/Other  Hypothyroidism  Morbid obesity  Renal/GU negative Renal ROS  negative genitourinary   Musculoskeletal   Abdominal   Peds  Hematology  (+) Blood dyscrasia, anemia   Anesthesia Other Findings   Reproductive/Obstetrics negative OB ROS                             Anesthesia Physical Anesthesia Plan  ASA: 3  Anesthesia Plan: General and General ETT   Post-op Pain Management:    Induction:   PONV Risk Score and Plan: Scopolamine patch - Pre-op and Ondansetron  Airway Management Planned:   Additional Equipment:   Intra-op Plan:   Post-operative Plan:   Informed Consent: I have reviewed the patients History and Physical, chart, labs and discussed the procedure including the risks, benefits and alternatives for the proposed anesthesia with the patient or authorized representative who has indicated his/her understanding and acceptance.     Dental Advisory Given  Plan Discussed with: CRNA  Anesthesia Plan Comments:        Anesthesia Quick Evaluation

## 2023-02-19 NOTE — Consult Note (Signed)
Kristin Baker, M.D. Gastroenterology & Hepatology                                           Patient Name: Kristin Baker Account #: @FLAACCTNO @   MRN: [id number] Admission Date: 02/18/2023 Date of Evaluation:  02/19/2023 Time of Evaluation: 2:11 PM   Referring Physician: Standley Dakins, MD  Chief Complaint: Rectal bleeding and cholecystitis  HPI:  This is a 32 y.o. female with history of ADHD, degenerative disc disease, hypertension, hypothyroidism, iron deficiency anemia, rheumatoid arthritis, who was admitted to the hospital after presenting worsening abdominal pain and rectal bleeding.  Patient underwent a esophagogastroduodenospy and colonoscopy on 02/17/2023.  Esophagogastroduodenospy showed normal esophagus, gastritis and scalloped mucosa in the duodenum.  Stomach and duodenum were biopsied.  Colonoscopy showed 3 polyps in the rectum and the sigmoid colon between 3 to 7 mm, removed with a cold snare, presence of external/internal hemorrhoids.  Pathology report is pending.  Patient reports that today after her colonoscopy she presented severe abdominal pain in her right upper quadrant that did not improve with rest.  She initially thought this was related to trapped gas after her colonoscopy but as symptoms persisted she decided to come to the hospital for further evaluation.  States that she had similar pain for the last few weeks in the right upper quadrant but improved with use of PPI and Carafate.  She denies having any vomiting but had some nausea.  Notably, the patient also reported having some episode of rectal bleeding after her colonoscopy which were large in amount.  Had a total of 4 episodes of blood in her stool.    In the ED, she was HD stable and afebrile. Labs were remarkable for sodium 133, normal liver and renal function.  CBC showed a WBC of 12.3 with rest of cell lines within normal limits and hemoglobin of 13.1.  CT of the abdomen and pelvis with IV contrast showed  cholelithiasis with gallbladder thickening concerning for cholecystitis.  Right upper quadrant ultrasound showed cholelithiasis with mild gallbladder thickening.  Hemoglobin today decreased to 11.8.States her rectal bleeding has stopped since she has been admitted to the hospital.  General surgery was consulted and patient was scheduled for cholecystectomy today.  Past Medical History: SEE CHRONIC ISSSUES: Past Medical History:  Diagnosis Date   Abdominal pain    ADHD    ANA positive    Back pain    DDD (degenerative disc disease), lumbar    Eye pain, right    Fatigue    Frontal sinusitis    Hypertension    Hypothyroidism    Iron deficiency anemia due to chronic blood loss 06/19/2022   Medical history non-contributory    Obesity, morbid (HCC)    Palpitations    Rheumatoid arthritis (HCC)    Past Surgical History:  Past Surgical History:  Procedure Laterality Date   ABLATION     05/22/2020 left side, 06/14/2020 right side    HYSTERECTOMY ABDOMINAL WITH SALPINGECTOMY     ROBOTIC ASSISTED TOTAL HYSTERECTOMY Bilateral 07/30/2022   Procedure: XI ROBOTIC ASSISTED TOTAL HYSTERECTOMY AND BILATERAL SALPINGECTOMY;  Surgeon: Lazaro Arms, MD;  Location: [facility];  Service: Gynecology;  Laterality: Bilateral;   Family History:  Family History  Problem Relation Age of Onset   Cervical cancer Maternal Grandmother    Breast cancer Maternal Grandmother    Skin cancer Maternal  Grandmother    Heart attack Maternal Grandfather    Diabetes Father    Heart attack Father    Hyperlipidemia Mother    Peripheral Artery Disease Mother    ADD / ADHD Mother    Bipolar disorder Mother    Cervical cancer Mother        at age 28.   ADD / ADHD Sister    Healthy Daughter    Breast cancer Maternal Aunt    Cervical cancer Maternal Aunt    ADD / ADHD Maternal Aunt    Uterine cancer Maternal Aunt    Breast cancer Maternal Aunt    Multiple sclerosis Paternal Aunt    Colon cancer Neg Hx    Social  History:  Social History   Tobacco Use   Smoking status: Every Day    Current packs/day: 0.00    Average packs/day: 1 pack/day for 15.0 years (15.0 ttl pk-yrs)    Types: Cigarettes, E-cigarettes    Start date: 11/24/2005    Last attempt to quit: 11/24/2020    Years since quitting: 2.2    Passive exposure: Current   Smokeless tobacco: Never   Tobacco comments:    She has quit of and on.  Vaping Use   Vaping status: Every Day  Substance Use Topics   Alcohol use: Yes    Comment: occasionally /social events   Drug use: Not Currently    Comment: smoked weed during teen years    Home Medications:  Prior to Admission medications   Medication Sig Start Date End Date Taking? Authorizing Provider  albuterol (VENTOLIN HFA) 108 (90 Base) MCG/ACT inhaler Inhale 2 puffs into the lungs every 6 (six) hours as needed for wheezing or shortness of breath. 10/23/22  Yes Gilmore Laroche, FNP  amphetamine-dextroamphetamine (ADDERALL XR) 20 MG 24 hr capsule Take 20 mg by mouth daily. 04/02/22  Yes [provider]  amphetamine-dextroamphetamine (ADDERALL) 5 MG tablet Take 5 mg by mouth daily in the afternoon.   Yes [provider]  azelastine (ASTELIN) 0.1 % nasal spray 2 sprays per nostril 1-2 times daily as needed. 07/18/21  Yes [name], MD  Cholecalciferol (VITAMIN D3) 25 MCG (1000 UT) CAPS Take 1 capsule (1,000 Units total) by mouth daily. 08/27/21  Yes [name], Baird Kay, FNP  docusate sodium (COLACE) 100 MG capsule Take 1 capsule (100 mg total) by mouth 2 (two) times daily. 02/19/23 02/19/24 Yes Pappayliou, Santina Evans A, DO  FLUoxetine (PROZAC) 10 MG capsule Take 10 mg by mouth daily. 10/22/22  Yes [provider]  Galcanezumab-gnlm (EMGALITY) 120 MG/ML SOAJ Inject 1 pen  into the skin every 30 (thirty) days. 06/12/22  Yes Lomax, Amy, NP  levocetirizine (XYZAL) 5 MG tablet Take 1 tablet (5 mg total) by mouth daily. 09/23/22 02/18/23 Yes Gilmore Laroche, FNP  levothyroxine  (SYNTHROID) 100 MCG tablet Take 1 tablet (100 mcg total) by mouth daily. 01/25/23  Yes Gilmore Laroche, FNP  olmesartan-hydrochlorothiazide (BENICAR HCT) 20-12.5 MG tablet TAKE 1 TABLET BY MOUTH DAILY 11/25/22  Yes Gilmore Laroche, FNP  omeprazole (PRILOSEC) 40 MG capsule Take 1 capsule (40 mg total) by mouth daily. 02/01/23  Yes Derwood Kaplan, MD  ondansetron (ZOFRAN) 4 MG tablet Take 1 tablet (4 mg total) by mouth daily as needed for nausea or vomiting. 02/19/23 02/19/24 Yes Pappayliou, Santina Evans A, DO  oxyCODONE (ROXICODONE) 5 MG immediate release tablet Take 1 tablet (5 mg total) by mouth every 6 (six) hours as needed. 02/19/23  Yes Pappayliou, Gustavus Messing,  DO  psyllium (METAMUCIL) 58.6 % packet Take 1 packet by mouth 2 (two) times daily. 01/22/23 04/22/23 Yes Ahmed, Juanetta Beets, MD  sucralfate (CARAFATE) 1 g tablet Take 1 tablet (1 g total) by mouth 4 (four) times daily -  with meals and at bedtime. 02/01/23  Yes Nanavati, Ankit, MD  tiZANidine (ZANAFLEX) 4 MG tablet TAKE 1 TABLET(4 MG) BY MOUTH AT BEDTIME AS NEEDED FOR MUSCLE SPASMS 02/02/23  Yes Gilmore Laroche, FNP  triamcinolone (NASACORT) 55 MCG/ACT AERO nasal inhaler Place 1 spray into the nose daily. 07/18/21  Yes [name], MD  acetaminophen (TYLENOL) 500 MG tablet Take 2 tablets (1,000 mg total) by mouth every 6 (six) hours. 02/19/23   Pappayliou, Santina Evans A, DO  ibuprofen (ADVIL) 200 MG tablet Take 400 mg by mouth every 6 (six) hours as needed for moderate pain.    [provider]  rizatriptan (MAXALT-MLT) 10 MG disintegrating tablet DISSOLVE ONE TABLET BY MOUTH AS NEEDED FOR MIGRAINE. MAY REPEAT IN 2 HOURS IF NEEDED. NO MORE THAN 2 TABLETS DAILY OR 10 TABLETS A MONTH. 06/12/22   Lomax, Amy, NP    Inpatient Medications:  Current Facility-Administered Medications:    [MAR Hold] acetaminophen (TYLENOL) tablet 650 mg, 650 mg, Oral, Q6H PRN **OR** [MAR Hold] acetaminophen (TYLENOL) suppository 650 mg, 650 mg, Rectal, Q6H PRN, Nolberto Hanlon, MD   [MAR Hold] albuterol (PROVENTIL) (2.5 MG/3ML) 0.083% nebulizer solution 3 mL, 3 mL, Nebulization, Q6H PRN, Nolberto Hanlon, MD   [facility] Hold] Ampicillin-Sulbactam (UNASYN) 3 g in sodium chloride 0.9 % 100 mL IVPB, 3 g, Intravenous, Q6H, Effie Shy, RPH, Last Rate: 200 mL/hr at 02/19/23 0904, 3 g at 02/19/23 0904   [MAR Hold] budesonide (PULMICORT) nebulizer solution 0.25 mg, 2 mL, Nebulization, BID, Nolberto Hanlon, MD, 0.25 mg at 02/19/23 0902   bupivacaine(PF) (MARCAINE) 0.5 % injection, , , PRN, Pappayliou, Catherine A, DO, 30 mL at 02/19/23 1258   chlorhexidine (PERIDEX) 0.12 % solution 15 mL, 15 mL, Mouth/Throat, Once **OR** Oral care mouth rinse, 15 mL, Mouth Rinse, Once, Kiel, Mosetta Putt, MD   6 CHG cloth bath night before surgery, , , Once **AND** [START ON 02/20/2023] 6 CHG cloth bath AM of surgery, , , Once **AND** Chlorhexidine Gluconate Cloth 2 % PADS 6 each, 6 each, Topical, Once **AND** Chlorhexidine Gluconate Cloth 2 % PADS 6 each, 6 each, Topical, Once, Pappayliou, Catherine A, DO   [MAR Hold] Chlorhexidine Gluconate Cloth 2 % PADS 6 each, 6 each, Topical, Daily, Johnson, Clanford L, MD   fentaNYL (SUBLIMAZE) injection 25-50 mcg, 25-50 mcg, Intravenous, Q5 min PRN, Johnnette Litter, Mosetta Putt, MD, 50 mcg at 02/19/23 1355   [MAR Hold] FLUoxetine (PROZAC) capsule 10 mg, 10 mg, Oral, Daily, Nolberto Hanlon, MD   lactated ringers infusion, , Intravenous, Continuous, Nolberto Hanlon, MD, Last Rate: 100 mL/hr at 02/19/23 1105, New Bag at 02/19/23 1226   lactated ringers infusion, , Intravenous, Continuous, Kiel, Mosetta Putt, MD   [MAR Hold] levothyroxine (SYNTHROID) tablet 100 mcg, 100 mcg, Oral, Daily, Nolberto Hanlon, MD   [facility] Hold] mupirocin ointment (BACTROBAN) 2 % 1 Application, 1 Application, Nasal, BID, Nolberto Hanlon, MD, 1 Application at 02/19/23 0721   [MAR Hold] ondansetron (ZOFRAN) injection 4 mg, 4 mg, Intravenous, Q6H PRN, Adefeso, Oladapo, DO, 4 mg at 02/19/23 0013   ondansetron (ZOFRAN) injection 4  mg, 4 mg, Intravenous, Once PRN, Johnnette Litter, Mosetta Putt, MD   [facility] Hold] oxyCODONE (Oxy IR/ROXICODONE) immediate release tablet 5 mg, 5 mg, Oral,  Q4H PRN, Nolberto Hanlon, MD, 5 mg at 02/19/23 1308   oxyCODONE (Oxy IR/ROXICODONE) immediate release tablet 5 mg, 5 mg, Oral, Once PRN **OR** oxyCODONE (ROXICODONE) 5 MG/5ML solution 5 mg, 5 mg, Oral, Once PRN, Johnnette Litter, Mosetta Putt, MD   [MAR Hold] pantoprazole (PROTONIX) injection 40 mg, 40 mg, Intravenous, Q12H, Nolberto Hanlon, MD, 40 mg at 02/19/23 0859   [MAR Hold] polyethylene glycol (MIRALAX / GLYCOLAX) packet 17 g, 17 g, Oral, Daily PRN, Nolberto Hanlon, MD   scopolamine (TRANSDERM-SCOP) 1 MG/3DAYS 1.5 mg, 1 patch, Transdermal, Once, Kiel, Mosetta Putt, MD   [facility] Hold] sodium chloride flush (NS) 0.9 % injection 3 mL, 3 mL, Intravenous, Q12H, Nolberto Hanlon, MD, 3 mL at 02/18/23 2157   sterile water for irrigation for irrigation, , , PRN, Pappayliou, Catherine A, DO, 500 mL at 02/19/23 1147   [MAR Hold] SUMAtriptan (IMITREX) tablet 100 mg, 100 mg, Oral, Once PRN, Nolberto Hanlon, MD Allergies: Poison ivy extract, Poison oak extract, Anser anser feather (goose) allergy skin test, Flexeril [cyclobenzaprine], Lactose intolerance (gi), Honeysuckle flower [lonicera], and Mixed grasses  Complete Review of Systems: GENERAL: negative for malaise, night sweats HEENT: No changes in hearing or vision, no nose bleeds or other nasal problems. NECK: Negative for lumps, goiter, pain and significant neck swelling RESPIRATORY: Negative for cough, wheezing CARDIOVASCULAR: Negative for chest pain, leg swelling, palpitations, orthopnea GI: SEE HPI MUSCULOSKELETAL: Negative for joint pain or swelling, back pain, and muscle pain. SKIN: Negative for lesions, rash PSYCH: Negative for sleep disturbance, mood disorder and recent psychosocial stressors. HEMATOLOGY Negative for prolonged bleeding, bruising easily, and swollen nodes. ENDOCRINE: Negative for cold or heat intolerance, polyuria, polydipsia  and goiter. NEURO: negative for tremor, gait imbalance, syncope and seizures. The remainder of the review of systems is noncontributory.  Physical Exam: BP 122/64   Pulse 69   Temp (!) 96.9 F (36.1 C)   Resp 11   Ht 5\' 7"  (1.702 m)   Wt 128.4 kg   LMP 03/10/2022 (Approximate)   SpO2 (!) 89%   BMI 44.34 kg/m  GENERAL: The patient is AO x3, in no acute distress.  Obese. HEENT: Head is normocephalic and atraumatic. EOMI are intact. Mouth is well hydrated and without lesions. NECK: Supple. No masses LUNGS: Clear to auscultation. No presence of rhonchi/wheezing/rales. Adequate chest expansion HEART: RRR, normal s1 and s2. ABDOMEN: Tender to palpation in the right upper quadrant, no guarding, no peritoneal signs, and nondistended. BS +. No masses. EXTREMITIES: Without any cyanosis, clubbing, rash, lesions or edema. NEUROLOGIC: AOx3, no focal motor deficit. SKIN: no jaundice, no rashes  Laboratory Data CBC:     Component Value Date/Time   WBC 8.7 02/19/2023 0439   RBC 3.94 02/19/2023 0439   HGB 11.8 (L) 02/19/2023 0439   HGB 14.0 09/23/2022 1037   HCT 35.8 (L) 02/19/2023 0439   HCT 42.6 09/23/2022 1037   PLT 324 02/19/2023 0439   PLT 422 09/23/2022 1037   MCV 90.9 02/19/2023 0439   MCV 85 09/23/2022 1037   MCH 29.9 02/19/2023 0439   MCHC 33.0 02/19/2023 0439   RDW 13.5 02/19/2023 0439   RDW 18.2 (H) 09/23/2022 1037   LYMPHSABS 1.9 02/01/2023 0742   LYMPHSABS 2.4 09/23/2022 1037   MONOABS 0.5 02/01/2023 0742   EOSABS 0.0 02/01/2023 0742   EOSABS 0.2 09/23/2022 1037   BASOSABS 0.1 02/01/2023 0742   BASOSABS 0.1 09/23/2022 1037   COAG:  Lab Results  Component Value Date   INR 1.0 02/18/2023  BMP:     Latest Ref Rng & Units 02/19/2023    4:39 AM 02/18/2023    1:56 PM 02/13/2023    9:13 AM  BMP  Glucose 70 - 99 mg/dL 86  161  91   BUN 6 - 20 mg/dL 5  5  8    Creatinine 0.44 - 1.00 mg/dL 0.96  0.45  4.09   Sodium 135 - 145 mmol/L 139  133  134   Potassium 3.5 -  5.1 mmol/L 3.1  3.8  3.9   Chloride 98 - 111 mmol/L 103  100  99   CO2 22 - 32 mmol/L 27  24  23    Calcium 8.9 - 10.3 mg/dL 8.2  9.1  9.0     HEPATIC:     Latest Ref Rng & Units 02/18/2023    1:56 PM 02/01/2023    7:42 AM 01/20/2023    1:51 AM  Hepatic Function  Total Protein 6.5 - 8.1 g/dL 7.5  7.8  7.4   Albumin 3.5 - 5.0 g/dL 3.9  3.9  3.8   AST 15 - 41 U/L 15  14  14    ALT 0 - 44 U/L 18  18  19    Alk Phosphatase 38 - 126 U/L 62  61  63   Total Bilirubin 0.3 - 1.2 mg/dL 0.3  0.5  0.5     CARDIAC: No results found for: "CKTOTAL", "CKMB", "CKMBINDEX", "TROPONINI"   Imaging: I personally reviewed and interpreted the available imaging.  Assessment & Plan: Alaisha Botero is a 32 y.o. female with history of ADHD, degenerative disc disease, hypertension, hypothyroidism, iron deficiency anemia, rheumatoid arthritis, who was admitted to the hospital after presenting worsening abdominal pain and rectal bleeding.  The patient has presented some rectal bleeding after undergoing colonoscopy with very mild drop in her hemoglobin.  This was likely a postpolypectomy bleeding that seems to have resolved on its own as she has not bled anymore today.  For now, we will hold off on further endoscopic evaluation and trend her hemoglobin.  The patient was found to have cholecystitis during this admission and will undergo surgical procedure today.  Kristin Blazing, MD Gastroenterology and Hepatology [facility]

## 2023-02-19 NOTE — Progress Notes (Addendum)
Returned from [employer] after laparoscopic cholecystectomy   Alert and oriented with c/o gas pain.   Surgical sites dry and intact with glue. Vitals stable on room air. Foley in place.  Asked to walk to relieve gas and is now walking with walker in hallway, initially with standby but now independently.

## 2023-02-19 NOTE — Progress Notes (Signed)
Incentive spirometer given and educated

## 2023-02-19 NOTE — Transfer of Care (Signed)
Immediate Anesthesia Transfer of Care Note  Patient: Kristin Baker  Procedure(s) Performed: XI ROBOTIC ASSISTED LAPAROSCOPIC CHOLECYSTECTOMY  Patient Location: PACU  Anesthesia Type:General  Level of Consciousness: drowsy  Airway & Oxygen Therapy: Patient Spontanous Breathing and Patient connected to face mask oxygen  Post-op Assessment: Report given to RN and Post -op Vital signs reviewed and stable  Post vital signs: Reviewed and stable  Last Vitals:  Vitals Value Taken Time  BP 126/69 02/19/23 1320  Temp    Pulse 68 02/19/23 1321  Resp 17 02/19/23 1321  SpO2 99 % 02/19/23 1321  Vitals shown include unfiled device data.  Last Pain:  Vitals:   02/19/23 0929  TempSrc: Oral  PainSc: 3          Complications: No notable events documented.

## 2023-02-19 NOTE — Hospital Course (Signed)
32 y.o. female with medical history significant of abdominal pain.  As part of the workup patient had a colonoscopy done yesterday with some polyp removal.  Patient apparently did well in the immediate postoperative period.  And was discharged before noon time yesterday.  Patient woke up this morning with epigastric versus right upper quadrant aching pain constant.  And subsequently reports having had bright red blood per rectum.  Pain is ongoing.  Bleeding per rectum has remitted since patient arrival to the ER.  There is no fever or rigors.  No dysuria no other site of bleeding.

## 2023-02-19 NOTE — Op Note (Signed)
[facility] Operative Note  02/19/23  Preoperative Diagnosis: Acute cholecystitis   Postoperative Diagnosis: Same   Procedure(s) Performed: Robotic Assisted Laparoscopic Cholecystectomy   Surgeon: Theophilus Kinds, DO   Assistants: No qualified resident was available    Anesthesia: General endotracheal   Anesthesiologist: Windell Norfolk, MD    Specimens: Gallbladder   Estimated Blood Loss: Minimal   Blood Replacement: None    Complications: None   Wound Class: Contaminated   Operative Indications: The patient was found to have findings concerning for acute cholecystitis on CT and abdominal US.  She continued to have persistent pain, so decision was made to take the patient to the operating room.  We discussed the risk of the procedure including but not limited to bleeding, infection, injury to normal structures (including common bile duct), conversion to open surgery, chance of subtotal cholecystectomy,  persistent symptoms, evolution of postcholecystectomy diarrhea, need for secondary interventions, anesthesia reaction, cardiopulmonary issues and other risks not specifically detailed here.  Patient is agreeable and written informed consent was obtained.  Findings:  Acute cholecystitis with inflamed and edematous gallbladder Critical view of safety noted All clips intact at the end of the case Adequate hemostasis   Procedure: Firefly was given in the preoperative area. The patient was taken to the operating room and placed supine. General endotracheal anesthesia was induced. Patient receiving IV antibiotics on the floor.  An orogastric tube positioned to decompress the stomach. The abdomen was prepared and draped in the usual sterile fashion. A time-out was completed verifying correct patient, procedure, site, positioning, and implant(s) and/or special equipment prior to beginning this procedure.  Veress needle was placed at the infraumbilical area and  insufflation was started after confirming a positive saline drop test and no immediate increase in abdominal pressure.  After reaching 15 mm, the Veress needle was removed and a 8 mm port was placed via optiview technique infraumbilical, measuring 20 mm away from the suspected position of the gallbladder.  The abdomen was inspected and no abnormalities or injuries were found.  Under direct vision, ports were placed in the following locations in a semi curvilinear position around the target of the gallbladder: Two 8 mm ports on the patient's right each having 8cm clearance to the adjacent ports and one 8 mm port placed on the patient's left 8 cm from the umbilical port. Once ports were placed, the table was placed in the reverse Trendelenburg position with the right side up. The Xi platform was brought into the operative field and docked to the ports successfully.  An endoscope was placed through the umbilical port, prograsp through the most lateral right port, fenestrated bipolar to the port just right of the umbilicus, and then a hook cautery in the left port.  The dome of the gallbladder was grasped with prograsp and retracted over the dome of the liver.  The gallbladder was noted to be significantly distended with inflammation and edema.  Adhesions between the gallbladder and omentum, duodenum and transverse colon were lysed via hook cautery. The infundibulum was grasped with the fenestrated grasper and retracted toward the right lower quadrant. This maneuver exposed Calot's triangle. Firefly was used throughout the dissection to ensure safe visualization of the cystic duct.  The peritoneum overlying the gallbladder infundibulum was then dissected and the cystic duct and cystic artery identified.  Critical view of safety with the liver bed clearly visible behind the duct and artery with no additional structures noted.  The cystic duct and cystic artery  were doubly clipped and divided close to the gallbladder.     The gallbladder was then dissected from its peritoneal and liver bed attachments by electrocautery. Hemostasis was checked prior to removing the hook cautery.  The [employer] was undocked and moved out of the field.  A 5mm Endo Catch bag was then placed through the umbilical port and the gallbladder was removed.  The gallbladder was passed off the table as a specimen. There was no evidence of bleeding from the gallbladder fossa or cystic artery or leakage of the bile from the cystic duct stump. The umbilical port site closed with a 0 vicryl with a PMI needle.  The abdomen was desufflated and secondary trocars were removed under direct vision. No bleeding was noted. Incisions were localized with marcaine.  All skin incisions were closed with subcuticular sutures of 4-0 monocryl and dermabond.   Final inspection revealed acceptable hemostasis. All counts were correct at the end of the case. The patient was awakened from anesthesia and extubated without complication. The OG tube was removed.  The patient went to the PACU in stable condition.   Theophilus Kinds, DO [facility] [address] [place], [place] [address] [phone] (office)

## 2023-02-19 NOTE — Discharge Instructions (Signed)
Surgery Discharge Instructions  Activity  You are advised to go directly home from the hospital.  Restrict your activities and rest for a day.  Resume light activity tomorrow. No heavy lifting over 10 lbs or strenuous exercise.  Fluids and Diet Regular diet  Medications  If you have not had a bowel movement in 24 hours, take 2 tablespoons over the counter Milk of mag.             You May resume your blood thinners tomorrow (Aspirin, coumadin, or other).  You are being discharged with prescriptions for Opioid/Narcotic Medications: There are some specific considerations for these medications that you should know. Opioid Meds have risks & benefits. Addiction to these meds is always a concern with prolonged use Take medication only as directed Do not drive while taking narcotic pain medication Do not crush tablets or capsules Do not use a different container than medication was dispensed in Lock the container of medication in a cool, dry place out of reach of children and pets. Opioid medication can cause addiction Do not share with anyone else (this is a felony) Do not store medications for future use. Dispose of them properly.     Disposal:  Find a [employer] household drug take back site near you.  If you can't get to a drug take back site, use the recipe below as a last resort to dispose of expired, unused or unwanted drugs. Disposal  (Do not dispose chemotherapy drugs this way, talk to your prescribing doctor instead.) Step 1: Mix drugs (do not crush) with dirt, kitty litter, or used coffee grounds and add a small amount of water to dissolve any solid medications. Step 2: Seal drugs in plastic bag. Step 3: Place plastic bag in trash. Step 4: Take prescription container and scratch out personal information, then recycle or throw away.  Operative Site  You have a liquid bandage over your incisions, this will begin to flake off in about a week. Ok to English as a second language teacher. Keep wound clean  and dry. No baths or swimming. No lifting more than 10 pounds.  Contact Information: If you have questions or concerns, please call our office, [phone], Monday- Thursday 8AM-5PM and Friday 8AM-12Noon.  If it is after hours or on the weekend, please call [facility] Main Number, [phone], and ask to speak to the surgeon on call for Dr. Robyne Peers at [facility].   SPECIFIC COMPLICATIONS TO WATCH FOR: Inability to urinate Fever over 101? F by mouth Nausea and vomiting lasting longer than 24 hours. Pain not relieved by medication ordered Swelling around the operative site Increased redness, warmth, hardness, around operative area Numbness, tingling, or cold fingers or toes Blood -soaked dressing, (small amounts of oozing may be normal) Increasing and progressive drainage from surgical area or exam site

## 2023-02-19 NOTE — Progress Notes (Signed)
Ate about 75% of supper with no nausea but a little increased pain. Is walking and has voided since foley removal.

## 2023-02-19 NOTE — Progress Notes (Signed)
[facility]  Spoke with the patient's husband on the phone.  I explained that she tolerated the procedure without difficulty.  She has dissolvable stitches under the skin with overlying skin glue.  This will flake off in 10 to 14 days.  She will return to her bed on the floor.  She will have a regular diet and oral pain medications.  IF she is able to tolerate a diet and pain is controlled with oral medications, she is stale for discharge home from a general surgery standpoint.  We did discuss that with the urinary retention she had present upon admission, her discharge may be slightly delayed to verify she is able to void without issue after removal of foley.  All questions were answered to his expressed satisfaction.  Plan: -Return to the floor -Regular diet -PRN pain control and antiemetics -Will defer foley removal to hospitalist -Will continue IV antibiotics while inpatient.  No need for antibiotics at the time of discharge -AM labs -Hopeful for discharge home tomorrow  Theophilus Kinds, DO [facility] [address] [place], [place] [address] [phone] (office)

## 2023-02-19 NOTE — Anesthesia Procedure Notes (Signed)
Procedure Name: Intubation Date/Time: 02/19/2023 11:14 AM  Performed by: Julian Reil, CRNAPre-anesthesia Checklist: Patient identified, Emergency Drugs available, Suction available and Patient being monitored Patient Re-evaluated:Patient Re-evaluated prior to induction Oxygen Delivery Method: Circle system utilized Preoxygenation: Pre-oxygenation with 100% oxygen Induction Type: IV induction Ventilation: Mask ventilation without difficulty Laryngoscope Size: Miller and 3 Grade View: Grade I Tube type: Oral Tube size: 7.5 mm Number of attempts: 1 Airway Equipment and Method: Stylet Placement Confirmation: ETT inserted through vocal cords under direct vision, positive ETCO2 and breath sounds checked- equal and bilateral Secured at: 23 cm Tube secured with: Tape Dental Injury: Teeth and Oropharynx as per pre-operative assessment

## 2023-02-20 ENCOUNTER — Telehealth: Payer: Self-pay | Admitting: Gastroenterology

## 2023-02-20 ENCOUNTER — Other Ambulatory Visit (INDEPENDENT_AMBULATORY_CARE_PROVIDER_SITE_OTHER): Payer: Self-pay | Admitting: *Deleted

## 2023-02-20 DIAGNOSIS — R338 Other retention of urine: Secondary | ICD-10-CM | POA: Diagnosis not present

## 2023-02-20 DIAGNOSIS — D500 Iron deficiency anemia secondary to blood loss (chronic): Secondary | ICD-10-CM

## 2023-02-20 DIAGNOSIS — K921 Melena: Secondary | ICD-10-CM | POA: Diagnosis not present

## 2023-02-20 DIAGNOSIS — R1011 Right upper quadrant pain: Secondary | ICD-10-CM | POA: Diagnosis not present

## 2023-02-20 DIAGNOSIS — K8020 Calculus of gallbladder without cholecystitis without obstruction: Secondary | ICD-10-CM | POA: Diagnosis not present

## 2023-02-20 DIAGNOSIS — K625 Hemorrhage of anus and rectum: Secondary | ICD-10-CM

## 2023-02-20 LAB — CULTURE, BLOOD (ROUTINE X 2)
Special Requests: ADEQUATE
Special Requests: ADEQUATE

## 2023-02-20 LAB — CBC WITH DIFFERENTIAL/PLATELET
Abs Immature Granulocytes: 0.06 10*3/uL (ref 0.00–0.07)
Basophils Absolute: 0 10*3/uL (ref 0.0–0.1)
Basophils Relative: 0 %
Eosinophils Absolute: 0 10*3/uL (ref 0.0–0.5)
Eosinophils Relative: 0 %
HCT: 36.6 % (ref 36.0–46.0)
Hemoglobin: 11.9 g/dL — ABNORMAL LOW (ref 12.0–15.0)
Immature Granulocytes: 1 %
Lymphocytes Relative: 19 %
Lymphs Abs: 2.1 10*3/uL (ref 0.7–4.0)
MCH: 29.6 pg (ref 26.0–34.0)
MCHC: 32.5 g/dL (ref 30.0–36.0)
MCV: 91 fL (ref 80.0–100.0)
Monocytes Absolute: 0.6 10*3/uL (ref 0.1–1.0)
Monocytes Relative: 5 %
Neutro Abs: 7.9 10*3/uL — ABNORMAL HIGH (ref 1.7–7.7)
Neutrophils Relative %: 75 %
Platelets: 328 10*3/uL (ref 150–400)
RBC: 4.02 MIL/uL (ref 3.87–5.11)
RDW: 13.5 % (ref 11.5–15.5)
WBC: 10.6 10*3/uL — ABNORMAL HIGH (ref 4.0–10.5)
nRBC: 0 % (ref 0.0–0.2)

## 2023-02-20 LAB — COMPREHENSIVE METABOLIC PANEL
ALT: 29 U/L (ref 0–44)
AST: 25 U/L (ref 15–41)
Albumin: 3.2 g/dL — ABNORMAL LOW (ref 3.5–5.0)
Alkaline Phosphatase: 55 U/L (ref 38–126)
Anion gap: 8 (ref 5–15)
BUN: <5 mg/dL — ABNORMAL LOW (ref 6–20)
CO2: 25 mmol/L (ref 22–32)
Calcium: 8 mg/dL — ABNORMAL LOW (ref 8.9–10.3)
Chloride: 104 mmol/L (ref 98–111)
Creatinine, Ser: 0.72 mg/dL (ref 0.44–1.00)
GFR, Estimated: >60 mL/min (ref >60)
Glucose, Bld: 113 mg/dL — ABNORMAL HIGH (ref 70–99)
Potassium: 3.8 mmol/L (ref 3.5–5.1)
Sodium: 137 mmol/L (ref 135–145)
Total Bilirubin: 0.1 mg/dL — ABNORMAL LOW (ref 0.3–1.2)
Total Protein: 6.5 g/dL (ref 6.5–8.1)

## 2023-02-20 LAB — MAGNESIUM: Magnesium: 1.7 mg/dL (ref 1.7–2.4)

## 2023-02-20 LAB — SURGICAL PATHOLOGY

## 2023-02-20 NOTE — Discharge Summary (Signed)
Physician Discharge Summary  Kristin Baker ZOX:[id number] DOB: [birth year] DOA: 02/18/2023  PCP: Gilmore Laroche, FNP Surgery: Dr. Robyne Peers  Admit date: 02/18/2023 Discharge date: 02/20/2023  Admitted From:  Home  Disposition:  Home   Recommendations for Outpatient Follow-up:  Follow up with surgery in 2 weeks for postop   Discharge Condition: STABLE   CODE STATUS: FULL DIET: Regular    Brief Hospitalization Summary: Please see all hospital notes, images, labs for full details of the hospitalization. Admission Provider HPI:  32 y.o. female with medical history significant of abdominal pain.  As part of the workup patient had a colonoscopy done yesterday with some polyp removal.  Patient apparently did well in the immediate postoperative period.  And was discharged before noon time yesterday.  Patient woke up this morning with epigastric versus right upper quadrant aching pain constant.  And subsequently reports having had bright red blood per rectum.  Pain is ongoing.  Bleeding per rectum has remitted since patient arrival to the ER.  There is no fever or rigors.  No dysuria no other site of bleeding.    Hospital Course by problem    Acute cholecystitis  POD#1 s/p lap chole with Dr. Robyne Peers -pt tolerating diet well and ambulating -spoke with surgery ok to DC home today -pt to follow up in 2 weeks with surgery for phone postop check   Abdominal Pain - very minimal postop pain    Acute Urinary Retention  - s/p foley placement - removed foley after surgery and patient has been voiding     Discharge Diagnoses:  Principal Problem:   Abdominal pain Active Problems:   Acute urinary retention   Calculus of gallbladder without cholecystitis without obstruction   Hematochezia   Cholecystitis   Discharge Instructions: Discharge Instructions     Call MD for:  persistant nausea and vomiting   Complete by: As directed    Call MD for:  redness, tenderness, or signs of  infection (pain, swelling, redness, odor or green/yellow discharge around incision site)   Complete by: As directed    Call MD for:  severe uncontrolled pain   Complete by: As directed    Call MD for:  temperature >100.4   Complete by: As directed    Diet - low sodium heart healthy   Complete by: As directed    Increase activity slowly   Complete by: As directed       Allergies as of 02/20/2023       Reactions   Poison Ivy Extract Anaphylaxis   Poison Oak Extract Anaphylaxis   Anser Anser Feather (goose) Allergy Skin Test    Flexeril [cyclobenzaprine] Hives   Lactose Intolerance (gi) Diarrhea   Honeysuckle Flower [lonicera] Rash   Mixed Grasses Rash        Medication List     STOP taking these medications    polyethylene glycol-electrolytes 420 g solution Commonly known as: NuLYTELY       TAKE these medications    acetaminophen 500 MG tablet Commonly known as: TYLENOL Take 2 tablets (1,000 mg total) by mouth every 6 (six) hours. What changed:  how much to take when to take this reasons to take this   albuterol 108 (90 Base) MCG/ACT inhaler Commonly known as: Ventolin HFA Inhale 2 puffs into the lungs every 6 (six) hours as needed for wheezing or shortness of breath.   amphetamine-dextroamphetamine 5 MG tablet Commonly known as: ADDERALL Take 5 mg by mouth daily in the afternoon.  amphetamine-dextroamphetamine 20 MG 24 hr capsule Commonly known as: ADDERALL XR Take 20 mg by mouth daily.   azelastine 0.1 % nasal spray Commonly known as: ASTELIN 2 sprays per nostril 1-2 times daily as needed.   docusate sodium 100 MG capsule Commonly known as: Colace Take 1 capsule (100 mg total) by mouth 2 (two) times daily.   Emgality 120 MG/ML Soaj Generic drug: Galcanezumab-gnlm Inject 1 pen  into the skin every 30 (thirty) days.   FLUoxetine 10 MG capsule Commonly known as: PROZAC Take 10 mg by mouth daily. What changed: Another medication with the same  name was removed. Continue taking this medication, and follow the directions you see here.   levocetirizine 5 MG tablet Commonly known as: XYZAL Take 1 tablet (5 mg total) by mouth daily.   levothyroxine 100 MCG tablet Commonly known as: SYNTHROID Take 1 tablet (100 mcg total) by mouth daily.   olmesartan-hydrochlorothiazide 20-12.5 MG tablet Commonly known as: BENICAR HCT TAKE 1 TABLET BY MOUTH DAILY   omeprazole 40 MG capsule Commonly known as: PRILOSEC Take 1 capsule (40 mg total) by mouth daily.   ondansetron 4 MG tablet Commonly known as: Zofran Take 1 tablet (4 mg total) by mouth daily as needed for nausea or vomiting.   oxyCODONE 5 MG immediate release tablet Commonly known as: Roxicodone Take 1 tablet (5 mg total) by mouth every 6 (six) hours as needed.   psyllium 58.6 % packet Commonly known as: METAMUCIL Take 1 packet by mouth 2 (two) times daily.   sucralfate 1 g tablet Commonly known as: Carafate Take 1 tablet (1 g total) by mouth 4 (four) times daily -  with meals and at bedtime.   tiZANidine 4 MG tablet Commonly known as: ZANAFLEX TAKE 1 TABLET(4 MG) BY MOUTH AT BEDTIME AS NEEDED FOR MUSCLE SPASMS   triamcinolone 55 MCG/ACT Aero nasal inhaler Commonly known as: NASACORT Place 1 spray into the nose daily.   Vitamin D3 25 MCG (1000 UT) Caps Take 1 capsule (1,000 Units total) by mouth daily.        Follow-up Information     Pappayliou, Gustavus Messing, DO. Call.   Specialty: General Surgery Why: Call to schedule a phone follow up in 2 weeks Contact information: [address] [facility] [address] [phone]                Allergies  Allergen Reactions   Poison Ivy Extract Anaphylaxis   Poison Oak Extract Anaphylaxis   Anser Anser Feather (Goose) Allergy Skin Test    Flexeril [Cyclobenzaprine] Hives   Lactose Intolerance (Gi) Diarrhea   Honeysuckle Flower [Lonicera] Rash   Mixed Grasses Rash   Allergies as of 02/20/2023        Reactions   Poison Ivy Extract Anaphylaxis   Poison Oak Extract Anaphylaxis   Anser Anser Feather (goose) Allergy Skin Test    Flexeril [cyclobenzaprine] Hives   Lactose Intolerance (gi) Diarrhea   Honeysuckle Flower [lonicera] Rash   Mixed Grasses Rash        Medication List     STOP taking these medications    polyethylene glycol-electrolytes 420 g solution Commonly known as: NuLYTELY       TAKE these medications    acetaminophen 500 MG tablet Commonly known as: TYLENOL Take 2 tablets (1,000 mg total) by mouth every 6 (six) hours. What changed:  how much to take when to take this reasons to take this   albuterol 108 (90 Base) MCG/ACT inhaler Commonly  known as: Ventolin HFA Inhale 2 puffs into the lungs every 6 (six) hours as needed for wheezing or shortness of breath.   amphetamine-dextroamphetamine 5 MG tablet Commonly known as: ADDERALL Take 5 mg by mouth daily in the afternoon.   amphetamine-dextroamphetamine 20 MG 24 hr capsule Commonly known as: ADDERALL XR Take 20 mg by mouth daily.   azelastine 0.1 % nasal spray Commonly known as: ASTELIN 2 sprays per nostril 1-2 times daily as needed.   docusate sodium 100 MG capsule Commonly known as: Colace Take 1 capsule (100 mg total) by mouth 2 (two) times daily.   Emgality 120 MG/ML Soaj Generic drug: Galcanezumab-gnlm Inject 1 pen  into the skin every 30 (thirty) days.   FLUoxetine 10 MG capsule Commonly known as: PROZAC Take 10 mg by mouth daily. What changed: Another medication with the same name was removed. Continue taking this medication, and follow the directions you see here.   levocetirizine 5 MG tablet Commonly known as: XYZAL Take 1 tablet (5 mg total) by mouth daily.   levothyroxine 100 MCG tablet Commonly known as: SYNTHROID Take 1 tablet (100 mcg total) by mouth daily.   olmesartan-hydrochlorothiazide 20-12.5 MG tablet Commonly known as: BENICAR HCT TAKE 1 TABLET BY MOUTH DAILY    omeprazole 40 MG capsule Commonly known as: PRILOSEC Take 1 capsule (40 mg total) by mouth daily.   ondansetron 4 MG tablet Commonly known as: Zofran Take 1 tablet (4 mg total) by mouth daily as needed for nausea or vomiting.   oxyCODONE 5 MG immediate release tablet Commonly known as: Roxicodone Take 1 tablet (5 mg total) by mouth every 6 (six) hours as needed.   psyllium 58.6 % packet Commonly known as: METAMUCIL Take 1 packet by mouth 2 (two) times daily.   sucralfate 1 g tablet Commonly known as: Carafate Take 1 tablet (1 g total) by mouth 4 (four) times daily -  with meals and at bedtime.   tiZANidine 4 MG tablet Commonly known as: ZANAFLEX TAKE 1 TABLET(4 MG) BY MOUTH AT BEDTIME AS NEEDED FOR MUSCLE SPASMS   triamcinolone 55 MCG/ACT Aero nasal inhaler Commonly known as: NASACORT Place 1 spray into the nose daily.   Vitamin D3 25 MCG (1000 UT) Caps Take 1 capsule (1,000 Units total) by mouth daily.        Procedures/Studies: US Abdomen Limited  Result Date: 02/18/2023 CLINICAL DATA:  Right upper quadrant pain EXAM: ULTRASOUND ABDOMEN LIMITED RIGHT UPPER QUADRANT COMPARISON:  CT abdomen and pelvis 02/18/2023 FINDINGS: Gallbladder: Multiple mobile calcifications are present measuring up to 1.5 cm. The gallbladder wall is mildly thickened measuring 4 mm. There is no pericholecystic fluid. No sonographic Murphy sign noted by sonographer. Common bile duct: Diameter: 5 mm Liver: No focal lesion identified. Increase in parenchymal echogenicity. Portal vein is patent on color Doppler imaging with normal direction of blood flow towards the liver. Other: None. IMPRESSION: 1. Cholelithiasis with mild gallbladder wall thickening. No pericholecystic fluid or sonographic Murphy sign. Findings are equivocal for acute cholecystitis. 2. Increased echogenicity of the hepatic parenchyma is a nonspecific indicator of hepatocellular dysfunction, most commonly steatosis. Electronically Signed    By: Darliss Cheney M.D.   On: 02/18/2023 19:14   CT ABDOMEN PELVIS W CONTRAST  Result Date: 02/18/2023 CLINICAL DATA:  Acute abdominal pain.  Recent colonoscopy. EXAM: CT ABDOMEN AND PELVIS WITH CONTRAST TECHNIQUE: Multidetector CT imaging of the abdomen and pelvis was performed using the standard protocol following bolus administration of intravenous contrast. RADIATION DOSE  REDUCTION: This exam was performed according to the departmental dose-optimization program which includes automated exposure control, adjustment of the mA and/or kV according to patient size and/or use of iterative reconstruction technique. CONTRAST:  OMNIPAQUE IOHEXOL 300 MG/ML  SOLN COMPARISON:  None Available. FINDINGS: Lower chest: No acute abnormality. Hepatobiliary: Gallstones are present. There is gallbladder wall edema/thickening. There is no biliary ductal dilatation. The liver is within normal limits. Pancreas: Unremarkable. No pancreatic ductal dilatation or surrounding inflammatory changes. Spleen: Normal in size without focal abnormality. Adrenals/Urinary Tract: There is a subcentimeter hypodensity in the left kidney which is favored as cysts. Otherwise, the adrenal glands, kidneys and bladder are within normal limits. Bladder distention is noted. Stomach/Bowel: Stomach is within normal limits. Appendix appears normal. No evidence of bowel wall thickening, distention, or inflammatory changes. Vascular/Lymphatic: No significant vascular findings are present. No enlarged abdominal or pelvic lymph nodes. Reproductive: Status post hysterectomy. No adnexal masses. Other: No abdominal wall hernia or abnormality. No abdominopelvic ascites. Musculoskeletal: There are degenerative changes at L5-S1. IMPRESSION: 1. Cholelithiasis with gallbladder wall thickening/edema. Findings are concerning for acute cholecystitis. Consider further evaluation with ultrasound. 2. Bladder distention. 3. Status post hysterectomy 4. Subcentimeter left  Bosniak I benign renal cyst. No follow-up imaging is recommended. JACR 2018 Feb; 264-273, Management of the Incidental Renal Mass on CT, [employer] 2021; [id number], Bosniak Classification of Cystic Renal Masses, Version 2019. Electronically Signed   By: Darliss Cheney M.D.   On: 02/18/2023 17:29     Subjective: Pt tolerating diet well, minimal pain, ambulating in room, no complaints. Eager to go home.   Discharge Exam: Vitals:   02/20/23 0335 02/20/23 0849  BP: 107/64   Pulse: (!) 51   Resp: 17   Temp: 98.1 F (36.7 C)   SpO2: 95% 96%   Vitals:   02/19/23 1500 02/19/23 1944 02/20/23 0335 02/20/23 0849  BP: (!) 112/58 117/82 107/64   Pulse: 70 64 (!) 51   Resp: 16 18 17    Temp: 97.6 F (36.4 C) 97.8 F (36.6 C) 98.1 F (36.7 C)   TempSrc: Oral Oral Oral   SpO2: 100% 96% 95% 96%  Weight:      Height:       General: Pt is alert, awake, not in acute distress Cardiovascular: normal S1/S2 +, no rubs, no gallops Respiratory: CTA bilaterally, no wheezing, no rhonchi Abdominal: Soft, NT, ND, bowel sounds + wound clean/dry Extremities: no edema, no cyanosis   The results of significant diagnostics from this hospitalization (including imaging, microbiology, ancillary and laboratory) are listed below for reference.     Microbiology: Recent Results (from the past 240 hour(s))  Culture, blood (Routine X 2) w Reflex to ID Panel     Status: None (Preliminary result)   Collection Time: 02/18/23  8:00 PM   Specimen: Left Antecubital; Blood  Result Value Ref Range Status   Specimen Description LEFT ANTECUBITAL  Final   Special Requests   Final    BOTTLES DRAWN AEROBIC AND ANAEROBIC Blood Culture adequate volume   Culture   Final    NO GROWTH 2 DAYS Performed at [facility], [address] [place], [place] [address]    Report Status PENDING  Incomplete  Culture, blood (Routine X 2) w Reflex to ID Panel     Status: None (Preliminary result)   Collection Time: 02/18/23  8:12 PM    Specimen: Left Antecubital; Blood  Result Value Ref Range Status   Specimen Description LEFT ANTECUBITAL  Final  Special Requests   Final    BOTTLES DRAWN AEROBIC AND ANAEROBIC Blood Culture adequate volume   Culture   Final    NO GROWTH 2 DAYS Performed at [facility], [address], [place], [place] [address]    Report Status PENDING  Incomplete  Surgical PCR screen     Status: Abnormal   Collection Time: 02/18/23 11:11 PM   Specimen: Nasal Mucosa; Nasal Swab  Result Value Ref Range Status   MRSA, PCR NONE DETECTED (A) NEGATIVE Final    Comment: RESULT CALLED TO, READ BACK BY AND VERIFIED WITH: S. BROWN AT 0317 ON 09.25.24 BY ADGER J     Staphylococcus aureus DETECTED (A) NEGATIVE Final    Comment: RESULT CALLED TO, READ BACK BY AND VERIFIED WITH: S. BROWN AT 0317 ON 09.25.24 BY ADGER J  (NOTE) The Xpert SA Assay (FDA approved for NASAL specimens in patients 32 years of age and older), is one component of a comprehensive surveillance program. It is not intended to diagnose infection nor to guide or monitor treatment. Performed at [facility], [address] [place], [place] [address]      Labs: BNP (last 3 results) No results for input(s): "BNP" in the last 8760 hours. Basic Metabolic Panel: Recent Labs  Lab 02/18/23 1356 02/19/23 0439 02/20/23 0448  NA 133* 139 137  K 3.8 3.1* 3.8  CL 100 103 104  CO2 24 27 25   GLUCOSE 113* 86 113*  BUN 5* <5* <5*  CREATININE 0.69 0.79 0.72  CALCIUM 9.1 8.2* 8.0*  MG  --  1.8 1.7   Liver Function Tests: Recent Labs  Lab 02/18/23 1356 02/20/23 0448  AST 15 25  ALT 18 29  ALKPHOS 62 55  BILITOT 0.3 0.1*  PROT 7.5 6.5  ALBUMIN 3.9 3.2*   Recent Labs  Lab 02/18/23 1356  LIPASE 22   No results for input(s): "AMMONIA" in the last 168 hours. CBC: Recent Labs  Lab 02/18/23 1356 02/19/23 0439 02/20/23 0448  WBC 12.3* 8.7 10.6*  NEUTROABS  --   --  7.9*  HGB 13.1 11.8* 11.9*  HCT 38.1 35.8* 36.6  MCV 88.2 90.9  91.0  PLT 400 324 328   Cardiac Enzymes: No results for input(s): "CKTOTAL", "CKMB", "CKMBINDEX", "TROPONINI" in the last 168 hours. BNP: Invalid input(s): "POCBNP" CBG: No results for input(s): "GLUCAP" in the last 168 hours. D-Dimer No results for input(s): "DDIMER" in the last 72 hours. Hgb A1c No results for input(s): "HGBA1C" in the last 72 hours. Lipid Profile No results for input(s): "CHOL", "HDL", "LDLCALC", "TRIG", "CHOLHDL", "LDLDIRECT" in the last 72 hours. Thyroid function studies No results for input(s): "TSH", "T4TOTAL", "T3FREE", "THYROIDAB" in the last 72 hours.  Invalid input(s): "FREET3" Anemia work up No results for input(s): "VITAMINB12", "FOLATE", "FERRITIN", "TIBC", "IRON", "RETICCTPCT" in the last 72 hours. Urinalysis    Component Value Date/Time   COLORURINE COLORLESS (A) 02/18/2023 1820   APPEARANCEUR CLEAR 02/18/2023 1820   LABSPEC 1.013 02/18/2023 1820   PHURINE 7.0 02/18/2023 1820   GLUCOSEU NEGATIVE 02/18/2023 1820   HGBUR NEGATIVE 02/18/2023 1820   BILIRUBINUR NEGATIVE 02/18/2023 1820   KETONESUR NEGATIVE 02/18/2023 1820   PROTEINUR NEGATIVE 02/18/2023 1820   NITRITE NEGATIVE 02/18/2023 1820   LEUKOCYTESUR NEGATIVE 02/18/2023 1820   Sepsis Labs Recent Labs  Lab 02/18/23 1356 02/19/23 0439 02/20/23 0448  WBC 12.3* 8.7 10.6*   Microbiology Recent Results (from the past 240 hour(s))  Culture, blood (Routine X 2) w Reflex  to ID Panel     Status: None (Preliminary result)   Collection Time: 02/18/23  8:00 PM   Specimen: Left Antecubital; Blood  Result Value Ref Range Status   Specimen Description LEFT ANTECUBITAL  Final   Special Requests   Final    BOTTLES DRAWN AEROBIC AND ANAEROBIC Blood Culture adequate volume   Culture   Final    NO GROWTH 2 DAYS Performed at [facility], [address], [place], [place] [address]    Report Status PENDING  Incomplete  Culture, blood (Routine X 2) w Reflex to ID Panel     Status: None (Preliminary  result)   Collection Time: 02/18/23  8:12 PM   Specimen: Left Antecubital; Blood  Result Value Ref Range Status   Specimen Description LEFT ANTECUBITAL  Final   Special Requests   Final    BOTTLES DRAWN AEROBIC AND ANAEROBIC Blood Culture adequate volume   Culture   Final    NO GROWTH 2 DAYS Performed at [facility], [address], [place], [place] [address]    Report Status PENDING  Incomplete  Surgical PCR screen     Status: Abnormal   Collection Time: 02/18/23 11:11 PM   Specimen: Nasal Mucosa; Nasal Swab  Result Value Ref Range Status   MRSA, PCR NONE DETECTED (A) NEGATIVE Final    Comment: RESULT CALLED TO, READ BACK BY AND VERIFIED WITH: S. BROWN AT 0317 ON 09.25.24 BY ADGER J     Staphylococcus aureus DETECTED (A) NEGATIVE Final    Comment: RESULT CALLED TO, READ BACK BY AND VERIFIED WITH: S. BROWN AT 0317 ON 09.25.24 BY ADGER J  (NOTE) The Xpert SA Assay (FDA approved for NASAL specimens in patients 22 years of age and older), is one component of a comprehensive surveillance program. It is not intended to diagnose infection nor to guide or monitor treatment. Performed at [facility], [address] [place], [place] [address]     Time coordinating discharge:   SIGNED:  Standley Dakins, MD  [facility] 02/20/2023, 10:30 AM How to contact the [facility] Attending or Consulting provider 7A - 7P or covering provider during after hours 7P -7A, for this patient?  Check the care team in [facility] and look for a) attending/consulting [facility] provider listed and b) the [facility] team listed Log into [email or web address] and use [facility]'s universal password to access. If you do not have the password, please contact the hospital operator. Locate the [facility] provider you are looking for under [facility] and page to a number that you can be directly reached. If you still have difficulty reaching the provider, please page the [facility] (Director on Call) for the Hospitalists listed on amion for  assistance.

## 2023-02-20 NOTE — Progress Notes (Signed)
Gastroenterology Progress Note   Referring Provider: No ref. provider found Primary Care Physician:  Gilmore Laroche, FNP Primary Gastroenterologist:  Dr. Tasia Catchings  Patient ID: Kristin Baker; [id number]; 1991-04-22    Subjective   No rectal bleeding, this has subsided.  Slight abdominal soreness at site of incisions.  No right upper quadrant pain.  Tolerating diet.   Objective   Vital signs in last 24 hours Temp:  [96.9 F (36.1 C)-98.1 F (36.7 C)] 98.1 F (36.7 C) (09/26 0335) Pulse Rate:  [51-81] 51 (09/26 0335) Resp:  [0-21] 17 (09/26 0335) BP: (105-129)/(58-82) 107/64 (09/26 0335) SpO2:  [89 %-100 %] 96 % (09/26 0849) Last BM Date : 02/18/23  Physical Exam General:   Alert and oriented, pleasant Head:  Normocephalic and atraumatic. Eyes:  No icterus, sclera clear. Conjuctiva pink.  Mouth:  Without lesions, mucosa pink and moist.  Neck:  Supple, without thyromegaly or masses.  Abdomen:  Bowel sounds present, soft, non-tender, non-distended. No HSM or hernias noted. No rebound or guarding. No masses appreciated  Neurologic:  Alert and  oriented x4;  grossly normal neurologically. Psych:  Alert and cooperative. Normal mood and affect.  Intake/Output from previous day: 09/25 0701 - 09/26 0700 In: 2034.6 [P.O.:240; I.V.:1494.6; IV Piggyback:300] Out: 600 [Urine:600] Intake/Output this shift: Total I/O In: 240 [P.O.:240] Out: -   Lab Results  Recent Labs    02/18/23 1356 02/19/23 0439 02/20/23 0448  WBC 12.3* 8.7 10.6*  HGB 13.1 11.8* 11.9*  HCT 38.1 35.8* 36.6  PLT 400 324 328   BMET Recent Labs    02/18/23 1356 02/19/23 0439 02/20/23 0448  NA 133* 139 137  K 3.8 3.1* 3.8  CL 100 103 104  CO2 24 27 25   GLUCOSE 113* 86 113*  BUN 5* <5* <5*  CREATININE 0.69 0.79 0.72  CALCIUM 9.1 8.2* 8.0*   LFT Recent Labs    02/18/23 1356 02/20/23 0448  PROT 7.5 6.5  ALBUMIN 3.9 3.2*  AST 15 25  ALT 18 29  ALKPHOS 62 55  BILITOT 0.3 0.1*    PT/INR Recent Labs    02/18/23 2012  LABPROT 13.3  INR 1.0   Hepatitis Panel No results for input(s): "HEPBSAG", "HCVAB", "HEPAIGM", "HEPBIGM" in the last 72 hours.  Studies/Results US Abdomen Limited  Result Date: 02/18/2023 CLINICAL DATA:  Right upper quadrant pain EXAM: ULTRASOUND ABDOMEN LIMITED RIGHT UPPER QUADRANT COMPARISON:  CT abdomen and pelvis 02/18/2023 FINDINGS: Gallbladder: Multiple mobile calcifications are present measuring up to 1.5 cm. The gallbladder wall is mildly thickened measuring 4 mm. There is no pericholecystic fluid. No sonographic Murphy sign noted by sonographer. Common bile duct: Diameter: 5 mm Liver: No focal lesion identified. Increase in parenchymal echogenicity. Portal vein is patent on color Doppler imaging with normal direction of blood flow towards the liver. Other: None. IMPRESSION: 1. Cholelithiasis with mild gallbladder wall thickening. No pericholecystic fluid or sonographic Murphy sign. Findings are equivocal for acute cholecystitis. 2. Increased echogenicity of the hepatic parenchyma is a nonspecific indicator of hepatocellular dysfunction, most commonly steatosis. Electronically Signed   By: Darliss Cheney M.D.   On: 02/18/2023 19:14   CT ABDOMEN PELVIS W CONTRAST  Result Date: 02/18/2023 CLINICAL DATA:  Acute abdominal pain.  Recent colonoscopy. EXAM: CT ABDOMEN AND PELVIS WITH CONTRAST TECHNIQUE: Multidetector CT imaging of the abdomen and pelvis was performed using the standard protocol following bolus administration of intravenous contrast. RADIATION DOSE REDUCTION: This exam was performed according to the departmental dose-optimization program which  includes automated exposure control, adjustment of the mA and/or kV according to patient size and/or use of iterative reconstruction technique. CONTRAST:  OMNIPAQUE IOHEXOL 300 MG/ML  SOLN COMPARISON:  None Available. FINDINGS: Lower chest: No acute abnormality. Hepatobiliary: Gallstones are  present. There is gallbladder wall edema/thickening. There is no biliary ductal dilatation. The liver is within normal limits. Pancreas: Unremarkable. No pancreatic ductal dilatation or surrounding inflammatory changes. Spleen: Normal in size without focal abnormality. Adrenals/Urinary Tract: There is a subcentimeter hypodensity in the left kidney which is favored as cysts. Otherwise, the adrenal glands, kidneys and bladder are within normal limits. Bladder distention is noted. Stomach/Bowel: Stomach is within normal limits. Appendix appears normal. No evidence of bowel wall thickening, distention, or inflammatory changes. Vascular/Lymphatic: No significant vascular findings are present. No enlarged abdominal or pelvic lymph nodes. Reproductive: Status post hysterectomy. No adnexal masses. Other: No abdominal wall hernia or abnormality. No abdominopelvic ascites. Musculoskeletal: There are degenerative changes at L5-S1. IMPRESSION: 1. Cholelithiasis with gallbladder wall thickening/edema. Findings are concerning for acute cholecystitis. Consider further evaluation with ultrasound. 2. Bladder distention. 3. Status post hysterectomy 4. Subcentimeter left Bosniak I benign renal cyst. No follow-up imaging is recommended. JACR 2018 Feb; 264-273, Management of the Incidental Renal Mass on CT, [employer] 2021; 814-848, Bosniak Classification of Cystic Renal Masses, Version 2019. Electronically Signed   By: Darliss Cheney M.D.   On: 02/18/2023 17:29    Assessment  32 y.o. female with a history of degenerative disc disease, HTN, hypothyroidism, IDA, rheumatoid arthritis, and ADHD who presented to the hospital for worsening abdominal pain and rectal bleeding post colonoscopy.  Found to have acute cholecystitis and underwent cholecystectomy yesterday 9/25.  GI consulted given reports of rectal bleeding.  Rectal bleeding: Underwent EGD and colonoscopy 02/17/2023.  She had removal of 3 polyps within the rectum and sigmoid  colon and evidence of internal and external hemorrhoids.  EGD with normal esophagus and gastritis s/p biopsy, scalp mucosa in the duodenum s/p biopsy to rule out celiac.  She developed some rectal bleeding day after procedure but this has since subsided.  Hemoglobin 13.1 on admission with slight drop to 11.9, likely somewhat hemodilutional.  Likely was experiencing post polypectomy bleed which has now resolved, no need for further endoscopic evaluation at this time.  She received IV fluids and antibiotics for cholecystectomy.  Plan / Recommendations  CBC in 1 week Follow up in the office in 3-4 weeks Dr. Tasia Catchings will follow-up on pathology Continue daily PPI    LOS: 0 days    02/20/2023, 11:59 AM   Brooke Bonito, MSN, FNP-BC, AGACNP-BC [facility]

## 2023-02-20 NOTE — Telephone Encounter (Signed)
Please have patient complete a CBC next week. Dx: rectal bleeding, anemia  Brooke Bonito, MSN, APRN, FNP-BC, AGACNP-BC [facility]

## 2023-02-20 NOTE — Progress Notes (Signed)
[facility] Progress Note  1 Day Post-Op  Subjective: Patient seen and examined.  She is resting comfortably in bed.  She has abdominal soreness, but states that her right upper quadrant abdominal pain has significantly improved.  She was able to tolerate a diet without nausea and vomiting.  She was also able to void after Foley removal.  Pain is well-controlled with oral medications.  Objective: Vital signs in last 24 hours: Temp:  [96.9 F (36.1 C)-98.1 F (36.7 C)] 98.1 F (36.7 C) (09/26 0335) Pulse Rate:  [51-81] 51 (09/26 0335) Resp:  [0-21] 17 (09/26 0335) BP: (105-129)/(58-82) 107/64 (09/26 0335) SpO2:  [89 %-100 %] 96 % (09/26 0849) Last BM Date : 02/18/23  Intake/Output from previous day: 09/25 0701 - 09/26 0700 In: 2034.6 [P.O.:240; I.V.:1494.6; IV Piggyback:300] Out: 600 [Urine:600] Intake/Output this shift: Total I/O In: 240 [P.O.:240] Out: -   General appearance: alert, cooperative, and no distress GI: Abdomen soft, nondistended, no percussion tenderness, mild incisional and right upper quadrant tenderness to palpation; no rigidity, guarding, rebound tenderness  Lab Results:  Recent Labs    02/19/23 0439 02/20/23 0448  WBC 8.7 10.6*  HGB 11.8* 11.9*  HCT 35.8* 36.6  PLT 324 328   BMET Recent Labs    02/19/23 0439 02/20/23 0448  NA 139 137  K 3.1* 3.8  CL 103 104  CO2 27 25  GLUCOSE 86 113*  BUN <5* <5*  CREATININE 0.79 0.72  CALCIUM 8.2* 8.0*   PT/INR Recent Labs    02/18/23 2012  LABPROT 13.3  INR 1.0    Studies/Results: US Abdomen Limited  Result Date: 02/18/2023 CLINICAL DATA:  Right upper quadrant pain EXAM: ULTRASOUND ABDOMEN LIMITED RIGHT UPPER QUADRANT COMPARISON:  CT abdomen and pelvis 02/18/2023 FINDINGS: Gallbladder: Multiple mobile calcifications are present measuring up to 1.5 cm. The gallbladder wall is mildly thickened measuring 4 mm. There is no pericholecystic fluid. No sonographic Murphy sign noted by  sonographer. Common bile duct: Diameter: 5 mm Liver: No focal lesion identified. Increase in parenchymal echogenicity. Portal vein is patent on color Doppler imaging with normal direction of blood flow towards the liver. Other: None. IMPRESSION: 1. Cholelithiasis with mild gallbladder wall thickening. No pericholecystic fluid or sonographic Murphy sign. Findings are equivocal for acute cholecystitis. 2. Increased echogenicity of the hepatic parenchyma is a nonspecific indicator of hepatocellular dysfunction, most commonly steatosis. Electronically Signed   By: Darliss Cheney M.D.   On: 02/18/2023 19:14   CT ABDOMEN PELVIS W CONTRAST  Result Date: 02/18/2023 CLINICAL DATA:  Acute abdominal pain.  Recent colonoscopy. EXAM: CT ABDOMEN AND PELVIS WITH CONTRAST TECHNIQUE: Multidetector CT imaging of the abdomen and pelvis was performed using the standard protocol following bolus administration of intravenous contrast. RADIATION DOSE REDUCTION: This exam was performed according to the departmental dose-optimization program which includes automated exposure control, adjustment of the mA and/or kV according to patient size and/or use of iterative reconstruction technique. CONTRAST:  OMNIPAQUE IOHEXOL 300 MG/ML  SOLN COMPARISON:  None Available. FINDINGS: Lower chest: No acute abnormality. Hepatobiliary: Gallstones are present. There is gallbladder wall edema/thickening. There is no biliary ductal dilatation. The liver is within normal limits. Pancreas: Unremarkable. No pancreatic ductal dilatation or surrounding inflammatory changes. Spleen: Normal in size without focal abnormality. Adrenals/Urinary Tract: There is a subcentimeter hypodensity in the left kidney which is favored as cysts. Otherwise, the adrenal glands, kidneys and bladder are within normal limits. Bladder distention is noted. Stomach/Bowel: Stomach is within normal limits. Appendix  appears normal. No evidence of bowel wall thickening, distention, or  inflammatory changes. Vascular/Lymphatic: No significant vascular findings are present. No enlarged abdominal or pelvic lymph nodes. Reproductive: Status post hysterectomy. No adnexal masses. Other: No abdominal wall hernia or abnormality. No abdominopelvic ascites. Musculoskeletal: There are degenerative changes at L5-S1. IMPRESSION: 1. Cholelithiasis with gallbladder wall thickening/edema. Findings are concerning for acute cholecystitis. Consider further evaluation with ultrasound. 2. Bladder distention. 3. Status post hysterectomy 4. Subcentimeter left Bosniak I benign renal cyst. No follow-up imaging is recommended. JACR 2018 Feb; 264-273, Management of the Incidental Renal Mass on CT, [employer] 2021; 814-848, Bosniak Classification of Cystic Renal Masses, Version 2019. Electronically Signed   By: Darliss Cheney M.D.   On: 02/18/2023 17:29    Anti-infectives: Anti-infectives (From admission, onward)    Start     Dose/Rate Route Frequency Ordered Stop   02/19/23 0200  Ampicillin-Sulbactam (UNASYN) 3 g in sodium chloride 0.9 % 100 mL IVPB        3 g 200 mL/hr over 30 Minutes Intravenous Every 6 hours 02/18/23 2016     02/18/23 1800  piperacillin-tazobactam (ZOSYN) IVPB 3.375 g  Status:  Discontinued        3.375 g 12.5 mL/hr over 240 Minutes Intravenous Every 8 hours 02/18/23 1747 02/18/23 2016       Assessment/Plan:  Patient is a 32 year old female who was admitted with concern for acute cholecystitis and is status post robotic assisted laparoscopic cholecystectomy on 9/25.  -Patient with very mild leukocytosis of 10.6 today.  Likely reactive from surgery -No need for antibiotics at discharge -Patient tolerating regular diet -Stable for discharge home from general surgery standpoint -Follow-up with me in 2 weeks   LOS: 0 days    Mattison Stuckey A Heidi Maclin 02/20/2023

## 2023-02-20 NOTE — Telephone Encounter (Signed)
Pt discharged today. I'm off tomorrow so I will call patient on Monday to let her know she needs labs. Orders put in for [facility].

## 2023-02-23 LAB — CULTURE, BLOOD (ROUTINE X 2)
Culture: NO GROWTH
Culture: NO GROWTH

## 2023-02-23 NOTE — Progress Notes (Signed)
I reviewed the pathology results. Ann, can you send her a letter with the findings as described below please? Repeat colonoscopy at age 32  Thanks,  Vista Lawman, MD Gastroenterology and Hepatology [facility]  ---------------------------------------------------------------------------------------------  [facility] [address], [address], [place], [place] [address] Phone:  [phone]   02/23/23 , [place]   Dear Kristin Baker,  I am writing to inform you that the biopsies taken during your recent endoscopic examination showed:  Upper endoscopy:  No H. Pylori bacteria in stomach , or any early cancer changes to the stomach mucosa ( Intestinal metaplasia)  Normal biopsies of the small bowel ( No Celiac disease)  Colonoscopy   I am writing to let you know the results of your recent colonoscopy.  You had a total of 3 polyps removed. The pathology came back as "hyperplastic polyp." These findings are NOT cancer and do not turn into cancer. Given these findings, it is recommended that your next colonoscopy be performed at age 57 .   Please call us at [phone] if you have persistent problems or have questions about your condition that have not been fully answered at this time.  Sincerely,  Vista Lawman, MD Gastroenterology and Hepatology

## 2023-02-24 NOTE — Telephone Encounter (Signed)
Pt notified to do labs this week at [facility].

## 2023-02-24 NOTE — Anesthesia Postprocedure Evaluation (Signed)
Anesthesia Post Note  Patient: Kristin Baker  Procedure(s) Performed: XI ROBOTIC ASSISTED LAPAROSCOPIC CHOLECYSTECTOMY  Patient location during evaluation: Phase II Anesthesia Type: General Level of consciousness: awake Pain management: pain level controlled Vital Signs Assessment: post-procedure vital signs reviewed and stable Respiratory status: spontaneous breathing and respiratory function stable Cardiovascular status: blood pressure returned to baseline and stable Postop Assessment: no headache and no apparent nausea or vomiting Anesthetic complications: no Comments: Late entry   No notable events documented.   Last Vitals:  Vitals:   02/20/23 0335 02/20/23 0849  BP: 107/64   Pulse: (!) 51   Resp: 17   Temp: 36.7 C   SpO2: 95% 96%    Last Pain:  Vitals:   02/20/23 0903  TempSrc:   PainSc: 5                  Windell Norfolk

## 2023-02-25 ENCOUNTER — Encounter (INDEPENDENT_AMBULATORY_CARE_PROVIDER_SITE_OTHER): Payer: Self-pay | Admitting: *Deleted

## 2023-02-26 ENCOUNTER — Encounter (HOSPITAL_COMMUNITY): Payer: Self-pay | Admitting: Gastroenterology

## 2023-02-27 ENCOUNTER — Other Ambulatory Visit: Payer: Self-pay

## 2023-02-27 ENCOUNTER — Inpatient Hospital Stay (HOSPITAL_COMMUNITY)
Admission: EM | Admit: 2023-02-27 | Discharge: 2023-03-01 | DRG: 948 | Disposition: A | Discharge status: Other Acute Inpatient Hospital | Payer: Medicaid Other | Attending: General Surgery | Admitting: General Surgery

## 2023-02-27 ENCOUNTER — Emergency Department (HOSPITAL_COMMUNITY): Payer: Medicaid Other

## 2023-02-27 ENCOUNTER — Telehealth: Payer: Self-pay | Admitting: *Deleted

## 2023-02-27 ENCOUNTER — Encounter (HOSPITAL_COMMUNITY): Payer: Self-pay

## 2023-02-27 DIAGNOSIS — Z83438 Family history of other disorder of lipoprotein metabolism and other lipidemia: Secondary | ICD-10-CM

## 2023-02-27 DIAGNOSIS — R7401 Elevation of levels of liver transaminase levels: Secondary | ICD-10-CM | POA: Diagnosis present

## 2023-02-27 DIAGNOSIS — R1011 Right upper quadrant pain: Secondary | ICD-10-CM | POA: Diagnosis not present

## 2023-02-27 DIAGNOSIS — Z9109 Other allergy status, other than to drugs and biological substances: Secondary | ICD-10-CM

## 2023-02-27 DIAGNOSIS — Z8719 Personal history of other diseases of the digestive system: Secondary | ICD-10-CM

## 2023-02-27 DIAGNOSIS — I10 Essential (primary) hypertension: Secondary | ICD-10-CM | POA: Diagnosis present

## 2023-02-27 DIAGNOSIS — Z888 Allergy status to other drugs, medicaments and biological substances status: Secondary | ICD-10-CM

## 2023-02-27 DIAGNOSIS — Z8049 Family history of malignant neoplasm of other genital organs: Secondary | ICD-10-CM

## 2023-02-27 DIAGNOSIS — Z808 Family history of malignant neoplasm of other organs or systems: Secondary | ICD-10-CM

## 2023-02-27 DIAGNOSIS — E66813 Obesity, class 3: Secondary | ICD-10-CM | POA: Diagnosis present

## 2023-02-27 DIAGNOSIS — Z6841 Body Mass Index (BMI) 40.0 and over, adult: Secondary | ICD-10-CM

## 2023-02-27 DIAGNOSIS — Z79899 Other long term (current) drug therapy: Secondary | ICD-10-CM

## 2023-02-27 DIAGNOSIS — Z803 Family history of malignant neoplasm of breast: Secondary | ICD-10-CM

## 2023-02-27 DIAGNOSIS — Z79890 Hormone replacement therapy (postmenopausal): Secondary | ICD-10-CM

## 2023-02-27 DIAGNOSIS — Z8249 Family history of ischemic heart disease and other diseases of the circulatory system: Secondary | ICD-10-CM

## 2023-02-27 DIAGNOSIS — R7989 Other specified abnormal findings of blood chemistry: Secondary | ICD-10-CM | POA: Insufficient documentation

## 2023-02-27 DIAGNOSIS — Z818 Family history of other mental and behavioral disorders: Secondary | ICD-10-CM

## 2023-02-27 DIAGNOSIS — Z90710 Acquired absence of both cervix and uterus: Secondary | ICD-10-CM

## 2023-02-27 DIAGNOSIS — Z833 Family history of diabetes mellitus: Secondary | ICD-10-CM

## 2023-02-27 DIAGNOSIS — Z9049 Acquired absence of other specified parts of digestive tract: Secondary | ICD-10-CM

## 2023-02-27 DIAGNOSIS — M069 Rheumatoid arthritis, unspecified: Secondary | ICD-10-CM | POA: Diagnosis present

## 2023-02-27 DIAGNOSIS — K8051 Calculus of bile duct without cholangitis or cholecystitis with obstruction: Secondary | ICD-10-CM | POA: Diagnosis present

## 2023-02-27 DIAGNOSIS — F909 Attention-deficit hyperactivity disorder, unspecified type: Secondary | ICD-10-CM | POA: Diagnosis present

## 2023-02-27 DIAGNOSIS — E739 Lactose intolerance, unspecified: Secondary | ICD-10-CM | POA: Diagnosis present

## 2023-02-27 DIAGNOSIS — E039 Hypothyroidism, unspecified: Secondary | ICD-10-CM | POA: Diagnosis present

## 2023-02-27 DIAGNOSIS — F17290 Nicotine dependence, other tobacco product, uncomplicated: Secondary | ICD-10-CM | POA: Diagnosis present

## 2023-02-27 DIAGNOSIS — G8918 Other acute postprocedural pain: Principal | ICD-10-CM | POA: Diagnosis present

## 2023-02-27 DIAGNOSIS — Z820 Family history of epilepsy and other diseases of the nervous system: Secondary | ICD-10-CM

## 2023-02-27 DIAGNOSIS — K8050 Calculus of bile duct without cholangitis or cholecystitis without obstruction: Secondary | ICD-10-CM | POA: Diagnosis present

## 2023-02-27 LAB — COMPREHENSIVE METABOLIC PANEL
ALT: 123 U/L — ABNORMAL HIGH (ref 0–44)
AST: 252 U/L — ABNORMAL HIGH (ref 15–41)
Albumin: 3.7 g/dL (ref 3.5–5.0)
Alkaline Phosphatase: 209 U/L — ABNORMAL HIGH (ref 38–126)
Anion gap: 9 (ref 5–15)
BUN: <5 mg/dL — ABNORMAL LOW (ref 6–20)
CO2: 25 mmol/L (ref 22–32)
Calcium: 9 mg/dL (ref 8.9–10.3)
Chloride: 103 mmol/L (ref 98–111)
Creatinine, Ser: 0.8 mg/dL (ref 0.44–1.00)
GFR, Estimated: >60 mL/min (ref >60)
Glucose, Bld: 99 mg/dL (ref 70–99)
Potassium: 3.6 mmol/L (ref 3.5–5.1)
Sodium: 137 mmol/L (ref 135–145)
Total Bilirubin: 0.8 mg/dL (ref 0.3–1.2)
Total Protein: 7.5 g/dL (ref 6.5–8.1)

## 2023-02-27 LAB — URINALYSIS, ROUTINE W REFLEX MICROSCOPIC
Bilirubin Urine: NEGATIVE
Glucose, UA: NEGATIVE mg/dL
Hgb urine dipstick: NEGATIVE
Ketones, ur: NEGATIVE mg/dL
Leukocytes,Ua: NEGATIVE
Nitrite: NEGATIVE
Protein, ur: NEGATIVE mg/dL
Specific Gravity, Urine: 1.017 (ref 1.005–1.030)
pH: 7 (ref 5.0–8.0)

## 2023-02-27 LAB — ACETAMINOPHEN LEVEL: Acetaminophen (Tylenol), Serum: 17 ug/mL (ref 10–30)

## 2023-02-27 LAB — LIPASE, BLOOD: Lipase: 22 U/L (ref 11–51)

## 2023-02-27 LAB — CBC
HCT: 38.7 % (ref 36.0–46.0)
Hemoglobin: 13.2 g/dL (ref 12.0–15.0)
MCH: 30.4 pg (ref 26.0–34.0)
MCHC: 34.1 g/dL (ref 30.0–36.0)
MCV: 89.2 fL (ref 80.0–100.0)
Platelets: 459 10*3/uL — ABNORMAL HIGH (ref 150–400)
RBC: 4.34 MIL/uL (ref 3.87–5.11)
RDW: 13.7 % (ref 11.5–15.5)
WBC: 13.5 10*3/uL — ABNORMAL HIGH (ref 4.0–10.5)
nRBC: 0 % (ref 0.0–0.2)

## 2023-02-27 MED ORDER — IBUPROFEN 600 MG PO TABS: 600.0000 mg | ORAL_TABLET | Freq: Four times a day (QID) | ORAL | Status: DC | PRN

## 2023-02-27 MED ORDER — MORPHINE SULFATE (PF) 4 MG/ML IV SOLN
4.0000 mg | Freq: Once | INTRAVENOUS | Status: AC
  Administered 2023-02-27: 4 mg via INTRAVENOUS
  Filled 2023-02-27: qty 1

## 2023-02-27 MED ORDER — DIPHENHYDRAMINE HCL 12.5 MG/5ML PO ELIX: 12.5000 mg | ORAL_SOLUTION | Freq: Four times a day (QID) | ORAL | Status: DC | PRN

## 2023-02-27 MED ORDER — ONDANSETRON 4 MG PO TBDP
4.0000 mg | ORAL_TABLET | Freq: Once | ORAL | Status: AC
  Administered 2023-02-27: 4 mg via ORAL
  Filled 2023-02-27: qty 1

## 2023-02-27 MED ORDER — LACTATED RINGERS IV SOLN: INTRAVENOUS | Status: DC

## 2023-02-27 MED ORDER — ONDANSETRON HCL 4 MG/2ML IJ SOLN
4.0000 mg | Freq: Four times a day (QID) | INTRAMUSCULAR | Status: DC | PRN
  Administered 2023-03-01 (×2): 4 mg via INTRAVENOUS
  Filled 2023-02-27 (×2): qty 2

## 2023-02-27 MED ORDER — PANTOPRAZOLE SODIUM 40 MG IV SOLR
40.0000 mg | Freq: Every day | INTRAVENOUS | Status: DC
  Administered 2023-02-27 – 2023-02-28 (×2): 40 mg via INTRAVENOUS
  Filled 2023-02-27 (×2): qty 10

## 2023-02-27 MED ORDER — MORPHINE SULFATE (PF) 2 MG/ML IV SOLN
2.0000 mg | INTRAVENOUS | Status: DC | PRN
  Administered 2023-02-27 – 2023-03-01 (×8): 2 mg via INTRAVENOUS
  Filled 2023-02-27 (×9): qty 1

## 2023-02-27 MED ORDER — METHOCARBAMOL 1000 MG/10ML IJ SOLN: 500.0000 mg | Freq: Three times a day (TID) | INTRAVENOUS | Status: DC | PRN

## 2023-02-27 MED ORDER — SIMETHICONE 80 MG PO CHEW
40.0000 mg | CHEWABLE_TABLET | Freq: Four times a day (QID) | ORAL | Status: DC | PRN
  Administered 2023-02-27: 40 mg via ORAL
  Filled 2023-02-27: qty 1

## 2023-02-27 MED ORDER — METOPROLOL TARTRATE 5 MG/5ML IV SOLN: 5.0000 mg | Freq: Four times a day (QID) | INTRAVENOUS | Status: DC | PRN

## 2023-02-27 MED ORDER — ONDANSETRON 4 MG PO TBDP
4.0000 mg | ORAL_TABLET | Freq: Four times a day (QID) | ORAL | Status: DC | PRN
  Administered 2023-02-28: 4 mg via ORAL
  Filled 2023-02-27: qty 1

## 2023-02-27 MED ORDER — DIPHENHYDRAMINE HCL 50 MG/ML IJ SOLN: 12.5000 mg | Freq: Four times a day (QID) | INTRAMUSCULAR | Status: DC | PRN

## 2023-02-27 MED ORDER — METHOCARBAMOL 500 MG PO TABS: 500.0000 mg | ORAL_TABLET | Freq: Three times a day (TID) | ORAL | Status: DC | PRN

## 2023-02-27 MED ORDER — ZOLPIDEM TARTRATE 5 MG PO TABS: 5.0000 mg | ORAL_TABLET | Freq: Every evening | ORAL | Status: DC | PRN

## 2023-02-27 MED ORDER — OXYCODONE HCL 5 MG PO TABS
5.0000 mg | ORAL_TABLET | ORAL | Status: DC | PRN
  Administered 2023-02-27 – 2023-02-28 (×3): 10 mg via ORAL
  Filled 2023-02-27 (×3): qty 2

## 2023-02-27 MED ORDER — IOHEXOL 300 MG/ML  SOLN
100.0000 mL | Freq: Once | INTRAMUSCULAR | Status: AC | PRN
  Administered 2023-02-27: 100 mL via INTRAVENOUS

## 2023-02-27 MED ORDER — DOCUSATE SODIUM 100 MG PO CAPS
100.0000 mg | ORAL_CAPSULE | Freq: Two times a day (BID) | ORAL | Status: DC
  Administered 2023-02-27 – 2023-03-01 (×3): 100 mg via ORAL
  Filled 2023-02-27 (×4): qty 1

## 2023-02-27 NOTE — ED Provider Notes (Signed)
[facility] AT [facility] Provider Note   CSN: [id number] Arrival date & time: 02/27/23  1038     History  Chief Complaint  Patient presents with   Abdominal Pain    Kristin Baker is a 32 y.o. female.  Pt is a 32 yo female with pmhx significant for anemia, RA, adhd, htn, hypothyroidism, obesity, and chronic back pain.  Pt presents to the ED today with abd pain.  Pt did have a cholecystectomy on 9/25 by Dr. Robyne Peers.  Pt has been having pain since then.  Occasional fevers.  + nausea.  Pt did call the office today and was told to come to the ED.  She has been taking tylenol, but only 1 g every 6 hrs.  It is not helping.       Home Medications Prior to Admission medications   Medication Sig Start Date End Date Taking? Authorizing Provider  acetaminophen (TYLENOL) 500 MG tablet Take 2 tablets (1,000 mg total) by mouth every 6 (six) hours. 02/19/23   Pappayliou, Santina Evans A, DO  albuterol (VENTOLIN HFA) 108 (90 Base) MCG/ACT inhaler Inhale 2 puffs into the lungs every 6 (six) hours as needed for wheezing or shortness of breath. 10/23/22   Gilmore Laroche, FNP  amphetamine-dextroamphetamine (ADDERALL XR) 20 MG 24 hr capsule Take 20 mg by mouth daily. 04/02/22   [provider]  amphetamine-dextroamphetamine (ADDERALL) 5 MG tablet Take 5 mg by mouth daily in the afternoon.    [provider]  azelastine (ASTELIN) 0.1 % nasal spray 2 sprays per nostril 1-2 times daily as needed. 07/18/21   [name], MD  Cholecalciferol (VITAMIN D3) 25 MCG (1000 UT) CAPS Take 1 capsule (1,000 Units total) by mouth daily. 08/27/21   [name], Baird Kay, FNP  docusate sodium (COLACE) 100 MG capsule Take 1 capsule (100 mg total) by mouth 2 (two) times daily. 02/19/23 02/19/24  Pappayliou, Santina Evans A, DO  FLUoxetine (PROZAC) 10 MG capsule Take 10 mg by mouth daily. 10/22/22   [provider]  Galcanezumab-gnlm (EMGALITY) 120 MG/ML SOAJ Inject 1  pen  into the skin every 30 (thirty) days. 06/12/22   Lomax, Amy, NP  levocetirizine (XYZAL) 5 MG tablet Take 1 tablet (5 mg total) by mouth daily. 09/23/22 02/18/23  Gilmore Laroche, FNP  levothyroxine (SYNTHROID) 100 MCG tablet Take 1 tablet (100 mcg total) by mouth daily. 01/25/23   Gilmore Laroche, FNP  olmesartan-hydrochlorothiazide (BENICAR HCT) 20-12.5 MG tablet TAKE 1 TABLET BY MOUTH DAILY 11/25/22   Gilmore Laroche, FNP  omeprazole (PRILOSEC) 40 MG capsule Take 1 capsule (40 mg total) by mouth daily. 02/01/23   Derwood Kaplan, MD  ondansetron (ZOFRAN) 4 MG tablet Take 1 tablet (4 mg total) by mouth daily as needed for nausea or vomiting. 02/19/23 02/19/24  Pappayliou, Santina Evans A, DO  oxyCODONE (ROXICODONE) 5 MG immediate release tablet Take 1 tablet (5 mg total) by mouth every 6 (six) hours as needed. 02/19/23   Pappayliou, Santina Evans A, DO  psyllium (METAMUCIL) 58.6 % packet Take 1 packet by mouth 2 (two) times daily. 01/22/23 04/22/23  Ahmed, Juanetta Beets, MD  sucralfate (CARAFATE) 1 g tablet Take 1 tablet (1 g total) by mouth 4 (four) times daily -  with meals and at bedtime. 02/01/23   Derwood Kaplan, MD  tiZANidine (ZANAFLEX) 4 MG tablet TAKE 1 TABLET(4 MG) BY MOUTH AT BEDTIME AS NEEDED FOR MUSCLE SPASMS 02/02/23   Gilmore Laroche, FNP  triamcinolone (NASACORT) 55 MCG/ACT AERO nasal inhaler  Place 1 spray into the nose daily. 07/18/21   [name], MD      Allergies    Poison ivy extract, Poison oak extract, Anser anser feather (goose) allergy skin test, Flexeril [cyclobenzaprine], Lactose intolerance (gi), Honeysuckle flower [lonicera], and Mixed grasses    Review of Systems   Review of Systems  Constitutional:  Positive for fever.  Gastrointestinal:  Positive for abdominal pain and nausea.  All other systems reviewed and are negative.   Physical Exam Updated Vital Signs BP 139/80 (BP Location: Left Arm)   Pulse 71   Temp 98.1 F (36.7 C) (Oral)   Resp 20   Wt 128 kg   LMP  03/10/2022 (Approximate)   SpO2 96%   BMI 44.20 kg/m  Physical Exam Vitals and nursing note reviewed.  Constitutional:      Appearance: She is well-developed. She is obese.  HENT:     Head: Normocephalic and atraumatic.     Mouth/Throat:     Mouth: Mucous membranes are moist.     Pharynx: Oropharynx is clear.  Eyes:     Extraocular Movements: Extraocular movements intact.     Pupils: Pupils are equal, round, and reactive to light.  Cardiovascular:     Rate and Rhythm: Normal rate and regular rhythm.     Heart sounds: Normal heart sounds.  Pulmonary:     Effort: Pulmonary effort is normal.     Breath sounds: Normal breath sounds.  Abdominal:     General: Abdomen is flat. Bowel sounds are normal.     Palpations: Abdomen is soft.     Tenderness: There is abdominal tenderness in the right upper quadrant and epigastric area.  Skin:    General: Skin is warm.     Capillary Refill: Capillary refill takes less than 2 seconds.  Neurological:     General: No focal deficit present.     Mental Status: She is alert and oriented to person, place, and time.     ED Results / Procedures / Treatments   Labs (all labs ordered are listed, but only abnormal results are displayed) Labs Reviewed  COMPREHENSIVE METABOLIC PANEL - Abnormal; Notable for the following components:      Result Value   BUN <5 (*)    AST 252 (*)    ALT 123 (*)    Alkaline Phosphatase 209 (*)    All other components within normal limits  CBC - Abnormal; Notable for the following components:   WBC 13.5 (*)    Platelets 459 (*)    All other components within normal limits  LIPASE, BLOOD  ACETAMINOPHEN LEVEL  URINALYSIS, ROUTINE W REFLEX MICROSCOPIC    EKG None  Radiology CT ABDOMEN PELVIS W CONTRAST  Result Date: 02/27/2023 CLINICAL DATA:  Right-sided abdominal pain radiating to the right flank and back. Recent cholecystectomy eight days ago. EXAM: CT ABDOMEN AND PELVIS WITH CONTRAST TECHNIQUE: Multidetector  CT imaging of the abdomen and pelvis was performed using the standard protocol following bolus administration of intravenous contrast. RADIATION DOSE REDUCTION: This exam was performed according to the departmental dose-optimization program which includes automated exposure control, adjustment of the mA and/or kV according to patient size and/or use of iterative reconstruction technique. CONTRAST:  OMNIPAQUE IOHEXOL 300 MG/ML  SOLN COMPARISON:  CT abdomen pelvis dated February 18, 2023. FINDINGS: Lower chest: No acute abnormality. Hepatobiliary: No focal liver abnormality is seen. Status post cholecystectomy with new surgical clips and mild stranding in the gallbladder fossa. No  fluid collection. No biliary dilatation. Pancreas: Unremarkable. No pancreatic ductal dilatation or surrounding inflammatory changes. Spleen: Normal in size without focal abnormality. Adrenals/Urinary Tract: Adrenal glands are unremarkable. Kidneys are normal, without renal calculi, focal lesion, or hydronephrosis. Bladder is unremarkable. Stomach/Bowel: Stomach is within normal limits. Appendix appears normal. No evidence of bowel wall thickening, distention, or inflammatory changes. Vascular/Lymphatic: No significant vascular findings are present. No enlarged abdominal or pelvic lymph nodes. Reproductive: Status post hysterectomy. 2.7 cm right ovarian cyst. No follow-up imaging is recommended. Normal left ovary. Other: Trace free fluid in the pelvis.  No pneumoperitoneum. Musculoskeletal: No acute or significant osseous findings. IMPRESSION: 1. Status post cholecystectomy without evident complication. No acute intra-abdominal process. Electronically Signed   By: Obie Dredge M.D.   On: 02/27/2023 16:46    Procedures Procedures    Medications Ordered in ED Medications  ondansetron (ZOFRAN-ODT) disintegrating tablet 4 mg (4 mg Oral Given 02/27/23 1447)  morphine (PF) 4 MG/ML injection 4 mg (4 mg Intravenous Given 02/27/23  1532)  iohexol (OMNIPAQUE) 300 MG/ML solution 100 mL (100 mLs Intravenous Contrast Given 02/27/23 1624)    ED Course/ Medical Decision Making/ A&P                                 Medical Decision Making Amount and/or Complexity of Data Reviewed Labs: ordered. Radiology: ordered.  Risk Prescription drug management. Decision regarding hospitalization.   This patient presents to the ED for concern of abd pain, this involves an extensive number of treatment options, and is a complaint that carries with it a high risk of complications and morbidity.  The differential diagnosis includes post op complication, tylenol od, constipation, sbo   Co morbidities that complicate the patient evaluation  anemia, RA, adhd, htn, hypothyroidism, obesity, and chronic back pain   Additional history obtained:  Additional history obtained from epic chart review  Lab Tests:  I Ordered, and personally interpreted labs.  The pertinent results include:  cbc with wbc elevated at 13.5, cmp with ast elevated at 252, alt elevated at 123, ap elevated at 209.  They were normal a week ago. Lip nl   Imaging Studies ordered:  I ordered imaging studies including ct abd/pelvis  I independently visualized and interpreted imaging which showed  Status post cholecystectomy without evident complication. No  acute intra-abdominal process.   I agree with the radiologist interpretation   Cardiac Monitoring:  The patient was maintained on a cardiac monitor.  I personally viewed and interpreted the cardiac monitored which showed an underlying rhythm of: nsr   Medicines ordered and prescription drug management:  I ordered medication including morphine/zofran  for sx  Reevaluation of the patient after these medicines showed that the patient improved I have reviewed the patients home medicines and have made adjustments as needed   Test Considered:  ct   Critical Interventions:  Pain  control   Consultations Obtained:  I requested consultation with the surgeon (Dr. Henreitta Leber),  and discussed lab and imaging findings as well as pertinent plan -she will admit   Problem List / ED Course:  RUQ pain s/p chole:  pt will be admitted for obs for lft watching and pain control   Reevaluation:  After the interventions noted above, I reevaluated the patient and found that they have :improved   Social Determinants of Health:  Lives at home   Dispostion:  After consideration of the diagnostic results and the  patients response to treatment, I feel that the patent would benefit from admission.          Final Clinical Impression(s) / ED Diagnoses Final diagnoses:  Right upper quadrant abdominal pain  Transaminitis    Rx / DC Orders ED Discharge Orders     None         Jacalyn Lefevre, MD 02/27/23 1718

## 2023-02-27 NOTE — ED Notes (Signed)
ED TO INPATIENT HANDOFF REPORT  ED Nurse Name and Phone #: Jacques Earthly Name/Age/Gender Kristin Baker 32 y.o. female Room/Bed: APA01/APA01  Code Status   Code Status: Full Code  Home/SNF/Other Home Patient oriented to: self, place, time, and situation Is this baseline? Yes   Triage Complete: Triage complete  Chief Complaint Post-operative pain [G89.18]  Triage Note C/o right sided abd pain that radiating to right flank and back. Patient describes pain as feeling like gas but no relief with gas-x Last BM was this am but small.  tylenol and heat w/o relief.  Pt reports patient has been present since surgery on 02/19/23 for cholecystectomy.  Tylenol at 0930.    Allergies Allergies  Allergen Reactions   Poison Ivy Extract Anaphylaxis   Poison Oak Extract Anaphylaxis   Anser Anser Feather (Goose) Allergy Skin Test    Flexeril [Cyclobenzaprine] Hives   Lactose Intolerance (Gi) Diarrhea   Honeysuckle Flower [Lonicera] Rash   Mixed Grasses Rash    Level of Care/Admitting Diagnosis ED Disposition     ED Disposition  Admit   Condition  --   Comment  Hospital Area: [facility] [[id number]]  Level of Care: Med-Surg [16]  Covid Evaluation: Asymptomatic - no recent exposure (last 10 days) testing not required  Diagnosis: Post-operative pain [309034]  Admitting Physician: Lucretia Roers [[id number]]  Attending Physician: Lucretia Roers [[id number]]          B Medical/Surgery History Past Medical History:  Diagnosis Date   Abdominal pain    ADHD    ANA positive    Back pain    DDD (degenerative disc disease), lumbar    Eye pain, right    Fatigue    Frontal sinusitis    Hypertension    Hypothyroidism    Iron deficiency anemia due to chronic blood loss 06/19/2022   Medical history non-contributory    Obesity, morbid (HCC)    Palpitations    Rheumatoid arthritis (HCC)    Past Surgical History:  Procedure Laterality Date   ABLATION      05/22/2020 left side, 06/14/2020 right side    BIOPSY  02/17/2023   Procedure: BIOPSY;  Surgeon: Franky Macho, MD;  [facility];  Service: Endoscopy;;   COLONOSCOPY WITH PROPOFOL N/A 02/17/2023   Procedure: COLONOSCOPY WITH PROPOFOL;  Surgeon: Franky Macho, MD;  Location: [facility];  Service: Endoscopy;  Laterality: N/A;  9:45am;asa 2   ESOPHAGOGASTRODUODENOSCOPY (EGD) WITH PROPOFOL N/A 02/17/2023   Procedure: ESOPHAGOGASTRODUODENOSCOPY (EGD) WITH PROPOFOL;  Surgeon: Franky Macho, MD;  Location: [facility];  Service: Endoscopy;  Laterality: N/A;  9:45am;asa 2   HYSTERECTOMY ABDOMINAL WITH SALPINGECTOMY     POLYPECTOMY  02/17/2023   Procedure: POLYPECTOMY;  Surgeon: Franky Macho, MD;  Location: [facility];  Service: Endoscopy;;   ROBOTIC ASSISTED TOTAL HYSTERECTOMY Bilateral 07/30/2022   Procedure: XI ROBOTIC ASSISTED TOTAL HYSTERECTOMY AND BILATERAL SALPINGECTOMY;  Surgeon: Lazaro Arms, MD;  Location: [facility];  Service: Gynecology;  Laterality: Bilateral;     A IV Location/Drains/Wounds Patient Lines/Drains/Airways Status     Active Line/Drains/Airways     Name Placement date Placement time Site Days   Peripheral IV 02/27/23 20 G 1" Right Antecubital 02/27/23  1527  Antecubital  less than 1   Incision - 4 Ports Abdomen Umbilicus Left;Lower Right;Lower Left;Lateral;Lower 07/30/22  0842  -- 212            Intake/Output Last 24 hours No  intake or output data in the 24 hours ending 02/27/23 1927  Labs/Imaging Results for orders placed or performed during the hospital encounter of 02/27/23 (from the past 48 hour(s))  Lipase, blood     Status: None   Collection Time: 02/27/23 12:20 PM  Result Value Ref Range   Lipase 22 11 - 51 U/L    Comment: Performed at [facility], [address], [place], [place] [address]  Comprehensive metabolic panel     Status: Abnormal   Collection Time: 02/27/23 12:20 PM  Result Value Ref Range   Sodium 137 135 -  145 mmol/L   Potassium 3.6 3.5 - 5.1 mmol/L   Chloride 103 98 - 111 mmol/L   CO2 25 22 - 32 mmol/L   Glucose, Bld 99 70 - 99 mg/dL    Comment: Glucose reference range applies only to samples taken after fasting for at least 8 hours.   BUN <5 (L) 6 - 20 mg/dL   Creatinine, Ser 1.91 0.44 - 1.00 mg/dL   Calcium 9.0 8.9 - 47.8 mg/dL   Total Protein 7.5 6.5 - 8.1 g/dL   Albumin 3.7 3.5 - 5.0 g/dL   AST 295 (H) 15 - 41 U/L   ALT 123 (H) 0 - 44 U/L   Alkaline Phosphatase 209 (H) 38 - 126 U/L   Total Bilirubin 0.8 0.3 - 1.2 mg/dL   GFR, Estimated >62 >13 mL/min    Comment: (NOTE) Calculated using the CKD-EPI Creatinine Equation (2021)    Anion gap 9 5 - 15    Comment: Performed at [facility], [address] [place], [place] [address]  CBC     Status: Abnormal   Collection Time: 02/27/23 12:20 PM  Result Value Ref Range   WBC 13.5 (H) 4.0 - 10.5 K/uL   RBC 4.34 3.87 - 5.11 MIL/uL   Hemoglobin 13.2 12.0 - 15.0 g/dL   HCT 84.6 96.2 - 95.2 %   MCV 89.2 80.0 - 100.0 fL   MCH 30.4 26.0 - 34.0 pg   MCHC 34.1 30.0 - 36.0 g/dL   RDW 84.1 32.4 - 40.1 %   Platelets 459 (H) 150 - 400 K/uL   nRBC 0.0 0.0 - 0.2 %    Comment: Performed at [facility], [address], [place], [place] [address]  Acetaminophen level     Status: None   Collection Time: 02/27/23 12:20 PM  Result Value Ref Range   Acetaminophen (Tylenol), Serum 17 10 - 30 ug/mL    Comment: (NOTE) Therapeutic concentrations vary significantly. A range of 10-30 ug/mL  may be an effective concentration for many patients. However, some  are best treated at concentrations outside of this range. Acetaminophen concentrations >150 ug/mL at 4 hours after ingestion  and >50 ug/mL at 12 hours after ingestion are often associated with  toxic reactions.  Performed at [facility], [address], [place], [place] [address]   Urinalysis, Routine w reflex microscopic -Urine, Clean Catch     Status: Abnormal   Collection Time: 02/27/23  6:39  PM  Result Value Ref Range   Color, Urine STRAW (A) YELLOW   APPearance CLEAR CLEAR   Specific Gravity, Urine 1.017 1.005 - 1.030   pH 7.0 5.0 - 8.0   Glucose, UA NEGATIVE NEGATIVE mg/dL   Hgb urine dipstick NEGATIVE NEGATIVE   Bilirubin Urine NEGATIVE NEGATIVE   Ketones, ur NEGATIVE NEGATIVE mg/dL   Protein, ur NEGATIVE NEGATIVE mg/dL   Nitrite NEGATIVE NEGATIVE   Leukocytes,Ua  NEGATIVE NEGATIVE    Comment: Performed at [facility], [address] [place], [place] [address]   CT ABDOMEN PELVIS W CONTRAST  Result Date: 02/27/2023 CLINICAL DATA:  Right-sided abdominal pain radiating to the right flank and back. Recent cholecystectomy eight days ago. EXAM: CT ABDOMEN AND PELVIS WITH CONTRAST TECHNIQUE: Multidetector CT imaging of the abdomen and pelvis was performed using the standard protocol following bolus administration of intravenous contrast. RADIATION DOSE REDUCTION: This exam was performed according to the departmental dose-optimization program which includes automated exposure control, adjustment of the mA and/or kV according to patient size and/or use of iterative reconstruction technique. CONTRAST:  OMNIPAQUE IOHEXOL 300 MG/ML  SOLN COMPARISON:  CT abdomen pelvis dated February 18, 2023. FINDINGS: Lower chest: No acute abnormality. Hepatobiliary: No focal liver abnormality is seen. Status post cholecystectomy with new surgical clips and mild stranding in the gallbladder fossa. No fluid collection. No biliary dilatation. Pancreas: Unremarkable. No pancreatic ductal dilatation or surrounding inflammatory changes. Spleen: Normal in size without focal abnormality. Adrenals/Urinary Tract: Adrenal glands are unremarkable. Kidneys are normal, without renal calculi, focal lesion, or hydronephrosis. Bladder is unremarkable. Stomach/Bowel: Stomach is within normal limits. Appendix appears normal. No evidence of bowel wall thickening, distention, or inflammatory changes. Vascular/Lymphatic: No  significant vascular findings are present. No enlarged abdominal or pelvic lymph nodes. Reproductive: Status post hysterectomy. 2.7 cm right ovarian cyst. No follow-up imaging is recommended. Normal left ovary. Other: Trace free fluid in the pelvis.  No pneumoperitoneum. Musculoskeletal: No acute or significant osseous findings. IMPRESSION: 1. Status post cholecystectomy without evident complication. No acute intra-abdominal process. Electronically Signed   By: Obie Dredge M.D.   On: 02/27/2023 16:46    Pending Labs Unresulted Labs (From admission, onward)     Start     Ordered   02/28/23 0500  Hepatic function panel  Tomorrow morning,   R        02/27/23 1757   02/28/23 0500  Magnesium  Tomorrow morning,   R        02/27/23 1757   02/28/23 0500  Phosphorus  Tomorrow morning,   R        02/27/23 1757   02/28/23 0500  CBC  Tomorrow morning,   R        02/27/23 1757            Vitals/Pain Today's Vitals   02/27/23 1600 02/27/23 1615 02/27/23 1629 02/27/23 1837  BP: (!) 141/84  139/80   Pulse: (!) 59 68 71   Resp:   20   Temp:   98.1 F (36.7 C)   TempSrc:   Oral   SpO2: 98% 98% 96%   Weight:      PainSc:    2     Isolation Precautions No active isolations  Medications Medications  lactated ringers infusion (0 mLs Intravenous Hold 02/27/23 1904)  ibuprofen (ADVIL) tablet 600 mg (has no administration in time range)  oxyCODONE (Oxy IR/ROXICODONE) immediate release tablet 5-10 mg (has no administration in time range)  morphine (PF) 2 MG/ML injection 2 mg (has no administration in time range)  methocarbamol (ROBAXIN) tablet 500 mg (has no administration in time range)    Or  methocarbamol (ROBAXIN) 500 mg in dextrose 5 % 50 mL IVPB (has no administration in time range)  diphenhydrAMINE (BENADRYL) 12.5 MG/5ML elixir 12.5 mg (has no administration in time range)    Or  diphenhydrAMINE (BENADRYL) injection 12.5 mg (has no administration in time range)  zolpidem (AMBIEN)  tablet 5 mg (has no administration in time range)  docusate sodium (COLACE) capsule 100 mg (has no administration in time range)  ondansetron (ZOFRAN-ODT) disintegrating tablet 4 mg (has no administration in time range)    Or  ondansetron (ZOFRAN) injection 4 mg (has no administration in time range)  simethicone (MYLICON) chewable tablet 40 mg (has no administration in time range)  pantoprazole (PROTONIX) injection 40 mg (has no administration in time range)  metoprolol tartrate (LOPRESSOR) injection 5 mg (has no administration in time range)  ondansetron (ZOFRAN-ODT) disintegrating tablet 4 mg (4 mg Oral Given 02/27/23 1447)  morphine (PF) 4 MG/ML injection 4 mg (4 mg Intravenous Given 02/27/23 1532)  iohexol (OMNIPAQUE) 300 MG/ML solution 100 mL (100 mLs Intravenous Contrast Given 02/27/23 1624)    Mobility walks     Focused Assessments    R Recommendations: See Admitting Provider Note  Report given to:   Additional Notes: A&O; Ambu; 20G RAC

## 2023-02-27 NOTE — Progress Notes (Signed)
[facility]  Patient with CT that was normal. LFTs are elevated but has been on tylenol, small Bm reported.  Admit overnight for monitoring LFTs in the AM No tylenol  Colace   Will see in the AM  PRN For pain LR @ 75  Diet as tolerated   Algis Greenhouse, MD [facility] [address] [place], [place] [address] [phone] (office)

## 2023-02-27 NOTE — Telephone Encounter (Signed)
Surgical Date: 02/19/2023 Procedure: Xi Robotic Assisted Laparoscopic Cholecystectomy  Received call from patient [phone]~ telephone.   Patient reports that she continues to have right sided pain that travels around to right flank/ back. States that pain is not relieved by APAP/ IBU or heating pads. States that she thought pain was gas pain in nature at first, but it has not been relieved by passing gas, having BM, ambulation or Gas-X.   States that she was given Oxycodone 5 mg, #8 tabs at discharge. Reports that she has completed course. States that Oxycodone did help pain, but did not completely relieve it.   Reports that pain is sharp in nature. Reports pain interferes with her daily activities. States that she cannot stand for any period of time, even to fill cup of water, without pain worsening and her becoming "shaky".   Of note, patient also underwent EGD/ colonoscopy with Dr. Tasia Catchings on 02/17/2023.  Please advise.

## 2023-02-27 NOTE — Progress Notes (Signed)
Patient roomed to 84. Ambulatory, AOx4. Recently admitted for gallbladder removal. Stated, " I have pain after I eat anything, I am able to drink fluids with no problem". Patient screening questions completed in ED by SWOT RN. Plan of care ongoing.

## 2023-02-27 NOTE — Telephone Encounter (Signed)
Discussed case with Dr. Lovell Sheehan as Dr. Robyne Peers is on PAL.   Advised that patient should return to ED for evaluation.   Call placed to patient and patient made aware.  Verbalized understanding.

## 2023-02-27 NOTE — ED Triage Notes (Signed)
C/o right sided abd pain that radiating to right flank and back. Patient describes pain as feeling like gas but no relief with gas-x Last BM was this am but small.  tylenol and heat w/o relief.  Pt reports patient has been present since surgery on 02/19/23 for cholecystectomy.  Tylenol at 0930.

## 2023-02-28 ENCOUNTER — Observation Stay (HOSPITAL_COMMUNITY): Payer: Medicaid Other

## 2023-02-28 ENCOUNTER — Other Ambulatory Visit (HOSPITAL_COMMUNITY): Payer: Self-pay

## 2023-02-28 DIAGNOSIS — E039 Hypothyroidism, unspecified: Secondary | ICD-10-CM | POA: Diagnosis not present

## 2023-02-28 DIAGNOSIS — Z79890 Hormone replacement therapy (postmenopausal): Secondary | ICD-10-CM | POA: Diagnosis not present

## 2023-02-28 DIAGNOSIS — Z833 Family history of diabetes mellitus: Secondary | ICD-10-CM | POA: Diagnosis not present

## 2023-02-28 DIAGNOSIS — E739 Lactose intolerance, unspecified: Secondary | ICD-10-CM | POA: Diagnosis present

## 2023-02-28 DIAGNOSIS — Z90710 Acquired absence of both cervix and uterus: Secondary | ICD-10-CM | POA: Diagnosis not present

## 2023-02-28 DIAGNOSIS — R7401 Elevation of levels of liver transaminase levels: Secondary | ICD-10-CM | POA: Diagnosis not present

## 2023-02-28 DIAGNOSIS — Z820 Family history of epilepsy and other diseases of the nervous system: Secondary | ICD-10-CM | POA: Diagnosis not present

## 2023-02-28 DIAGNOSIS — E66813 Obesity, class 3: Secondary | ICD-10-CM | POA: Diagnosis present

## 2023-02-28 DIAGNOSIS — Z808 Family history of malignant neoplasm of other organs or systems: Secondary | ICD-10-CM | POA: Diagnosis not present

## 2023-02-28 DIAGNOSIS — Z818 Family history of other mental and behavioral disorders: Secondary | ICD-10-CM | POA: Diagnosis not present

## 2023-02-28 DIAGNOSIS — Z87891 Personal history of nicotine dependence: Secondary | ICD-10-CM | POA: Diagnosis not present

## 2023-02-28 DIAGNOSIS — K625 Hemorrhage of anus and rectum: Secondary | ICD-10-CM | POA: Diagnosis not present

## 2023-02-28 DIAGNOSIS — Z83438 Family history of other disorder of lipoprotein metabolism and other lipidemia: Secondary | ICD-10-CM | POA: Diagnosis not present

## 2023-02-28 DIAGNOSIS — Z8049 Family history of malignant neoplasm of other genital organs: Secondary | ICD-10-CM | POA: Diagnosis not present

## 2023-02-28 DIAGNOSIS — F17290 Nicotine dependence, other tobacco product, uncomplicated: Secondary | ICD-10-CM | POA: Diagnosis present

## 2023-02-28 DIAGNOSIS — E871 Hypo-osmolality and hyponatremia: Secondary | ICD-10-CM | POA: Diagnosis not present

## 2023-02-28 DIAGNOSIS — Z6841 Body Mass Index (BMI) 40.0 and over, adult: Secondary | ICD-10-CM | POA: Diagnosis not present

## 2023-02-28 DIAGNOSIS — M069 Rheumatoid arthritis, unspecified: Secondary | ICD-10-CM | POA: Diagnosis present

## 2023-02-28 DIAGNOSIS — R7989 Other specified abnormal findings of blood chemistry: Secondary | ICD-10-CM | POA: Insufficient documentation

## 2023-02-28 DIAGNOSIS — R1011 Right upper quadrant pain: Secondary | ICD-10-CM | POA: Diagnosis not present

## 2023-02-28 DIAGNOSIS — K8050 Calculus of bile duct without cholangitis or cholecystitis without obstruction: Secondary | ICD-10-CM | POA: Diagnosis not present

## 2023-02-28 DIAGNOSIS — K8051 Calculus of bile duct without cholangitis or cholecystitis with obstruction: Secondary | ICD-10-CM | POA: Diagnosis present

## 2023-02-28 DIAGNOSIS — Z79899 Other long term (current) drug therapy: Secondary | ICD-10-CM | POA: Diagnosis not present

## 2023-02-28 DIAGNOSIS — F909 Attention-deficit hyperactivity disorder, unspecified type: Secondary | ICD-10-CM | POA: Diagnosis present

## 2023-02-28 DIAGNOSIS — Z8249 Family history of ischemic heart disease and other diseases of the circulatory system: Secondary | ICD-10-CM | POA: Diagnosis not present

## 2023-02-28 DIAGNOSIS — G8918 Other acute postprocedural pain: Secondary | ICD-10-CM | POA: Diagnosis present

## 2023-02-28 DIAGNOSIS — I10 Essential (primary) hypertension: Secondary | ICD-10-CM | POA: Diagnosis not present

## 2023-02-28 DIAGNOSIS — E669 Obesity, unspecified: Secondary | ICD-10-CM | POA: Diagnosis not present

## 2023-02-28 DIAGNOSIS — Z9049 Acquired absence of other specified parts of digestive tract: Secondary | ICD-10-CM | POA: Diagnosis not present

## 2023-02-28 DIAGNOSIS — Z803 Family history of malignant neoplasm of breast: Secondary | ICD-10-CM | POA: Diagnosis not present

## 2023-02-28 LAB — HEPATIC FUNCTION PANEL
ALT: 481 U/L — ABNORMAL HIGH (ref 0–44)
AST: 537 U/L — ABNORMAL HIGH (ref 15–41)
Albumin: 3.6 g/dL (ref 3.5–5.0)
Alkaline Phosphatase: 245 U/L — ABNORMAL HIGH (ref 38–126)
Bilirubin, Direct: 1.4 mg/dL — ABNORMAL HIGH (ref 0.0–0.2)
Indirect Bilirubin: 1.1 mg/dL — ABNORMAL HIGH (ref 0.3–0.9)
Total Bilirubin: 2.5 mg/dL — ABNORMAL HIGH (ref 0.3–1.2)
Total Protein: 7.2 g/dL (ref 6.5–8.1)

## 2023-02-28 LAB — CBC
HCT: 39.8 % (ref 36.0–46.0)
Hemoglobin: 13 g/dL (ref 12.0–15.0)
MCH: 29.5 pg (ref 26.0–34.0)
MCHC: 32.7 g/dL (ref 30.0–36.0)
MCV: 90.2 fL (ref 80.0–100.0)
Platelets: 436 10*3/uL — ABNORMAL HIGH (ref 150–400)
RBC: 4.41 MIL/uL (ref 3.87–5.11)
RDW: 13.6 % (ref 11.5–15.5)
WBC: 4.7 10*3/uL (ref 4.0–10.5)
nRBC: 0 % (ref 0.0–0.2)

## 2023-02-28 LAB — MAGNESIUM: Magnesium: 2.1 mg/dL (ref 1.7–2.4)

## 2023-02-28 LAB — PHOSPHORUS: Phosphorus: 3.3 mg/dL (ref 2.5–4.6)

## 2023-02-28 MED ORDER — GADOBUTROL 1 MMOL/ML IV SOLN
10.0000 mL | Freq: Once | INTRAVENOUS | Status: AC | PRN
  Administered 2023-02-28: 10 mL via INTRAVENOUS

## 2023-02-28 MED ORDER — SODIUM CHLORIDE 0.9 % IV SOLN
2.0000 g | INTRAVENOUS | Status: DC
  Administered 2023-02-28: 2 g via INTRAVENOUS
  Filled 2023-02-28: qty 20

## 2023-02-28 MED ORDER — OLMESARTAN MEDOXOMIL-HCTZ 20-12.5 MG PO TABS: 1.0000 | ORAL_TABLET | Freq: Every day | ORAL | Status: DC

## 2023-02-28 MED ORDER — IRBESARTAN 150 MG PO TABS
150.0000 mg | ORAL_TABLET | Freq: Every day | ORAL | Status: DC
  Administered 2023-03-01: 150 mg via ORAL
  Filled 2023-02-28: qty 1

## 2023-02-28 MED ORDER — LEVOTHYROXINE SODIUM 100 MCG PO TABS
100.0000 ug | ORAL_TABLET | Freq: Every day | ORAL | Status: DC
  Administered 2023-03-01: 100 ug via ORAL
  Filled 2023-02-28: qty 1

## 2023-02-28 MED ORDER — LORATADINE 10 MG PO TABS
10.0000 mg | ORAL_TABLET | Freq: Every day | ORAL | Status: DC
  Administered 2023-02-28 – 2023-03-01 (×2): 10 mg via ORAL
  Filled 2023-02-28 (×2): qty 1

## 2023-02-28 MED ORDER — AMPHETAMINE-DEXTROAMPHET ER 10 MG PO CP24
20.0000 mg | ORAL_CAPSULE | Freq: Every day | ORAL | Status: DC
  Administered 2023-03-01: 20 mg via ORAL
  Filled 2023-02-28: qty 2

## 2023-02-28 MED ORDER — ALBUTEROL SULFATE (2.5 MG/3ML) 0.083% IN NEBU: 2.5000 mg | INHALATION_SOLUTION | Freq: Four times a day (QID) | RESPIRATORY_TRACT | Status: DC | PRN

## 2023-02-28 MED ORDER — LACTATED RINGERS IV SOLN: INTRAVENOUS | Status: DC

## 2023-02-28 MED ORDER — LEVOCETIRIZINE DIHYDROCHLORIDE 5 MG PO TABS: 5.0000 mg | ORAL_TABLET | Freq: Every day | ORAL | Status: DC

## 2023-02-28 MED ORDER — VITAMIN D3 25 MCG (1000 UT) PO CAPS: 1000.0000 [IU] | ORAL_CAPSULE | Freq: Every day | ORAL | Status: DC

## 2023-02-28 MED ORDER — VITAMIN D 25 MCG (1000 UNIT) PO TABS
1000.0000 [IU] | ORAL_TABLET | Freq: Every day | ORAL | Status: DC
  Administered 2023-02-28 – 2023-03-01 (×2): 1000 [IU] via ORAL
  Filled 2023-02-28 (×2): qty 1

## 2023-02-28 MED ORDER — FLUOXETINE HCL 10 MG PO CAPS
10.0000 mg | ORAL_CAPSULE | Freq: Every day | ORAL | Status: DC
  Administered 2023-03-01: 10 mg via ORAL
  Filled 2023-02-28: qty 1

## 2023-02-28 MED ORDER — HYDROCHLOROTHIAZIDE 12.5 MG PO TABS
12.5000 mg | ORAL_TABLET | Freq: Every day | ORAL | Status: DC
  Administered 2023-03-01: 12.5 mg via ORAL
  Filled 2023-02-28: qty 1

## 2023-02-28 NOTE — Progress Notes (Signed)
[facility]  Spoke with Dr. Fransisco Beau GI at [facility] regarding ERCP. They are able to accommodate and care for the patient. Patient will need to go to a feeder/ branch hospital and then come to [facility] for ERCP.   The Transfer Center will get me set up with the Hospitalist Harley [facility] for acceptance. I am unsure when the ERCP will happen to tell the patient, but it will be as soon as [facility] can accommodate.   Algis Greenhouse, MD [facility] [address] [place] [place], [place] [address] [phone] (office)

## 2023-02-28 NOTE — Discharge Summary (Signed)
Physician Discharge Summary  Patient ID: Kristin Baker MRN: [id number] DOB/AGE: [birth year] 32 y.o.  Admit date: 02/27/2023 Discharge date: 03/01/2023  Admission Diagnoses: Elevated LFTs, post operative pain  Discharge Diagnoses:  Principal Problem:   Post-operative pain Active Problems:   Elevated liver function tests   Choledocholithiasis   Discharged Condition: stable  Hospital Course: [name] is a 32 yo who was admitted 9/23-9/26 for acute cholecystitis and had a robotic assisted cholecystectomy 02/19/23 with Dr. Santina Evans Pappayliou, DO. She reports doing well for two days but over the weekend she started having worse pain and thought this was just gas but this continued to get worse. She called the office and told to go back to the ED due to her pain and feeling "shaky." She was seen in the ED and found to have elevated LFTs and was admitted for monitoring and repeat LFTs. I then obtained an MRCP after her LFTs continued to get higher.    The MRCP is positive. I have started her on antibiotics ceftriaxone to prevent cholangitis. She received a dose today 02/28/23.  MRCP images sent to [facility] via PACS.   She was accepted by [facility] for ERCP and transfer was initiated. I spoke with Dr. Fransisco Beau GI and he says that she will likely have to go to a branch/feeder of main [facility] and the be transferred to main [facility] for ERCP. I am unsure of the date of her ERCP.   The hospitalist Dr. Clint Lipps accepted the patient and the patient will go to the [facility] in [place].  She is having nausea with food but is staying hydrated so we do not have to use IVF at this time due to the shortage.   Consults: None  Significant Diagnostic Studies:  CLINICAL DATA:  Inpatient. Right abdominal pain radiating to the right flank and back. Cholecystectomy 8 days prior.   EXAM: MRI ABDOMEN WITHOUT AND WITH CONTRAST (INCLUDING MRCP)   TECHNIQUE: Multiplanar multisequence MR  imaging of the abdomen was performed both before and after the administration of intravenous contrast. Heavily T2-weighted images of the biliary and pancreatic ducts were obtained, and three-dimensional MRCP images were rendered by post processing.   CONTRAST:  10mL GADAVIST GADOBUTROL 1 MMOL/ML IV SOLN   COMPARISON:  02/27/2023 CT abdomen/pelvis   FINDINGS: Lower chest: No acute abnormality at the lung bases.   Hepatobiliary: Normal liver size and configuration. No hepatic steatosis. No liver mass. Status post cholecystectomy. No cholecystectomy bed fluid collection. Minimal central intrahepatic biliary ductal dilatation. Mildly dilated common hepatic duct with 7 mm diameter. There is a 3 mm stone in the lower third of the common bile duct on the MRCP sequence (series 17/image 38). No enhancing biliary masses, strictures or beading.   Pancreas: No pancreatic mass or duct dilation.  No pancreas divisum.   Spleen: Normal size. No mass.   Adrenals/Urinary Tract: Normal adrenals. No hydronephrosis. Simple subcentimeter posterior interpolar left renal cortical cyst. No suspicious renal masses.   Stomach/Bowel: Normal non-distended stomach. Visualized small and large bowel is normal caliber, with no bowel wall thickening.   Vascular/Lymphatic: Normal caliber abdominal aorta. Patent portal, splenic, hepatic and renal veins. Mildly prominent 1.1 cm porta hepatis (series 24/image 47) and 1.4 cm portacaval (series 24/image 52) lymph nodes. No additional pathologically enlarged lymph nodes in the abdomen.   Other: No abdominal ascites or focal fluid collection.   Musculoskeletal: No aggressive appearing focal osseous lesions.   IMPRESSION: 1. Choledocholithiasis with solitary 3  mm stone in the lower third of the common bile duct. Minimal central intrahepatic biliary ductal dilatation. Mildly dilated common hepatic duct with 7 mm diameter. 2. Mildly prominent porta hepatis and  portacaval lymph nodes, nonspecific, probably reactive given recent surgery. 3. No fluid collections.     Electronically Signed   By: Delbert Phenix M.D.   On: 02/28/2023 14:07  Latest Reference Range & Units 02/27/23 12:20 02/28/23 04:18 03/01/23 05:58  Sodium 135 - 145 mmol/L 137  137  Potassium 3.5 - 5.1 mmol/L 3.6  3.6  Chloride 98 - 111 mmol/L 103  104  CO2 22 - 32 mmol/L 25  22  Glucose 70 - 99 mg/dL 99  80  BUN 6 - 20 mg/dL <5 (L)  <5 (L)  Creatinine 0.44 - 1.00 mg/dL 1.02  7.25  Calcium 8.9 - 10.3 mg/dL 9.0  8.4 (L)  Anion gap 5 - 15  9  11   Phosphorus 2.5 - 4.6 mg/dL  3.3   Magnesium 1.7 - 2.4 mg/dL  2.1   Alkaline Phosphatase 38 - 126 U/L 209 (H) 245 (H) 290 (H)  Albumin 3.5 - 5.0 g/dL 3.7 3.6 3.4 (L)  Lipase 11 - 51 U/L 22    AST 15 - 41 U/L 252 (H) 537 (H) 237 (H)  ALT 0 - 44 U/L 123 (H) 481 (H) 824 (H)  Total Protein 6.5 - 8.1 g/dL 7.5 7.2 6.9  Bilirubin, Direct 0.0 - 0.2 mg/dL  1.4 (H)   Indirect Bilirubin 0.3 - 0.9 mg/dL  1.1 (H)   Total Bilirubin 0.3 - 1.2 mg/dL 0.8 2.5 (H) 2.2 (H)  (L): Data is abnormally low (H): Data is abnormally high Treatments: IV hydration and antibiotics: ceftriaxone for cholangitis prevention  Discharge Exam: Blood pressure 101/68, pulse 69, temperature 97.9 F (36.6 C), temperature source Oral, resp. rate 20, weight 128 kg, last menstrual period 03/10/2022, SpO2 98%. General appearance: alert and no distress GI: soft, tender epigastric/ RUQ, port sites with glue, no erythema or drainage  Disposition:  Discharge disposition: 51-Hospice/Medical Facility        Allergies as of 03/01/2023       Reactions   Poison Ivy Extract Anaphylaxis   Poison Oak Extract Anaphylaxis   Anser Anser Feather (goose) Allergy Skin Test    Flexeril [cyclobenzaprine] Hives   Lactose Intolerance (gi) Diarrhea   Honeysuckle Flower [lonicera] Rash   Mixed Grasses Rash        Medication List     TAKE these medications    acetaminophen 500  MG tablet Commonly known as: TYLENOL Take 2 tablets (1,000 mg total) by mouth every 6 (six) hours.   albuterol 108 (90 Base) MCG/ACT inhaler Commonly known as: Ventolin HFA Inhale 2 puffs into the lungs every 6 (six) hours as needed for wheezing or shortness of breath.   amphetamine-dextroamphetamine 5 MG tablet Commonly known as: ADDERALL Take 5 mg by mouth daily in the afternoon.   amphetamine-dextroamphetamine 20 MG 24 hr capsule Commonly known as: ADDERALL XR Take 20 mg by mouth daily.   docusate sodium 100 MG capsule Commonly known as: Colace Take 1 capsule (100 mg total) by mouth 2 (two) times daily.   Emgality 120 MG/ML Soaj Generic drug: Galcanezumab-gnlm Inject 1 pen  into the skin every 30 (thirty) days.   FLUoxetine 10 MG capsule Commonly known as: PROZAC Take 10 mg by mouth daily.   levocetirizine 5 MG tablet Commonly known as: XYZAL Take 1 tablet (5 mg total) by  mouth daily.   levothyroxine 100 MCG tablet Commonly known as: SYNTHROID Take 1 tablet (100 mcg total) by mouth daily.   olmesartan-hydrochlorothiazide 20-12.5 MG tablet Commonly known as: BENICAR HCT TAKE 1 TABLET BY MOUTH DAILY   omeprazole 40 MG capsule Commonly known as: PRILOSEC Take 1 capsule (40 mg total) by mouth daily.   ondansetron 4 MG tablet Commonly known as: Zofran Take 1 tablet (4 mg total) by mouth daily as needed for nausea or vomiting.   oxyCODONE 5 MG immediate release tablet Commonly known as: Roxicodone Take 1 tablet (5 mg total) by mouth every 6 (six) hours as needed.   psyllium 58.6 % packet Commonly known as: METAMUCIL Take 1 packet by mouth 2 (two) times daily.   sucralfate 1 g tablet Commonly known as: Carafate Take 1 tablet (1 g total) by mouth 4 (four) times daily -  with meals and at bedtime.   tiZANidine 4 MG tablet Commonly known as: ZANAFLEX TAKE 1 TABLET(4 MG) BY MOUTH AT BEDTIME AS NEEDED FOR MUSCLE SPASMS   Vitamin D3 25 MCG (1000 UT) Caps Take 1  capsule (1,000 Units total) by mouth daily.         Signed: Lucretia Roers 03/01/2023, 10:06 AM

## 2023-02-28 NOTE — H&P (Signed)
[facility] History and Physical  Reason for Referral: Post operative pain and elevated liver test  Referring Physician: Dr. Particia Nearing   Chief Complaint   Abdominal Pain     Annalycia Done is a 32 y.o. female.  HPI: Ms. Holtrop is a 32 yo who comes in post operative pain in the RUQ especially after eating. She had a robotic assisted laparoscopic cholecystectomy 9/25 with Dr. Robyne Peers and says for a few days she was feeling ok but then over the weekend she started to have pain with eating. She thought at first this was gas pain. She has been taking tylenol. She called the office and they recommend going to the ED. In the ED her LFTs were elevated and CT was reassuring. She was admitted overnight for pain control and monitoring of her LFTs.  Her LFTs have risen again today including the bilirubin.   Past Medical History:  Diagnosis Date   Abdominal pain    ADHD    ANA positive    Back pain    DDD (degenerative disc disease), lumbar    Eye pain, right    Fatigue    Frontal sinusitis    Hypertension    Hypothyroidism    Iron deficiency anemia due to chronic blood loss 06/19/2022   Medical history non-contributory    Obesity, morbid (HCC)    Palpitations    Rheumatoid arthritis [facility])     Past Surgical History:  Procedure Laterality Date   ABLATION     05/22/2020 left side, 06/14/2020 right side    BIOPSY  02/17/2023   Procedure: BIOPSY;  Surgeon: Franky Macho, MD;  [facility];  Service: Endoscopy;;   COLONOSCOPY WITH PROPOFOL N/A 02/17/2023   Procedure: COLONOSCOPY WITH PROPOFOL;  Surgeon: Franky Macho, MD;  [facility];  Service: Endoscopy;  Laterality: N/A;  9:45am;asa 2   ESOPHAGOGASTRODUODENOSCOPY (EGD) WITH PROPOFOL N/A 02/17/2023   Procedure: ESOPHAGOGASTRODUODENOSCOPY (EGD) WITH PROPOFOL;  Surgeon: Franky Macho, MD;  [facility];  Service: Endoscopy;  Laterality: N/A;  9:45am;asa 2   HYSTERECTOMY  ABDOMINAL WITH SALPINGECTOMY     POLYPECTOMY  02/17/2023   Procedure: POLYPECTOMY;  Surgeon: Franky Macho, MD;  [facility];  Service: Endoscopy;;   ROBOTIC ASSISTED TOTAL HYSTERECTOMY Bilateral 07/30/2022   Procedure: XI ROBOTIC ASSISTED TOTAL HYSTERECTOMY AND BILATERAL SALPINGECTOMY;  Surgeon: Lazaro Arms, MD;  Location: [facility];  Service: Gynecology;  Laterality: Bilateral;    Family History  Problem Relation Age of Onset   Cervical cancer Maternal Grandmother    Breast cancer Maternal Grandmother    Skin cancer Maternal Grandmother    Heart attack Maternal Grandfather    Diabetes Father    Heart attack Father    Hyperlipidemia Mother    Peripheral Artery Disease Mother    ADD / ADHD Mother    Bipolar disorder Mother    Cervical cancer Mother        at age 49.   ADD / ADHD Sister    Healthy Daughter    Breast cancer Maternal Aunt    Cervical cancer Maternal Aunt    ADD / ADHD Maternal Aunt    Uterine cancer Maternal Aunt    Breast cancer Maternal Aunt    Multiple sclerosis Paternal Aunt    Colon cancer Neg Hx     Social History   Tobacco Use   Smoking status: Every Day    Current packs/day: 0.00  Average packs/day: 1 pack/day for 15.0 years (15.0 ttl pk-yrs)    Types: Cigarettes, E-cigarettes    Start date: 11/24/2005    Last attempt to quit: 11/24/2020    Years since quitting: 2.2    Passive exposure: Current   Smokeless tobacco: Never   Tobacco comments:    She has quit of and on.  Vaping Use   Vaping status: Every Day  Substance Use Topics   Alcohol use: Yes    Comment: occasionally /social events   Drug use: Not Currently    Comment: smoked weed during teen years    Medications: I have reviewed the patient's current medications. Prior to Admission:  Medications Prior to Admission  Medication Sig Dispense Refill Last Dose   acetaminophen (TYLENOL) 500 MG tablet Take 2 tablets (1,000 mg total) by mouth every 6 (six) hours. 30 tablet 0  02/27/2023 at 0930   albuterol (VENTOLIN HFA) 108 (90 Base) MCG/ACT inhaler Inhale 2 puffs into the lungs every 6 (six) hours as needed for wheezing or shortness of breath. 18 g 1 Past Month   amphetamine-dextroamphetamine (ADDERALL XR) 20 MG 24 hr capsule Take 20 mg by mouth daily.   02/27/2023   amphetamine-dextroamphetamine (ADDERALL) 5 MG tablet Take 5 mg by mouth daily in the afternoon.   02/26/2023   Cholecalciferol (VITAMIN D3) 25 MCG (1000 UT) CAPS Take 1 capsule (1,000 Units total) by mouth daily. 30 capsule 3 02/27/2023   docusate sodium (COLACE) 100 MG capsule Take 1 capsule (100 mg total) by mouth 2 (two) times daily. 60 capsule 2 02/23/2023   FLUoxetine (PROZAC) 10 MG capsule Take 10 mg by mouth daily.   02/27/2023   Galcanezumab-gnlm (EMGALITY) 120 MG/ML SOAJ Inject 1 pen  into the skin every 30 (thirty) days. 3 mL 3 02/21/2023   levocetirizine (XYZAL) 5 MG tablet Take 1 tablet (5 mg total) by mouth daily. 30 tablet 2 02/27/2023   levothyroxine (SYNTHROID) 100 MCG tablet Take 1 tablet (100 mcg total) by mouth daily. 90 tablet 1 02/27/2023   olmesartan-hydrochlorothiazide (BENICAR HCT) 20-12.5 MG tablet TAKE 1 TABLET BY MOUTH DAILY 90 tablet 0 02/27/2023   omeprazole (PRILOSEC) 40 MG capsule Take 1 capsule (40 mg total) by mouth daily. 30 capsule 0 02/27/2023   ondansetron (ZOFRAN) 4 MG tablet Take 1 tablet (4 mg total) by mouth daily as needed for nausea or vomiting. 30 tablet 1 02/26/2023   psyllium (METAMUCIL) 58.6 % packet Take 1 packet by mouth 2 (two) times daily. 60 packet 2 02/27/2023   sucralfate (CARAFATE) 1 g tablet Take 1 tablet (1 g total) by mouth 4 (four) times daily -  with meals and at bedtime. 90 tablet 0 02/26/2023   tiZANidine (ZANAFLEX) 4 MG tablet TAKE 1 TABLET(4 MG) BY MOUTH AT BEDTIME AS NEEDED FOR MUSCLE SPASMS 30 tablet 0 02/26/2023   oxyCODONE (ROXICODONE) 5 MG immediate release tablet Take 1 tablet (5 mg total) by mouth every 6 (six) hours as needed. 8 tablet 0 02/23/2023    Scheduled:  docusate sodium  100 mg Oral BID   pantoprazole (PROTONIX) IV  40 mg Intravenous QHS   Continuous:  methocarbamol (ROBAXIN) IV     OZH:[id number] **OR** diphenhydrAMINE, ibuprofen, methocarbamol **OR** methocarbamol (ROBAXIN) IV, metoprolol tartrate, morphine injection, ondansetron **OR** ondansetron (ZOFRAN) IV, oxyCODONE, simethicone, zolpidem Allergies  Allergen Reactions   Poison Ivy Extract Anaphylaxis   Poison Oak Extract Anaphylaxis   Anser Pharmacist, community (Goose) Allergy Skin Test    Flexeril [Cyclobenzaprine] Hives  Lactose Intolerance (Gi) Diarrhea   Honeysuckle Flower [Lonicera] Rash   Mixed Grasses Rash      ROS:  A comprehensive review of systems was negative except for: Gastrointestinal: positive for abdominal pain and nausea  Blood pressure (!) 101/57, pulse (!) 57, temperature 98 F (36.7 C), resp. rate 16, weight 128 kg, last menstrual period 03/10/2022, SpO2 94%. Physical Exam Vitals reviewed.  HENT:     Head: Normocephalic.  Eyes:     Extraocular Movements: Extraocular movements intact.  Cardiovascular:     Rate and Rhythm: Normal rate.  Pulmonary:     Effort: Pulmonary effort is normal.  Abdominal:     General: There is no distension.     Palpations: Abdomen is soft.     Tenderness: There is abdominal tenderness in the right upper quadrant.  Musculoskeletal:     Comments: Moved all extremities   Skin:    General: Skin is warm.  Neurological:     General: No focal deficit present.     Mental Status: She is alert and oriented to person, place, and time.  Psychiatric:        Mood and Affect: Mood normal.        Behavior: Behavior normal.     Results: Results for orders placed or performed during the hospital encounter of 02/27/23 (from the past 48 hour(s))  Lipase, blood     Status: None   Collection Time: 02/27/23 12:20 PM  Result Value Ref Range   Lipase 22 11 - 51 U/L    Comment: Performed at [facility], [address], [place], [place] [address]  Comprehensive metabolic panel     Status: Abnormal   Collection Time: 02/27/23 12:20 PM  Result Value Ref Range   Sodium 137 135 - 145 mmol/L   Potassium 3.6 3.5 - 5.1 mmol/L   Chloride 103 98 - 111 mmol/L   CO2 25 22 - 32 mmol/L   Glucose, Bld 99 70 - 99 mg/dL    Comment: Glucose reference range applies only to samples taken after fasting for at least 8 hours.   BUN <5 (L) 6 - 20 mg/dL   Creatinine, Ser 6.04 0.44 - 1.00 mg/dL   Calcium 9.0 8.9 - 54.0 mg/dL   Total Protein 7.5 6.5 - 8.1 g/dL   Albumin 3.7 3.5 - 5.0 g/dL   AST 981 (H) 15 - 41 U/L   ALT 123 (H) 0 - 44 U/L   Alkaline Phosphatase 209 (H) 38 - 126 U/L   Total Bilirubin 0.8 0.3 - 1.2 mg/dL   GFR, Estimated >19 >14 mL/min    Comment: (NOTE) Calculated using the CKD-EPI Creatinine Equation (2021)    Anion gap 9 5 - 15    Comment: Performed at [facility], [address] [place], [place] [address]  CBC     Status: Abnormal   Collection Time: 02/27/23 12:20 PM  Result Value Ref Range   WBC 13.5 (H) 4.0 - 10.5 K/uL   RBC 4.34 3.87 - 5.11 MIL/uL   Hemoglobin 13.2 12.0 - 15.0 g/dL   HCT 62.1 30.8 - 65.7 %   MCV 89.2 80.0 - 100.0 fL   MCH 30.4 26.0 - 34.0 pg   MCHC 34.1 30.0 - 36.0 g/dL   RDW 84.6 96.2 - 95.2 %   Platelets 459 (H) 150 - 400 K/uL   nRBC 0.0 0.0 - 0.2 %    Comment: Performed at [facility], [address], [place], [place]  [address]  Acetaminophen level     Status: None   Collection Time: 02/27/23 12:20 PM  Result Value Ref Range   Acetaminophen (Tylenol), Serum 17 10 - 30 ug/mL    Comment: (NOTE) Therapeutic concentrations vary significantly. A range of 10-30 ug/mL  may be an effective concentration for many patients. However, some  are best treated at concentrations outside of this range. Acetaminophen concentrations >150 ug/mL at 4 hours after ingestion  and >50 ug/mL at 12 hours after ingestion are often associated with  toxic reactions.  Performed at [facility], [address], [place], [place] [address]   Urinalysis, Routine w reflex microscopic -Urine, Clean Catch     Status: Abnormal   Collection Time: 02/27/23  6:39 PM  Result Value Ref Range   Color, Urine STRAW (A) YELLOW   APPearance CLEAR CLEAR   Specific Gravity, Urine 1.017 1.005 - 1.030   pH 7.0 5.0 - 8.0   Glucose, UA NEGATIVE NEGATIVE mg/dL   Hgb urine dipstick NEGATIVE NEGATIVE   Bilirubin Urine NEGATIVE NEGATIVE   Ketones, ur NEGATIVE NEGATIVE mg/dL   Protein, ur NEGATIVE NEGATIVE mg/dL   Nitrite NEGATIVE NEGATIVE   Leukocytes,Ua NEGATIVE NEGATIVE    Comment: Performed at [facility], [address], [place], [place] [address]  Hepatic function panel     Status: Abnormal   Collection Time: 02/28/23  4:18 AM  Result Value Ref Range   Total Protein 7.2 6.5 - 8.1 g/dL   Albumin 3.6 3.5 - 5.0 g/dL   AST 956 (H) 15 - 41 U/L   ALT 481 (H) 0 - 44 U/L   Alkaline Phosphatase 245 (H) 38 - 126 U/L   Total Bilirubin 2.5 (H) 0.3 - 1.2 mg/dL   Bilirubin, Direct 1.4 (H) 0.0 - 0.2 mg/dL   Indirect Bilirubin 1.1 (H) 0.3 - 0.9 mg/dL    Comment: Performed at [facility], [address], [place], [place] [address]  Magnesium     Status: None   Collection Time: 02/28/23  4:18 AM  Result Value Ref Range   Magnesium 2.1 1.7 - 2.4 mg/dL    Comment: Performed at [facility], [address], [place], [place] [address]  Phosphorus     Status: None   Collection Time: 02/28/23  4:18 AM  Result Value Ref Range   Phosphorus 3.3 2.5 - 4.6 mg/dL    Comment: Performed at [facility], [address], [place], [place] [address]  CBC     Status: Abnormal   Collection Time: 02/28/23  4:18 AM  Result Value Ref Range   WBC 4.7 4.0 - 10.5 K/uL   RBC 4.41 3.87 - 5.11 MIL/uL   Hemoglobin 13.0 12.0 - 15.0 g/dL   HCT 52.8 41.3 - 24.4 %   MCV 90.2 80.0 - 100.0 fL   MCH 29.5 26.0 - 34.0 pg   MCHC 32.7 30.0 - 36.0 g/dL   RDW 01.0 27.2 - 53.6 %   Platelets 436 (H) 150 - 400 K/uL   nRBC 0.0 0.0 - 0.2 %     Comment: Performed at [facility], [address], [place], [place] [address]   Personally reviewed and showed patient- no fluid in fossa  CT ABDOMEN PELVIS W CONTRAST  Result Date: 02/27/2023 CLINICAL DATA:  Right-sided abdominal pain radiating to the right flank and back. Recent cholecystectomy eight days ago. EXAM: CT ABDOMEN AND PELVIS WITH CONTRAST TECHNIQUE: Multidetector CT imaging of the abdomen and pelvis was performed using the standard  protocol following bolus administration of intravenous contrast. RADIATION DOSE REDUCTION: This exam was performed according to the departmental dose-optimization program which includes automated exposure control, adjustment of the mA and/or kV according to patient size and/or use of iterative reconstruction technique. CONTRAST:  OMNIPAQUE IOHEXOL 300 MG/ML  SOLN COMPARISON:  CT abdomen pelvis dated February 18, 2023. FINDINGS: Lower chest: No acute abnormality. Hepatobiliary: No focal liver abnormality is seen. Status post cholecystectomy with new surgical clips and mild stranding in the gallbladder fossa. No fluid collection. No biliary dilatation. Pancreas: Unremarkable. No pancreatic ductal dilatation or surrounding inflammatory changes. Spleen: Normal in size without focal abnormality. Adrenals/Urinary Tract: Adrenal glands are unremarkable. Kidneys are normal, without renal calculi, focal lesion, or hydronephrosis. Bladder is unremarkable. Stomach/Bowel: Stomach is within normal limits. Appendix appears normal. No evidence of bowel wall thickening, distention, or inflammatory changes. Vascular/Lymphatic: No significant vascular findings are present. No enlarged abdominal or pelvic lymph nodes. Reproductive: Status post hysterectomy. 2.7 cm right ovarian cyst. No follow-up imaging is recommended. Normal left ovary. Other: Trace free fluid in the pelvis.  No pneumoperitoneum. Musculoskeletal: No acute or significant osseous findings. IMPRESSION: 1. Status  post cholecystectomy without evident complication. No acute intra-abdominal process. Electronically Signed   By: Obie Dredge M.D.   On: 02/27/2023 16:46     Assessment & Plan:  Janelis Stelzer is a 32 y.o. female with elevated LFTs and post operative pain s/p robotic laparoscopic cholecystectomy. She has worsening LFTs. Due to this we discussed that she could have choledocholithiasis. I have ordered an MRCP. Discussed if MRCP is negative may have to get a HIDA to look for a leak but less likely since no fluid in the fossa.   NPO MRCP  All questions were answered to the satisfaction of the patient.    Lucretia Roers 02/28/2023, 9:51 AM

## 2023-02-28 NOTE — Progress Notes (Signed)
Report called to [facility] location, spoke with [facility]. Patient assigned to room 313.

## 2023-02-28 NOTE — Progress Notes (Addendum)
[facility]  Choledocholithiasis on MRCP s/p robotic cholecystectomy.   [facility], Dr. Barron Alvine cannot get patient in for ERCP this weekend and unsure of next weeks options either as they are "day to day" with ERCPs. Will need to get patient transferred. Have discussed with the patient and her family and showed them MRCP images. Updated Dr. Robyne Peers.  Will try [facility] first.  Have started antibiotics on patient due to risk of cholangitis with choledocholithiasis . Diet for now until we get her sorted out.    Kristin Greenhouse, MD [facility] [address], [place] [address] [phone] (office)

## 2023-02-28 NOTE — Progress Notes (Signed)
[facility]  [facility] Transfer to see where we stand, MRCP had been sent. [facility] is still looking at the request and has not made a final decision.   RN asked patient if she wanted me to call [facility] or [facility] yet, and patient prefers [facility], so will continue with this plan for now.   Algis Greenhouse, MD [facility] [address] [place], [place] [address] [phone] (office)

## 2023-03-01 DIAGNOSIS — E669 Obesity, unspecified: Secondary | ICD-10-CM | POA: Diagnosis not present

## 2023-03-01 DIAGNOSIS — K8050 Calculus of bile duct without cholangitis or cholecystitis without obstruction: Secondary | ICD-10-CM | POA: Diagnosis not present

## 2023-03-01 DIAGNOSIS — I10 Essential (primary) hypertension: Secondary | ICD-10-CM | POA: Diagnosis not present

## 2023-03-01 DIAGNOSIS — E871 Hypo-osmolality and hyponatremia: Secondary | ICD-10-CM | POA: Diagnosis not present

## 2023-03-01 DIAGNOSIS — Z6841 Body Mass Index (BMI) 40.0 and over, adult: Secondary | ICD-10-CM | POA: Diagnosis not present

## 2023-03-01 DIAGNOSIS — Z79890 Hormone replacement therapy (postmenopausal): Secondary | ICD-10-CM | POA: Diagnosis not present

## 2023-03-01 DIAGNOSIS — Z87891 Personal history of nicotine dependence: Secondary | ICD-10-CM | POA: Diagnosis not present

## 2023-03-01 DIAGNOSIS — Z79899 Other long term (current) drug therapy: Secondary | ICD-10-CM | POA: Diagnosis not present

## 2023-03-01 DIAGNOSIS — Z9049 Acquired absence of other specified parts of digestive tract: Secondary | ICD-10-CM | POA: Diagnosis not present

## 2023-03-01 DIAGNOSIS — E039 Hypothyroidism, unspecified: Secondary | ICD-10-CM | POA: Diagnosis not present

## 2023-03-01 DIAGNOSIS — K625 Hemorrhage of anus and rectum: Secondary | ICD-10-CM | POA: Diagnosis not present

## 2023-03-01 LAB — COMPREHENSIVE METABOLIC PANEL
ALT: 824 U/L — ABNORMAL HIGH (ref 0–44)
AST: 237 U/L — ABNORMAL HIGH (ref 15–41)
Albumin: 3.4 g/dL — ABNORMAL LOW (ref 3.5–5.0)
Alkaline Phosphatase: 290 U/L — ABNORMAL HIGH (ref 38–126)
Anion gap: 11 (ref 5–15)
BUN: <5 mg/dL — ABNORMAL LOW (ref 6–20)
CO2: 22 mmol/L (ref 22–32)
Calcium: 8.4 mg/dL — ABNORMAL LOW (ref 8.9–10.3)
Chloride: 104 mmol/L (ref 98–111)
Creatinine, Ser: 0.67 mg/dL (ref 0.44–1.00)
GFR, Estimated: >60 mL/min (ref >60)
Glucose, Bld: 80 mg/dL (ref 70–99)
Potassium: 3.6 mmol/L (ref 3.5–5.1)
Sodium: 137 mmol/L (ref 135–145)
Total Bilirubin: 2.2 mg/dL — ABNORMAL HIGH (ref 0.3–1.2)
Total Protein: 6.9 g/dL (ref 6.5–8.1)

## 2023-03-01 MED ORDER — MORPHINE SULFATE (PF) 2 MG/ML IV SOLN
2.0000 mg | Freq: Once | INTRAVENOUS | Status: AC
  Administered 2023-03-01: 2 mg via INTRAVENOUS
  Filled 2023-03-01: qty 1

## 2023-03-01 NOTE — Progress Notes (Signed)
Patient transported to [facility] by their transport team, report given.  Emry Tobin, Kae Heller, RN

## 2023-03-01 NOTE — Progress Notes (Signed)
Patient is resting in her bed at this time.Currently still waiting on transport to [facility]. Prn medication given 3 times during this shift. Plan of care ongoing.

## 2023-03-01 NOTE — Plan of Care (Signed)

## 2023-03-01 NOTE — Progress Notes (Signed)
Transport arranged with  [facility]'s line. [facility] unable to transport patient at this time.

## 2023-03-01 NOTE — Progress Notes (Signed)
[facility]  Updated dc summary and spoke with patient. RUQ pain, taking in fluids but not really wanting food. LFTs continue to be elevated.  ERCP at [facility] after transfer. RN to see if [facility] can take her if [facility] is not available.  Algis Greenhouse, MD [facility] [address] [place], [place] [address] [phone] (office)

## 2023-03-01 NOTE — Progress Notes (Signed)
Spoke with Lauren with [facility] transport, provided update on patient status.  ETA to be provided approximately by 0830 am.

## 2023-03-02 DIAGNOSIS — Z9049 Acquired absence of other specified parts of digestive tract: Secondary | ICD-10-CM | POA: Diagnosis not present

## 2023-03-02 DIAGNOSIS — R1011 Right upper quadrant pain: Secondary | ICD-10-CM | POA: Diagnosis not present

## 2023-03-02 DIAGNOSIS — K8050 Calculus of bile duct without cholangitis or cholecystitis without obstruction: Secondary | ICD-10-CM | POA: Diagnosis not present

## 2023-03-03 DIAGNOSIS — K8050 Calculus of bile duct without cholangitis or cholecystitis without obstruction: Secondary | ICD-10-CM | POA: Diagnosis not present

## 2023-03-03 DIAGNOSIS — Z9049 Acquired absence of other specified parts of digestive tract: Secondary | ICD-10-CM | POA: Diagnosis not present

## 2023-03-03 DIAGNOSIS — K838 Other specified diseases of biliary tract: Secondary | ICD-10-CM | POA: Diagnosis not present

## 2023-03-04 ENCOUNTER — Encounter: Payer: Medicaid Other | Admitting: Surgery

## 2023-03-04 DIAGNOSIS — K8050 Calculus of bile duct without cholangitis or cholecystitis without obstruction: Secondary | ICD-10-CM | POA: Diagnosis not present

## 2023-03-10 DIAGNOSIS — R195 Other fecal abnormalities: Secondary | ICD-10-CM | POA: Diagnosis not present

## 2023-03-10 DIAGNOSIS — K5904 Chronic idiopathic constipation: Secondary | ICD-10-CM | POA: Diagnosis not present

## 2023-03-10 DIAGNOSIS — R10826 Epigastric rebound abdominal tenderness: Secondary | ICD-10-CM | POA: Diagnosis not present

## 2023-03-10 DIAGNOSIS — R1114 Bilious vomiting: Secondary | ICD-10-CM | POA: Diagnosis not present

## 2023-03-10 DIAGNOSIS — E038 Other specified hypothyroidism: Secondary | ICD-10-CM | POA: Diagnosis not present

## 2023-03-10 DIAGNOSIS — D500 Iron deficiency anemia secondary to blood loss (chronic): Secondary | ICD-10-CM | POA: Diagnosis not present

## 2023-03-10 DIAGNOSIS — Z6841 Body Mass Index (BMI) 40.0 and over, adult: Secondary | ICD-10-CM | POA: Diagnosis not present

## 2023-03-10 DIAGNOSIS — K625 Hemorrhage of anus and rectum: Secondary | ICD-10-CM | POA: Diagnosis not present

## 2023-03-10 DIAGNOSIS — R112 Nausea with vomiting, unspecified: Secondary | ICD-10-CM | POA: Diagnosis not present

## 2023-03-11 DIAGNOSIS — F439 Reaction to severe stress, unspecified: Secondary | ICD-10-CM | POA: Diagnosis not present

## 2023-03-11 DIAGNOSIS — F329 Major depressive disorder, single episode, unspecified: Secondary | ICD-10-CM | POA: Diagnosis not present

## 2023-03-11 DIAGNOSIS — F988 Other specified behavioral and emotional disorders with onset usually occurring in childhood and adolescence: Secondary | ICD-10-CM | POA: Diagnosis not present

## 2023-03-11 LAB — CBC
Hematocrit: 43.9 % (ref 34.0–46.6)
Hemoglobin: 14.1 g/dL (ref 11.1–15.9)
MCH: 30 pg (ref 26.6–33.0)
MCHC: 32.1 g/dL (ref 31.5–35.7)
MCV: 93 fL (ref 79–97)
Platelets: 409 10*3/uL (ref 150–450)
RBC: 4.7 x10E6/uL (ref 3.77–5.28)
RDW: 13.3 % (ref 11.7–15.4)
WBC: 6 10*3/uL (ref 3.4–10.8)

## 2023-03-11 LAB — BMP8+EGFR
BUN/Creatinine Ratio: 7 — ABNORMAL LOW (ref 9–23)
BUN: 5 mg/dL — ABNORMAL LOW (ref 6–20)
CO2: 22 mmol/L (ref 20–29)
Calcium: 9.3 mg/dL (ref 8.7–10.2)
Chloride: 101 mmol/L (ref 96–106)
Creatinine, Ser: 0.72 mg/dL (ref 0.57–1.00)
Glucose: 93 mg/dL (ref 70–99)
Potassium: 3.9 mmol/L (ref 3.5–5.2)
Sodium: 139 mmol/L (ref 134–144)
eGFR: 113 mL/min/{1.73_m2} (ref >59)

## 2023-03-11 LAB — TSH+FREE T4
Free T4: 0.94 ng/dL (ref 0.82–1.77)
TSH: 6.47 u[IU]/mL — ABNORMAL HIGH (ref 0.450–4.500)

## 2023-03-11 LAB — IGA: IgA/Immunoglobulin A, Serum: 295 mg/dL (ref 87–352)

## 2023-03-14 ENCOUNTER — Other Ambulatory Visit: Payer: Self-pay | Admitting: *Deleted

## 2023-03-14 ENCOUNTER — Telehealth: Payer: Self-pay | Admitting: *Deleted

## 2023-03-14 DIAGNOSIS — R7989 Other specified abnormal findings of blood chemistry: Secondary | ICD-10-CM | POA: Diagnosis not present

## 2023-03-14 NOTE — Telephone Encounter (Signed)
Received call from patient [phone]~ telephone.   Patient requested orders for labs to be placed per discharge from [facility] for elevated lever function and hyponatremia.   CMP orders placed.

## 2023-03-15 LAB — COMPREHENSIVE METABOLIC PANEL
AG Ratio: 1.3 (calc) (ref 1.0–2.5)
ALT: 55 U/L — ABNORMAL HIGH (ref 6–29)
AST: 24 U/L (ref 10–30)
Albumin: 4.5 g/dL (ref 3.6–5.1)
Alkaline phosphatase (APISO): 143 U/L — ABNORMAL HIGH (ref 31–125)
BUN: 10 mg/dL (ref 7–25)
CO2: 24 mmol/L (ref 20–32)
Calcium: 9.8 mg/dL (ref 8.6–10.2)
Chloride: 99 mmol/L (ref 98–110)
Creat: 0.73 mg/dL (ref 0.50–0.97)
Globulin: 3.5 g/dL (ref 1.9–3.7)
Glucose, Bld: 108 mg/dL — ABNORMAL HIGH (ref 65–99)
Potassium: 4 mmol/L (ref 3.5–5.3)
Sodium: 135 mmol/L (ref 135–146)
Total Bilirubin: 0.6 mg/dL (ref 0.2–1.2)
Total Protein: 8 g/dL (ref 6.1–8.1)

## 2023-03-18 ENCOUNTER — Ambulatory Visit (INDEPENDENT_AMBULATORY_CARE_PROVIDER_SITE_OTHER): Payer: Medicaid Other | Admitting: Family Medicine

## 2023-03-18 ENCOUNTER — Encounter: Payer: Self-pay | Admitting: Surgery

## 2023-03-18 ENCOUNTER — Ambulatory Visit (INDEPENDENT_AMBULATORY_CARE_PROVIDER_SITE_OTHER): Payer: Medicaid Other | Admitting: Surgery

## 2023-03-18 ENCOUNTER — Encounter: Payer: Self-pay | Admitting: Family Medicine

## 2023-03-18 VITALS — BP 124/80 | HR 91 | Ht 67.0 in | Wt 276.1 lb

## 2023-03-18 VITALS — BP 124/78 | HR 62 | Temp 98.2°F | Resp 14 | Ht 67.0 in | Wt 296.0 lb

## 2023-03-18 DIAGNOSIS — K8050 Calculus of bile duct without cholangitis or cholecystitis without obstruction: Secondary | ICD-10-CM

## 2023-03-18 DIAGNOSIS — G8929 Other chronic pain: Secondary | ICD-10-CM | POA: Diagnosis not present

## 2023-03-18 DIAGNOSIS — J302 Other seasonal allergic rhinitis: Secondary | ICD-10-CM | POA: Diagnosis not present

## 2023-03-18 DIAGNOSIS — F988 Other specified behavioral and emotional disorders with onset usually occurring in childhood and adolescence: Secondary | ICD-10-CM | POA: Diagnosis not present

## 2023-03-18 DIAGNOSIS — M545 Low back pain, unspecified: Secondary | ICD-10-CM

## 2023-03-18 DIAGNOSIS — E038 Other specified hypothyroidism: Secondary | ICD-10-CM

## 2023-03-18 DIAGNOSIS — J3089 Other allergic rhinitis: Secondary | ICD-10-CM | POA: Diagnosis not present

## 2023-03-18 DIAGNOSIS — F329 Major depressive disorder, single episode, unspecified: Secondary | ICD-10-CM | POA: Diagnosis not present

## 2023-03-18 DIAGNOSIS — Z09 Encounter for follow-up examination after completed treatment for conditions other than malignant neoplasm: Secondary | ICD-10-CM

## 2023-03-18 DIAGNOSIS — F439 Reaction to severe stress, unspecified: Secondary | ICD-10-CM | POA: Diagnosis not present

## 2023-03-18 MED ORDER — LEVOCETIRIZINE DIHYDROCHLORIDE 5 MG PO TABS: 5.0000 mg | ORAL_TABLET | Freq: Every day | ORAL | 2 refills | Status: DC

## 2023-03-18 MED ORDER — TIZANIDINE HCL 4 MG PO TABS
4.0000 mg | ORAL_TABLET | Freq: Every day | ORAL | 1 refills | Status: DC
Start: 2023-03-18 — End: 2023-08-29

## 2023-03-18 MED ORDER — LEVOTHYROXINE SODIUM 112 MCG PO TABS
112.0000 ug | ORAL_TABLET | Freq: Every day | ORAL | 1 refills | Status: DC
Start: 2023-03-18 — End: 2023-05-02

## 2023-03-18 NOTE — Patient Instructions (Addendum)
I appreciate the opportunity to provide care to you today!    Attached with your AVS, you will find valuable resources for self-education. I highly recommend dedicating some time to thoroughly examine them.   Please continue to a heart-healthy diet and increase your physical activities. Try to exercise for at least five days a week.    It was a pleasure to see you and I look forward to continuing to work together on your health and well-being. Please do not hesitate to call the office if you need care or have questions about your care.  In case of emergency, please visit the Emergency Department for urgent care, or contact our clinic at [phone] to schedule an appointment. We're here to help you!   Have a wonderful day and week. With Gratitude, Gilmore Laroche MSN, FNP-BC

## 2023-03-18 NOTE — Progress Notes (Unsigned)
[facility] Note   HPI:  32 y.o. Female presents to clinic for post-op follow-up status post robotic assisted laparoscopic cholecystectomy on 9/25.  Patient was initially doing well postoperatively, but she Leander Rams presented to the hospital about a week postop for increasing pain.  At that time she was noted to have choledocholithiasis.  She was [facility] and underwent ERCP with GI.  Since that time, she has been feeling significantly better.  She denies any more abdominal pain.  She has some bruising near her incision sites but has no other complaints.  She is tolerating a diet without nausea and vomiting.  Denies fevers and chills.  Review of Systems:  All other review of systems: otherwise negative   Vital Signs:  BP 124/78   Pulse 62   Temp 98.2 F (36.8 C) (Oral)   Resp 14   Ht 5\' 7"  (1.702 m)   Wt 296 lb (134.3 kg)   LMP 03/10/2022 (Approximate)   SpO2 96%   BMI 46.36 kg/m    Physical Exam:  Physical Exam Vitals reviewed.  Constitutional:      Appearance: Normal appearance.  Abdominal:     Comments: Abdomen soft, nondistended, no percussion tenderness, nontender to palpation; no rigidity, guarding, rebound tenderness; incision sites healing well  Neurological:     Mental Status: She is alert.     Laboratory studies: None  Imaging:  None  Pathology: A. GALLBLADDER, CHOLECYSTECTOMY:  - Acute on chronic cholecystitis with cholelithiasis   Assessment:  32 y.o. yo Female who presents for follow-up status post robotic assisted laparoscopic cholecystectomy on 9/25 with subsequent development of choledocholithiasis requiring ERCP at [facility]  Plan:  -Patient has been doing well since her ERCP.  She denies any pain, nausea, and vomiting. -We discussed the likely cause of her choledocholithiasis postoperatively. -Follow up as needed  All of the above recommendations were discussed with the patient, and all of patient's questions were  answered to her expressed satisfaction.  Theophilus Kinds, DO [facility] [address] [place], [place] [address] [phone] (office)

## 2023-03-18 NOTE — Progress Notes (Signed)
Established Patient Office Visit  Subjective:  Patient ID: Kristin Baker, female    DOB: 23-Dec-1990  Age: 32 y.o. MRN: [id number]  CC:  Chief Complaint  Patient presents with   Follow-up    F/u from hospital visit, pt reports feeling tired. Needs refills    HPI Kristin Baker is a 32 y.o. female with past medical history of subclinical hypothyroidism, essential hypertension, hot flashes, degenerative disc disease of the lumbar spine presents for hospital follow-up f/u .   Choledocholithiasis:The patient reports undergoing an ERCP for biliary obstruction on 03/03/2023. Since being discharged from the hospital, she has been feeling well overall, but she does experience some lingering fatigue. She followed up with her surgeon today, who explained that the fatigue could persist for several weeks due to the effects of anesthesia.   Hypothyroidism:The patient is concerned about having Hashimoto's thyroiditis and would like a referral to endocrinology for further evaluation. She reports that after being on thyroid medication for a while, the medication seems to stop working, and her TSH levels become elevated.   Refills requested today for Xyzal and tizanidine.  Past Medical History:  Diagnosis Date   Abdominal pain    ADHD    ANA positive    Back pain    DDD (degenerative disc disease), lumbar    Eye pain, right    Fatigue    Frontal sinusitis    Hypertension    Hypothyroidism    Iron deficiency anemia due to chronic blood loss 06/19/2022   Medical history non-contributory    Obesity, morbid (HCC)    Palpitations    Rheumatoid arthritis [facility])     Past Surgical History:  Procedure Laterality Date   ABLATION     05/22/2020 left side, 06/14/2020 right side    BIOPSY  02/17/2023   Procedure: BIOPSY;  Surgeon: Franky Macho, MD;  [facility];  Service: Endoscopy;;   COLONOSCOPY WITH PROPOFOL N/A 02/17/2023   Procedure: COLONOSCOPY WITH PROPOFOL;  Surgeon:  Franky Macho, MD;  Location: [facility];  Service: Endoscopy;  Laterality: N/A;  9:45am;asa 2   ESOPHAGOGASTRODUODENOSCOPY (EGD) WITH PROPOFOL N/A 02/17/2023   Procedure: ESOPHAGOGASTRODUODENOSCOPY (EGD) WITH PROPOFOL;  Surgeon: Franky Macho, MD;  Location: [facility];  Service: Endoscopy;  Laterality: N/A;  9:45am;asa 2   HYSTERECTOMY ABDOMINAL WITH SALPINGECTOMY     POLYPECTOMY  02/17/2023   Procedure: POLYPECTOMY;  Surgeon: Franky Macho, MD;  Location: [facility];  Service: Endoscopy;;   ROBOTIC ASSISTED TOTAL HYSTERECTOMY Bilateral 07/30/2022   Procedure: XI ROBOTIC ASSISTED TOTAL HYSTERECTOMY AND BILATERAL SALPINGECTOMY;  Surgeon: Lazaro Arms, MD;  Location: [facility] ORS;  Service: Gynecology;  Laterality: Bilateral;    Family History  Problem Relation Age of Onset   Cervical cancer Maternal Grandmother    Breast cancer Maternal Grandmother    Skin cancer Maternal Grandmother    Heart attack Maternal Grandfather    Diabetes Father    Heart attack Father    Hyperlipidemia Mother    Peripheral Artery Disease Mother    ADD / ADHD Mother    Bipolar disorder Mother    Cervical cancer Mother        at age 21.   ADD / ADHD Sister    Healthy Daughter    Breast cancer Maternal Aunt    Cervical cancer Maternal Aunt    ADD / ADHD Maternal Aunt    Uterine cancer Maternal Aunt    Breast cancer Maternal Aunt  Multiple sclerosis Paternal Aunt    Colon cancer Neg Hx     Social History   Socioeconomic History   Marital status: Married    Spouse name: Not on file   Number of children: 1   Years of education: Not on file   Highest education level: Associate degree: academic program  Occupational History   Not on file  Tobacco Use   Smoking status: Every Day    Current packs/day: 0.00    Average packs/day: 1 pack/day for 15.0 years (15.0 ttl pk-yrs)    Types: Cigarettes, E-cigarettes    Start date: 11/24/2005    Last attempt to quit: 11/24/2020    Years since  quitting: 2.3    Passive exposure: Current   Smokeless tobacco: Never   Tobacco comments:    She has quit of and on.  Vaping Use   Vaping status: Every Day  Substance and Sexual Activity   Alcohol use: Yes    Comment: occasionally /social events   Drug use: Not Currently    Comment: smoked weed during teen years   Sexual activity: Not Currently    Birth control/protection: Surgical    Comment: hyst  Other Topics Concern   Not on file  Social History Narrative   Married for May 2021.Lives with husband and daughter.Homemaker.Husband works at [employer].   Social Determinants of Health   Financial Resource Strain: Low Risk  (09/23/2022)   Overall Financial Resource Strain (CARDIA)    Difficulty of Paying Living Expenses: Not very hard  Food Insecurity: Low Risk  (03/01/2023)   Received from [facility]   Hunger Vital Sign    Worried About Running Out of Food in the Last Year: Never true    Ran Out of Food in the Last Year: Never true  Transportation Needs: No Transportation Needs (03/01/2023)   Received from [facility]    In the past 12 months, has lack of reliable transportation kept you from medical appointments, meetings, work or from getting things needed for daily living? : No  Physical Activity: Unknown (09/23/2022)   Exercise Vital Sign    Days of Exercise per Week: Patient declined    Minutes of Exercise per Session: Not on file  Stress: Stress Concern Present (09/23/2022)   [employer] of Occupational Health - Occupational Stress Questionnaire    Feeling of Stress : Very much  Social Connections: Moderately Isolated (09/23/2022)   Social Connection and Isolation Panel [NHANES]    Frequency of Communication with Friends and Family: More than three times a week    Frequency of Social Gatherings with Friends and Family: Twice a week    Attends Religious Services: Never    Database administrator or Organizations: No    Attends Museum/gallery exhibitions officer: Not on file    Marital Status: Married  Catering manager Violence: Not At Risk (02/27/2023)   Humiliation, Afraid, Rape, and Kick questionnaire    Fear of Current or Ex-Partner: No    Emotionally Abused: No    Physically Abused: No    Sexually Abused: No    Outpatient Medications Prior to Visit  Medication Sig Dispense Refill   acetaminophen (TYLENOL) 500 MG tablet Take 2 tablets (1,000 mg total) by mouth every 6 (six) hours. 30 tablet 0   albuterol (VENTOLIN HFA) 108 (90 Base) MCG/ACT inhaler Inhale 2 puffs into the lungs every 6 (six) hours as needed for wheezing or shortness of breath. 18 g  1   amphetamine-dextroamphetamine (ADDERALL XR) 20 MG 24 hr capsule Take 20 mg by mouth daily.     amphetamine-dextroamphetamine (ADDERALL) 5 MG tablet Take 5 mg by mouth daily in the afternoon.     Cholecalciferol (VITAMIN D3) 25 MCG (1000 UT) CAPS Take 1 capsule (1,000 Units total) by mouth daily. 30 capsule 3   docusate sodium (COLACE) 100 MG capsule Take 1 capsule (100 mg total) by mouth 2 (two) times daily. 60 capsule 2   FLUoxetine (PROZAC) 10 MG capsule Take 10 mg by mouth daily.     Galcanezumab-gnlm (EMGALITY) 120 MG/ML SOAJ Inject 1 pen  into the skin every 30 (thirty) days. 3 mL 3   olmesartan-hydrochlorothiazide (BENICAR HCT) 20-12.5 MG tablet TAKE 1 TABLET BY MOUTH DAILY 90 tablet 0   omeprazole (PRILOSEC) 40 MG capsule Take 1 capsule (40 mg total) by mouth daily. 30 capsule 0   psyllium (METAMUCIL) 58.6 % packet Take 1 packet by mouth 2 (two) times daily. 60 packet 2   sucralfate (CARAFATE) 1 g tablet Take 1 tablet (1 g total) by mouth 4 (four) times daily -  with meals and at bedtime. 90 tablet 0   levothyroxine (SYNTHROID) 100 MCG tablet Take 1 tablet (100 mcg total) by mouth daily. 90 tablet 1   tiZANidine (ZANAFLEX) 4 MG tablet TAKE 1 TABLET(4 MG) BY MOUTH AT BEDTIME AS NEEDED FOR MUSCLE SPASMS 30 tablet 0   levocetirizine (XYZAL) 5 MG tablet Take 1 tablet (5 mg  total) by mouth daily. 30 tablet 2   No facility-administered medications prior to visit.    Allergies  Allergen Reactions   Poison Ivy Extract Anaphylaxis   Poison Oak Extract Anaphylaxis   Anser Anser Feather (Goose) Allergy Skin Test    Flexeril [Cyclobenzaprine] Hives   Lactose Intolerance (Gi) Diarrhea   Honeysuckle Flower [Lonicera] Rash   Mixed Grasses Rash    ROS Review of Systems  Constitutional:  Positive for fatigue. Negative for chills and fever.  Eyes:  Negative for visual disturbance.  Respiratory:  Negative for chest tightness and shortness of breath.   Gastrointestinal:  Positive for vomiting. Negative for abdominal distention, diarrhea and nausea.  Neurological:  Negative for dizziness and headaches.      Objective:    Physical Exam HENT:     Head: Normocephalic.     Mouth/Throat:     Mouth: Mucous membranes are moist.  Cardiovascular:     Rate and Rhythm: Normal rate.     Heart sounds: Normal heart sounds.  Pulmonary:     Effort: Pulmonary effort is normal.     Breath sounds: Normal breath sounds.  Neurological:     Mental Status: She is alert.     BP 124/80   Pulse 91   Ht 5\' 7"  (1.702 m)   Wt 276 lb 1.3 oz (125.2 kg)   LMP 03/10/2022 (Approximate)   SpO2 94%   BMI 43.24 kg/m  Wt Readings from Last 3 Encounters:  03/18/23 276 lb 1.3 oz (125.2 kg)  03/18/23 296 lb (134.3 kg)  02/27/23 282 lb 3 oz (128 kg)    Lab Results  Component Value Date   TSH 6.470 (H) 03/10/2023   Lab Results  Component Value Date   WBC 6.0 03/10/2023   HGB 14.1 03/10/2023   HCT 43.9 03/10/2023   MCV 93 03/10/2023   PLT 409 03/10/2023   Lab Results  Component Value Date   NA 135 03/14/2023   K 4.0 03/14/2023  CO2 24 03/14/2023   GLUCOSE 108 (H) 03/14/2023   BUN 10 03/14/2023   CREATININE 0.73 03/14/2023   BILITOT 0.6 03/14/2023   ALKPHOS 290 (H) 03/01/2023   AST 24 03/14/2023   ALT 55 (H) 03/14/2023   PROT 8.0 03/14/2023   ALBUMIN 3.4 (L)  03/01/2023   CALCIUM 9.8 03/14/2023   ANIONGAP 11 03/01/2023   EGFR 113 03/10/2023   Lab Results  Component Value Date   CHOL 187 09/23/2022   Lab Results  Component Value Date   HDL 38 (L) 09/23/2022   Lab Results  Component Value Date   LDLCALC 133 (H) 09/23/2022   Lab Results  Component Value Date   TRIG 87 09/23/2022   Lab Results  Component Value Date   CHOLHDL 4.9 (H) 09/23/2022   Lab Results  Component Value Date   HGBA1C 5.3 09/23/2022      Assessment & Plan:  Choledocholithiasis Assessment & Plan: Labs and imaging studies reviewed. The patient is encouraged to rest, stay hydrated, and maintain a well-balanced diet.      Subclinical hypothyroidism Assessment & Plan: Will increase her Synthroid to 112 mcg daily Referral placed to endocrinology to rule out Hashimoto's thyroiditis   Orders: -     Levothyroxine Sodium; Take 1 tablet (112 mcg total) by mouth daily.  Dispense: 90 tablet; Refill: 1 -     Ambulatory referral to Endocrinology -     TSH + free T4  Seasonal and perennial allergic rhinitis -     Levocetirizine Dihydrochloride; Take 1 tablet (5 mg total) by mouth daily.  Dispense: 30 tablet; Refill: 2  Chronic bilateral low back pain without sciatica -     tiZANidine HCl; Take 1 tablet (4 mg total) by mouth at bedtime.  Dispense: 30 tablet; Refill: 1   Note: This chart has been completed using Engineer, civil (consulting) software, and while attempts have been made to ensure accuracy, certain words and phrases may not be transcribed as intended.   Follow-up: No follow-ups on file.   Gilmore Laroche, FNP

## 2023-03-18 NOTE — Assessment & Plan Note (Signed)
Will increase her Synthroid to 112 mcg daily Referral placed to endocrinology to rule out Hashimoto's thyroiditis

## 2023-03-18 NOTE — Assessment & Plan Note (Signed)
Labs and imaging studies reviewed. The patient is encouraged to rest, stay hydrated, and maintain a well-balanced diet.

## 2023-03-25 NOTE — Addendum Note (Signed)
Addended byGilmore Laroche on: 03/25/2023 11:21 PM   Modules accepted: Level of Service

## 2023-04-16 ENCOUNTER — Inpatient Hospital Stay: Payer: Medicaid Other | Attending: Hematology

## 2023-04-16 DIAGNOSIS — D75839 Thrombocytosis, unspecified: Secondary | ICD-10-CM | POA: Insufficient documentation

## 2023-04-16 DIAGNOSIS — D509 Iron deficiency anemia, unspecified: Secondary | ICD-10-CM | POA: Insufficient documentation

## 2023-04-16 DIAGNOSIS — D500 Iron deficiency anemia secondary to blood loss (chronic): Secondary | ICD-10-CM

## 2023-04-16 LAB — CBC WITH DIFFERENTIAL/PLATELET
Abs Immature Granulocytes: 0.03 10*3/uL (ref 0.00–0.07)
Basophils Absolute: 0.1 10*3/uL (ref 0.0–0.1)
Basophils Relative: 1 %
Eosinophils Absolute: 0.2 10*3/uL (ref 0.0–0.5)
Eosinophils Relative: 3 %
HCT: 38.7 % (ref 36.0–46.0)
Hemoglobin: 13.2 g/dL (ref 12.0–15.0)
Immature Granulocytes: 0 %
Lymphocytes Relative: 37 %
Lymphs Abs: 3.1 10*3/uL (ref 0.7–4.0)
MCH: 30.3 pg (ref 26.0–34.0)
MCHC: 34.1 g/dL (ref 30.0–36.0)
MCV: 88.8 fL (ref 80.0–100.0)
Monocytes Absolute: 0.6 10*3/uL (ref 0.1–1.0)
Monocytes Relative: 7 %
Neutro Abs: 4.4 10*3/uL (ref 1.7–7.7)
Neutrophils Relative %: 52 %
Platelets: 464 10*3/uL — ABNORMAL HIGH (ref 150–400)
RBC: 4.36 MIL/uL (ref 3.87–5.11)
RDW: 13.2 % (ref 11.5–15.5)
WBC: 8.3 10*3/uL (ref 4.0–10.5)
nRBC: 0 % (ref 0.0–0.2)

## 2023-04-16 LAB — IRON AND TIBC
Iron: 43 ug/dL (ref 28–170)
Saturation Ratios: 14 % (ref 10.4–31.8)
TIBC: 317 ug/dL (ref 250–450)
UIBC: 274 ug/dL

## 2023-04-16 LAB — FERRITIN: Ferritin: 34 ng/mL (ref 11–307)

## 2023-04-17 DIAGNOSIS — F439 Reaction to severe stress, unspecified: Secondary | ICD-10-CM | POA: Diagnosis not present

## 2023-04-17 DIAGNOSIS — F329 Major depressive disorder, single episode, unspecified: Secondary | ICD-10-CM | POA: Diagnosis not present

## 2023-04-17 DIAGNOSIS — F988 Other specified behavioral and emotional disorders with onset usually occurring in childhood and adolescence: Secondary | ICD-10-CM | POA: Diagnosis not present

## 2023-04-17 DIAGNOSIS — F909 Attention-deficit hyperactivity disorder, unspecified type: Secondary | ICD-10-CM | POA: Diagnosis not present

## 2023-04-22 NOTE — Progress Notes (Unsigned)
VIRTUAL VISIT via TELEPHONE NOTE [facility]   I connected with [name]  on 04/23/23 at  9:00 AM by telephone and verified that I am speaking with the correct person using two identifiers.  Location: Patient: Home Provider: [facility]   I discussed the limitations, risks, security and privacy concerns of performing an evaluation and management service by telephone and the availability of in person appointments. I also discussed with the patient that there may be a patient responsible charge related to this service. The patient expressed understanding and agreed to proceed.  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 is contacted today for follow-up of iron deficiency and reactive thrombocytosis.  She was last seen by Rojelio Brenner PA-C on 12/17/2022.  She received IV Feraheme x 1 on 12/20/2022.  In the interim since last visit, she was hospitalized in September 2024 for acute cholecystitis and had robotic assisted cholecystectomy on 02/19/2023.  She returned to hospital on 02/27/2023 due to abdominal pain, fever, and nausea and was found to have MRCP positive for choledocholithiasis, therefore transferred to [facility], where she underwent ERCP on 03/03/2023 and was discharged on 03/04/2023.  At today's visit, she reports feeling fatigued with some mild dyspnea on exertion and lightheadedness.  She denies any headaches, racing heartbeat, and presyncopal episodes. She has aquagenic pruritus and Raynaud's phenomenon.  No history of DVT or PE.  She does note bilateral leg swelling, left more than right, that has been ongoing for several years and reportedly began around the time that she broke her left ankle; swelling worsens throughout the day as she spends time on her feet, but improves overnight although it does not completely  resolve.  She continues to vape daily.  She no longer has periods, as she is s/p total abdominal hysterectomy in March 2024.  She has occasional scant rectal bleeding associated with constipation.  No gross hematochezia or melena.  She is taking iron tablet daily.   She has 50% energy and 100% appetite. She endorses that she is maintaining a stable weight.  REVIEW OF SYSTEMS:   Review of Systems  Constitutional:  Positive for malaise/fatigue. Negative for chills, diaphoresis, fever and weight loss.  Respiratory:  Positive for shortness of breath. Negative for cough.   Cardiovascular:  Negative for chest pain and palpitations.  Gastrointestinal:  Negative for abdominal pain, blood in stool, melena, nausea and vomiting.  Neurological:  Positive for dizziness. Negative for headaches.     PHYSICAL EXAM: (per limitations of virtual telephone visit)  The patient is alert and oriented x 3, exhibiting adequate mentation, good mood, and ability to speak in full sentences and execute sound judgement.  ASSESSMENT & PLAN:  1.  Thrombocytosis - Progressively elevated platelets since February 2022. Labs from 05/24/2022 show platelets 607 in the context of borderline anemia (Hgb 10.4) and borderline microcytosis (MCV 80). Iron studies (05/06/2022) show iron deficiency with ferritin 18 and iron saturation 5%. - She vapes daily.  She is morbidly obese.  She has been referred to rheumatology for possible autoimmune disease (thoracic spine pain, history of previous ANA positive in 2021). - JAK2 with reflex to CALR, MPL, and E12-15 was negative.  Hematology workup showed negative ANA and rheumatoid factor, but with elevated ESR and CRP. - No history of DVT or PE. - No B symptoms.  She reports aquagenic pruritus and  Raynaud's phenomenon. - Most recent labs (04/16/2023): Platelets 464, otherwise normal CBC/D.  Persistent iron deficiency with ferritin 34/iron saturation 14%.   - DIFFERENTIAL DIAGNOSIS favors  reactive thrombocytosis in the setting of iron deficiency, vaping, obesity, and possible autoimmune/inflammatory disease. - PLAN: No evidence of MPN or clonal thrombocytosis at this time.  Continue monitoring with periodic CBCs.  Continue treatment of iron deficiency as below.  Continue to encourage the patient to stop vaping.   2.  Iron deficiency anemia - Total abdominal hysterectomy in March 2024 due to severe abnormal uterine bleeding - Severe anemia in December 2023 with Hgb down to 7.7. - EGD (02/17/2023): Normal esophagus, gastritis.  Scalloped mucosa in duodenum (pathology benign with no significant pathologic changes). - Colonoscopy (02/17/2023): Polyps x 3 (hyperplastic polyps, no dysplasia or malignancy).  Nonbleeding external and internal hemorrhoids. - Intermittent hemorrhoid bleeding  - Previously donated blood regularly, no blood donation for the past year. - No prior history of blood transfusion. - Taking iron tablet daily since November 2023.  Has received IV Feraheme x 4 doses within the past 6 months, most recently on 12/20/2022 - Most recent labs (04/16/2023): Hgb 13.2/MCV 88.8, ferritin 34, iron saturation 14% - PLAN: Recommend IV Feraheme x 2 doses - Continue daily iron supplement - Repeat CBC/D, ferritin, iron/TIBC in 4 months followed by OFFICE visit    3.  Family history - Strong family history of breast, uterine, and ovarian cancer on her mother's side of the family: Mother had endometrial cancer Maternal aunt with breast cancer Maternal aunt with ovarian cancer and breast cancer Maternal grandmother with breast cancer and uterine cancer.  - PLAN: Patient was referred to genetic counselor, but did not show up for appointment.       4.  Other history - PMH: Abnormal uterine bleeding, ADHD, PCOS, degenerative disc disease, hypertension, hypothyroidism, asthma, morbid obesity, anxiety/depression - SOCIAL: She lives at home with her husband and daughter.  She is  currently unemployed.  She is a former smoker, switched to vaping 2 years ago and vapes frequently throughout the day.  She drinks occasional alcohol.  She denies any illicit drug use. - FAMILY: Strong family history of breast, uterine, and ovarian cancer on mother side of the family, as noted above.   PLAN SUMMARY: >> IV Feraheme x 2 >> Labs in 4 months = CBC/D, ferritin, iron/TIBC >> OFFICE visit in 4 months      I discussed the assessment and treatment plan with the patient. The patient was provided an opportunity to ask questions and all were answered. The patient agreed with the plan and demonstrated an understanding of the instructions.   The patient was advised to call back or seek an in-person evaluation if the symptoms worsen or if the condition fails to improve as anticipated.  I provided 22 minutes of non-face-to-face time during this encounter.  Carnella Guadalajara, PA-C 04/23/23 9:22 AM

## 2023-04-23 ENCOUNTER — Inpatient Hospital Stay (HOSPITAL_BASED_OUTPATIENT_CLINIC_OR_DEPARTMENT_OTHER): Payer: Medicaid Other | Admitting: Physician Assistant

## 2023-04-23 ENCOUNTER — Encounter: Payer: Self-pay | Admitting: Physician Assistant

## 2023-04-23 DIAGNOSIS — D75839 Thrombocytosis, unspecified: Secondary | ICD-10-CM

## 2023-04-23 DIAGNOSIS — D500 Iron deficiency anemia secondary to blood loss (chronic): Secondary | ICD-10-CM

## 2023-04-28 ENCOUNTER — Encounter: Payer: Self-pay | Admitting: Family Medicine

## 2023-04-28 ENCOUNTER — Ambulatory Visit (INDEPENDENT_AMBULATORY_CARE_PROVIDER_SITE_OTHER): Payer: Medicaid Other | Admitting: Family Medicine

## 2023-04-28 VITALS — BP 134/87 | HR 64 | Ht 67.0 in | Wt 281.1 lb

## 2023-04-28 DIAGNOSIS — E7849 Other hyperlipidemia: Secondary | ICD-10-CM | POA: Diagnosis not present

## 2023-04-28 DIAGNOSIS — R7301 Impaired fasting glucose: Secondary | ICD-10-CM | POA: Diagnosis not present

## 2023-04-28 DIAGNOSIS — B354 Tinea corporis: Secondary | ICD-10-CM | POA: Insufficient documentation

## 2023-04-28 DIAGNOSIS — F419 Anxiety disorder, unspecified: Secondary | ICD-10-CM

## 2023-04-28 DIAGNOSIS — F32A Depression, unspecified: Secondary | ICD-10-CM

## 2023-04-28 DIAGNOSIS — E559 Vitamin D deficiency, unspecified: Secondary | ICD-10-CM | POA: Diagnosis not present

## 2023-04-28 DIAGNOSIS — E038 Other specified hypothyroidism: Secondary | ICD-10-CM | POA: Diagnosis not present

## 2023-04-28 DIAGNOSIS — I10 Essential (primary) hypertension: Secondary | ICD-10-CM

## 2023-04-28 MED ORDER — CLOTRIMAZOLE 1 % EX CREA: 1.0000 | TOPICAL_CREAM | Freq: Two times a day (BID) | CUTANEOUS | 0 refills | Status: DC

## 2023-04-28 NOTE — Patient Instructions (Addendum)
I appreciate the opportunity to provide care to you today!    Follow up:  4 months  Labs: please stop by the lab today to get your blood drawn (CBC, CMP, TSH, Lipid profile, HgA1c, Vit D)   Body Ringworm Start applying clotrimazole 1% cream BID While you have a rash: Wear loose clothing to stop clothes from rubbing and irritating it. Wash or change your bed sheets every night. Wash clothes and bed sheets in hot water. Disinfect or throw out items that may be infected. Wash your hands often with soap and water for at least 20 seconds. If soap and water are not available, use hand sanitizer.  Attached with your AVS, you will find valuable resources for self-education. I highly recommend dedicating some time to thoroughly examine them.   Please continue to a heart-healthy diet and increase your physical activities. Try to exercise for at least five days a week.    It was a pleasure to see you and I look forward to continuing to work together on your health and well-being. Please do not hesitate to call the office if you need care or have questions about your care.  In case of emergency, please visit the Emergency Department for urgent care, or contact our clinic at [phone] to schedule an appointment. We're here to help you!   Have a wonderful day and week. With Gratitude, Gilmore Laroche MSN, FNP-BC

## 2023-04-28 NOTE — Assessment & Plan Note (Addendum)
The patient is currently taking Synthroid 112 mcg daily. She reports increased hair loss and fatigue, and it is important to note that she has a history of iron deficiency anemia and is scheduled to receive an iron infusion on 05/02/2023. I will assess her thyroid levels today and make any necessary adjustments to her treatment regimen based on the results. She is scheduled to follow-up with the endocrinologist on June 24, 2023 Lab Results  Component Value Date   TSH 6.470 (H) 03/10/2023

## 2023-04-28 NOTE — Assessment & Plan Note (Signed)
The patient reports discovering a rash on her left lower abdomen upon waking on 04/27/2023. She denies any burning, itching, or irritation at the affected site. The appearance of the rash is consistent with tinea corporis, and treatment with clotrimazole 1% cream, to be applied twice daily, was recommended. I encouraged the patient to wear loose clothing to prevent further irritation and rubbing of the rash, and to wash and change her bed sheets every night. Additionally, I advised washing clothing and bed linens in hot water, frequent hand washing with soap for at least 20 seconds, and avoiding sharing personal belongings. I also emphasized the importance of not touching red patches of skin on others to prevent the spread of the infection.

## 2023-04-28 NOTE — Progress Notes (Signed)
Established Patient Office Visit  Subjective:  Patient ID: Kristin Baker, female    DOB: 31-Mar-1991  Age: 32 y.o. MRN: [id number]  CC:  Chief Complaint  Patient presents with   Care Management    3 month f/u, noticed a spot on her left side has concerns    HPI Kristin Baker is a 32 y.o. female with past medical history of hypothyroidism, hypertension, anxiety and depression presents for f/u of  chronic medical conditions. For the details of today's visit, please refer to the assessment and plan.       Past Medical History:  Diagnosis Date   Abdominal pain    ADHD    ANA positive    Back pain    DDD (degenerative disc disease), lumbar    Eye pain, right    Fatigue    Frontal sinusitis    Hypertension    Hypothyroidism    Iron deficiency anemia due to chronic blood loss 06/19/2022   Medical history non-contributory    Obesity, morbid (HCC)    Palpitations    Rheumatoid arthritis [facility])     Past Surgical History:  Procedure Laterality Date   ABLATION     05/22/2020 left side, 06/14/2020 right side    BIOPSY  02/17/2023   Procedure: BIOPSY;  Surgeon: Franky Macho, MD;  Location: [facility];  Service: Endoscopy;;   COLONOSCOPY WITH PROPOFOL N/A 02/17/2023   Procedure: COLONOSCOPY WITH PROPOFOL;  Surgeon: Franky Macho, MD;  Location: [facility];  Service: Endoscopy;  Laterality: N/A;  9:45am;asa 2   ESOPHAGOGASTRODUODENOSCOPY (EGD) WITH PROPOFOL N/A 02/17/2023   Procedure: ESOPHAGOGASTRODUODENOSCOPY (EGD) WITH PROPOFOL;  Surgeon: Franky Macho, MD;  Location: [facility];  Service: Endoscopy;  Laterality: N/A;  9:45am;asa 2   HYSTERECTOMY ABDOMINAL WITH SALPINGECTOMY     POLYPECTOMY  02/17/2023   Procedure: POLYPECTOMY;  Surgeon: Franky Macho, MD;  Location: [facility];  Service: Endoscopy;;   ROBOTIC ASSISTED TOTAL HYSTERECTOMY Bilateral 07/30/2022   Procedure: XI ROBOTIC ASSISTED TOTAL HYSTERECTOMY AND BILATERAL SALPINGECTOMY;  Surgeon:  Lazaro Arms, MD;  Location: [facility] OR[facility];  Service: Gynecology;  Laterality: Bilateral;    Family History  Problem Relation Age of Onset   Cervical cancer Maternal Grandmother    Breast cancer Maternal Grandmother    Skin cancer Maternal Grandmother    Heart attack Maternal Grandfather    Diabetes Father    Heart attack Father    Hyperlipidemia Mother    Peripheral Artery Disease Mother    ADD / ADHD Mother    Bipolar disorder Mother    Cervical cancer Mother        at age 65.   ADD / ADHD Sister    Healthy Daughter    Breast cancer Maternal Aunt    Cervical cancer Maternal Aunt    ADD / ADHD Maternal Aunt    Uterine cancer Maternal Aunt    Breast cancer Maternal Aunt    Multiple sclerosis Paternal Aunt    Colon cancer Neg Hx     Social History   Socioeconomic History   Marital status: Married    Spouse name: Not on file   Number of children: 1   Years of education: Not on file   Highest education level: Associate degree: academic program  Occupational History   Not on file  Tobacco Use   Smoking status: Every Day    Current packs/day: 0.00    Average packs/day: 1 pack/day for 15.0  years (15.0 ttl pk-yrs)    Types: Cigarettes, E-cigarettes    Start date: 11/24/2005    Last attempt to quit: 11/24/2020    Years since quitting: 2.4    Passive exposure: Current   Smokeless tobacco: Never   Tobacco comments:    She has quit of and on.  Vaping Use   Vaping status: Every Day  Substance and Sexual Activity   Alcohol use: Yes    Comment: occasionally /social events   Drug use: Not Currently    Comment: smoked weed during teen years   Sexual activity: Not Currently    Birth control/protection: Surgical    Comment: hyst  Other Topics Concern   Not on file  Social History Narrative   Married for May 2021.Lives with husband and daughter.Homemaker.Husband works at [employer].   Social Determinants of Health   Financial Resource Strain: Low Risk  (09/23/2022)    Overall Financial Resource Strain (CARDIA)    Difficulty of Paying Living Expenses: Not very hard  Food Insecurity: Low Risk  (03/01/2023)   Received from [facility]   Hunger Vital Sign    Worried About Running Out of Food in the Last Year: Never true    Ran Out of Food in the Last Year: Never true  Transportation Needs: No Transportation Needs (03/01/2023)   Received from [facility]    In the past 12 months, has lack of reliable transportation kept you from medical appointments, meetings, work or from getting things needed for daily living? : No  Physical Activity: Unknown (09/23/2022)   Exercise Vital Sign    Days of Exercise per Week: Patient declined    Minutes of Exercise per Session: Not on file  Stress: Stress Concern Present (09/23/2022)   [employer] of Occupational Health - Occupational Stress Questionnaire    Feeling of Stress : Very much  Social Connections: Moderately Isolated (09/23/2022)   Social Connection and Isolation Panel [NHANES]    Frequency of Communication with Friends and Family: More than three times a week    Frequency of Social Gatherings with Friends and Family: Twice a week    Attends Religious Services: Never    Database administrator or Organizations: No    Attends Engineer, structural: Not on file    Marital Status: Married  Catering manager Violence: Not At Risk (02/27/2023)   Humiliation, Afraid, Rape, and Kick questionnaire    Fear of Current or Ex-Partner: No    Emotionally Abused: No    Physically Abused: No    Sexually Abused: No    Outpatient Medications Prior to Visit  Medication Sig Dispense Refill   acetaminophen (TYLENOL) 500 MG tablet Take 2 tablets (1,000 mg total) by mouth every 6 (six) hours. 30 tablet 0   albuterol (VENTOLIN HFA) 108 (90 Base) MCG/ACT inhaler Inhale 2 puffs into the lungs every 6 (six) hours as needed for wheezing or shortness of breath. 18 g 1   amphetamine-dextroamphetamine  (ADDERALL XR) 20 MG 24 hr capsule Take 20 mg by mouth daily.     amphetamine-dextroamphetamine (ADDERALL) 5 MG tablet Take 5 mg by mouth daily in the afternoon.     Cholecalciferol (VITAMIN D3) 25 MCG (1000 UT) CAPS Take 1 capsule (1,000 Units total) by mouth daily. 30 capsule 3   docusate sodium (COLACE) 100 MG capsule Take 1 capsule (100 mg total) by mouth 2 (two) times daily. 60 capsule 2   FLUoxetine (PROZAC) 10 MG capsule  Take 10 mg by mouth daily.     Galcanezumab-gnlm (EMGALITY) 120 MG/ML SOAJ Inject 1 pen  into the skin every 30 (thirty) days. 3 mL 3   levocetirizine (XYZAL) 5 MG tablet Take 1 tablet (5 mg total) by mouth daily. 30 tablet 2   levothyroxine (SYNTHROID) 112 MCG tablet Take 1 tablet (112 mcg total) by mouth daily. 90 tablet 1   olmesartan-hydrochlorothiazide (BENICAR HCT) 20-12.5 MG tablet TAKE 1 TABLET BY MOUTH DAILY 90 tablet 0   omeprazole (PRILOSEC) 40 MG capsule Take 1 capsule (40 mg total) by mouth daily. 30 capsule 0   sucralfate (CARAFATE) 1 g tablet Take 1 tablet (1 g total) by mouth 4 (four) times daily -  with meals and at bedtime. 90 tablet 0   tiZANidine (ZANAFLEX) 4 MG tablet Take 1 tablet (4 mg total) by mouth at bedtime. 30 tablet 1   No facility-administered medications prior to visit.    Allergies  Allergen Reactions   Poison Ivy Extract Anaphylaxis   Poison Oak Extract Anaphylaxis   Anser Anser Feather (Goose) Allergy Skin Test    Flexeril [Cyclobenzaprine] Hives   Lactose Intolerance (Gi) Diarrhea   Honeysuckle Flower [Lonicera] Rash   Mixed Grasses Rash    ROS Review of Systems  Constitutional:  Negative for chills and fever.  Eyes:  Negative for visual disturbance.  Respiratory:  Negative for chest tightness and shortness of breath.   Skin:  Positive for rash.  Neurological:  Negative for dizziness and headaches.      Objective:    Physical Exam HENT:     Head: Normocephalic.     Mouth/Throat:     Mouth: Mucous membranes are  moist.  Cardiovascular:     Rate and Rhythm: Normal rate.     Heart sounds: Normal heart sounds.  Pulmonary:     Effort: Pulmonary effort is normal.     Breath sounds: Normal breath sounds.  Skin:    Findings: Rash (tinea corporis rash on the left lower abdomen) present.  Neurological:     Mental Status: She is alert.     BP 134/87   Pulse 64   Ht 5\' 7"  (1.702 m)   Wt 281 lb 1.9 oz (127.5 kg)   LMP 03/10/2022 (Approximate)   SpO2 97%   BMI 44.03 kg/m  Wt Readings from Last 3 Encounters:  04/28/23 281 lb 1.9 oz (127.5 kg)  03/18/23 276 lb 1.3 oz (125.2 kg)  03/18/23 296 lb (134.3 kg)    Lab Results  Component Value Date   TSH 6.470 (H) 03/10/2023   Lab Results  Component Value Date   WBC 8.3 04/16/2023   HGB 13.2 04/16/2023   HCT 38.7 04/16/2023   MCV 88.8 04/16/2023   PLT 464 (H) 04/16/2023   Lab Results  Component Value Date   NA 135 03/14/2023   K 4.0 03/14/2023   CO2 24 03/14/2023   GLUCOSE 108 (H) 03/14/2023   BUN 10 03/14/2023   CREATININE 0.73 03/14/2023   BILITOT 0.6 03/14/2023   ALKPHOS 290 (H) 03/01/2023   AST 24 03/14/2023   ALT 55 (H) 03/14/2023   PROT 8.0 03/14/2023   ALBUMIN 3.4 (L) 03/01/2023   CALCIUM 9.8 03/14/2023   ANIONGAP 11 03/01/2023   EGFR 113 03/10/2023   Lab Results  Component Value Date   CHOL 187 09/23/2022   Lab Results  Component Value Date   HDL 38 (L) 09/23/2022   Lab Results  Component Value Date  LDLCALC 133 (H) 09/23/2022   Lab Results  Component Value Date   TRIG 87 09/23/2022   Lab Results  Component Value Date   CHOLHDL 4.9 (H) 09/23/2022   Lab Results  Component Value Date   HGBA1C 5.3 09/23/2022      Assessment & Plan:  Primary hypertension Assessment & Plan: Controlled Denies headaches, dizziness, blurred vision Encouraged to continue taking olmesartan-hydrochlorothiazide 20-12.5mg  daily Encouraged low-sodium diet with increased physical activity  BP Readings from Last 3 Encounters:   04/28/23 134/87  03/18/23 124/80  03/18/23 124/78      Tinea corporis Assessment & Plan: The patient reports discovering a rash on her left lower abdomen upon waking on 04/27/2023. She denies any burning, itching, or irritation at the affected site. The appearance of the rash is consistent with tinea corporis, and treatment with clotrimazole 1% cream, to be applied twice daily, was recommended. I encouraged the patient to wear loose clothing to prevent further irritation and rubbing of the rash, and to wash and change her bed sheets every night. Additionally, I advised washing clothing and bed linens in hot water, frequent hand washing with soap for at least 20 seconds, and avoiding sharing personal belongings. I also emphasized the importance of not touching red patches of skin on others to prevent the spread of the infection.    Orders: -     Clotrimazole; Apply 1 Application topically 2 (two) times daily.  Dispense: 30 g; Refill: 0  Anxiety and depression Assessment & Plan: The patient is following up and is currently under the care of both a psychiatrist and a therapist. She is taking Prozac 20 mg daily for the treatment of anxiety and depression. Her GAD-7 score is 15, and her PHQ-9 score is 12, which she attributes to increased stress from her family. She denies any suicidal thoughts or ideation. I recommended nonpharmacological management strategies, including mindfulness, meditation, adherence to a heart-healthy diet, ensuring adequate rest, and increasing physical activity. The patient was encouraged to continue follow-up with her therapist and psychiatrist as scheduled    Subclinical hypothyroidism Assessment & Plan: The patient is currently taking Synthroid 112 mcg daily. She reports increased hair loss and fatigue, and it is important to note that she has a history of iron deficiency anemia and is scheduled to receive an iron infusion on 05/02/2023. I will assess her thyroid levels  today and make any necessary adjustments to her treatment regimen based on the results. She is scheduled to follow-up with the endocrinologist on June 24, 2023 Lab Results  Component Value Date   TSH 6.470 (H) 03/10/2023      IFG (impaired fasting glucose) -     Hemoglobin A1c  Vitamin D deficiency -     VITAMIN D 25 Hydroxy (Vit-D Deficiency, Fractures)  TSH (thyroid-stimulating hormone deficiency) -     TSH + free T4  Other hyperlipidemia -     Lipid panel -     CMP14+EGFR -     CBC with Differential/Platelet  Note: This chart has been completed using Engineer, civil (consulting) software, and while attempts have been made to ensure accuracy, certain words and phrases may not be transcribed as intended.    Follow-up: Return in about 4 months (around 08/27/2023).   Gilmore Laroche, FNP

## 2023-04-28 NOTE — Assessment & Plan Note (Signed)
Controlled Denies headaches, dizziness, blurred vision Encouraged to continue taking olmesartan-hydrochlorothiazide 20-12.5mg  daily Encouraged low-sodium diet with increased physical activity  BP Readings from Last 3 Encounters:  04/28/23 134/87  03/18/23 124/80  03/18/23 124/78

## 2023-04-28 NOTE — Assessment & Plan Note (Signed)
The patient is following up and is currently under the care of both a psychiatrist and a therapist. She is taking Prozac 20 mg daily for the treatment of anxiety and depression. Her GAD-7 score is 15, and her PHQ-9 score is 12, which she attributes to increased stress from her family. She denies any suicidal thoughts or ideation. I recommended nonpharmacological management strategies, including mindfulness, meditation, adherence to a heart-healthy diet, ensuring adequate rest, and increasing physical activity. The patient was encouraged to continue follow-up with her therapist and psychiatrist as scheduled

## 2023-04-29 LAB — CMP14+EGFR
ALT: 16 [IU]/L (ref 0–32)
AST: 18 [IU]/L (ref 0–40)
Albumin: 4.4 g/dL (ref 3.9–4.9)
Alkaline Phosphatase: 85 [IU]/L (ref 44–121)
BUN/Creatinine Ratio: 5 — ABNORMAL LOW (ref 9–23)
BUN: 4 mg/dL — ABNORMAL LOW (ref 6–20)
Bilirubin Total: <0.2 mg/dL (ref 0.0–1.2)
CO2: 22 mmol/L (ref 20–29)
Calcium: 9.4 mg/dL (ref 8.7–10.2)
Chloride: 101 mmol/L (ref 96–106)
Creatinine, Ser: 0.78 mg/dL (ref 0.57–1.00)
Globulin, Total: 2.9 g/dL (ref 1.5–4.5)
Glucose: 92 mg/dL (ref 70–99)
Potassium: 4.1 mmol/L (ref 3.5–5.2)
Sodium: 138 mmol/L (ref 134–144)
Total Protein: 7.3 g/dL (ref 6.0–8.5)
eGFR: 103 mL/min/{1.73_m2} (ref >59)

## 2023-04-29 LAB — CBC WITH DIFFERENTIAL/PLATELET
Basophils Absolute: 0.1 10*3/uL (ref 0.0–0.2)
Basos: 1 %
EOS (ABSOLUTE): 0.2 10*3/uL (ref 0.0–0.4)
Eos: 3 %
Hematocrit: 42.7 % (ref 34.0–46.6)
Hemoglobin: 13.9 g/dL (ref 11.1–15.9)
Immature Grans (Abs): 0 10*3/uL (ref 0.0–0.1)
Immature Granulocytes: 0 %
Lymphocytes Absolute: 3 10*3/uL (ref 0.7–3.1)
Lymphs: 38 %
MCH: 29.8 pg (ref 26.6–33.0)
MCHC: 32.6 g/dL (ref 31.5–35.7)
MCV: 91 fL (ref 79–97)
Monocytes Absolute: 0.5 10*3/uL (ref 0.1–0.9)
Monocytes: 6 %
Neutrophils Absolute: 4.2 10*3/uL (ref 1.4–7.0)
Neutrophils: 52 %
Platelets: 464 10*3/uL — ABNORMAL HIGH (ref 150–450)
RBC: 4.67 x10E6/uL (ref 3.77–5.28)
RDW: 12.8 % (ref 11.7–15.4)
WBC: 8 10*3/uL (ref 3.4–10.8)

## 2023-04-29 LAB — HEMOGLOBIN A1C
Est. average glucose Bld gHb Est-mCnc: 108 mg/dL
Hgb A1c MFr Bld: 5.4 % (ref 4.8–5.6)

## 2023-04-29 LAB — LIPID PANEL
Chol/HDL Ratio: 5.1 {ratio} — ABNORMAL HIGH (ref 0.0–4.4)
Cholesterol, Total: 240 mg/dL — ABNORMAL HIGH (ref 100–199)
HDL: 47 mg/dL (ref >39)
LDL Chol Calc (NIH): 170 mg/dL — ABNORMAL HIGH (ref 0–99)
Triglycerides: 126 mg/dL (ref 0–149)
VLDL Cholesterol Cal: 23 mg/dL (ref 5–40)

## 2023-04-29 LAB — VITAMIN D 25 HYDROXY (VIT D DEFICIENCY, FRACTURES): Vit D, 25-Hydroxy: 22.9 ng/mL — ABNORMAL LOW (ref 30.0–100.0)

## 2023-04-29 LAB — TSH+FREE T4
Free T4: 1.02 ng/dL (ref 0.82–1.77)
TSH: 8.23 u[IU]/mL — ABNORMAL HIGH (ref 0.450–4.500)

## 2023-05-02 ENCOUNTER — Inpatient Hospital Stay: Payer: Medicaid Other | Attending: Hematology

## 2023-05-02 ENCOUNTER — Other Ambulatory Visit: Payer: Self-pay | Admitting: Family Medicine

## 2023-05-02 VITALS — BP 115/66 | HR 80 | Temp 97.8°F | Resp 20

## 2023-05-02 DIAGNOSIS — E038 Other specified hypothyroidism: Secondary | ICD-10-CM

## 2023-05-02 DIAGNOSIS — D500 Iron deficiency anemia secondary to blood loss (chronic): Secondary | ICD-10-CM

## 2023-05-02 DIAGNOSIS — E559 Vitamin D deficiency, unspecified: Secondary | ICD-10-CM

## 2023-05-02 DIAGNOSIS — D509 Iron deficiency anemia, unspecified: Secondary | ICD-10-CM | POA: Insufficient documentation

## 2023-05-02 MED ORDER — SODIUM CHLORIDE 0.9 % IV SOLN: INTRAVENOUS | Status: DC

## 2023-05-02 MED ORDER — VITAMIN D (ERGOCALCIFEROL) 1.25 MG (50000 UNIT) PO CAPS: 50000.0000 [IU] | ORAL_CAPSULE | ORAL | 1 refills | Status: DC

## 2023-05-02 MED ORDER — ACETAMINOPHEN 325 MG PO TABS
650.0000 mg | ORAL_TABLET | Freq: Once | ORAL | Status: AC
  Administered 2023-05-02: 650 mg via ORAL
  Filled 2023-05-02: qty 2

## 2023-05-02 MED ORDER — LEVOTHYROXINE SODIUM 125 MCG PO TABS: 125.0000 ug | ORAL_TABLET | Freq: Every day | ORAL | 3 refills | Status: DC

## 2023-05-02 MED ORDER — SODIUM CHLORIDE 0.9 % IV SOLN
510.0000 mg | Freq: Once | INTRAVENOUS | Status: AC
  Administered 2023-05-02: 510 mg via INTRAVENOUS
  Filled 2023-05-02: qty 510

## 2023-05-02 NOTE — Progress Notes (Signed)
Patient presents today for Feraheme infusion per providers order.  Vital signs WNL.  Patient has no new complaints at this time.  Peripheral IV started and blood return noted pre and post infusion.    Stable during infusion without adverse affects.  Vital signs stable.  No complaints at this time.  Discharge from clinic ambulatory in stable condition.  Alert and oriented X 3.  Follow up with [facility] as scheduled.  

## 2023-05-02 NOTE — Patient Instructions (Signed)
[facility] [place] - A DEPT OF [facility]  Discharge Instructions: Thank you for choosing [facility] to provide your oncology and hematology care.  If you have a lab appointment with the Cancer Center - please note that after April 8th, 2024, all labs will be drawn in the cancer center.  You do not have to check in or register with the main entrance as you have in the past but will complete your check-in in the cancer center.  Wear comfortable clothing and clothing appropriate for easy access to any Portacath or PICC line.   We strive to give you quality time with your provider. You may need to reschedule your appointment if you arrive late (15 or more minutes).  Arriving late affects you and other patients whose appointments are after yours.  Also, if you miss three or more appointments without notifying the office, you may be dismissed from the clinic at the provider's discretion.      For prescription refill requests, have your pharmacy contact our office and allow 72 hours for refills to be completed.    Today you received the following chemotherapy and/or immunotherapy agents Feraheme      To help prevent nausea and vomiting after your treatment, we encourage you to take your nausea medication as directed.  BELOW ARE SYMPTOMS THAT SHOULD BE REPORTED IMMEDIATELY: *FEVER GREATER THAN 100.4 F (38 C) OR HIGHER *CHILLS OR SWEATING *NAUSEA AND VOMITING THAT IS NOT CONTROLLED WITH YOUR NAUSEA MEDICATION *UNUSUAL SHORTNESS OF BREATH *UNUSUAL BRUISING OR BLEEDING *URINARY PROBLEMS (pain or burning when urinating, or frequent urination) *BOWEL PROBLEMS (unusual diarrhea, constipation, pain near the anus) TENDERNESS IN MOUTH AND THROAT WITH OR WITHOUT PRESENCE OF ULCERS (sore throat, sores in mouth, or a toothache) UNUSUAL RASH, SWELLING OR PAIN  UNUSUAL VAGINAL DISCHARGE OR ITCHING   Items with * indicate a potential emergency and should be followed up  as soon as possible or go to the Emergency Department if any problems should occur.  Please show the CHEMOTHERAPY ALERT CARD or IMMUNOTHERAPY ALERT CARD at check-in to the Emergency Department and triage nurse.  Should you have questions after your visit or need to cancel or reschedule your appointment, please contact [facility] [place] - A DEPT OF [facility] [phone]  and follow the prompts.  Office hours are 8:00 a.m. to 4:30 p.m. Monday - Friday. Please note that voicemails left after 4:00 p.m. may not be returned until the following business day.  We are closed weekends and major holidays. You have access to a nurse at all times for urgent questions. Please call the main number to the clinic [phone] and follow the prompts.  For any non-urgent questions, you may also contact your provider using MyChart. We now offer e-Visits for anyone 69 and older to request care online for non-urgent symptoms. For details visit [email or web address].   Also download the MyChart app! Go to the app store, search "MyChart", open the app, select Junior, and log in with your MyChart username and password.

## 2023-05-09 ENCOUNTER — Inpatient Hospital Stay: Payer: Medicaid Other

## 2023-05-12 ENCOUNTER — Inpatient Hospital Stay: Payer: Medicaid Other

## 2023-05-12 VITALS — BP 119/79 | HR 69 | Temp 98.2°F | Resp 19

## 2023-05-12 DIAGNOSIS — D509 Iron deficiency anemia, unspecified: Secondary | ICD-10-CM | POA: Diagnosis not present

## 2023-05-12 DIAGNOSIS — D500 Iron deficiency anemia secondary to blood loss (chronic): Secondary | ICD-10-CM

## 2023-05-12 MED ORDER — ACETAMINOPHEN 325 MG PO TABS
650.0000 mg | ORAL_TABLET | Freq: Once | ORAL | Status: AC
  Administered 2023-05-12: 650 mg via ORAL
  Filled 2023-05-12: qty 2

## 2023-05-12 MED ORDER — SODIUM CHLORIDE 0.9 % IV SOLN: INTRAVENOUS | Status: DC

## 2023-05-12 MED ORDER — SODIUM CHLORIDE 0.9 % IV SOLN
510.0000 mg | Freq: Once | INTRAVENOUS | Status: AC
  Administered 2023-05-12: 510 mg via INTRAVENOUS
  Filled 2023-05-12: qty 17

## 2023-05-12 NOTE — Patient Instructions (Signed)
 [facility] [place] - A DEPT OF [facility]  Discharge Instructions: Thank you for choosing [facility] to provide your oncology and hematology care.  If you have a lab appointment with the Cancer Center - please note that after April 8th, 2024, all labs will be drawn in the cancer center.  You do not have to check in or register with the main entrance as you have in the past but will complete your check-in in the cancer center.  Wear comfortable clothing and clothing appropriate for easy access to any Portacath or PICC line.   We strive to give you quality time with your provider. You may need to reschedule your appointment if you arrive late (15 or more minutes).  Arriving late affects you and other patients whose appointments are after yours.  Also, if you miss three or more appointments without notifying the office, you may be dismissed from the clinic at the provider's discretion.      For prescription refill requests, have your pharmacy contact our office and allow 72 hours for refills to be completed.    Today you received the following:  Feraheme.  Ferumoxytol Injection What is this medication? FERUMOXYTOL (FER ue MOX i tol) treats low levels of iron in your body (iron deficiency anemia). Iron is a mineral that plays an important role in making red blood cells, which carry oxygen from your lungs to the rest of your body. This medicine may be used for other purposes; ask your health care provider or pharmacist if you have questions. COMMON BRAND NAME(S): Feraheme What should I tell my care team before I take this medication? They need to know if you have any of these conditions: Anemia not caused by low iron levels High levels of iron in the blood Magnetic resonance imaging (MRI) test scheduled An unusual or allergic reaction to iron, other medications, foods, dyes, or preservatives Pregnant or trying to get pregnant Breastfeeding How should I  use this medication? This medication is injected into a vein. It is given by your care team in a hospital or clinic setting. Talk to your care team the use of this medication in children. Special care may be needed. Overdosage: If you think you have taken too much of this medicine contact a poison control center or emergency room at once. NOTE: This medicine is only for you. Do not share this medicine with others. What if I miss a dose? It is important not to miss your dose. Call your care team if you are unable to keep an appointment. What may interact with this medication? Other iron products This list may not describe all possible interactions. Give your health care provider a list of all the medicines, herbs, non-prescription drugs, or dietary supplements you use. Also tell them if you smoke, drink alcohol, or use illegal drugs. Some items may interact with your medicine. What should I watch for while using this medication? Visit your care team regularly. Tell your care team if your symptoms do not start to get better or if they get worse. You may need blood work done while you are taking this medication. You may need to follow a special diet. Talk to your care team. Foods that contain iron include: whole grains/cereals, dried fruits, beans, or peas, leafy green vegetables, and organ meats (liver, kidney). What side effects may I notice from receiving this medication? Side effects that you should report to your care team as soon as  possible: Allergic reactions--skin rash, itching, hives, swelling of the face, lips, tongue, or throat Low blood pressure--dizziness, feeling faint or lightheaded, blurry vision Shortness of breath Side effects that usually do not require medical attention (report to your care team if they continue or are bothersome): Flushing Headache Joint pain Muscle pain Nausea Pain, redness, or irritation at injection site This list may not describe all possible side  effects. Call your doctor for medical advice about side effects. You may report side effects to FDA at 1-800-FDA-1088. Where should I keep my medication? This medication is given in a hospital or clinic. It will not be stored at home. NOTE: This sheet is a summary. It may not cover all possible information. If you have questions about this medicine, talk to your doctor, pharmacist, or health care provider.  2024 Elsevier/Gold Standard (2022-10-18 00:00:00)     To help prevent nausea and vomiting after your treatment, we encourage you to take your nausea medication as directed.  BELOW ARE SYMPTOMS THAT SHOULD BE REPORTED IMMEDIATELY: *FEVER GREATER THAN 100.4 F (38 C) OR HIGHER *CHILLS OR SWEATING *NAUSEA AND VOMITING THAT IS NOT CONTROLLED WITH YOUR NAUSEA MEDICATION *UNUSUAL SHORTNESS OF BREATH *UNUSUAL BRUISING OR BLEEDING *URINARY PROBLEMS (pain or burning when urinating, or frequent urination) *BOWEL PROBLEMS (unusual diarrhea, constipation, pain near the anus) TENDERNESS IN MOUTH AND THROAT WITH OR WITHOUT PRESENCE OF ULCERS (sore throat, sores in mouth, or a toothache) UNUSUAL RASH, SWELLING OR PAIN  UNUSUAL VAGINAL DISCHARGE OR ITCHING   Items with * indicate a potential emergency and should be followed up as soon as possible or go to the Emergency Department if any problems should occur.  Please show the CHEMOTHERAPY ALERT CARD or IMMUNOTHERAPY ALERT CARD at check-in to the Emergency Department and triage nurse.  Should you have questions after your visit or need to cancel or reschedule your appointment, please contact [facility] [place] - A DEPT OF Eligha Bridegroom [facility] [phone]  and follow the prompts.  Office hours are 8:00 a.m. to 4:30 p.m. Monday - Friday. Please note that voicemails left after 4:00 p.m. may not be returned until the following business day.  We are closed weekends and major holidays. You have access to a nurse at all times for urgent  questions. Please call the main number to the clinic [phone] and follow the prompts.  For any non-urgent questions, you may also contact your provider using MyChart. We now offer e-Visits for anyone 15 and older to request care online for non-urgent symptoms. For details visit [email or web address].   Also download the MyChart app! Go to the app store, search "MyChart", open the app, select [place], and log in with your MyChart username and password.

## 2023-05-12 NOTE — Progress Notes (Signed)
Patient presents today for iron infusion.  Patient is in satisfactory condition with no new complaints voiced.  Vital signs are stable.  IV placed in L hand.  IV flushed well with good blood return noted.   Zyrtec taken at home prior to visit.  We will proceed with infusion per provider orders.    Patient tolerated infusion well with no complaints voiced.  Patient left ambulatory in stable condition.  Vital signs stable at discharge.  Follow up as scheduled.

## 2023-05-13 NOTE — Progress Notes (Unsigned)
Office Visit Note  Patient: Kristin Baker             Date of Birth: [birth year]           MRN: [id number]             PCP: Gilmore Laroche, FNP Referring: Carnella Guadalajara, * Visit Date: 05/14/2023 Occupation: @GUAROCC @  Subjective:  No chief complaint on file.   History of Present Illness: Kristin Baker is a 32 y.o. female ***   Thrombocytosis? Joint pain? ***  Labs reviewed 05/2022 ANA neg RF neg ESR 54 CRP 2.2  Activities of Daily Living:  Patient reports morning stiffness for *** {minute/hour:19697}.   Patient {ACTIONS;DENIES/REPORTS:21021675::"Denies"} nocturnal pain.  Difficulty dressing/grooming: {ACTIONS;DENIES/REPORTS:21021675::"Denies"} Difficulty climbing stairs: {ACTIONS;DENIES/REPORTS:21021675::"Denies"} Difficulty getting out of chair: {ACTIONS;DENIES/REPORTS:21021675::"Denies"} Difficulty using hands for taps, buttons, cutlery, and/or writing: {ACTIONS;DENIES/REPORTS:21021675::"Denies"}  No Rheumatology ROS completed.   PMFS History:  Patient Active Problem List   Diagnosis Date Noted   Tinea corporis 04/28/2023   Elevated liver function tests 02/28/2023   Choledocholithiasis 02/28/2023   Post-operative pain 02/27/2023   Calculus of gallbladder without cholecystitis without obstruction 02/19/2023   Hematochezia 02/19/2023   Cholecystitis 02/19/2023   Abdominal pain 02/18/2023   Acute urinary retention 02/18/2023   Gastritis and gastroduodenitis 02/17/2023   Hyperplastic polyp of ascending colon 02/17/2023   Hyperplastic polyp of sigmoid colon 02/17/2023   Hot flashes 01/24/2023   Chronic idiopathic constipation 01/22/2023   Epigastric abdominal tenderness with rebound tenderness 01/22/2023   Bilious vomiting with nausea 01/22/2023   Fecal occult blood test positive 01/22/2023   BMI 45.0-49.9, adult (HCC) 01/22/2023   Mild peripheral edema 10/23/2022   Dysmenorrhea 08/21/2022   Iron deficiency anemia due to chronic blood loss  06/19/2022   Low iron 05/06/2022   Irregular bleeding 04/11/2022   Menometrorrhagia 04/11/2022   Pelvic pain 04/11/2022   Annual physical exam 10/24/2021   Fatigue 10/24/2021   Injury of foot, left, initial encounter 10/11/2021   Left shoulder pain 08/30/2021   Seasonal and perennial allergic rhinitis 07/18/2021   Hyperlipidemia 06/25/2021   H/O multiple allergies 05/14/2021   Anxiety and depression 05/14/2021   Morbid obesity (HCC) 05/14/2021   Hypertension 05/14/2021   Allergies 05/14/2021   Chronic bilateral low back pain without sciatica 05/14/2021   Vitamin D deficiency 01/15/2021   Chronic migraine w/o aura, not intractable, w/o stat migr 01/15/2021   Chronic daily headache 01/15/2021   White matter abnormality on MRI of brain 01/15/2021   Subclinical hypothyroidism 07/10/2020   Essential hypertension 07/10/2020   DDD (degenerative disc disease), lumbar 01/11/2020    Past Medical History:  Diagnosis Date   Abdominal pain    ADHD    ANA positive    Back pain    DDD (degenerative disc disease), lumbar    Eye pain, right    Fatigue    Frontal sinusitis    Hypertension    Hypothyroidism    Iron deficiency anemia due to chronic blood loss 06/19/2022   Medical history non-contributory    Obesity, morbid (HCC)    Palpitations    Rheumatoid arthritis (HCC)     Family History  Problem Relation Age of Onset   Cervical cancer Maternal Grandmother    Breast cancer Maternal Grandmother    Skin cancer Maternal Grandmother    Heart attack Maternal Grandfather    Diabetes Father    Heart attack Father    Hyperlipidemia Mother    Peripheral Artery Disease Mother  ADD / ADHD Mother    Bipolar disorder Mother    Cervical cancer Mother        at age 34.   ADD / ADHD Sister    Healthy Daughter    Breast cancer Maternal Aunt    Cervical cancer Maternal Aunt    ADD / ADHD Maternal Aunt    Uterine cancer Maternal Aunt    Breast cancer Maternal Aunt    Multiple  sclerosis Paternal Aunt    Colon cancer Neg Hx    Past Surgical History:  Procedure Laterality Date   ABLATION     05/22/2020 left side, 06/14/2020 right side    BIOPSY  02/17/2023   Procedure: BIOPSY;  Surgeon: Franky Macho, MD;  [facility]: [facility];  Service: Endoscopy;;   COLONOSCOPY WITH PROPOFOL N/A 02/17/2023   Procedure: COLONOSCOPY WITH PROPOFOL;  Surgeon: Franky Macho, MD;  Location: [facility];  Service: Endoscopy;  Laterality: N/A;  9:45am;asa 2   ESOPHAGOGASTRODUODENOSCOPY (EGD) WITH PROPOFOL N/A 02/17/2023   Procedure: ESOPHAGOGASTRODUODENOSCOPY (EGD) WITH PROPOFOL;  Surgeon: Franky Macho, MD;  Location: [facility];  Service: Endoscopy;  Laterality: N/A;  9:45am;asa 2   HYSTERECTOMY ABDOMINAL WITH SALPINGECTOMY     POLYPECTOMY  02/17/2023   Procedure: POLYPECTOMY;  Surgeon: Franky Macho, MD;  Location: [facility];  Service: Endoscopy;;   ROBOTIC ASSISTED TOTAL HYSTERECTOMY Bilateral 07/30/2022   Procedure: XI ROBOTIC ASSISTED TOTAL HYSTERECTOMY AND BILATERAL SALPINGECTOMY;  Surgeon: Lazaro Arms, MD;  Location: [facility];  Service: Gynecology;  Laterality: Bilateral;   Social History   Social History Narrative   Married for May 2021.Lives with husband and daughter.Homemaker.Husband works at [employer].   Immunization History  Administered Date(s) Administered   Moderna Sars-Covid-2 Vaccination 10/08/2019, 11/05/2019   Tdap 08/31/2020     Objective: Vital Signs: LMP 03/10/2022 (Approximate)    Physical Exam   Musculoskeletal Exam: ***  CDAI Exam: CDAI Score: -- Patient Global: --; Provider Global: -- Swollen: --; Tender: -- Joint Exam 05/14/2023   No joint exam has been documented for this visit   There is currently no information documented on the homunculus. Go to the Rheumatology activity and complete the homunculus joint exam.  Investigation: No additional findings.  Imaging: No results found.  Recent Labs: Lab  Results  Component Value Date   WBC 8.0 04/28/2023   HGB 13.9 04/28/2023   PLT 464 (H) 04/28/2023   NA 138 04/28/2023   K 4.1 04/28/2023   CL 101 04/28/2023   CO2 22 04/28/2023   GLUCOSE 92 04/28/2023   BUN 4 (L) 04/28/2023   CREATININE 0.78 04/28/2023   BILITOT <0.2 04/28/2023   ALKPHOS 85 04/28/2023   AST 18 04/28/2023   ALT 16 04/28/2023   PROT 7.3 04/28/2023   ALBUMIN 4.4 04/28/2023   CALCIUM 9.4 04/28/2023   GFRAA 122 10/04/2020    Speciality Comments: No specialty comments available.  Procedures:  No procedures performed Allergies: Poison ivy extract, Poison oak extract, Anser anser feather (goose) allergy skin test, Flexeril [cyclobenzaprine], Lactose intolerance (gi), Honeysuckle flower [lonicera], and Mixed grasses   Assessment / Plan:     Visit Diagnoses: No diagnosis found.  Orders: No orders of the defined types were placed in this encounter.  No orders of the defined types were placed in this encounter.   Face-to-face time spent with patient was *** minutes. Greater than 50% of time was spent in counseling and coordination of care.  Follow-Up  Instructions: No follow-ups on file.   Fuller Plan, MD  Note - This record has been created using [employer].  Chart creation errors have been sought, but may not always  have been located. Such creation errors do not reflect on  the standard of medical care.

## 2023-05-14 ENCOUNTER — Encounter: Payer: Self-pay | Admitting: Internal Medicine

## 2023-05-14 ENCOUNTER — Ambulatory Visit: Payer: Medicaid Other | Attending: Internal Medicine | Admitting: Internal Medicine

## 2023-05-14 VITALS — BP 131/88 | HR 70 | Resp 14 | Ht 66.5 in | Wt 280.0 lb

## 2023-05-14 DIAGNOSIS — R700 Elevated erythrocyte sedimentation rate: Secondary | ICD-10-CM | POA: Diagnosis not present

## 2023-05-14 DIAGNOSIS — D500 Iron deficiency anemia secondary to blood loss (chronic): Secondary | ICD-10-CM

## 2023-05-14 DIAGNOSIS — R21 Rash and other nonspecific skin eruption: Secondary | ICD-10-CM | POA: Diagnosis not present

## 2023-05-14 DIAGNOSIS — E038 Other specified hypothyroidism: Secondary | ICD-10-CM | POA: Diagnosis not present

## 2023-05-14 MED ORDER — TRIAMCINOLONE ACETONIDE 0.5 % EX OINT: 1.0000 | TOPICAL_OINTMENT | Freq: Two times a day (BID) | CUTANEOUS | 0 refills | Status: DC | PRN

## 2023-05-19 LAB — IGG, IGA, IGM
IgG (Immunoglobin G), Serum: 1469 mg/dL (ref 600–1640)
IgM, Serum: 158 mg/dL (ref 50–300)
Immunoglobulin A: 300 mg/dL (ref 47–310)

## 2023-05-19 LAB — ANTI-DNA ANTIBODY, DOUBLE-STRANDED: ds DNA Ab: <1 [IU]/mL

## 2023-05-19 LAB — INTRINSIC FACTOR ANTIBODIES: Intrinsic Factor: NEGATIVE

## 2023-05-19 LAB — SJOGRENS SYNDROME-A EXTRACTABLE NUCLEAR ANTIBODY: SSA (Ro) (ENA) Antibody, IgG: <1 AI

## 2023-05-19 LAB — C3 AND C4
C3 Complement: 141 mg/dL (ref 83–193)
C4 Complement: 17 mg/dL (ref 15–57)

## 2023-05-19 LAB — THYROID PEROXIDASE ANTIBODIES (TPO) (REFL): Thyroperoxidase Ab SerPl-aCnc: 332 [IU]/mL — ABNORMAL HIGH (ref <9)

## 2023-05-19 LAB — ANTI-SMITH ANTIBODY: ENA SM Ab Ser-aCnc: <1 AI

## 2023-05-19 LAB — SEDIMENTATION RATE: Sed Rate: 29 mm/h — ABNORMAL HIGH (ref 0–20)

## 2023-06-03 DIAGNOSIS — F988 Other specified behavioral and emotional disorders with onset usually occurring in childhood and adolescence: Secondary | ICD-10-CM | POA: Diagnosis not present

## 2023-06-03 DIAGNOSIS — F329 Major depressive disorder, single episode, unspecified: Secondary | ICD-10-CM | POA: Diagnosis not present

## 2023-06-03 DIAGNOSIS — F439 Reaction to severe stress, unspecified: Secondary | ICD-10-CM | POA: Diagnosis not present

## 2023-06-12 NOTE — Progress Notes (Signed)
PATIENT: Kristin Baker DOB: 04-23-1991  REASON FOR VISIT: follow up HISTORY FROM: patient  Virtual Visit via Telephone Note  I connected with Kristin Baker on 06/18/23 at 10:30 AM EST by telephone and verified that I am speaking with the correct person using two identifiers.   I discussed the limitations, risks, security and privacy concerns of performing an evaluation and management service by telephone and the availability of in person appointments. I also discussed with the patient that there may be a patient responsible charge related to this service. The patient expressed understanding and agreed to proceed.   History of Present Illness:  06/18/23 ALL (Mychart):  Kristin Baker returns for follow up for migraines. She was last seen 05/2022 and stable on Emgality and rizatriptan. She reports headaches have worsened over the past 3-4 months. She has had difficulty with GI and anemia issues and feels this may contribute. She has lost about 20lbs. She reports having about 12 headache days a month. Some resolve spontaneously. She takes Tylenol for back pain but does not help migraines. She has to treat with rizatriptan about 3-4 times a month. Rizatriptan does work well but makes he sleepy.   06/12/2022 ALL:  Jennis Thornberg is a 33 y.o. female here today for follow up for migraines. She was last seen by Dr Epimenio Foot 05/2021 and reported daily headaches with at least 15 migrainous days. She was started on Emgality for prevention. Topiramate d/c'd due to intolerance. Rizatriptan continued for abortive therapy. Since, she reports headaches did improve. She had 1-2 headache days on average. Rare migraines. Over the past two months, she has started having more daily tension/pressure headaches. This seemed to start around the time she started having more menstrual bleeding. IUD placed last wee. She continues Megace. She feels she is more sensitive to light. She was seen by ophthalmology for questionable  concerns of glaucoma. She has follow up in June. She is followed by GYN and recently referred to hematology for elevated platelets. She has appt next week.    HISTORY (copied from Dr Bonnita Hollow previous note)  Kristin Baker is a 33 y.o. woman with chronic headaches and abnormal brain MRI.   Update 06/12/2021: She has daily headaches, about half with migrainous features since 06/12/22   She did not get a benefit from topiramate and she had tolerability issues.    Caffeine limitations and other lifetyle modifications have not helped.  She notes that Maxalt-MLT will help abort the migrainous headaches when they occur.     She notes the pain in the right frontal region as a throbbing.  Others are in the right occiput as a searing hot poker.   When HA intensifies (1-2 times a week) she gets increased pain and also has nausea and much worse photophobia.    When a HA occurs, she has trouble focusing.   Medications tried: Topiramate was not tolerated well and did not help.   She has tried Excedrin, ibuprofen and Aleve without benefit.  SOme of these also upser her stomach.  Cyclobenzaprine was not tolerated.   Other She often has trouble falling asleep and feels there are some nights that she only sleeps 2 hours.  She has not been told that she snores.   She has an aunt with MS. Marylen has not had episodes of numbness, visual changes, ataxia or other symptoms that would be typical for demyelination.     Imaging: MRI of the cervical spine 01/04/2021 showed minimal disc bulges at C3-C4, C5-C6 and  C6-C7 that did not lead to nerve root compression or spinal stenosis.  There is a small syrinx adjacent to C6 unlikely to be significant.   MRI of the brain 05/24/2020 was fairly normal with just a single T2/flair hyperintense focus in the right parietal periventricular white matter.  This is nonspecific   Observations/Objective:   Generalized: Well developed, in no acute distress  Mentation: Alert  oriented to time, place, history taking. Follows all commands speech and language fluent   Assessment and Plan:  33 y.o. year old female  has a past medical history of Abdominal pain, ADHD, ANA positive, Back pain, DDD (degenerative disc disease), lumbar, Eye pain, right, Fatigue, Frontal sinusitis, Hypertension, Hypothyroidism, Iron deficiency anemia due to chronic blood loss (06/19/2022), Medical history non-contributory, Obesity, morbid (HCC), Palpitations, and Rheumatoid arthritis (HCC). here with    ICD-10-CM   1. Chronic migraine w/o aura, not intractable, w/o stat migr  G43.709      Kristin Baker reports migraines have worsened over the past 3-4 months. We will switch Emgality to Ajovy. She will continue rizatriptan as needed for abortive therapy. Healthy lifestyle habits encouraged. She will continue close follow up with her care team. She will follow up with me in 6 months, sooner if needed.    No orders of the defined types were placed in this encounter.   Meds ordered this encounter  Medications   Fremanezumab-vfrm (AJOVY) 225 MG/1.5ML SOAJ    Sig: Inject 225 mg into the skin every 30 (thirty) days.    Dispense:  4.5 mL    Refill:  3    Supervising Provider:   Anson Fret [[id number]]     Follow Up Instructions:  I discussed the assessment and treatment plan with the patient. The patient was provided an opportunity to ask questions and all were answered. The patient agreed with the plan and demonstrated an understanding of the instructions.   The patient was advised to call back or seek an in-person evaluation if the symptoms worsen or if the condition fails to improve as anticipated.  I provided 20 minutes of non-face-to-face time during this encounter. Patient located at their place of residence during Mychart visit. Provider is in the office.    Shawnie Dapper, NP

## 2023-06-18 ENCOUNTER — Telehealth: Payer: Medicaid Other | Admitting: Family Medicine

## 2023-06-18 ENCOUNTER — Encounter: Payer: Self-pay | Admitting: Family Medicine

## 2023-06-18 DIAGNOSIS — G43709 Chronic migraine without aura, not intractable, without status migrainosus: Secondary | ICD-10-CM

## 2023-06-18 MED ORDER — AJOVY 225 MG/1.5ML ~~LOC~~ SOAJ
225.0000 mg | SUBCUTANEOUS | 3 refills | Status: AC
Start: 2023-06-18 — End: ?

## 2023-06-18 NOTE — Patient Instructions (Signed)
Below is our plan:  We will switch Emgality to Ajovy. Continue 1 injection every 30 days. Continue rizatriptan as needed. Continue close follow up with your care team for GI concerns and anemia.   Please make sure you are staying well hydrated. I recommend 50-60 ounces daily. Well balanced diet and regular exercise encouraged. Consistent sleep schedule with 6-8 hours recommended.   Please continue follow up with care team as directed.   Follow up with me in 6 months   You may receive a survey regarding today's visit. I encourage you to leave honest feed back as I do use this information to improve patient care. Thank you for seeing me today!   GENERAL HEADACHE INFORMATION:   Natural supplements: Magnesium Oxide or Magnesium Glycinate 500 mg at bed (up to 800 mg daily) Coenzyme Q10 300 mg in AM Vitamin B2- 200 mg twice a day   Add 1 supplement at a time since even natural supplements can have undesirable side effects. You can sometimes buy supplements cheaper (especially Coenzyme Q10) at www.WebmailGuide.co.za or at [facility].  Migraine with aura: There is increased risk for stroke in women with migraine with aura and a contraindication for the combined contraceptive pill for use by women who have migraine with aura. The risk for women with migraine without aura is lower. However other risk factors like smoking are far more likely to increase stroke risk than migraine. There is a recommendation for no smoking and for the use of OCPs without estrogen such as progestogen only pills particularly for women with migraine with aura.Marland Kitchen People who have migraine headaches with auras may be 3 times more likely to have a stroke caused by a blood clot, compared to migraine patients who don't see auras. Women who take hormone-replacement therapy may be 30 percent more likely to suffer a clot-based stroke than women not taking medication containing estrogen. Other risk factors like smoking and high blood pressure may be   much more important.    Vitamins and herbs that show potential:   Magnesium: Magnesium (250 mg twice a day or 500 mg at bed) has a relaxant effect on smooth muscles such as blood vessels. Individuals suffering from frequent or daily headache usually have low magnesium levels which can be increase with daily supplementation of 400-750 mg. Three trials found 40-90% average headache reduction  when used as a preventative. Magnesium may help with headaches are aura, the best evidence for magnesium is for migraine with aura is its thought to stop the cortical spreading depression we believe is the pathophysiology of migraine aura.Magnesium also demonstrated the benefit in menstrually related migraine.  Magnesium is part of the messenger system in the serotonin cascade and it is a good muscle relaxant.  It is also useful for constipation which can be a side effect of other medications used to treat migraine. Good sources include nuts, whole grains, and tomatoes. Side Effects: loose stool/diarrhea  Riboflavin (vitamin B 2) 200 mg twice a day. This vitamin assists nerve cells in the production of ATP a principal energy storing molecule.  It is necessary for many chemical reactions in the body.  There have been at least 3 clinical trials of riboflavin using 400 mg per day all of which suggested that migraine frequency can be decreased.  All 3 trials showed significant improvement in over half of migraine sufferers.  The supplement is found in bread, cereal, milk, meat, and poultry.  Most Americans get more riboflavin than the recommended daily allowance, however  riboflavin deficiency is not necessary for the supplements to help prevent headache. Side effects: energizing, green urine   Coenzyme Q10: This is present in almost all cells in the body and is critical component for the conversion of energy.  Recent studies have shown that a nutritional supplement of CoQ10 can reduce the frequency of migraine attacks by  improving the energy production of cells as with riboflavin.  Doses of 150 mg twice a day have been shown to be effective.   Melatonin: Increasing evidence shows correlation between melatonin secretion and headache conditions.  Melatonin supplementation has decreased headache intensity and duration.  It is widely used as a sleep aid.  Sleep is natures way of dealing with migraine.  A dose of 3 mg is recommended to start for headaches including cluster headache. Higher doses up to 15 mg has been reviewed for use in Cluster headache and have been used. The rationale behind using melatonin for cluster is that many theories regarding the cause of Cluster headache center around the disruption of the normal circadian rhythm in the brain.  This helps restore the normal circadian rhythm.   HEADACHE DIET: Foods and beverages which may trigger migraine Note that only 20% of headache patients are food sensitive. You will know if you are food sensitive if you get a headache consistently 20 minutes to 2 hours after eating a certain food. Only cut out a food if it causes headaches, otherwise you might remove foods you enjoy! What matters most for diet is to eat a well balanced healthy diet full of vegetables and low fat protein, and to not miss meals.   Chocolate, other sweets ALL cheeses except cottage and cream cheese Dairy products, yogurt, sour cream, ice cream Liver Meat extracts (Bovril, Marmite, meat tenderizers) Meats or fish which have undergone aging, fermenting, pickling or smoking. These include: Hotdogs,salami,Lox,sausage, mortadellas,smoked salmon, pepperoni, Pickled herring Pods of broad bean (English beans, Chinese pea pods, [place] (fava) beans, lima and navy beans Ripe avocado, ripe banana Yeast extracts or active yeast preparations such as Brewer's or Fleishman's (commercial bakes goods are permitted) Tomato based foods, pizza (lasagna, etc.)   MSG (monosodium glutamate) is disguised as many  things; look for these common aliases: Monopotassium glutamate Autolysed yeast Hydrolysed protein Sodium caseinate "flavorings" "all natural preservatives" Nutrasweet   Avoid all other foods that convincingly provoke headaches.   Resources: The Dizzy [place] Laundry Your Headache Diet, migrainestrong.com  https://zamora-andrews.com/   Caffeine and Migraine For patients that have migraine, caffeine intake more than 3 days per week can lead to dependency and increased migraine frequency. I would recommend cutting back on your caffeine intake as best you can. The recommended amount of caffeine is 200-300 mg daily, although migraine patients may experience dependency at even lower doses. While you may notice an increase in headache temporarily, cutting back will be helpful for headaches in the long run. For more information on caffeine and migraine, visit: https://americanmigrainefoundation.org/resource-library/caffeine-and-migraine/   Headache Prevention Strategies:   1. Maintain a headache diary; learn to identify and avoid triggers.  - This can be a simple note where you log when you had a headache, associated symptoms, and medications used - There are several smartphone apps developed to help track migraines: Migraine Buddy, Migraine Monitor, Curelator N1-Headache App   Common triggers include: Emotional triggers: Emotional/Upset family or friends Emotional/Upset occupation Business reversal/success Anticipation anxiety Crisis-serious Post-crisis periodNew job/position   Physical triggers: Vacation Day Weekend Strenuous Exercise High Altitude Location New Move Menstrual Day Physical  Illness Oversleep/Not enough sleep Weather changes Light: Photophobia or light sesnitivity treatment involves a balance between desensitization and reduction in overly strong input. Use dark polarized glasses outside, but not inside. Avoid bright or  fluorescent light, but do not dim environment to the point that going into a normally lit room hurts. Consider FL-41 tint lenses, which reduce the most irritating wavelengths without blocking too much light.  These can be obtained at axonoptics.com or theraspecs.com Foods: see list above.   2. Limit use of acute treatments (over-the-counter medications, triptans, etc.) to no more than 2 days per week or 10 days per month to prevent medication overuse headache (rebound headache).     3. Follow a regular schedule (including weekends and holidays): Don't skip meals. Eat a balanced diet. 8 hours of sleep nightly. Minimize stress. Exercise 30 minutes per day. Being overweight is associated with a 5 times increased risk of chronic migraine. Keep well hydrated and drink 6-8 glasses of water per day.   4. Initiate non-pharmacologic measures at the earliest onset of your headache. Rest and quiet environment. Relax and reduce stress. Breathe2Relax is a free app that can instruct you on    some simple relaxtion and breathing techniques. Http://Dawnbuse.com is a    free website that provides teaching videos on relaxation.  Also, there are  many apps that   can be downloaded for "mindful" relaxation.  An app called YOGA NIDRA will help walk you through mindfulness. Another app called Calm can be downloaded to give you a structured mindfulness guide with daily reminders and skill development. Headspace for guided meditation Mindfulness Based Stress Reduction Online Course: www.palousemindfulness.com Cold compresses.   5. Don't wait!! Take the maximum allowable dosage of prescribed medication at the first sign of migraine.   6. Compliance:  Take prescribed medication regularly as directed and at the first sign of a migraine.   7. Communicate:  Call your physician when problems arise, especially if your headaches change, increase in frequency/severity, or become associated with neurological symptoms (weakness,  numbness, slurred speech, etc.). Proceed to emergency room if you experience new or worsening symptoms or symptoms do not resolve, if you have new neurologic symptoms or if headache is severe, or for any concerning symptom.   8. Headache/pain management therapies: Consider various complementary methods, including medication, behavioral therapy, psychological counselling, biofeedback, massage therapy, acupuncture, dry needling, and other modalities.  Such measures may reduce the need for medications. Counseling for pain management, where patients learn to function and ignore/minimize their pain, seems to work very well.   9. Recommend changing family's attention and focus away from patient's headaches. Instead, emphasize daily activities. If first question of day is 'How are your headaches/Do you have a headache today?', then patient will constantly think about headaches, thus making them worse. Goal is to re-direct attention away from headaches, toward daily activities and other distractions.   10. Helpful Websites: www.AmericanHeadacheSociety.org PatentHood.ch www.headaches.org TightMarket.nl www.achenet.org

## 2023-06-23 NOTE — Patient Instructions (Signed)

## 2023-06-24 ENCOUNTER — Encounter: Payer: Self-pay | Admitting: Nurse Practitioner

## 2023-06-24 ENCOUNTER — Ambulatory Visit: Payer: Medicaid Other | Admitting: Nurse Practitioner

## 2023-06-24 VITALS — BP 123/79 | HR 76 | Ht 67.0 in | Wt 289.2 lb

## 2023-06-24 DIAGNOSIS — E063 Autoimmune thyroiditis: Secondary | ICD-10-CM

## 2023-06-24 NOTE — Progress Notes (Signed)
Endocrinology Consult Note                                         06/24/2023, 10:20 AM  Subjective:   Subjective    Kristin Baker is a 33 y.o.-year-old female patient being seen in consultation for hypothyroidism referred by Gilmore Laroche, FNP.   Past Medical History:  Diagnosis Date   Abdominal pain    ADHD    ANA positive    Back pain    DDD (degenerative disc disease), lumbar    Eye pain, right    Fatigue    Frontal sinusitis    Hypertension    Hypothyroidism    Iron deficiency anemia due to chronic blood loss 06/19/2022   Medical history non-contributory    Obesity, morbid (HCC)    Palpitations    Rheumatoid arthritis [facility])     Past Surgical History:  Procedure Laterality Date   ABLATION     05/22/2020 left side, 06/14/2020 right side    BIOPSY  02/17/2023   Procedure: BIOPSY;  Surgeon: Franky Macho, MD;  Location: [facility];  Service: Endoscopy;;   CHOLECYSTECTOMY     COLONOSCOPY WITH PROPOFOL N/A 02/17/2023   Procedure: COLONOSCOPY WITH PROPOFOL;  Surgeon: Franky Macho, MD;  Location: [facility];  Service: Endoscopy;  Laterality: N/A;  9:45am;asa 2   ESOPHAGOGASTRODUODENOSCOPY (EGD) WITH PROPOFOL N/A 02/17/2023   Procedure: ESOPHAGOGASTRODUODENOSCOPY (EGD) WITH PROPOFOL;  Surgeon: Franky Macho, MD;  Location: [facility];  Service: Endoscopy;  Laterality: N/A;  9:45am;asa 2   HYSTERECTOMY ABDOMINAL WITH SALPINGECTOMY     POLYPECTOMY  02/17/2023   Procedure: POLYPECTOMY;  Surgeon: Franky Macho, MD;  Location: [facility];  Service: Endoscopy;;   ROBOTIC ASSISTED TOTAL HYSTERECTOMY Bilateral 07/30/2022   Procedure: XI ROBOTIC ASSISTED TOTAL HYSTERECTOMY AND BILATERAL SALPINGECTOMY;  Surgeon: Lazaro Arms, MD;  Location: [facility];  Service: Gynecology;  Laterality: Bilateral;    Social History   Socioeconomic History   Marital status: Married    Spouse name:  Not on file   Number of children: 1   Years of education: Not on file   Highest education level: Associate degree: academic program  Occupational History   Not on file  Tobacco Use   Smoking status: Former    Current packs/day: 0.00    Average packs/day: 1 pack/day for 15.0 years (15.0 ttl pk-yrs)    Types: Cigarettes, E-cigarettes    Start date: 11/24/2005    Quit date: 11/24/2020    Years since quitting: 2.5    Passive exposure: Current   Smokeless tobacco: Never   Tobacco comments:    She has quit of and on.  Vaping Use   Vaping status: Some Days  Substance and Sexual Activity   Alcohol use: Yes    Comment: occasionally /social events   Drug use: Not Currently    Comment: smoked weed during teen years   Sexual activity: Not Currently    Birth control/protection: Surgical  Comment: hyst  Other Topics Concern   Not on file  Social History Narrative   Married for May 2021.Lives with husband and daughter.Homemaker.Husband works at [employer].   Social Drivers of Corporate investment banker Strain: Low Risk  (09/23/2022)   Overall Financial Resource Strain (CARDIA)    Difficulty of Paying Living Expenses: Not very hard  Food Insecurity: Low Risk  (03/01/2023)   Received from [facility]   Hunger Vital Sign    Worried About Running Out of Food in the Last Year: Never true    Ran Out of Food in the Last Year: Never true  Transportation Needs: No Transportation Needs (03/01/2023)   Received from [facility]    In the past 12 months, has lack of reliable transportation kept you from medical appointments, meetings, work or from getting things needed for daily living? : No  Physical Activity: Unknown (09/23/2022)   Exercise Vital Sign    Days of Exercise per Week: Patient declined    Minutes of Exercise per Session: Not on file  Stress: Stress Concern Present (09/23/2022)   [employer] of Occupational Health - Occupational Stress  Questionnaire    Feeling of Stress : Very much  Social Connections: Moderately Isolated (09/23/2022)   Social Connection and Isolation Panel [NHANES]    Frequency of Communication with Friends and Family: More than three times a week    Frequency of Social Gatherings with Friends and Family: Twice a week    Attends Religious Services: Never    Database administrator or Organizations: No    Attends Engineer, structural: Not on file    Marital Status: Married    Family History  Problem Relation Age of Onset   Cervical cancer Maternal Grandmother    Breast cancer Maternal Grandmother    Skin cancer Maternal Grandmother    Heart attack Maternal Grandfather    Diabetes Father    Heart attack Father    Hyperlipidemia Mother    Peripheral Artery Disease Mother    ADD / ADHD Mother    Bipolar disorder Mother    Cervical cancer Mother        at age 57.   ADD / ADHD Sister    Healthy Daughter    Breast cancer Maternal Aunt    Cervical cancer Maternal Aunt    ADD / ADHD Maternal Aunt    Uterine cancer Maternal Aunt    Breast cancer Maternal Aunt    Multiple sclerosis Paternal Aunt    Colon cancer Neg Hx     Outpatient Encounter Medications as of 06/24/2023  Medication Sig   acetaminophen (TYLENOL) 500 MG tablet Take 2 tablets (1,000 mg total) by mouth every 6 (six) hours.   albuterol (VENTOLIN HFA) 108 (90 Base) MCG/ACT inhaler Inhale 2 puffs into the lungs every 6 (six) hours as needed for wheezing or shortness of breath.   amphetamine-dextroamphetamine (ADDERALL XR) 20 MG 24 hr capsule Take 20 mg by mouth daily.   amphetamine-dextroamphetamine (ADDERALL) 5 MG tablet Take 5 mg by mouth daily in the afternoon.   FLUoxetine (PROZAC) 20 MG capsule Take 20 mg by mouth daily.   Fremanezumab-vfrm (AJOVY) 225 MG/1.5ML SOAJ Inject 225 mg into the skin every 30 (thirty) days.   levocetirizine (XYZAL) 5 MG tablet Take 1 tablet (5 mg total) by mouth daily.   levothyroxine (SYNTHROID)  125 MCG tablet Take 1 tablet (125 mcg total) by mouth daily.   olmesartan-hydrochlorothiazide (  BENICAR HCT) 20-12.5 MG tablet TAKE 1 TABLET BY MOUTH DAILY   tiZANidine (ZANAFLEX) 4 MG tablet Take 1 tablet (4 mg total) by mouth at bedtime.   triamcinolone ointment (KENALOG) 0.5 % Apply 1 Application topically 2 (two) times daily as needed.   Vitamin D, Ergocalciferol, (DRISDOL) 1.25 MG (50000 UNIT) CAPS capsule Take 1 capsule (50,000 Units total) by mouth every 7 (seven) days.   [DISCONTINUED] omeprazole (PRILOSEC) 40 MG capsule Take 1 capsule (40 mg total) by mouth daily.   [DISCONTINUED] Cholecalciferol (VITAMIN D3) 25 MCG (1000 UT) CAPS Take 1 capsule (1,000 Units total) by mouth daily. (Patient not taking: Reported on 05/14/2023)   [DISCONTINUED] clotrimazole (ANTIFUNGAL CLOTRIMAZOLE) 1 % cream Apply 1 Application topically 2 (two) times daily. (Patient not taking: Reported on 06/24/2023)   [DISCONTINUED] docusate sodium (COLACE) 100 MG capsule Take 1 capsule (100 mg total) by mouth 2 (two) times daily. (Patient not taking: Reported on 05/14/2023)   [DISCONTINUED] FLUoxetine (PROZAC) 10 MG capsule Take 10 mg by mouth daily. (Patient not taking: Reported on 05/14/2023)   [DISCONTINUED] sucralfate (CARAFATE) 1 g tablet Take 1 tablet (1 g total) by mouth 4 (four) times daily -  with meals and at bedtime. (Patient not taking: Reported on 05/14/2023)   No facility-administered encounter medications on file as of 06/24/2023.    ALLERGIES: Allergies  Allergen Reactions   Poison Ivy Extract Anaphylaxis   Poison Oak Extract Anaphylaxis   Anser Anser Feather (Goose) Allergy Skin Test    Flexeril [Cyclobenzaprine] Hives   Lactose Intolerance (Gi) Diarrhea   Honeysuckle Flower [Lonicera] Rash   Mixed Grasses Rash   VACCINATION STATUS: Immunization History  Administered Date(s) Administered   Moderna Sars-Covid-2 Vaccination 10/08/2019, 11/05/2019   Tdap 08/31/2020     HPI   Derian Pfost   is a patient with the above medical history. she was diagnosed with hypothyroidism at approximate age of 5 years, which required subsequent initiation of thyroid hormone replacement therapy. She was first diagnosed when she was with Dr. Karilyn Cota (or his NP Jiles Prows).  After his passing, her care was transferred to her current PCP.  she was given various doses of Levothyroxine over the years, currently on 125 micrograms.  This has been changed several times by her PCP recently. she reports compliance to this medication:  but she does note she takes it with her BP and allergy pill in the morning.  She does take iron supplement, but takes that at night.  She notes her PCP told her it was ok to take all her medications together.  I reviewed patient's thyroid tests:  Lab Results  Component Value Date   TSH 8.230 (H) 04/28/2023   TSH 6.470 (H) 03/10/2023   TSH 8.570 (H) 01/23/2023   TSH 8.360 (H) 09/23/2022   TSH 2.530 04/26/2022   TSH 3.390 10/19/2021   TSH 14.800 (H) 08/27/2021   TSH 1.200 06/25/2021   TSH 1.040 06/19/2021   TSH 19.900 (H) 05/14/2021   FREET4 1.02 04/28/2023   FREET4 0.94 03/10/2023   FREET4 1.25 01/23/2023   FREET4 0.70 (L) 09/23/2022   FREET4 1.13 04/26/2022   FREET4 0.94 10/19/2021   FREET4 0.72 (L) 06/25/2021   FREET4 0.9 10/04/2020   FREET4 0.7 (L) 07/06/2020   FREET4 0.7 (L) 04/04/2020     Pt describes: - appetite fluctuations - dizziness - fatigue - insomnia - uncontrolled depression (on meds) - gut issues - intermittent trouble swallowing (mostly big bites of food).  Pt denies feeling  nodules in neck, hoarseness, SOB with lying down.  she does have extensive family history of autoimmune disorders and has family history of thyroid disorders in her uncle (total thyroidectomy for unknown reason), grandmother (? Hypothyroidism) and mom (recently diagnosed with thyroid issues).  No family history of thyroid cancer.  No history of radiation therapy to head or  neck.  No recent use of iodine supplements.  Denies use of Biotin containing supplements.  I reviewed her chart and she also has a history of migraines, HTN, constipation, depression/anxiety, HLD, anemia, and vitamin d deficiency.   ROS:  Constitutional: no weight gain/loss, + severe fatigue, + intermittent subjective hyperthermia (mostly at night), no subjective hypothermia Eyes: no blurry vision, no xerophthalmia ENT: no sore throat, no nodules palpated in throat, + intermittent dysphagia/odynophagia, no hoarseness Cardiovascular: no chest pain, no SOB, no palpitations, no leg swelling Respiratory: no cough, no SOB Gastrointestinal: reports intermittent gut issues for quite some time Musculoskeletal: + diffuse muscle/joint aches-  sees rheumatologist Skin: no rashes Neurological: no tremors, no numbness, no tingling, + dizziness spells Psychiatric: + depression, no anxiety, + insomnia   Objective:   Objective     BP 123/79 (BP Location: Left Arm, Patient Position: Sitting, Cuff Size: Large)   Pulse 76   Ht 5\' 7"  (1.702 m)   Wt 289 lb 3.2 oz (131.2 kg)   LMP 03/10/2022 (Approximate)   BMI 45.30 kg/m  Wt Readings from Last 3 Encounters:  06/24/23 289 lb 3.2 oz (131.2 kg)  05/14/23 280 lb (127 kg)  04/28/23 281 lb 1.9 oz (127.5 kg)    BP Readings from Last 3 Encounters:  06/24/23 123/79  05/14/23 131/88  05/12/23 119/79     Constitutional:  Body mass index is 45.3 kg/m., not in acute distress, normal state of mind Eyes: PERRLA, EOMI, no exophthalmos ENT: moist mucous membranes, no thyromegaly, L>R?, no cervical lymphadenopathy, does have small mobile lump behind right ear, comes and goes per patient report Cardiovascular: normal precordial activity, RRR, no murmur/rubs/gallops Respiratory:  adequate breathing efforts, no gross chest deformity, Clear to auscultation bilaterally Gastrointestinal: abdomen soft, non-tender, no distension, bowel sounds  present Musculoskeletal: no gross deformities, strength intact in all four extremities Skin: moist, warm, no rashes Neurological: + slight tremor with outstretched hand on right only, deep tendon reflexes absent in BLE.   CMP ( most recent) CMP     Component Value Date/Time   NA 138 04/28/2023 0938   K 4.1 04/28/2023 0938   CL 101 04/28/2023 0938   CO2 22 04/28/2023 0938   GLUCOSE 92 04/28/2023 0938   GLUCOSE 108 (H) 03/14/2023 0910   BUN 4 (L) 04/28/2023 0938   CREATININE 0.78 04/28/2023 0938   CREATININE 0.73 03/14/2023 0910   CALCIUM 9.4 04/28/2023 0938   PROT 7.3 04/28/2023 0938   ALBUMIN 4.4 04/28/2023 0938   AST 18 04/28/2023 0938   ALT 16 04/28/2023 0938   ALKPHOS 85 04/28/2023 0938   BILITOT <0.2 04/28/2023 0938   EGFR 103 04/28/2023 0938   GFRNONAA >60 03/01/2023 0558   GFRNONAA 105 10/04/2020 0000     Diabetic Labs (most recent): Lab Results  Component Value Date   HGBA1C 5.4 04/28/2023   HGBA1C 5.3 09/23/2022   HGBA1C 5.7 (H) 04/26/2022     Lipid Panel ( most recent) Lipid Panel     Component Value Date/Time   CHOL 240 (H) 04/28/2023 0938   TRIG 126 04/28/2023 0938   HDL 47 04/28/2023 0938   CHOLHDL  5.1 (H) 04/28/2023 0938   CHOLHDL 7.0 (H) 01/11/2020 1154   LDLCALC 170 (H) 04/28/2023 0938   LDLCALC 166 (H) 01/11/2020 1154   LABVLDL 23 04/28/2023 0938       Lab Results  Component Value Date   TSH 8.230 (H) 04/28/2023   TSH 6.470 (H) 03/10/2023   TSH 8.570 (H) 01/23/2023   TSH 8.360 (H) 09/23/2022   TSH 2.530 04/26/2022   TSH 3.390 10/19/2021   TSH 14.800 (H) 08/27/2021   TSH 1.200 06/25/2021   TSH 1.040 06/19/2021   TSH 19.900 (H) 05/14/2021   FREET4 1.02 04/28/2023   FREET4 0.94 03/10/2023   FREET4 1.25 01/23/2023   FREET4 0.70 (L) 09/23/2022   FREET4 1.13 04/26/2022   FREET4 0.94 10/19/2021   FREET4 0.72 (L) 06/25/2021   FREET4 0.9 10/04/2020   FREET4 0.7 (L) 07/06/2020   FREET4 0.7 (L) 04/04/2020      Assessment & Plan:    ASSESSMENT / PLAN:  1. Hypothyroidism- due to Hashimoto's thyroiditis  Patient with long-standing hypothyroidism, on levothyroxine therapy. On physical exam, patient does not have gross goiter, thyroid nodules, or neck compression symptoms.  Positive TPO antibodies confirm autoimmune etiology.  -She is advised to continue her Levothyroxine 125 mcg po daily before breakfast but to start separating it out from all other medications by at least 30 minutes to allow for proper absorption.    - We discussed about correct intake of levothyroxine, at fasting, with water, separated by at least 30 minutes from breakfast, and separated by more than 4 hours from calcium, iron, multivitamins, acid reflux medications (PPIs). -Patient is made aware of the fact that thyroid hormone replacement is needed for life, dose to be adjusted by periodic monitoring of thyroid function tests.  - Will check thyroid tests before next visit: TSH, free T4 and adjust dose accordingly.  -Will order thyroid ultrasound to assess baseline anatomy.   - Time spent with the patient: 60 minutes, of which >50% was spent in obtaining information about her symptoms, reviewing her previous labs, evaluations, and treatments, counseling her about her hypothyroidism, and developing a plan to confirm the diagnosis and long term treatment as necessary. Please refer to "Patient Self Inventory" in the Media tab for reviewed elements of pertinent patient history.  [name] participated in the discussions, expressed understanding, and voiced agreement with the above plans.  All questions were answered to her satisfaction. she is encouraged to contact clinic should she have any questions or concerns prior to her return visit.   FOLLOW UP PLAN:  Return in about 8 weeks (around 08/19/2023) for Thyroid follow up, Previsit labs, thyroid ultrasound.  Ronny Bacon, [facility] [address] [place], [place] [address] Phone: [phone] Fax: [phone]  06/24/2023, 10:20 AM

## 2023-07-01 DIAGNOSIS — F329 Major depressive disorder, single episode, unspecified: Secondary | ICD-10-CM | POA: Diagnosis not present

## 2023-07-01 DIAGNOSIS — F909 Attention-deficit hyperactivity disorder, unspecified type: Secondary | ICD-10-CM | POA: Diagnosis not present

## 2023-07-01 DIAGNOSIS — F411 Generalized anxiety disorder: Secondary | ICD-10-CM | POA: Diagnosis not present

## 2023-07-08 DIAGNOSIS — F439 Reaction to severe stress, unspecified: Secondary | ICD-10-CM | POA: Diagnosis not present

## 2023-07-08 DIAGNOSIS — F329 Major depressive disorder, single episode, unspecified: Secondary | ICD-10-CM | POA: Diagnosis not present

## 2023-07-08 DIAGNOSIS — F988 Other specified behavioral and emotional disorders with onset usually occurring in childhood and adolescence: Secondary | ICD-10-CM | POA: Diagnosis not present

## 2023-07-24 DIAGNOSIS — F439 Reaction to severe stress, unspecified: Secondary | ICD-10-CM | POA: Diagnosis not present

## 2023-07-24 DIAGNOSIS — F988 Other specified behavioral and emotional disorders with onset usually occurring in childhood and adolescence: Secondary | ICD-10-CM | POA: Diagnosis not present

## 2023-07-24 DIAGNOSIS — F329 Major depressive disorder, single episode, unspecified: Secondary | ICD-10-CM | POA: Diagnosis not present

## 2023-07-25 ENCOUNTER — Inpatient Hospital Stay: Payer: Medicaid Other | Attending: Hematology

## 2023-07-25 DIAGNOSIS — D509 Iron deficiency anemia, unspecified: Secondary | ICD-10-CM | POA: Diagnosis present

## 2023-07-25 DIAGNOSIS — D75839 Thrombocytosis, unspecified: Secondary | ICD-10-CM

## 2023-07-25 DIAGNOSIS — D500 Iron deficiency anemia secondary to blood loss (chronic): Secondary | ICD-10-CM

## 2023-07-25 LAB — CBC WITH DIFFERENTIAL/PLATELET
Abs Immature Granulocytes: 0.03 10*3/uL (ref 0.00–0.07)
Basophils Absolute: 0.1 10*3/uL (ref 0.0–0.1)
Basophils Relative: 1 %
Eosinophils Absolute: 0.2 10*3/uL (ref 0.0–0.5)
Eosinophils Relative: 2 %
HCT: 43.7 % (ref 36.0–46.0)
Hemoglobin: 15.1 g/dL — ABNORMAL HIGH (ref 12.0–15.0)
Immature Granulocytes: 0 %
Lymphocytes Relative: 27 %
Lymphs Abs: 2.5 10*3/uL (ref 0.7–4.0)
MCH: 29.8 pg (ref 26.0–34.0)
MCHC: 34.6 g/dL (ref 30.0–36.0)
MCV: 86.2 fL (ref 80.0–100.0)
Monocytes Absolute: 0.5 10*3/uL (ref 0.1–1.0)
Monocytes Relative: 6 %
Neutro Abs: 5.9 10*3/uL (ref 1.7–7.7)
Neutrophils Relative %: 64 %
Platelets: 405 10*3/uL — ABNORMAL HIGH (ref 150–400)
RBC: 5.07 MIL/uL (ref 3.87–5.11)
RDW: 13.2 % (ref 11.5–15.5)
WBC: 9.2 10*3/uL (ref 4.0–10.5)
nRBC: 0 % (ref 0.0–0.2)

## 2023-07-25 LAB — IRON AND TIBC
Iron: 70 ug/dL (ref 28–170)
Saturation Ratios: 25 % (ref 10.4–31.8)
TIBC: 284 ug/dL (ref 250–450)
UIBC: 214 ug/dL

## 2023-07-25 LAB — FERRITIN: Ferritin: 99 ng/mL (ref 11–307)

## 2023-07-26 NOTE — Progress Notes (Unsigned)
 [facility] [address], [place] [address]   CLINIC:  Medical Oncology/Hematology  PCP:  Gilmore Laroche, FNP [address] 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  She was last evaluated via telemedicine visit by Rojelio Brenner PA-C on 04/23/2023.  She received IV Feraheme x 2 on 05/02/2023 - 05/12/2023.  At today's visit, she reports feeling*** fatigued with some mild dyspnea on exertion and lightheadedness.  *** *** She denies any headaches, racing heartbeat, and presyncopal episodes. *** She has aquagenic pruritus and Raynaud's phenomenon. *** No history of DVT or PE. *** She does note bilateral leg swelling, left more than right, that has been ongoing for several years and reportedly began around the time that she broke her left ankle; swelling worsens throughout the day as she spends time on her feet, but improves overnight although it does not completely resolve. *** She continues to vape daily.   She no longer has periods, as she is s/p total abdominal hysterectomy in March 2024.  *** *** She has occasional scant rectal bleeding associated with constipation. *** No gross hematochezia or melena. *** She is taking iron tablet daily.   She has 50***% energy and 100***% appetite. She endorses that she is maintaining a stable weight.  ASSESSMENT & PLAN:  1.   Thrombocytosis - Progressively elevated platelets since February 2022. Labs from 05/24/2022 show platelets 607 in the context of borderline anemia (Hgb 10.4) and borderline microcytosis (MCV 80). Iron studies (05/06/2022) show iron deficiency with ferritin 18 and iron saturation 5%. - She vapes daily. *** She is morbidly obese.***  She is being worked up  by rheumatology for possible autoimmune disease (thoracic spine pain, history of previous ANA positive in 2021). - JAK2 with reflex to CALR, MPL, and E12-15 was negative.  Hematology workup showed negative ANA and rheumatoid factor, but with elevated ESR and CRP. - No history of DVT or PE. - No B symptoms.  She reports aquagenic pruritus and Raynaud's phenomenon.*** - Most recent labs (07/25/2023): Platelets 405, Hgb 15.1/hematocrit 43.7%.  Iron stores are normal. - DIFFERENTIAL DIAGNOSIS favors reactive thrombocytosis in the setting of iron deficiency, vaping, obesity, and possible autoimmune/inflammatory disease. - PLAN: No evidence of MPN or clonal thrombocytosis at this time.  Continue monitoring with periodic CBCs.  Continue treatment of iron deficiency as below.  Continue to encourage the patient to stop vaping.   2.  Iron deficiency anemia - Total abdominal hysterectomy in March 2024 due to severe abnormal uterine bleeding - Severe anemia in December 2023 with Hgb down to 7.7. - EGD (02/17/2023): Normal esophagus, gastritis.  Scalloped mucosa in duodenum (pathology benign with no significant pathologic changes). - Colonoscopy (02/17/2023): Polyps x 3 (hyperplastic polyps, no dysplasia or malignancy).  Nonbleeding external and internal hemorrhoids. - Intermittent hemorrhoid bleeding *** - Previously donated blood regularly, no blood donation for the past year.*** - No prior history of blood transfusion. - Taking iron tablet daily since November 2023.*** Most recent IV iron with Feraheme x 2 in December 2024 - Most recent labs (07/25/2023): Hgb 15.1/MCV 86.2, ferritin 99, iron saturation 25% - PLAN: No indication for IV iron at this time. - Continue daily iron supplement - Repeat CBC/D, ferritin, iron/TIBC in 4 months followed by OFFICE  visit   3.  Family history - Strong family history of breast, uterine, and ovarian cancer on her mother's side of the family: Mother had endometrial  cancer Maternal aunt with breast cancer Maternal aunt with ovarian cancer and breast cancer Maternal grandmother with breast cancer and uterine cancer.  - PLAN: Patient was referred to genetic counselor, but did not show up for appointment.       4.  Other history - PMH: Abnormal uterine bleeding, ADHD, PCOS, degenerative disc disease, hypertension, hypothyroidism, asthma, morbid obesity, anxiety/depression - SOCIAL: She lives at home with her husband and daughter.  She is currently unemployed.  She is a former smoker, switched to vaping 2 years ago and vapes frequently throughout the day.  She drinks occasional alcohol.  She denies any illicit drug use. - FAMILY: Strong family history of breast, uterine, and ovarian cancer on mother side of the family, as noted above.   PLAN SUMMARY: >> Labs in 4 months = CBC/D, ferritin, iron/TIBC >> OFFICE visit in 4 months      REVIEW OF SYSTEMS: ***  Review of Systems - Oncology   PHYSICAL EXAM:  ECOG PERFORMANCE STATUS: {CHL ONC ECOG UE:[id number]} *** There were no vitals filed for this visit. There were no vitals filed for this visit. Physical Exam  PAST MEDICAL/SURGICAL HISTORY:  Past Medical History:  Diagnosis Date   Abdominal pain    ADHD    ANA positive    Back pain    DDD (degenerative disc disease), lumbar    Eye pain, right    Fatigue    Frontal sinusitis    Hypertension    Hypothyroidism    Iron deficiency anemia due to chronic blood loss 06/19/2022   Medical history non-contributory    Obesity, morbid (HCC)    Palpitations    Rheumatoid arthritis [facility])    Past Surgical History:  Procedure Laterality Date   ABLATION     05/22/2020 left side, 06/14/2020 right side    BIOPSY  02/17/2023   Procedure: BIOPSY;  Surgeon: Franky Macho, MD;  Location: [facility];  Service: Endoscopy;;   CHOLECYSTECTOMY     COLONOSCOPY WITH PROPOFOL N/A 02/17/2023   Procedure: COLONOSCOPY WITH PROPOFOL;  Surgeon: Franky Macho,  MD;  Location: [facility];  Service: Endoscopy;  Laterality: N/A;  9:45am;asa 2   ESOPHAGOGASTRODUODENOSCOPY (EGD) WITH PROPOFOL N/A 02/17/2023   Procedure: ESOPHAGOGASTRODUODENOSCOPY (EGD) WITH PROPOFOL;  Surgeon: Franky Macho, MD;  Location: [facility];  Service: Endoscopy;  Laterality: N/A;  9:45am;asa 2   HYSTERECTOMY ABDOMINAL WITH SALPINGECTOMY     POLYPECTOMY  02/17/2023   Procedure: POLYPECTOMY;  Surgeon: Franky Macho, MD;  Location: [facility];  Service: Endoscopy;;   ROBOTIC ASSISTED TOTAL HYSTERECTOMY Bilateral 07/30/2022   Procedure: XI ROBOTIC ASSISTED TOTAL HYSTERECTOMY AND BILATERAL SALPINGECTOMY;  Surgeon: Lazaro Arms, MD;  Location: [facility];  Service: Gynecology;  Laterality: Bilateral;    SOCIAL HISTORY:  Social History   Socioeconomic History   Marital status: Married    Spouse name: Not on file   Number of children: 1   Years of education: Not on file   Highest education level: Associate degree: academic program  Occupational History   Not on file  Tobacco Use   Smoking status: Former    Current packs/day: 0.00    Average packs/day: 1 pack/day for 15.0 years (15.0 ttl pk-yrs)    Types: Cigarettes, E-cigarettes    Start date: 11/24/2005  Quit date: 11/24/2020    Years since quitting: 2.6    Passive exposure: Current   Smokeless tobacco: Never   Tobacco comments:    She has quit of and on.  Vaping Use   Vaping status: Some Days  Substance and Sexual Activity   Alcohol use: Yes    Comment: occasionally /social events   Drug use: Not Currently    Comment: smoked weed during teen years   Sexual activity: Not Currently    Birth control/protection: Surgical    Comment: hyst  Other Topics Concern   Not on file  Social History Narrative   Married for May 2021.Lives with husband and daughter.Homemaker.Husband works at [employer].   Social Drivers of Corporate investment banker Strain: Low Risk  (09/23/2022)   Overall Financial  Resource Strain (CARDIA)    Difficulty of Paying Living Expenses: Not very hard  Food Insecurity: Low Risk  (03/01/2023)   Received from [facility]   Hunger Vital Sign    Worried About Running Out of Food in the Last Year: Never true    Ran Out of Food in the Last Year: Never true  Transportation Needs: No Transportation Needs (03/01/2023)   Received from [facility]    In the past 12 months, has lack of reliable transportation kept you from medical appointments, meetings, work or from getting things needed for daily living? : No  Physical Activity: Unknown (09/23/2022)   Exercise Vital Sign    Days of Exercise per Week: Patient declined    Minutes of Exercise per Session: Not on file  Stress: Stress Concern Present (09/23/2022)   [employer] of Occupational Health - Occupational Stress Questionnaire    Feeling of Stress : Very much  Social Connections: Moderately Isolated (09/23/2022)   Social Connection and Isolation Panel [NHANES]    Frequency of Communication with Friends and Family: More than three times a week    Frequency of Social Gatherings with Friends and Family: Twice a week    Attends Religious Services: Never    Database administrator or Organizations: No    Attends Engineer, structural: Not on file    Marital Status: Married  Catering manager Violence: Not At Risk (02/27/2023)   Humiliation, Afraid, Rape, and Kick questionnaire    Fear of Current or Ex-Partner: No    Emotionally Abused: No    Physically Abused: No    Sexually Abused: No    FAMILY HISTORY:  Family History  Problem Relation Age of Onset   Cervical cancer Maternal Grandmother    Breast cancer Maternal Grandmother    Skin cancer Maternal Grandmother    Heart attack Maternal Grandfather    Diabetes Father    Heart attack Father    Hyperlipidemia Mother    Peripheral Artery Disease Mother    ADD / ADHD Mother    Bipolar disorder Mother    Cervical cancer Mother         at age 32.   ADD / ADHD Sister    Healthy Daughter    Breast cancer Maternal Aunt    Cervical cancer Maternal Aunt    ADD / ADHD Maternal Aunt    Uterine cancer Maternal Aunt    Breast cancer Maternal Aunt    Multiple sclerosis Paternal Aunt    Colon cancer Neg Hx     CURRENT MEDICATIONS:  Outpatient Encounter Medications as of 07/28/2023  Medication Sig   acetaminophen (TYLENOL) 500  MG tablet Take 2 tablets (1,000 mg total) by mouth every 6 (six) hours.   albuterol (VENTOLIN HFA) 108 (90 Base) MCG/ACT inhaler Inhale 2 puffs into the lungs every 6 (six) hours as needed for wheezing or shortness of breath.   amphetamine-dextroamphetamine (ADDERALL XR) 20 MG 24 hr capsule Take 20 mg by mouth daily.   amphetamine-dextroamphetamine (ADDERALL) 5 MG tablet Take 5 mg by mouth daily in the afternoon.   FLUoxetine (PROZAC) 20 MG capsule Take 20 mg by mouth daily.   Fremanezumab-vfrm (AJOVY) 225 MG/1.5ML SOAJ Inject 225 mg into the skin every 30 (thirty) days.   levocetirizine (XYZAL) 5 MG tablet Take 1 tablet (5 mg total) by mouth daily.   levothyroxine (SYNTHROID) 125 MCG tablet Take 1 tablet (125 mcg total) by mouth daily.   olmesartan-hydrochlorothiazide (BENICAR HCT) 20-12.5 MG tablet TAKE 1 TABLET BY MOUTH DAILY   tiZANidine (ZANAFLEX) 4 MG tablet Take 1 tablet (4 mg total) by mouth at bedtime.   triamcinolone ointment (KENALOG) 0.5 % Apply 1 Application topically 2 (two) times daily as needed.   Vitamin D, Ergocalciferol, (DRISDOL) 1.25 MG (50000 UNIT) CAPS capsule Take 1 capsule (50,000 Units total) by mouth every 7 (seven) days.   No facility-administered encounter medications on file as of 07/28/2023.    ALLERGIES:  Allergies  Allergen Reactions   Poison Ivy Extract Anaphylaxis   Poison Oak Extract Anaphylaxis   Anser Anser Feather (Goose) Allergy Skin Test    Flexeril [Cyclobenzaprine] Hives   Lactose Intolerance (Gi) Diarrhea   Honeysuckle Flower [Lonicera] Rash   Mixed  Grasses Rash    LABORATORY DATA:  I have reviewed the labs as listed.  CBC    Component Value Date/Time   WBC 9.2 07/25/2023 0940   RBC 5.07 07/25/2023 0940   HGB 15.1 (H) 07/25/2023 0940   HGB 13.9 04/28/2023 0938   HCT 43.7 07/25/2023 0940   HCT 42.7 04/28/2023 0938   PLT 405 (H) 07/25/2023 0940   PLT 464 (H) 04/28/2023 0938   MCV 86.2 07/25/2023 0940   MCV 91 04/28/2023 0938   MCH 29.8 07/25/2023 0940   MCHC 34.6 07/25/2023 0940   RDW 13.2 07/25/2023 0940   RDW 12.8 04/28/2023 0938   LYMPHSABS 2.5 07/25/2023 0940   LYMPHSABS 3.0 04/28/2023 0938   MONOABS 0.5 07/25/2023 0940   EOSABS 0.2 07/25/2023 0940   EOSABS 0.2 04/28/2023 0938   BASOSABS 0.1 07/25/2023 0940   BASOSABS 0.1 04/28/2023 0938      Latest Ref Rng & Units 04/28/2023    9:38 AM 03/14/2023    9:10 AM 03/10/2023    8:43 AM  CMP  Glucose 70 - 99 mg/dL 92  130  93   BUN 6 - 20 mg/dL 4  10  5    Creatinine 0.57 - 1.00 mg/dL 8.65  7.84  6.96   Sodium 134 - 144 mmol/L 138  135  139   Potassium 3.5 - 5.2 mmol/L 4.1  4.0  3.9   Chloride 96 - 106 mmol/L 101  99  101   CO2 20 - 29 mmol/L 22  24  22    Calcium 8.7 - 10.2 mg/dL 9.4  9.8  9.3   Total Protein 6.0 - 8.5 g/dL 7.3  8.0    Total Bilirubin 0.0 - 1.2 mg/dL <2.9  0.6    Alkaline Phos 44 - 121 IU/L 85     AST 0 - 40 IU/L 18  24    ALT 0 - 32  IU/L 16  55      DIAGNOSTIC IMAGING:  I have independently reviewed the relevant imaging and discussed with the patient.   WRAP UP:  All questions were answered. The patient knows to call the clinic with any problems, questions or concerns.  Medical decision making: ***  Time spent on visit: I spent *** minutes counseling the patient face to face. The total time spent in the appointment was *** minutes and more than 50% was on counseling.  Carnella Guadalajara, PA-C  ***

## 2023-07-28 ENCOUNTER — Inpatient Hospital Stay: Payer: Medicaid Other | Attending: Hematology | Admitting: Physician Assistant

## 2023-07-28 VITALS — BP 149/90 | HR 78 | Temp 99.2°F | Resp 18 | Wt 282.0 lb

## 2023-07-28 DIAGNOSIS — D500 Iron deficiency anemia secondary to blood loss (chronic): Secondary | ICD-10-CM | POA: Diagnosis not present

## 2023-07-28 DIAGNOSIS — D509 Iron deficiency anemia, unspecified: Secondary | ICD-10-CM | POA: Insufficient documentation

## 2023-07-28 DIAGNOSIS — D75839 Thrombocytosis, unspecified: Secondary | ICD-10-CM | POA: Diagnosis not present

## 2023-07-28 DIAGNOSIS — Z87891 Personal history of nicotine dependence: Secondary | ICD-10-CM | POA: Diagnosis not present

## 2023-07-28 DIAGNOSIS — D75838 Other thrombocytosis: Secondary | ICD-10-CM | POA: Insufficient documentation

## 2023-07-28 NOTE — Patient Instructions (Signed)
 [facility] **VISIT SUMMARY & IMPORTANT INSTRUCTIONS **   You were seen today by Rojelio Brenner PA-C for your follow-up visit.    ELEVATED PLATELETS Your platelets remain mildly elevated, which is likely reactive due to iron deficiency, obesity, and vaping.  IRON DEFICIENCY Your iron levels look good today! Continue to take your iron supplement (325 mg ferrous sulfate every other day).  FOLLOW-UP APPOINTMENT: Labs and office visit in 4 months  ** Thank you for trusting me with your healthcare!  I strive to provide all of my patients with quality care at each visit.  If you receive a survey for this visit, I would be so grateful to you for taking the time to provide feedback.  Thank you in advance!  ~ Naomia Lenderman                   Dr. Doreatha Massed   &   Rojelio Brenner, PA-C   - - - - - - - - - - - - - - - - - -    Thank you for choosing  [facility] at [facility] to provide your oncology and hematology care.  To afford each patient quality time with our provider, please arrive at least 15 minutes before your scheduled appointment time.   If you have a lab appointment with the Cancer Center please come in thru the Main Entrance and check in at the main information desk.  You need to re-schedule your appointment should you arrive 10 or more minutes late.  We strive to give you quality time with our providers, and arriving late affects you and other patients whose appointments are after yours.  Also, if you no show three or more times for appointments you may be dismissed from the clinic at the providers discretion.     Again, thank you for choosing [facility]  Our hope is that these requests will decrease the amount of time that you wait before being seen by our physicians.       _____________________________________________________________  Should you have questions after your visit to [facility], please contact our office at [phone] and follow the prompts.  Our office hours are 8:00 a.m. and 4:30 p.m. Monday - Friday.  Please note that voicemails left after 4:00 p.m. may not be returned until the following business day.  We are closed weekends and major holidays.  You do have access to a nurse 24-7, just call the main number to the clinic [phone] and do not press any options, hold on the line and a nurse will answer the phone.    For prescription refill requests, have your pharmacy contact our office and allow 72 hours.

## 2023-07-31 ENCOUNTER — Ambulatory Visit (HOSPITAL_COMMUNITY)
Admission: RE | Admit: 2023-07-31 | Discharge: 2023-07-31 | Disposition: A | Discharge status: Home / Self Care | Payer: Medicaid Other | Source: Ambulatory Visit | Attending: Nurse Practitioner | Admitting: Nurse Practitioner

## 2023-07-31 DIAGNOSIS — E063 Autoimmune thyroiditis: Secondary | ICD-10-CM | POA: Diagnosis not present

## 2023-07-31 DIAGNOSIS — E042 Nontoxic multinodular goiter: Secondary | ICD-10-CM | POA: Diagnosis not present

## 2023-08-06 ENCOUNTER — Encounter: Payer: Self-pay | Admitting: Nurse Practitioner

## 2023-08-06 ENCOUNTER — Telehealth: Payer: Self-pay | Admitting: *Deleted

## 2023-08-06 NOTE — Telephone Encounter (Signed)
 Noted that Kristin Baker has sent the patient a message going over her results.

## 2023-08-06 NOTE — Progress Notes (Signed)
 FYI: I sent mychart message going over thyroid ultrasound results.

## 2023-08-06 NOTE — Telephone Encounter (Signed)
-----   Message from Dani Gobble sent at 08/06/2023  1:06 PM EDT ----- Kristin Baker: I sent mychart message going over thyroid ultrasound results.

## 2023-08-13 DIAGNOSIS — E063 Autoimmune thyroiditis: Secondary | ICD-10-CM | POA: Diagnosis not present

## 2023-08-14 LAB — T4, FREE: Free T4: 1.13 ng/dL (ref 0.82–1.77)

## 2023-08-14 LAB — TSH: TSH: 1.16 u[IU]/mL (ref 0.450–4.500)

## 2023-08-20 ENCOUNTER — Encounter: Payer: Self-pay | Admitting: Nurse Practitioner

## 2023-08-20 ENCOUNTER — Ambulatory Visit: Payer: Medicaid Other | Admitting: Nurse Practitioner

## 2023-08-20 VITALS — BP 118/76 | HR 78 | Ht 67.0 in | Wt 281.2 lb

## 2023-08-20 DIAGNOSIS — E063 Autoimmune thyroiditis: Secondary | ICD-10-CM | POA: Diagnosis not present

## 2023-08-20 NOTE — Progress Notes (Signed)
 Endocrinology Follow Up Note                                         08/20/2023, 9:49 AM  Subjective:   Subjective    Kristin Baker is a 33 y.o.-year-old female patient being seen in follow up after being seen in consultation for hypothyroidism referred by Gilmore Laroche, FNP.   Past Medical History:  Diagnosis Date   Abdominal pain    ADHD    ANA positive    Back pain    DDD (degenerative disc disease), lumbar    Eye pain, right    Fatigue    Frontal sinusitis    Hypertension    Hypothyroidism    Iron deficiency anemia due to chronic blood loss 06/19/2022   Medical history non-contributory    Obesity, morbid (HCC)    Palpitations    Rheumatoid arthritis [facility])     Past Surgical History:  Procedure Laterality Date   ABLATION     05/22/2020 left side, 06/14/2020 right side    BIOPSY  02/17/2023   Procedure: BIOPSY;  Surgeon: Franky Macho, MD;  Location: [facility];  Service: Endoscopy;;   CHOLECYSTECTOMY     COLONOSCOPY WITH PROPOFOL N/A 02/17/2023   Procedure: COLONOSCOPY WITH PROPOFOL;  Surgeon: Franky Macho, MD;  Location: [facility];  Service: Endoscopy;  Laterality: N/A;  9:45am;asa 2   ESOPHAGOGASTRODUODENOSCOPY (EGD) WITH PROPOFOL N/A 02/17/2023   Procedure: ESOPHAGOGASTRODUODENOSCOPY (EGD) WITH PROPOFOL;  Surgeon: Franky Macho, MD;  Location: [facility];  Service: Endoscopy;  Laterality: N/A;  9:45am;asa 2   HYSTERECTOMY ABDOMINAL WITH SALPINGECTOMY     POLYPECTOMY  02/17/2023   Procedure: POLYPECTOMY;  Surgeon: Franky Macho, MD;  Location: [facility];  Service: Endoscopy;;   ROBOTIC ASSISTED TOTAL HYSTERECTOMY Bilateral 07/30/2022   Procedure: XI ROBOTIC ASSISTED TOTAL HYSTERECTOMY AND BILATERAL SALPINGECTOMY;  Surgeon: Lazaro Arms, MD;  Location: [facility];  Service: Gynecology;  Laterality: Bilateral;    Social History   Socioeconomic History   Marital  status: Married    Spouse name: Not on file   Number of children: 1   Years of education: Not on file   Highest education level: Associate degree: academic program  Occupational History   Not on file  Tobacco Use   Smoking status: Former    Current packs/day: 0.00    Average packs/day: 1 pack/day for 15.0 years (15.0 ttl pk-yrs)    Types: Cigarettes, E-cigarettes    Start date: 11/24/2005    Quit date: 11/24/2020    Years since quitting: 2.7    Passive exposure: Current   Smokeless tobacco: Never   Tobacco comments:    She has quit of and on.  Vaping Use   Vaping status: Some Days  Substance and Sexual Activity   Alcohol use: Yes    Comment: occasionally /social events   Drug use: Not Currently    Comment: smoked weed during teen years   Sexual activity: Not  Currently    Birth control/protection: Surgical    Comment: hyst  Other Topics Concern   Not on file  Social History Narrative   Married for May 2021.Lives with husband and daughter.Homemaker.Husband works at [employer].   Social Drivers of Corporate investment banker Strain: Low Risk  (09/23/2022)   Overall Financial Resource Strain (CARDIA)    Difficulty of Paying Living Expenses: Not very hard  Food Insecurity: Low Risk  (03/01/2023)   Received from [facility]   Hunger Vital Sign    Worried About Running Out of Food in the Last Year: Never true    Ran Out of Food in the Last Year: Never true  Transportation Needs: No Transportation Needs (03/01/2023)   Received from [facility]    In the past 12 months, has lack of reliable transportation kept you from medical appointments, meetings, work or from getting things needed for daily living? : No  Physical Activity: Unknown (09/23/2022)   Exercise Vital Sign    Days of Exercise per Week: Patient declined    Minutes of Exercise per Session: Not on file  Stress: Stress Concern Present (09/23/2022)   [employer] of Occupational Health -  Occupational Stress Questionnaire    Feeling of Stress : Very much  Social Connections: Moderately Isolated (09/23/2022)   Social Connection and Isolation Panel [NHANES]    Frequency of Communication with Friends and Family: More than three times a week    Frequency of Social Gatherings with Friends and Family: Twice a week    Attends Religious Services: Never    Database administrator or Organizations: No    Attends Engineer, structural: Not on file    Marital Status: Married    Family History  Problem Relation Age of Onset   Cervical cancer Maternal Grandmother    Breast cancer Maternal Grandmother    Skin cancer Maternal Grandmother    Heart attack Maternal Grandfather    Diabetes Father    Heart attack Father    Hyperlipidemia Mother    Peripheral Artery Disease Mother    ADD / ADHD Mother    Bipolar disorder Mother    Cervical cancer Mother        at age 34.   ADD / ADHD Sister    Healthy Daughter    Breast cancer Maternal Aunt    Cervical cancer Maternal Aunt    ADD / ADHD Maternal Aunt    Uterine cancer Maternal Aunt    Breast cancer Maternal Aunt    Multiple sclerosis Paternal Aunt    Colon cancer Neg Hx     Outpatient Encounter Medications as of 08/20/2023  Medication Sig   acetaminophen (TYLENOL) 500 MG tablet Take 2 tablets (1,000 mg total) by mouth every 6 (six) hours.   albuterol (VENTOLIN HFA) 108 (90 Base) MCG/ACT inhaler Inhale 2 puffs into the lungs every 6 (six) hours as needed for wheezing or shortness of breath.   amphetamine-dextroamphetamine (ADDERALL XR) 20 MG 24 hr capsule Take 20 mg by mouth daily.   amphetamine-dextroamphetamine (ADDERALL) 5 MG tablet Take 5 mg by mouth daily in the afternoon.   FLUoxetine (PROZAC) 20 MG capsule Take 20 mg by mouth daily.   Fremanezumab-vfrm (AJOVY) 225 MG/1.5ML SOAJ Inject 225 mg into the skin every 30 (thirty) days.   levocetirizine (XYZAL) 5 MG tablet Take 1 tablet (5 mg total) by mouth daily.    levothyroxine (SYNTHROID) 125 MCG tablet Take 1  tablet (125 mcg total) by mouth daily.   mirtazapine (REMERON) 7.5 MG tablet 1 tablet at bedtime Orally Once a day for 30 days   olmesartan-hydrochlorothiazide (BENICAR HCT) 20-12.5 MG tablet TAKE 1 TABLET BY MOUTH DAILY   tiZANidine (ZANAFLEX) 4 MG tablet Take 1 tablet (4 mg total) by mouth at bedtime.   Vitamin D, Ergocalciferol, (DRISDOL) 1.25 MG (50000 UNIT) CAPS capsule Take 1 capsule (50,000 Units total) by mouth every 7 (seven) days.   No facility-administered encounter medications on file as of 08/20/2023.    ALLERGIES: Allergies  Allergen Reactions   Poison Ivy Extract Anaphylaxis   Poison Oak Extract Anaphylaxis   Anser Anser Feather (Goose) Allergy Skin Test    Flexeril [Cyclobenzaprine] Hives   Lactose Intolerance (Gi) Diarrhea   Honeysuckle Flower [Lonicera] Rash   Mixed Grasses Rash   VACCINATION STATUS: Immunization History  Administered Date(s) Administered   Moderna Sars-Covid-2 Vaccination 10/08/2019, 11/05/2019   Tdap 08/31/2020     HPI   Mauricia Mertens  is a patient with the above medical history. she was diagnosed with hypothyroidism at approximate age of 39 years, which required subsequent initiation of thyroid hormone replacement therapy. She was first diagnosed when she was with Dr. Karilyn Cota (or his NP Jiles Prows).  After his passing, her care was transferred to her current PCP.  she was given various doses of Levothyroxine over the years, currently on 125 micrograms.  This has been changed several times by her PCP recently. she reports compliance to this medication:  but she does note she takes it with her BP and allergy pill in the morning.  She does take iron supplement, but takes that at night.  She notes her PCP told her it was ok to take all her medications together.  I reviewed patient's thyroid tests:  Lab Results  Component Value Date   TSH 1.160 08/13/2023   TSH 8.230 (H) 04/28/2023   TSH 6.470 (H)  03/10/2023   TSH 8.570 (H) 01/23/2023   TSH 8.360 (H) 09/23/2022   TSH 2.530 04/26/2022   TSH 3.390 10/19/2021   TSH 14.800 (H) 08/27/2021   TSH 1.200 06/25/2021   TSH 1.040 06/19/2021   FREET4 1.13 08/13/2023   FREET4 1.02 04/28/2023   FREET4 0.94 03/10/2023   FREET4 1.25 01/23/2023   FREET4 0.70 (L) 09/23/2022   FREET4 1.13 04/26/2022   FREET4 0.94 10/19/2021   FREET4 0.72 (L) 06/25/2021   FREET4 0.9 10/04/2020   FREET4 0.7 (L) 07/06/2020    Pt denies feeling nodules in neck, hoarseness, SOB with lying down.  she does have extensive family history of autoimmune disorders and has family history of thyroid disorders in her uncle (total thyroidectomy for unknown reason), grandmother (? Hypothyroidism) and mom (recently diagnosed with thyroid issues).  She did report her uncle has history of thyroid cancer (requiring total thyroidectomy).  No history of radiation therapy to head or neck.  No recent use of iodine supplements.  Denies use of Biotin containing supplements.  I reviewed her chart and she also has a history of migraines, HTN, constipation, depression/anxiety, HLD, anemia, and vitamin d deficiency.  Review of systems  Constitutional: + decreasing body weight,  current Body mass index is 44.04 kg/m. , + fluctuating fatigue, + subjective hyperthermia (mostly at night), no subjective hypothermia Eyes: no blurry vision, no xerophthalmia ENT: no sore throat, no nodules palpated in throat, no dysphagia/odynophagia, no hoarseness Cardiovascular: no chest pain, no shortness of breath, no palpitations, no leg swelling Respiratory:  no cough, no shortness of breath Gastrointestinal: no nausea/vomiting/diarrhea Musculoskeletal: no muscle/joint aches Skin: no rashes, no hyperemia Neurological: no tremors, no numbness, no tingling, no dizziness Psychiatric: no depression, no anxiety   Objective:   Objective     BP 118/76 (BP Location: Left Arm, Patient Position: Sitting, Cuff  Size: Large)   Pulse 78   Ht 5\' 7"  (1.702 m)   Wt 281 lb 3.2 oz (127.6 kg)   LMP 03/10/2022 (Approximate)   BMI 44.04 kg/m  Wt Readings from Last 3 Encounters:  08/20/23 281 lb 3.2 oz (127.6 kg)  07/28/23 282 lb (127.9 kg)  06/24/23 289 lb 3.2 oz (131.2 kg)    BP Readings from Last 3 Encounters:  08/20/23 118/76  07/28/23 (!) 149/90  06/24/23 123/79      Physical Exam- Limited  Constitutional:  Body mass index is 44.04 kg/m. , not in acute distress, normal state of mind Eyes:  EOMI, no exophthalmos Musculoskeletal: no gross deformities, strength intact in all four extremities, no gross restriction of joint movements Skin:  no rashes, no hyperemia Neurological: no tremor with outstretched hands   CMP ( most recent) CMP     Component Value Date/Time   NA 138 04/28/2023 0938   K 4.1 04/28/2023 0938   CL 101 04/28/2023 0938   CO2 22 04/28/2023 0938   GLUCOSE 92 04/28/2023 0938   GLUCOSE 108 (H) 03/14/2023 0910   BUN 4 (L) 04/28/2023 0938   CREATININE 0.78 04/28/2023 0938   CREATININE 0.73 03/14/2023 0910   CALCIUM 9.4 04/28/2023 0938   PROT 7.3 04/28/2023 0938   ALBUMIN 4.4 04/28/2023 0938   AST 18 04/28/2023 0938   ALT 16 04/28/2023 0938   ALKPHOS 85 04/28/2023 0938   BILITOT <0.2 04/28/2023 0938   EGFR 103 04/28/2023 0938   GFRNONAA >60 03/01/2023 0558   GFRNONAA 105 10/04/2020 0000     Diabetic Labs (most recent): Lab Results  Component Value Date   HGBA1C 5.4 04/28/2023   HGBA1C 5.3 09/23/2022   HGBA1C 5.7 (H) 04/26/2022     Lipid Panel ( most recent) Lipid Panel     Component Value Date/Time   CHOL 240 (H) 04/28/2023 0938   TRIG 126 04/28/2023 0938   HDL 47 04/28/2023 0938   CHOLHDL 5.1 (H) 04/28/2023 0938   CHOLHDL 7.0 (H) 01/11/2020 1154   LDLCALC 170 (H) 04/28/2023 0938   LDLCALC 166 (H) 01/11/2020 1154   LABVLDL 23 04/28/2023 0938       Lab Results  Component Value Date   TSH 1.160 08/13/2023   TSH 8.230 (H) 04/28/2023   TSH  6.470 (H) 03/10/2023   TSH 8.570 (H) 01/23/2023   TSH 8.360 (H) 09/23/2022   TSH 2.530 04/26/2022   TSH 3.390 10/19/2021   TSH 14.800 (H) 08/27/2021   TSH 1.200 06/25/2021   TSH 1.040 06/19/2021   FREET4 1.13 08/13/2023   FREET4 1.02 04/28/2023   FREET4 0.94 03/10/2023   FREET4 1.25 01/23/2023   FREET4 0.70 (L) 09/23/2022   FREET4 1.13 04/26/2022   FREET4 0.94 10/19/2021   FREET4 0.72 (L) 06/25/2021   FREET4 0.9 10/04/2020   FREET4 0.7 (L) 07/06/2020    Thyroid US from 07/31/23 CLINICAL DATA:  Hashimoto's thyroiditis   EXAM: THYROID ULTRASOUND   TECHNIQUE: Ultrasound examination of the thyroid gland and adjacent soft tissues was performed.   COMPARISON:  None Available.   FINDINGS: Parenchymal Echotexture: Moderately heterogenous   Isthmus: 2 mm   Right lobe: 4.3 x  1.8 x 1.1 cm   Left lobe: 3.5 x 1.2 x 1.3 cm   _________________________________________________________   Estimated total number of nodules >/= 1 cm: 0   Number of spongiform nodules >/=  2 cm not described below (TR1): 0   Number of mixed cystic and solid nodules >/= 1.5 cm not described below (TR2): 0   _________________________________________________________   Moderate diffuse thyroid heterogeneity without hypervascularity compatible with chronic medical thyroid disease.   Nodule # 1:   Location: Left; Mid   Maximum size: 0.9 cm; Other 2 dimensions: 0.8 x 0.7 cm   Composition: solid/almost completely solid (2)   Echogenicity: isoechoic (1)   Shape: not taller-than-wide (0)   Margins: ill-defined (0)   Echogenic foci: none (0)   ACR TI-RADS total points: 3.   ACR TI-RADS risk category: TR3 (3 points).   ACR TI-RADS recommendations:   Given size (<1.4 cm) and appearance, this nodule does NOT meet TI-RADS criteria for biopsy or dedicated follow-up.   _________________________________________________________   Nodule # 2:   Location: Left; Mid   Maximum size: 0.9 cm; Other  2 dimensions: 0.8 x 0.8 cm   Composition: solid/almost completely solid (2)   Echogenicity: isoechoic (1)   Shape: not taller-than-wide (0)   Margins: ill-defined (0)   Echogenic foci: peripheral calcifications (2)   ACR TI-RADS total points: 5.   ACR TI-RADS risk category: TR4 (4-6 points).   ACR TI-RADS recommendations:   Given size (<0.9 cm) and appearance, this nodule does NOT meet TI-RADS criteria for biopsy or dedicated follow-up.   _________________________________________________________   No additional right thyroid abnormality or nodule. No regional adenopathy.   IMPRESSION: 1. Moderate diffuse thyroid heterogeneity compatible with chronic medical thyroid disease. 2. 0.9 cm left mid thyroid adjacent TR 3 and TR 4 nodules as above, which do not meet criteria for biopsy or dedicated follow-up.     Electronically Signed   By: Judie Petit.  Shick M.D.   On: 08/06/2023 11:18     Latest Reference Range & Units 09/23/22 10:32 01/23/23 10:21 03/10/23 08:42 04/28/23 09:38 05/14/23 09:29 08/13/23 08:55  TSH 0.450 - 4.500 uIU/mL 8.360 (H) 8.570 (H) 6.470 (H) 8.230 (H)  1.160  T4,Free(Direct) 0.82 - 1.77 ng/dL 8.11 (L) 9.14 7.82 9.56  1.13  Thyroperoxidase Ab SerPl-aCnc <9 IU/mL     332 (H)   (H): Data is abnormally high (L): Data is abnormally low  Assessment & Plan:   ASSESSMENT / PLAN:  1. Hypothyroidism- due to Hashimoto's thyroiditis 2. Multinodular thyroid  Patient with long-standing hypothyroidism, on levothyroxine therapy. On physical exam, patient does not have gross goiter, thyroid nodules, or neck compression symptoms.  Positive TPO antibodies confirm autoimmune etiology.  -Her previsit TFTs are consistent with appropriate hormone replacement.  She is advised to continue her Levothyroxine 125 mcg po daily before breakfast. Will recheck labs in 3 months and adjust dose accordingly.  - We discussed about correct intake of levothyroxine, at fasting, with water,  separated by at least 30 minutes from breakfast, and separated by more than 4 hours from calcium, iron, multivitamins, acid reflux medications (PPIs). -Patient is made aware of the fact that thyroid hormone replacement is needed for life, dose to be adjusted by periodic monitoring of thyroid function tests.  Her thyroid ultrasound shows moderate heterogeneity consistent with chronic medical thyroid disease.  There were 2 nodules which do not meet criteria for additional surveillance or biopsy.    I spent  38  minutes in the care  of the patient today including review of labs from Thyroid Function, CMP, and other relevant labs ; imaging/biopsy records (current and previous including abstractions from other facilities); face-to-face time discussing  her lab results and symptoms, medications doses, her options of short and long term treatment based on the latest standards of care / guidelines;   and documenting the encounter.  [name]  participated in the discussions, expressed understanding, and voiced agreement with the above plans.  All questions were answered to her satisfaction. she is encouraged to contact clinic should she have any questions or concerns prior to her return visit.   FOLLOW UP PLAN:  Return in about 3 months (around 11/20/2023) for Previsit labs, Thyroid follow up.  Ronny Bacon, [facility] [address] [place], [place] [address] Phone: [phone] Fax: [phone]  08/20/2023, 9:49 AM

## 2023-08-20 NOTE — Patient Instructions (Signed)

## 2023-08-29 ENCOUNTER — Ambulatory Visit (INDEPENDENT_AMBULATORY_CARE_PROVIDER_SITE_OTHER): Payer: Medicaid Other | Admitting: Family Medicine

## 2023-08-29 ENCOUNTER — Encounter: Payer: Self-pay | Admitting: Family Medicine

## 2023-08-29 VITALS — BP 138/80 | HR 70 | Resp 16 | Ht 67.0 in | Wt 284.0 lb

## 2023-08-29 DIAGNOSIS — R7301 Impaired fasting glucose: Secondary | ICD-10-CM | POA: Diagnosis not present

## 2023-08-29 DIAGNOSIS — Z114 Encounter for screening for human immunodeficiency virus [HIV]: Secondary | ICD-10-CM | POA: Diagnosis not present

## 2023-08-29 DIAGNOSIS — E7849 Other hyperlipidemia: Secondary | ICD-10-CM | POA: Diagnosis not present

## 2023-08-29 DIAGNOSIS — T7840XS Allergy, unspecified, sequela: Secondary | ICD-10-CM

## 2023-08-29 DIAGNOSIS — I10 Essential (primary) hypertension: Secondary | ICD-10-CM | POA: Diagnosis not present

## 2023-08-29 DIAGNOSIS — E038 Other specified hypothyroidism: Secondary | ICD-10-CM

## 2023-08-29 DIAGNOSIS — G8929 Other chronic pain: Secondary | ICD-10-CM | POA: Diagnosis not present

## 2023-08-29 DIAGNOSIS — H6993 Unspecified Eustachian tube disorder, bilateral: Secondary | ICD-10-CM | POA: Diagnosis not present

## 2023-08-29 DIAGNOSIS — J302 Other seasonal allergic rhinitis: Secondary | ICD-10-CM

## 2023-08-29 DIAGNOSIS — J3089 Other allergic rhinitis: Secondary | ICD-10-CM

## 2023-08-29 DIAGNOSIS — E559 Vitamin D deficiency, unspecified: Secondary | ICD-10-CM

## 2023-08-29 DIAGNOSIS — M545 Low back pain, unspecified: Secondary | ICD-10-CM

## 2023-08-29 MED ORDER — VITAMIN D (ERGOCALCIFEROL) 1.25 MG (50000 UNIT) PO CAPS: 50000.0000 [IU] | ORAL_CAPSULE | ORAL | 1 refills | Status: DC

## 2023-08-29 MED ORDER — TIZANIDINE HCL 4 MG PO TABS: 4.0000 mg | ORAL_TABLET | Freq: Every day | ORAL | 1 refills | Status: DC

## 2023-08-29 MED ORDER — OLMESARTAN MEDOXOMIL-HCTZ 20-12.5 MG PO TABS: 1.0000 | ORAL_TABLET | Freq: Every day | ORAL | 1 refills | Status: AC

## 2023-08-29 MED ORDER — MONTELUKAST SODIUM 10 MG PO TABS: 10.0000 mg | ORAL_TABLET | Freq: Every day | ORAL | 3 refills | Status: AC

## 2023-08-29 MED ORDER — LEVOCETIRIZINE DIHYDROCHLORIDE 5 MG PO TABS: 5.0000 mg | ORAL_TABLET | Freq: Every day | ORAL | 2 refills | Status: DC

## 2023-08-29 NOTE — Patient Instructions (Addendum)
 I appreciate the opportunity to provide care to you today!    Follow up:  4 months  Labs: please stop by the lab today to get your blood drawn (CBC, CMP, TSH, Lipid profile, HgA1c, Vit D)   Eustachian Tube Dysfunction (ETD) Likely due to a pressure imbalance between the middle ear and the external environment. Symptoms may be worsened by altitude changes, allergies, or sinus infections. Start taking Singulair 10 mg at bedtime. Continue taking Xyzal 5 mg at bedtime as previously prescribed. Monitor for signs of ear infection, including increased ear pain, fever, drainage from the ear, or hearing loss. Follow up promptly if any of these symptoms develop.  Please continue to a heart-healthy diet and increase your physical activities. Try to exercise for at least five days a week.    It was a pleasure to see you and I look forward to continuing to work together on your health and well-being. Please do not hesitate to call the office if you need care or have questions about your care.  In case of emergency, please visit the Emergency Department for urgent care, or contact our clinic at [phone] to schedule an appointment. We're here to help you!   Have a wonderful day and week. With Gratitude, Gilmore Laroche MSN, FNP-BC

## 2023-08-29 NOTE — Assessment & Plan Note (Signed)
 The patient is currently taking Synthroid 125 mcg daily.  The patient is encouraged to follow up if experiencing symptoms of hypothyroidism, despite compliance with the current treatment regimen, such as fatigue, weight gain, constipation, dry skin, cold intolerance, hair loss, or depression.  Lab Results  Component Value Date   TSH 1.160 08/13/2023

## 2023-08-29 NOTE — Progress Notes (Signed)
 Established Patient Office Visit  Subjective:  Patient ID: Kristin Baker, female    DOB: [birth year]  Age: 33 y.o. MRN: [id number]  CC:  Chief Complaint  Patient presents with   Ear Fullness    In the past few months her ears started popping a lot and her left ear gets painful and hurting for no reason and feels clogged     HPI Kristin Baker is a 33 y.o. female with past medical history of hypothyroidism, hypertension presents for f/u of  chronic medical conditions. For the details of today's visit, please refer to the assessment and plan.     Past Medical History:  Diagnosis Date   Abdominal pain    ADHD    ANA positive    Back pain    DDD (degenerative disc disease), lumbar    Eye pain, right    Fatigue    Frontal sinusitis    Hypertension    Hypothyroidism    Iron deficiency anemia due to chronic blood loss 06/19/2022   Medical history non-contributory    Obesity, morbid (HCC)    Palpitations    Rheumatoid arthritis [facility])     Past Surgical History:  Procedure Laterality Date   ABLATION     05/22/2020 left side, 06/14/2020 right side    BIOPSY  02/17/2023   Procedure: BIOPSY;  Surgeon: Franky Macho, MD;  Location: [facility];  Service: Endoscopy;;   CHOLECYSTECTOMY     COLONOSCOPY WITH PROPOFOL N/A 02/17/2023   Procedure: COLONOSCOPY WITH PROPOFOL;  Surgeon: Franky Macho, MD;  Location: [facility];  Service: Endoscopy;  Laterality: N/A;  9:45am;asa 2   ESOPHAGOGASTRODUODENOSCOPY (EGD) WITH PROPOFOL N/A 02/17/2023   Procedure: ESOPHAGOGASTRODUODENOSCOPY (EGD) WITH PROPOFOL;  Surgeon: Franky Macho, MD;  Location: [facility];  Service: Endoscopy;  Laterality: N/A;  9:45am;asa 2   HYSTERECTOMY ABDOMINAL WITH SALPINGECTOMY     POLYPECTOMY  02/17/2023   Procedure: POLYPECTOMY;  Surgeon: Franky Macho, MD;  Location: [facility];  Service: Endoscopy;;   ROBOTIC ASSISTED TOTAL HYSTERECTOMY Bilateral 07/30/2022   Procedure: XI ROBOTIC  ASSISTED TOTAL HYSTERECTOMY AND BILATERAL SALPINGECTOMY;  Surgeon: Lazaro Arms, MD;  Location: [facility];  Service: Gynecology;  Laterality: Bilateral;    Family History  Problem Relation Age of Onset   Cervical cancer Maternal Grandmother    Breast cancer Maternal Grandmother    Skin cancer Maternal Grandmother    Heart attack Maternal Grandfather    Diabetes Father    Heart attack Father    Hyperlipidemia Mother    Peripheral Artery Disease Mother    ADD / ADHD Mother    Bipolar disorder Mother    Cervical cancer Mother        at age 89.   ADD / ADHD Sister    Healthy Daughter    Breast cancer Maternal Aunt    Cervical cancer Maternal Aunt    ADD / ADHD Maternal Aunt    Uterine cancer Maternal Aunt    Breast cancer Maternal Aunt    Multiple sclerosis Paternal Aunt    Colon cancer Neg Hx     Social History   Socioeconomic History   Marital status: Married    Spouse name: Not on file   Number of children: 1   Years of education: Not on file   Highest education level: Associate degree: academic program  Occupational History   Not on file  Tobacco Use   Smoking status: Former  Current packs/day: 0.00    Average packs/day: 1 pack/day for 15.0 years (15.0 ttl pk-yrs)    Types: Cigarettes, E-cigarettes    Start date: 11/24/2005    Quit date: 11/24/2020    Years since quitting: 2.7    Passive exposure: Current   Smokeless tobacco: Never   Tobacco comments:    She has quit of and on.  Vaping Use   Vaping status: Some Days  Substance and Sexual Activity   Alcohol use: Yes    Comment: occasionally /social events   Drug use: Not Currently    Comment: smoked weed during teen years   Sexual activity: Not Currently    Birth control/protection: Surgical    Comment: hyst  Other Topics Concern   Not on file  Social History Narrative   Married for May 2021.Lives with husband and daughter.Homemaker.Husband works at [employer].   Social Drivers of Manufacturing engineer Strain: Low Risk  (09/23/2022)   Overall Financial Resource Strain (CARDIA)    Difficulty of Paying Living Expenses: Not very hard  Food Insecurity: Low Risk  (03/01/2023)   Received from [facility]   Hunger Vital Sign    Worried About Running Out of Food in the Last Year: Never true    Ran Out of Food in the Last Year: Never true  Transportation Needs: No Transportation Needs (03/01/2023)   Received from [facility]    In the past 12 months, has lack of reliable transportation kept you from medical appointments, meetings, work or from getting things needed for daily living? : No  Physical Activity: Unknown (09/23/2022)   Exercise Vital Sign    Days of Exercise per Week: Patient declined    Minutes of Exercise per Session: Not on file  Stress: Stress Concern Present (09/23/2022)   [employer] of Occupational Health - Occupational Stress Questionnaire    Feeling of Stress : Very much  Social Connections: Moderately Isolated (09/23/2022)   Social Connection and Isolation Panel [NHANES]    Frequency of Communication with Friends and Family: More than three times a week    Frequency of Social Gatherings with Friends and Family: Twice a week    Attends Religious Services: Never    Database administrator or Organizations: No    Attends Engineer, structural: Not on file    Marital Status: Married  Catering manager Violence: Not At Risk (02/27/2023)   Humiliation, Afraid, Rape, and Kick questionnaire    Fear of Current or Ex-Partner: No    Emotionally Abused: No    Physically Abused: No    Sexually Abused: No    Outpatient Medications Prior to Visit  Medication Sig Dispense Refill   acetaminophen (TYLENOL) 500 MG tablet Take 2 tablets (1,000 mg total) by mouth every 6 (six) hours. 30 tablet 0   albuterol (VENTOLIN HFA) 108 (90 Base) MCG/ACT inhaler Inhale 2 puffs into the lungs every 6 (six) hours as needed for wheezing or shortness  of breath. 18 g 1   amphetamine-dextroamphetamine (ADDERALL XR) 20 MG 24 hr capsule Take 20 mg by mouth daily.     amphetamine-dextroamphetamine (ADDERALL) 5 MG tablet Take 5 mg by mouth daily in the afternoon.     FLUoxetine (PROZAC) 20 MG capsule Take 20 mg by mouth daily.     Fremanezumab-vfrm (AJOVY) 225 MG/1.5ML SOAJ Inject 225 mg into the skin every 30 (thirty) days. 4.5 mL 3   levothyroxine (SYNTHROID) 125 MCG tablet  Take 1 tablet (125 mcg total) by mouth daily. 90 tablet 3   mirtazapine (REMERON) 7.5 MG tablet 1 tablet at bedtime Orally Once a day for 30 days     levocetirizine (XYZAL) 5 MG tablet Take 1 tablet (5 mg total) by mouth daily. 30 tablet 2   olmesartan-hydrochlorothiazide (BENICAR HCT) 20-12.5 MG tablet TAKE 1 TABLET BY MOUTH DAILY 90 tablet 0   tiZANidine (ZANAFLEX) 4 MG tablet Take 1 tablet (4 mg total) by mouth at bedtime. 30 tablet 1   Vitamin D, Ergocalciferol, (DRISDOL) 1.25 MG (50000 UNIT) CAPS capsule Take 1 capsule (50,000 Units total) by mouth every 7 (seven) days. 20 capsule 1   No facility-administered medications prior to visit.    Allergies  Allergen Reactions   Poison Ivy Extract Anaphylaxis   Poison Oak Extract Anaphylaxis   Anser Anser Feather (Goose) Allergy Skin Test    Flexeril [Cyclobenzaprine] Hives   Lactose Intolerance (Gi) Diarrhea   Honeysuckle Flower [Lonicera] Rash   Mixed Grasses Rash    ROS Review of Systems  Constitutional:  Negative for chills and fever.  HENT:  Positive for ear pain. Negative for ear discharge.   Eyes:  Negative for visual disturbance.  Respiratory:  Negative for chest tightness and shortness of breath.   Neurological:  Negative for dizziness and headaches.      Objective:    Physical Exam HENT:     Head: Normocephalic.     Left Ear: Tympanic membrane and ear canal normal. No decreased hearing noted.  No middle ear effusion.     Mouth/Throat:     Mouth: Mucous membranes are moist.  Cardiovascular:      Rate and Rhythm: Normal rate.     Heart sounds: Normal heart sounds.  Pulmonary:     Effort: Pulmonary effort is normal.     Breath sounds: Normal breath sounds.  Neurological:     Mental Status: She is alert.     BP 138/80   Pulse 70   Resp 16   Ht 5\' 7"  (1.702 m)   Wt 284 lb (128.8 kg)   LMP 03/10/2022 (Approximate)   SpO2 98%   BMI 44.48 kg/m  Wt Readings from Last 3 Encounters:  08/29/23 284 lb (128.8 kg)  08/20/23 281 lb 3.2 oz (127.6 kg)  07/28/23 282 lb (127.9 kg)    Lab Results  Component Value Date   TSH 1.160 08/13/2023   Lab Results  Component Value Date   WBC 9.2 07/25/2023   HGB 15.1 (H) 07/25/2023   HCT 43.7 07/25/2023   MCV 86.2 07/25/2023   PLT 405 (H) 07/25/2023   Lab Results  Component Value Date   NA 138 04/28/2023   K 4.1 04/28/2023   CO2 22 04/28/2023   GLUCOSE 92 04/28/2023   BUN 4 (L) 04/28/2023   CREATININE 0.78 04/28/2023   BILITOT <0.2 04/28/2023   ALKPHOS 85 04/28/2023   AST 18 04/28/2023   ALT 16 04/28/2023   PROT 7.3 04/28/2023   ALBUMIN 4.4 04/28/2023   CALCIUM 9.4 04/28/2023   ANIONGAP 11 03/01/2023   EGFR 103 04/28/2023   Lab Results  Component Value Date   CHOL 240 (H) 04/28/2023   Lab Results  Component Value Date   HDL 47 04/28/2023   Lab Results  Component Value Date   LDLCALC 170 (H) 04/28/2023   Lab Results  Component Value Date   TRIG 126 04/28/2023   Lab Results  Component Value Date   CHOLHDL  5.1 (H) 04/28/2023   Lab Results  Component Value Date   HGBA1C 5.4 04/28/2023      Assessment & Plan:  Essential hypertension Assessment & Plan: Controlled Encouraged to continue olmesartan-hydrochlorothiazide 20-12.5 mg daily A low-sodium diet of less than 2,300 mg daily is recommended, along with moderate-intensity physical activity for at least 150 minutes per week. The patient is encouraged to maintain these lifestyle modifications to help manage her blood pressure effectively.  Long-term  considerations were discussed, emphasizing that uncontrolled hypertension increases the risk of cardiovascular diseases, including stroke, coronary artery disease, and heart failure.  The patient is encouraged to seek emergency care if blood pressure exceeds 180/120 and is accompanied by symptoms such as headaches, chest pain, palpitations, blurred vision, or dizziness. She verbalized understanding and will follow up as scheduled.  BP Readings from Last 3 Encounters:  08/29/23 138/80  08/20/23 118/76  07/28/23 (!) 149/90     Orders: -     Olmesartan Medoxomil-HCTZ; Take 1 tablet by mouth daily.  Dispense: 90 tablet; Refill: 1  Subclinical hypothyroidism Assessment & Plan: The patient is currently taking Synthroid 125 mcg daily.  The patient is encouraged to follow up if experiencing symptoms of hypothyroidism, despite compliance with the current treatment regimen, such as fatigue, weight gain, constipation, dry skin, cold intolerance, hair loss, or depression.  Lab Results  Component Value Date   TSH 1.160 08/13/2023      Eustachian tube dysfunction, bilateral Assessment & Plan: Likely due to a pressure imbalance between the middle ear and the external environment. Symptoms may be worsened by altitude allergies Start taking Singulair 10 mg at bedtime. Continue taking Xyzal 5 mg at bedtime as previously prescribed. Monitor for signs of ear infection, including increased ear pain, fever, drainage from the ear, or hearing loss. Follow up promptly if any of these symptoms develop.   Chronic bilateral low back pain without sciatica -     tiZANidine HCl; Take 1 tablet (4 mg total) by mouth at bedtime.  Dispense: 60 tablet; Refill: 1  Seasonal and perennial allergic rhinitis -     Levocetirizine Dihydrochloride; Take 1 tablet (5 mg total) by mouth daily.  Dispense: 30 tablet; Refill: 2  Allergy, sequela -     Montelukast Sodium; Take 1 tablet (10 mg total) by mouth at bedtime.   Dispense: 30 tablet; Refill: 3  IFG (impaired fasting glucose) -     Hemoglobin A1c  Vitamin D deficiency -     VITAMIN D 25 Hydroxy (Vit-D Deficiency, Fractures) -     Vitamin D (Ergocalciferol); Take 1 capsule (50,000 Units total) by mouth every 7 (seven) days.  Dispense: 20 capsule; Refill: 1  Encounter for screening for HIV -     HIV Antibody (routine testing w rflx)  TSH (thyroid-stimulating hormone deficiency) -     TSH + free T4  Other hyperlipidemia -     Lipid panel -     CMP14+EGFR -     CBC with Differential/Platelet  Note: This chart has been completed using Engineer, civil (consulting) software, and while attempts have been made to ensure accuracy, certain words and phrases may not be transcribed as intended.    Follow-up: Return in about 4 months (around 12/29/2023).   Gilmore Laroche, FNP

## 2023-08-29 NOTE — Assessment & Plan Note (Addendum)
 Controlled Encouraged to continue olmesartan-hydrochlorothiazide 20-12.5 mg daily A low-sodium diet of less than 2,300 mg daily is recommended, along with moderate-intensity physical activity for at least 150 minutes per week. The patient is encouraged to maintain these lifestyle modifications to help manage her blood pressure effectively.  Long-term considerations were discussed, emphasizing that uncontrolled hypertension increases the risk of cardiovascular diseases, including stroke, coronary artery disease, and heart failure.  The patient is encouraged to seek emergency care if blood pressure exceeds 180/120 and is accompanied by symptoms such as headaches, chest pain, palpitations, blurred vision, or dizziness. She verbalized understanding and will follow up as scheduled.  BP Readings from Last 3 Encounters:  08/29/23 138/80  08/20/23 118/76  07/28/23 (!) 149/90

## 2023-08-29 NOTE — Assessment & Plan Note (Signed)
 Likely due to a pressure imbalance between the middle ear and the external environment. Symptoms may be worsened by altitude allergies Start taking Singulair 10 mg at bedtime. Continue taking Xyzal 5 mg at bedtime as previously prescribed. Monitor for signs of ear infection, including increased ear pain, fever, drainage from the ear, or hearing loss. Follow up promptly if any of these symptoms develop.

## 2023-08-30 LAB — LIPID PANEL
Chol/HDL Ratio: 4.9 ratio — ABNORMAL HIGH (ref 0.0–4.4)
Cholesterol, Total: 207 mg/dL — ABNORMAL HIGH (ref 100–199)
HDL: 42 mg/dL (ref >39)
LDL Chol Calc (NIH): 137 mg/dL — ABNORMAL HIGH (ref 0–99)
Triglycerides: 154 mg/dL — ABNORMAL HIGH (ref 0–149)
VLDL Cholesterol Cal: 28 mg/dL (ref 5–40)

## 2023-08-30 LAB — CBC WITH DIFFERENTIAL/PLATELET
Basophils Absolute: 0.1 10*3/uL (ref 0.0–0.2)
Basos: 1 %
EOS (ABSOLUTE): 0.2 10*3/uL (ref 0.0–0.4)
Eos: 2 %
Hematocrit: 42.4 % (ref 34.0–46.6)
Hemoglobin: 14.4 g/dL (ref 11.1–15.9)
Immature Grans (Abs): 0 10*3/uL (ref 0.0–0.1)
Immature Granulocytes: 0 %
Lymphocytes Absolute: 2.6 10*3/uL (ref 0.7–3.1)
Lymphs: 29 %
MCH: 30.4 pg (ref 26.6–33.0)
MCHC: 34 g/dL (ref 31.5–35.7)
MCV: 90 fL (ref 79–97)
Monocytes Absolute: 0.5 10*3/uL (ref 0.1–0.9)
Monocytes: 6 %
Neutrophils Absolute: 5.6 10*3/uL (ref 1.4–7.0)
Neutrophils: 62 %
Platelets: 422 10*3/uL (ref 150–450)
RBC: 4.73 x10E6/uL (ref 3.77–5.28)
RDW: 13.4 % (ref 11.7–15.4)
WBC: 8.9 10*3/uL (ref 3.4–10.8)

## 2023-08-30 LAB — CMP14+EGFR
ALT: 16 IU/L (ref 0–32)
AST: 13 IU/L (ref 0–40)
Albumin: 4.2 g/dL (ref 3.9–4.9)
Alkaline Phosphatase: 79 IU/L (ref 44–121)
BUN/Creatinine Ratio: 13 (ref 9–23)
BUN: 11 mg/dL (ref 6–20)
Bilirubin Total: <0.2 mg/dL (ref 0.0–1.2)
CO2: 21 mmol/L (ref 20–29)
Calcium: 9.5 mg/dL (ref 8.7–10.2)
Chloride: 102 mmol/L (ref 96–106)
Creatinine, Ser: 0.86 mg/dL (ref 0.57–1.00)
Globulin, Total: 2.9 g/dL (ref 1.5–4.5)
Glucose: 86 mg/dL (ref 70–99)
Potassium: 4.1 mmol/L (ref 3.5–5.2)
Sodium: 138 mmol/L (ref 134–144)
Total Protein: 7.1 g/dL (ref 6.0–8.5)
eGFR: 91 mL/min/{1.73_m2} (ref >59)

## 2023-08-30 LAB — TSH+FREE T4
Free T4: 1.08 ng/dL (ref 0.82–1.77)
TSH: 6.41 u[IU]/mL — ABNORMAL HIGH (ref 0.450–4.500)

## 2023-08-30 LAB — HIV ANTIBODY (ROUTINE TESTING W REFLEX): HIV Screen 4th Generation wRfx: NONREACTIVE

## 2023-08-30 LAB — VITAMIN D 25 HYDROXY (VIT D DEFICIENCY, FRACTURES): Vit D, 25-Hydroxy: 38.7 ng/mL (ref 30.0–100.0)

## 2023-08-30 LAB — HEMOGLOBIN A1C
Est. average glucose Bld gHb Est-mCnc: 108 mg/dL
Hgb A1c MFr Bld: 5.4 % (ref 4.8–5.6)

## 2023-08-31 ENCOUNTER — Encounter: Payer: Self-pay | Admitting: Family Medicine

## 2023-08-31 NOTE — Progress Notes (Signed)
 Hello [name] I wanted to share Kristin Baker thyroid levels with you.

## 2023-09-01 DIAGNOSIS — F988 Other specified behavioral and emotional disorders with onset usually occurring in childhood and adolescence: Secondary | ICD-10-CM | POA: Diagnosis not present

## 2023-09-01 DIAGNOSIS — F329 Major depressive disorder, single episode, unspecified: Secondary | ICD-10-CM | POA: Diagnosis not present

## 2023-09-01 DIAGNOSIS — F439 Reaction to severe stress, unspecified: Secondary | ICD-10-CM | POA: Diagnosis not present

## 2023-09-15 DIAGNOSIS — F329 Major depressive disorder, single episode, unspecified: Secondary | ICD-10-CM | POA: Diagnosis not present

## 2023-09-15 DIAGNOSIS — F411 Generalized anxiety disorder: Secondary | ICD-10-CM | POA: Diagnosis not present

## 2023-09-15 DIAGNOSIS — F909 Attention-deficit hyperactivity disorder, unspecified type: Secondary | ICD-10-CM | POA: Diagnosis not present

## 2023-09-23 ENCOUNTER — Encounter: Payer: Self-pay | Admitting: Hematology

## 2023-09-23 ENCOUNTER — Other Ambulatory Visit (HOSPITAL_COMMUNITY): Payer: Self-pay

## 2023-10-13 DIAGNOSIS — F329 Major depressive disorder, single episode, unspecified: Secondary | ICD-10-CM | POA: Diagnosis not present

## 2023-10-13 DIAGNOSIS — F909 Attention-deficit hyperactivity disorder, unspecified type: Secondary | ICD-10-CM | POA: Diagnosis not present

## 2023-10-13 DIAGNOSIS — F411 Generalized anxiety disorder: Secondary | ICD-10-CM | POA: Diagnosis not present

## 2023-10-15 DIAGNOSIS — Z79899 Other long term (current) drug therapy: Secondary | ICD-10-CM | POA: Diagnosis not present

## 2023-10-27 DIAGNOSIS — F909 Attention-deficit hyperactivity disorder, unspecified type: Secondary | ICD-10-CM | POA: Diagnosis not present

## 2023-10-27 DIAGNOSIS — F329 Major depressive disorder, single episode, unspecified: Secondary | ICD-10-CM | POA: Diagnosis not present

## 2023-10-27 DIAGNOSIS — F411 Generalized anxiety disorder: Secondary | ICD-10-CM | POA: Diagnosis not present

## 2023-11-05 ENCOUNTER — Other Ambulatory Visit: Payer: Self-pay

## 2023-11-05 ENCOUNTER — Ambulatory Visit
Admission: RE | Admit: 2023-11-05 | Discharge: 2023-11-05 | Disposition: A | Discharge status: Home / Self Care | Source: Ambulatory Visit | Attending: Nurse Practitioner | Admitting: Nurse Practitioner

## 2023-11-05 VITALS — BP 134/92 | HR 92 | Temp 98.4°F | Resp 20

## 2023-11-05 DIAGNOSIS — K0889 Other specified disorders of teeth and supporting structures: Secondary | ICD-10-CM

## 2023-11-05 MED ORDER — KETOROLAC TROMETHAMINE 60 MG/2ML IM SOLN
60.0000 mg | Freq: Once | INTRAMUSCULAR | Status: AC
  Administered 2023-11-05: 60 mg via INTRAMUSCULAR

## 2023-11-05 MED ORDER — LIDOCAINE VISCOUS HCL 2 % MT SOLN: 15.0000 mL | OROMUCOSAL | 0 refills | Status: AC | PRN

## 2023-11-05 MED ORDER — AMOXICILLIN-POT CLAVULANATE 875-125 MG PO TABS: 1.0000 | ORAL_TABLET | Freq: Two times a day (BID) | ORAL | 0 refills | Status: AC

## 2023-11-05 NOTE — ED Triage Notes (Signed)
 Pt reports cracked molars a few months ago and reports most recent episode of dental pain started Friday.

## 2023-11-05 NOTE — Discharge Instructions (Addendum)
 We have given you an injection of a medication called Toradol  today to help with pain.  Do not take any other NSAIDs for at least 48 hours.  You can continue Tylenol  500 to 1000 mg every 6 hours as needed for pain.  You can also use the topical lidocaine  as needed to help with pain and help numb the area.  Take the Augmentin twice daily for 7 days as prescribed to treat for dental infection.  Recommend following up closely with a dentist-dental resource guide is attached for low cost resources.

## 2023-11-05 NOTE — ED Provider Notes (Signed)
 [facility]    CSN: [id number] Arrival date & time: 11/05/23  0848      History   Chief Complaint Chief Complaint  Patient presents with   Dental Problem    I have an infected tooth and need antibiotics and pain relief until I can get in with the dentist. - Entered by patient    HPI Kristin Baker is a 33 y.o. female.   Patient presents today with right sided upper and lower jaw pain.  She reports multiple broken and cracked teeth at the back of her mouth and reports she does grind her teeth.  She is also concerned for dental infection because she feels like the outside of her face is swollen.  No known fever and no nausea or vomiting.  She has been taking Tylenol  and ibuprofen  for the pain with minimal relief.  Does not have a dentist currently.    Past Medical History:  Diagnosis Date   Abdominal pain    ADHD    ANA positive    Back pain    DDD (degenerative disc disease), lumbar    Eye pain, right    Fatigue    Frontal sinusitis    Hypertension    Hypothyroidism    Iron deficiency anemia due to chronic blood loss 06/19/2022   Medical history non-contributory    Obesity, morbid (HCC)    Palpitations    Rheumatoid arthritis (HCC)     Patient Active Problem List   Diagnosis Date Noted   Eustachian tube dysfunction, bilateral 08/29/2023   Sedimentation rate elevation 05/14/2023   Tinea corporis 04/28/2023   Elevated liver function tests 02/28/2023   Choledocholithiasis 02/28/2023   Post-operative pain 02/27/2023   Calculus of gallbladder without cholecystitis without obstruction 02/19/2023   Hematochezia 02/19/2023   Cholecystitis 02/19/2023   Abdominal pain 02/18/2023   Acute urinary retention 02/18/2023   Gastritis and gastroduodenitis 02/17/2023   Hyperplastic polyp of ascending colon 02/17/2023   Hyperplastic polyp of sigmoid colon 02/17/2023   Hot flashes 01/24/2023   Chronic idiopathic constipation 01/22/2023   Epigastric abdominal  tenderness with rebound tenderness 01/22/2023   Bilious vomiting with nausea 01/22/2023   Fecal occult blood test positive 01/22/2023   BMI 45.0-49.9, adult (HCC) 01/22/2023   Mild peripheral edema 10/23/2022   Dysmenorrhea 08/21/2022   Iron deficiency anemia due to chronic blood loss 06/19/2022   Low iron 05/06/2022   Irregular bleeding 04/11/2022   Menometrorrhagia 04/11/2022   Pelvic pain 04/11/2022   Annual physical exam 10/24/2021   Fatigue 10/24/2021   Injury of foot, left, initial encounter 10/11/2021   Left shoulder pain 08/30/2021   Seasonal and perennial allergic rhinitis 07/18/2021   Hyperlipidemia 06/25/2021   H/O multiple allergies 05/14/2021   Anxiety and depression 05/14/2021   Morbid obesity (HCC) 05/14/2021   Hypertension 05/14/2021   Allergies 05/14/2021   Chronic bilateral low back pain without sciatica 05/14/2021   Vitamin D  deficiency 01/15/2021   Chronic migraine w/o aura, not intractable, w/o stat migr 01/15/2021   Chronic daily headache 01/15/2021   White matter abnormality on MRI of brain 01/15/2021   Subclinical hypothyroidism 07/10/2020   Essential hypertension 07/10/2020   DDD (degenerative disc disease), lumbar 01/11/2020    Past Surgical History:  Procedure Laterality Date   ABLATION     05/22/2020 left side, 06/14/2020 right side    BIOPSY  02/17/2023   Procedure: BIOPSY;  Surgeon: Hargis Lias, MD;  Location: [facility];  Service: Endoscopy;;  CHOLECYSTECTOMY     COLONOSCOPY WITH PROPOFOL  N/A 02/17/2023   Procedure: COLONOSCOPY WITH PROPOFOL ;  Surgeon: Hargis Lias, MD;  Location: [facility];  Service: Endoscopy;  Laterality: N/A;  9:45am;asa 2   ESOPHAGOGASTRODUODENOSCOPY (EGD) WITH PROPOFOL  N/A 02/17/2023   Procedure: ESOPHAGOGASTRODUODENOSCOPY (EGD) WITH PROPOFOL ;  Surgeon: Hargis Lias, MD;  Location: [facility];  Service: Endoscopy;  Laterality: N/A;  9:45am;asa 2   HYSTERECTOMY ABDOMINAL WITH SALPINGECTOMY      POLYPECTOMY  02/17/2023   Procedure: POLYPECTOMY;  Surgeon: Hargis Lias, MD;  Location: [facility];  Service: Endoscopy;;   ROBOTIC ASSISTED TOTAL HYSTERECTOMY Bilateral 07/30/2022   Procedure: XI ROBOTIC ASSISTED TOTAL HYSTERECTOMY AND BILATERAL SALPINGECTOMY;  Surgeon: Wendelyn Halter, MD;  Location: [facility];  Service: Gynecology;  Laterality: Bilateral;    OB History     Gravida  2   Para  1   Term  1   Preterm      AB  1   Living  1      SAB      IAB      Ectopic      Multiple      Live Births               Home Medications    Prior to Admission medications   Medication Sig Start Date End Date Taking? Authorizing Provider  amoxicillin-clavulanate (AUGMENTIN) 875-125 MG tablet Take 1 tablet by mouth 2 (two) times daily for 7 days. 11/05/23 11/12/23 Yes Wilhemena Harbour, NP  lidocaine  (XYLOCAINE ) 2 % solution Use as directed 15 mLs in the mouth or throat every 3 (three) hours as needed for mouth pain. Apply topically to affected areas as needed for dental pain 11/05/23  Yes Thena Fireman A, NP  acetaminophen  (TYLENOL ) 500 MG tablet Take 2 tablets (1,000 mg total) by mouth every 6 (six) hours. 02/19/23   Pappayliou, Bynum Cassis A, DO  albuterol  (VENTOLIN  HFA) 108 (90 Base) MCG/ACT inhaler Inhale 2 puffs into the lungs every 6 (six) hours as needed for wheezing or shortness of breath. 10/23/22   Zarwolo, Gloria, FNP  amphetamine -dextroamphetamine  (ADDERALL XR) 20 MG 24 hr capsule Take 20 mg by mouth daily. 04/02/22   [provider]  amphetamine -dextroamphetamine  (ADDERALL) 5 MG tablet Take 5 mg by mouth daily in the afternoon.    [provider]  FLUoxetine  (PROZAC ) 20 MG capsule Take 20 mg by mouth daily. 04/28/23   [provider]  Fremanezumab -vfrm (AJOVY ) 225 MG/1.5ML SOAJ Inject 225 mg into the skin every 30 (thirty) days. 06/18/23   Lomax, Amy, NP  levocetirizine (XYZAL ) 5 MG tablet Take 1 tablet (5 mg total) by mouth daily.  08/29/23 11/27/23  Zarwolo, Gloria, FNP  levothyroxine  (SYNTHROID ) 125 MCG tablet Take 1 tablet (125 mcg total) by mouth daily. 05/02/23   Zarwolo, Gloria, FNP  mirtazapine (REMERON) 7.5 MG tablet 1 tablet at bedtime Orally Once a day for 30 days 07/01/23   [provider]  montelukast  (SINGULAIR ) 10 MG tablet Take 1 tablet (10 mg total) by mouth at bedtime. 08/29/23   Zarwolo, Gloria, FNP  olmesartan -hydrochlorothiazide  (BENICAR  HCT) 20-12.5 MG tablet Take 1 tablet by mouth daily. 08/29/23   Zarwolo, Gloria, FNP  tiZANidine  (ZANAFLEX ) 4 MG tablet Take 1 tablet (4 mg total) by mouth at bedtime. 08/29/23   Zarwolo, Gloria, FNP  Vitamin D , Ergocalciferol , (DRISDOL ) 1.25 MG (50000 UNIT) CAPS capsule Take 1 capsule (50,000 Units total) by mouth every 7 (seven) days.  08/29/23   Zarwolo, Gloria, FNP    Family History Family History  Problem Relation Age of Onset   Cervical cancer Maternal Grandmother    Breast cancer Maternal Grandmother    Skin cancer Maternal Grandmother    Heart attack Maternal Grandfather    Diabetes Father    Heart attack Father    Hyperlipidemia Mother    Peripheral Artery Disease Mother    ADD / ADHD Mother    Bipolar disorder Mother    Cervical cancer Mother        at age 50.   ADD / ADHD Sister    Healthy Daughter    Breast cancer Maternal Aunt    Cervical cancer Maternal Aunt    ADD / ADHD Maternal Aunt    Uterine cancer Maternal Aunt    Breast cancer Maternal Aunt    Multiple sclerosis Paternal Aunt    Colon cancer Neg Hx     Social History Social History   Tobacco Use   Smoking status: Former    Current packs/day: 0.00    Average packs/day: 1 pack/day for 15.0 years (15.0 ttl pk-yrs)    Types: Cigarettes, E-cigarettes    Start date: 11/24/2005    Quit date: 11/24/2020    Years since quitting: 2.9    Passive exposure: Current   Smokeless tobacco: Never   Tobacco comments:    She has quit of and on.  Vaping Use   Vaping status: Some Days  Substance Use  Topics   Alcohol use: Yes    Comment: occasionally /social events   Drug use: Not Currently    Comment: smoked weed during teen years     Allergies   Poison ivy extract, Poison oak extract, Anser anser feather (goose) allergy skin test, Flexeril [cyclobenzaprine], Lactose intolerance (gi), Honeysuckle flower [lonicera], and Mixed grasses   Review of Systems Review of Systems Per HPI  Physical Exam Triage Vital Signs ED Triage Vitals  Encounter Vitals Group     BP 11/05/23 0917 (!) 134/92     Systolic BP Percentile --      Diastolic BP Percentile --      Pulse Rate 11/05/23 0917 92     Resp 11/05/23 0917 20     Temp 11/05/23 0917 98.4 F (36.9 C)     Temp Source 11/05/23 0917 Oral     SpO2 11/05/23 0917 96 %     Weight --      Height --      Head Circumference --      Peak Flow --      Pain Score 11/05/23 0919 7     Pain Loc --      Pain Education --      Exclude from Growth Chart --    No data found.  Updated Vital Signs BP (!) 134/92 (BP Location: Right Arm)   Pulse 92   Temp 98.4 F (36.9 C) (Oral)   Resp 20   LMP 03/10/2022 (Approximate)   SpO2 96%   Visual Acuity Right Eye Distance:   Left Eye Distance:   Bilateral Distance:    Right Eye Near:   Left Eye Near:    Bilateral Near:     Physical Exam Vitals and nursing note reviewed.  Constitutional:      General: She is not in acute distress.    Appearance: Normal appearance. She is not toxic-appearing.  HENT:     Right Ear: External ear normal.  Left Ear: External ear normal.     Mouth/Throat:     Mouth: Mucous membranes are moist. No oral lesions.     Dentition: Abnormal dentition. Dental tenderness and dental caries present. No gingival swelling, dental abscesses or gum lesions.     Pharynx: Oropharynx is clear.     Comments: Multiple open fractures of upper and lower teeth  Pulmonary:     Effort: Pulmonary effort is normal. No respiratory distress.  Musculoskeletal:     Cervical back:  Normal range of motion.  Lymphadenopathy:     Cervical: No cervical adenopathy.  Skin:    General: Skin is warm and dry.     Capillary Refill: Capillary refill takes less than 2 seconds.  Neurological:     Mental Status: She is alert and oriented to person, place, and time.  Psychiatric:        Behavior: Behavior is cooperative.      UC Treatments / Results  Labs (all labs ordered are listed, but only abnormal results are displayed) Labs Reviewed - No data to display  EKG   Radiology No results found.  Procedures Procedures (including critical care time)  Medications Ordered in UC Medications  ketorolac  (TORADOL ) injection 60 mg (60 mg Intramuscular Given 11/05/23 0945)    Initial Impression / Assessment and Plan / UC Course  I have reviewed the triage vital signs and the nursing notes.  Pertinent labs & imaging results that were available during my care of the patient were reviewed by me and considered in my medical decision making (see chart for details).   Patient is well-appearing, normotensive, afebrile, not tachycardic, not tachypneic, oxygenating well on room air.   1. Dentalgia Pain treated with Toradol  60 mg IM in urgent care today; discussed to avoid NSAIDs reported and can take Tylenol  every 6 hours/use lidocaine  as needed topically In addition, will start patient on course of Augmentin to cover for dental infection Recommended follow-up with dentist and resources given today  The patient was given the opportunity to ask questions.  All questions answered to their satisfaction.  The patient is in agreement to this plan.   Final Clinical Impressions(s) / UC Diagnoses   Final diagnoses:  Dentalgia   Discharge Instructions      We have given you an injection of a medication called Toradol  today to help with pain.  Do not take any other NSAIDs for at least 48 hours.  You can continue Tylenol  500 to 1000 mg every 6 hours as needed for pain.  You can also use  the topical lidocaine  as needed to help with pain and help numb the area.  Take the Augmentin twice daily for 7 days as prescribed to treat for dental infection.  Recommend following up closely with a dentist-dental resource guide is attached for low cost resources.   ED Prescriptions     Medication Sig Dispense Auth. Provider   amoxicillin-clavulanate (AUGMENTIN) 875-125 MG tablet Take 1 tablet by mouth 2 (two) times daily for 7 days. 14 tablet Thena Fireman A, NP   lidocaine  (XYLOCAINE ) 2 % solution Use as directed 15 mLs in the mouth or throat every 3 (three) hours as needed for mouth pain. Apply topically to affected areas as needed for dental pain 100 mL Wilhemena Harbour, NP      PDMP not reviewed this encounter.   Wilhemena Harbour, NP 11/05/23 1054

## 2023-11-10 DIAGNOSIS — F329 Major depressive disorder, single episode, unspecified: Secondary | ICD-10-CM | POA: Diagnosis not present

## 2023-11-10 DIAGNOSIS — F988 Other specified behavioral and emotional disorders with onset usually occurring in childhood and adolescence: Secondary | ICD-10-CM | POA: Diagnosis not present

## 2023-11-10 DIAGNOSIS — F439 Reaction to severe stress, unspecified: Secondary | ICD-10-CM | POA: Diagnosis not present

## 2023-11-20 DIAGNOSIS — E063 Autoimmune thyroiditis: Secondary | ICD-10-CM | POA: Diagnosis not present

## 2023-11-21 LAB — T4, FREE: Free T4: 0.69 ng/dL — ABNORMAL LOW (ref 0.82–1.77)

## 2023-11-21 LAB — TSH: TSH: 4.89 u[IU]/mL — ABNORMAL HIGH (ref 0.450–4.500)

## 2023-11-25 DIAGNOSIS — F439 Reaction to severe stress, unspecified: Secondary | ICD-10-CM | POA: Diagnosis not present

## 2023-11-25 DIAGNOSIS — F329 Major depressive disorder, single episode, unspecified: Secondary | ICD-10-CM | POA: Diagnosis not present

## 2023-11-25 DIAGNOSIS — F988 Other specified behavioral and emotional disorders with onset usually occurring in childhood and adolescence: Secondary | ICD-10-CM | POA: Diagnosis not present

## 2023-11-26 NOTE — Patient Instructions (Signed)

## 2023-11-27 ENCOUNTER — Encounter: Payer: Self-pay | Admitting: Nurse Practitioner

## 2023-11-27 ENCOUNTER — Ambulatory Visit: Admitting: Nurse Practitioner

## 2023-11-27 VITALS — BP 118/80 | HR 82 | Ht 67.0 in | Wt 292.4 lb

## 2023-11-27 DIAGNOSIS — E038 Other specified hypothyroidism: Secondary | ICD-10-CM | POA: Diagnosis not present

## 2023-11-27 DIAGNOSIS — E063 Autoimmune thyroiditis: Secondary | ICD-10-CM

## 2023-11-27 MED ORDER — LEVOTHYROXINE SODIUM 150 MCG PO TABS: 150.0000 ug | ORAL_TABLET | Freq: Every day | ORAL | 1 refills | Status: DC

## 2023-11-27 NOTE — Progress Notes (Signed)
 Endocrinology Follow Up Note                                         11/27/2023, 2:17 PM  Subjective:   Subjective    Kristin Baker is a 33 y.o.-year-old female patient being seen in follow up after being seen in consultation for hypothyroidism referred by Zarwolo, Gloria, FNP.   Past Medical History:  Diagnosis Date   Abdominal pain    ADHD    ANA positive    Back pain    DDD (degenerative disc disease), lumbar    Eye pain, right    Fatigue    Frontal sinusitis    Hypertension    Hypothyroidism    Iron deficiency anemia due to chronic blood loss 06/19/2022   Medical history non-contributory    Obesity, morbid (HCC)    Palpitations    Rheumatoid arthritis [facility])     Past Surgical History:  Procedure Laterality Date   ABLATION     05/22/2020 left side, 06/14/2020 right side    BIOPSY  02/17/2023   Procedure: BIOPSY;  Surgeon: Cinderella Deatrice FALCON, MD;  [facility]: [facility];  Service: Endoscopy;;   CHOLECYSTECTOMY     COLONOSCOPY WITH PROPOFOL  N/A 02/17/2023   Procedure: COLONOSCOPY WITH PROPOFOL ;  Surgeon: Cinderella Deatrice FALCON, MD;  Location: [facility];  Service: Endoscopy;  Laterality: N/A;  9:45am;asa 2   ESOPHAGOGASTRODUODENOSCOPY (EGD) WITH PROPOFOL  N/A 02/17/2023   Procedure: ESOPHAGOGASTRODUODENOSCOPY (EGD) WITH PROPOFOL ;  Surgeon: Cinderella Deatrice FALCON, MD;  [facility]: [facility];  Service: Endoscopy;  Laterality: N/A;  9:45am;asa 2   HYSTERECTOMY ABDOMINAL WITH SALPINGECTOMY     POLYPECTOMY  02/17/2023   Procedure: POLYPECTOMY;  Surgeon: Cinderella Deatrice FALCON, MD;  [facility]: [facility];  Service: Endoscopy;;   ROBOTIC ASSISTED TOTAL HYSTERECTOMY Bilateral 07/30/2022   Procedure: XI ROBOTIC ASSISTED TOTAL HYSTERECTOMY AND BILATERAL SALPINGECTOMY;  Surgeon: Jayne Vonn DEL, MD;  Location: [facility];  Service: Gynecology;  Laterality: Bilateral;    Social History   Socioeconomic History   Marital  status: Married    Spouse name: Not on file   Number of children: 1   Years of education: Not on file   Highest education level: Associate degree: academic program  Occupational History   Not on file  Tobacco Use   Smoking status: Former    Current packs/day: 0.00    Average packs/day: 1 pack/day for 15.0 years (15.0 ttl pk-yrs)    Types: Cigarettes, E-cigarettes    Start date: 11/24/2005    Quit date: 11/24/2020    Years since quitting: 3.0    Passive exposure: Current   Smokeless tobacco: Never   Tobacco comments:    She has quit of and on.  Vaping Use   Vaping status: Some Days  Substance and Sexual Activity   Alcohol use: Yes    Comment: occasionally /social events   Drug use: Not Currently    Comment: smoked weed during teen years   Sexual activity: Not  Currently    Birth control/protection: Surgical    Comment: hyst  Other Topics Concern   Not on file  Social History Narrative   Married for May 2021.Lives with husband and daughter.Homemaker.Husband works at [employer].   Social Drivers of Corporate investment banker Strain: Low Risk  (09/23/2022)   Overall Financial Resource Strain (CARDIA)    Difficulty of Paying Living Expenses: Not very hard  Food Insecurity: Low Risk  (03/01/2023)   Received from [facility]   Hunger Vital Sign    Within the past 12 months, you worried that your food would run out before you got money to buy more: Never true    Within the past 12 months, the food you bought just didn't last and you didn't have money to get more. : Never true  Transportation Needs: No Transportation Needs (03/01/2023)   Received from [facility]    In the past 12 months, has lack of reliable transportation kept you from medical appointments, meetings, work or from getting things needed for daily living? : No  Physical Activity: Unknown (09/23/2022)   Exercise Vital Sign    Days of Exercise per Week: Patient declined    Minutes of  Exercise per Session: Not on file  Stress: Stress Concern Present (09/23/2022)   [employer] of Occupational Health - Occupational Stress Questionnaire    Feeling of Stress : Very much  Social Connections: Moderately Isolated (09/23/2022)   Social Connection and Isolation Panel    Frequency of Communication with Friends and Family: More than three times a week    Frequency of Social Gatherings with Friends and Family: Twice a week    Attends Religious Services: Never    Database administrator or Organizations: No    Attends Engineer, structural: Not on file    Marital Status: Married    Family History  Problem Relation Age of Onset   Cervical cancer Maternal Grandmother    Breast cancer Maternal Grandmother    Skin cancer Maternal Grandmother    Heart attack Maternal Grandfather    Diabetes Father    Heart attack Father    Hyperlipidemia Mother    Peripheral Artery Disease Mother    ADD / ADHD Mother    Bipolar disorder Mother    Cervical cancer Mother        at age 18.   ADD / ADHD Sister    Healthy Daughter    Breast cancer Maternal Aunt    Cervical cancer Maternal Aunt    ADD / ADHD Maternal Aunt    Uterine cancer Maternal Aunt    Breast cancer Maternal Aunt    Multiple sclerosis Paternal Aunt    Colon cancer Neg Hx     Outpatient Encounter Medications as of 11/27/2023  Medication Sig   acetaminophen  (TYLENOL ) 500 MG tablet Take 2 tablets (1,000 mg total) by mouth every 6 (six) hours.   albuterol  (VENTOLIN  HFA) 108 (90 Base) MCG/ACT inhaler Inhale 2 puffs into the lungs every 6 (six) hours as needed for wheezing or shortness of breath.   amphetamine -dextroamphetamine  (ADDERALL XR) 20 MG 24 hr capsule Take 20 mg by mouth daily.   amphetamine -dextroamphetamine  (ADDERALL) 5 MG tablet Take 5 mg by mouth daily in the afternoon.   Fremanezumab -vfrm (AJOVY ) 225 MG/1.5ML SOAJ Inject 225 mg into the skin every 30 (thirty) days.   levocetirizine (XYZAL ) 5 MG tablet  Take 1 tablet (5 mg total) by mouth daily.  lidocaine  (XYLOCAINE ) 2 % solution Use as directed 15 mLs in the mouth or throat every 3 (three) hours as needed for mouth pain. Apply topically to affected areas as needed for dental pain   mirtazapine (REMERON) 7.5 MG tablet 1 tablet at bedtime Orally Once a day for 30 days   montelukast  (SINGULAIR ) 10 MG tablet Take 1 tablet (10 mg total) by mouth at bedtime.   olmesartan -hydrochlorothiazide  (BENICAR  HCT) 20-12.5 MG tablet Take 1 tablet by mouth daily.   tiZANidine  (ZANAFLEX ) 4 MG tablet Take 1 tablet (4 mg total) by mouth at bedtime.   Vitamin D , Ergocalciferol , (DRISDOL ) 1.25 MG (50000 UNIT) CAPS capsule Take 1 capsule (50,000 Units total) by mouth every 7 (seven) days.   [DISCONTINUED] levothyroxine  (SYNTHROID ) 125 MCG tablet Take 1 tablet (125 mcg total) by mouth daily.   FLUoxetine  (PROZAC ) 20 MG capsule Take 20 mg by mouth daily. (Patient not taking: Reported on 11/27/2023)   levothyroxine  (SYNTHROID ) 150 MCG tablet Take 1 tablet (150 mcg total) by mouth daily before breakfast.   No facility-administered encounter medications on file as of 11/27/2023.    ALLERGIES: Allergies  Allergen Reactions   Poison Ivy Extract Anaphylaxis   Poison Oak Extract Anaphylaxis   Anser Anser Feather (Goose) Allergy Skin Test    Flexeril [Cyclobenzaprine] Hives   Lactose Intolerance (Gi) Diarrhea   Honeysuckle Flower [Lonicera] Rash   Mixed Grasses Rash   VACCINATION STATUS: Immunization History  Administered Date(s) Administered   Moderna Sars-Covid-2 Vaccination 10/08/2019, 11/05/2019   Tdap 08/31/2020     HPI   Kristin Baker  is a patient with the above medical history. she was diagnosed with hypothyroidism at approximate age of 55 years, which required subsequent initiation of thyroid  hormone replacement therapy. She was first diagnosed when she was with Dr. Caswell (or his NP Lauraine Pereyra).  After his passing, her care was transferred to her  current PCP.  she was given various doses of Levothyroxine  over the years, currently on 125 micrograms.  This has been changed several times by her PCP recently. she reports compliance to this medication:  but she does note she takes it with her BP and allergy pill in the morning.  She does take iron supplement, but takes that at night.  She notes her PCP told her it was ok to take all her medications together.  I reviewed patient's thyroid  tests:  Lab Results  Component Value Date   TSH 4.890 (H) 11/20/2023   TSH 6.410 (H) 08/29/2023   TSH 1.160 08/13/2023   TSH 8.230 (H) 04/28/2023   TSH 6.470 (H) 03/10/2023   TSH 8.570 (H) 01/23/2023   TSH 8.360 (H) 09/23/2022   TSH 2.530 04/26/2022   TSH 3.390 10/19/2021   TSH 14.800 (H) 08/27/2021   FREET4 0.69 (L) 11/20/2023   FREET4 1.08 08/29/2023   FREET4 1.13 08/13/2023   FREET4 1.02 04/28/2023   FREET4 0.94 03/10/2023   FREET4 1.25 01/23/2023   FREET4 0.70 (L) 09/23/2022   FREET4 1.13 04/26/2022   FREET4 0.94 10/19/2021   FREET4 0.72 (L) 06/25/2021    Pt denies feeling nodules in neck, hoarseness, SOB with lying down.  she does have extensive family history of autoimmune disorders and has family history of thyroid  disorders in her uncle (total thyroidectomy for unknown reason), grandmother (? Hypothyroidism) and mom (recently diagnosed with thyroid  issues).  She did report her uncle has history of thyroid  cancer (requiring total thyroidectomy).  No history of radiation therapy to head or neck.  No recent use of iodine  supplements.  Denies use of Biotin containing supplements.  I reviewed her chart and she also has a history of migraines, HTN, constipation, depression/anxiety, HLD, anemia, and vitamin d  deficiency.  Review of systems  Constitutional: + increasing body weight,  current Body mass index is 45.8 kg/m. , + fluctuating fatigue, + subjective hyperthermia (mostly at night), no subjective hypothermia Eyes: no blurry vision, no  xerophthalmia ENT: no sore throat, no nodules palpated in throat, no dysphagia/odynophagia, no hoarseness Cardiovascular: no chest pain, no shortness of breath, no palpitations, no leg swelling Respiratory: no cough, no shortness of breath Gastrointestinal: no nausea/vomiting/diarrhea Musculoskeletal: no muscle/joint aches Skin: no rashes, no hyperemia Neurological: no tremors, no numbness, no tingling, no dizziness Psychiatric: no depression, no anxiety   Objective:   Objective     BP 118/80 (BP Location: Left Arm, Patient Position: Sitting, Cuff Size: Large)   Pulse 82   Ht 5' 7 (1.702 m)   Wt 292 lb 6.4 oz (132.6 kg)   LMP 03/10/2022 (Approximate)   BMI 45.80 kg/m  Wt Readings from Last 3 Encounters:  11/27/23 292 lb 6.4 oz (132.6 kg)  08/29/23 284 lb (128.8 kg)  08/20/23 281 lb 3.2 oz (127.6 kg)    BP Readings from Last 3 Encounters:  11/27/23 118/80  11/05/23 (!) 134/92  08/29/23 138/80      Physical Exam- Limited  Constitutional:  Body mass index is 45.8 kg/m. , not in acute distress, normal state of mind Eyes:  EOMI, no exophthalmos Musculoskeletal: no gross deformities, strength intact in all four extremities, no gross restriction of joint movements Skin:  no rashes, no hyperemia Neurological: no tremor with outstretched hands   CMP ( most recent) CMP     Component Value Date/Time   NA 138 08/29/2023 0924   K 4.1 08/29/2023 0924   CL 102 08/29/2023 0924   CO2 21 08/29/2023 0924   GLUCOSE 86 08/29/2023 0924   GLUCOSE 108 (H) 03/14/2023 0910   BUN 11 08/29/2023 0924   CREATININE 0.86 08/29/2023 0924   CREATININE 0.73 03/14/2023 0910   CALCIUM 9.5 08/29/2023 0924   PROT 7.1 08/29/2023 0924   ALBUMIN 4.2 08/29/2023 0924   AST 13 08/29/2023 0924   ALT 16 08/29/2023 0924   ALKPHOS 79 08/29/2023 0924   BILITOT <0.2 08/29/2023 0924   EGFR 91 08/29/2023 0924   GFRNONAA >60 03/01/2023 0558   GFRNONAA 105 10/04/2020 0000     Diabetic Labs (most  recent): Lab Results  Component Value Date   HGBA1C 5.4 08/29/2023   HGBA1C 5.4 04/28/2023   HGBA1C 5.3 09/23/2022     Lipid Panel ( most recent) Lipid Panel     Component Value Date/Time   CHOL 207 (H) 08/29/2023 0924   TRIG 154 (H) 08/29/2023 0924   HDL 42 08/29/2023 0924   CHOLHDL 4.9 (H) 08/29/2023 0924   CHOLHDL 7.0 (H) 01/11/2020 1154   LDLCALC 137 (H) 08/29/2023 0924   LDLCALC 166 (H) 01/11/2020 1154   LABVLDL 28 08/29/2023 0924       Lab Results  Component Value Date   TSH 4.890 (H) 11/20/2023   TSH 6.410 (H) 08/29/2023   TSH 1.160 08/13/2023   TSH 8.230 (H) 04/28/2023   TSH 6.470 (H) 03/10/2023   TSH 8.570 (H) 01/23/2023   TSH 8.360 (H) 09/23/2022   TSH 2.530 04/26/2022   TSH 3.390 10/19/2021   TSH 14.800 (H) 08/27/2021   FREET4 0.69 (L) 11/20/2023   FREET4  1.08 08/29/2023   FREET4 1.13 08/13/2023   FREET4 1.02 04/28/2023   FREET4 0.94 03/10/2023   FREET4 1.25 01/23/2023   FREET4 0.70 (L) 09/23/2022   FREET4 1.13 04/26/2022   FREET4 0.94 10/19/2021   FREET4 0.72 (L) 06/25/2021    Thyroid  US  from 07/31/23 CLINICAL DATA:  Hashimoto's thyroiditis   EXAM: THYROID  ULTRASOUND   TECHNIQUE: Ultrasound examination of the thyroid  gland and adjacent soft tissues was performed.   COMPARISON:  None Available.   FINDINGS: Parenchymal Echotexture: Moderately heterogenous   Isthmus: 2 mm   Right lobe: 4.3 x 1.8 x 1.1 cm   Left lobe: 3.5 x 1.2 x 1.3 cm   _________________________________________________________   Estimated total number of nodules >/= 1 cm: 0   Number of spongiform nodules >/=  2 cm not described below (TR1): 0   Number of mixed cystic and solid nodules >/= 1.5 cm not described below (TR2): 0   _________________________________________________________   Moderate diffuse thyroid  heterogeneity without hypervascularity compatible with chronic medical thyroid  disease.   Nodule # 1:   Location: Left; Mid   Maximum size: 0.9 cm;  Other 2 dimensions: 0.8 x 0.7 cm   Composition: solid/almost completely solid (2)   Echogenicity: isoechoic (1)   Shape: not taller-than-wide (0)   Margins: ill-defined (0)   Echogenic foci: none (0)   ACR TI-RADS total points: 3.   ACR TI-RADS risk category: TR3 (3 points).   ACR TI-RADS recommendations:   Given size (<1.4 cm) and appearance, this nodule does NOT meet TI-RADS criteria for biopsy or dedicated follow-up.   _________________________________________________________   Nodule # 2:   Location: Left; Mid   Maximum size: 0.9 cm; Other 2 dimensions: 0.8 x 0.8 cm   Composition: solid/almost completely solid (2)   Echogenicity: isoechoic (1)   Shape: not taller-than-wide (0)   Margins: ill-defined (0)   Echogenic foci: peripheral calcifications (2)   ACR TI-RADS total points: 5.   ACR TI-RADS risk category: TR4 (4-6 points).   ACR TI-RADS recommendations:   Given size (<0.9 cm) and appearance, this nodule does NOT meet TI-RADS criteria for biopsy or dedicated follow-up.   _________________________________________________________   No additional right thyroid  abnormality or nodule. No regional adenopathy.   IMPRESSION: 1. Moderate diffuse thyroid  heterogeneity compatible with chronic medical thyroid  disease. 2. 0.9 cm left mid thyroid  adjacent TR 3 and TR 4 nodules as above, which do not meet criteria for biopsy or dedicated follow-up.     Electronically Signed   By: CHRISTELLA.  Shick M.D.   On: 08/06/2023 11:18     Latest Reference Range & Units 04/28/23 09:38 05/14/23 09:29 08/13/23 08:55 08/29/23 09:24 11/20/23 10:23  TSH 0.450 - 4.500 uIU/mL 8.230 (H)  1.160 6.410 (H) 4.890 (H)  T4,Free(Direct) 0.82 - 1.77 ng/dL 8.97  8.86 8.91 9.30 (L)  Thyroperoxidase Ab SerPl-aCnc <9 IU/mL  332 (H)     (H): Data is abnormally high (L): Data is abnormally low  Assessment & Plan:   ASSESSMENT / PLAN:  1. Hypothyroidism- due to Hashimoto's thyroiditis 2.  Multinodular thyroid   Patient with long-standing hypothyroidism, on levothyroxine  therapy. On physical exam, patient does not have gross goiter, thyroid  nodules, or neck compression symptoms.  Positive TPO antibodies confirm autoimmune etiology.  -Her previsit TFTs are consistent with under-replacement.  She is advised to increase Levothyroxine  to 150 mcg po daily before breakfast. Will recheck labs in 3 months and adjust dose accordingly.  - We discussed about correct intake of levothyroxine , at  fasting, with water , separated by at least 30 minutes from breakfast, and separated by more than 4 hours from calcium, iron, multivitamins, acid reflux medications (PPIs). -Patient is made aware of the fact that thyroid  hormone replacement is needed for life, dose to be adjusted by periodic monitoring of thyroid  function tests.  Her thyroid  ultrasound shows moderate heterogeneity consistent with chronic medical thyroid  disease.  There were 2 nodules which do not meet criteria for additional surveillance or biopsy.    I spent  22  minutes in the care of the patient today including review of labs from Thyroid  Function, CMP, and other relevant labs ; imaging/biopsy records (current and previous including abstractions from other facilities); face-to-face time discussing  her lab results and symptoms, medications doses, her options of short and long term treatment based on the latest standards of care / guidelines;   and documenting the encounter.  Clotilda Rower  participated in the discussions, expressed understanding, and voiced agreement with the above plans.  All questions were answered to her satisfaction. she is encouraged to contact clinic should she have any questions or concerns prior to her return visit.   FOLLOW UP PLAN:  Return in about 3 months (around 02/27/2024) for Previsit labs, Thyroid  follow up.  Benton Rio, [facility] [address] [place], [place] [address] Phone: [phone] Fax: [phone]  11/27/2023, 2:17 PM

## 2023-12-02 ENCOUNTER — Inpatient Hospital Stay: Attending: Hematology

## 2023-12-02 DIAGNOSIS — D509 Iron deficiency anemia, unspecified: Secondary | ICD-10-CM | POA: Insufficient documentation

## 2023-12-02 DIAGNOSIS — Z79899 Other long term (current) drug therapy: Secondary | ICD-10-CM | POA: Insufficient documentation

## 2023-12-04 ENCOUNTER — Inpatient Hospital Stay

## 2023-12-04 ENCOUNTER — Encounter: Payer: Self-pay | Admitting: Family Medicine

## 2023-12-04 ENCOUNTER — Ambulatory Visit: Payer: Self-pay

## 2023-12-04 ENCOUNTER — Ambulatory Visit (INDEPENDENT_AMBULATORY_CARE_PROVIDER_SITE_OTHER): Admitting: Family Medicine

## 2023-12-04 VITALS — BP 114/89 | HR 87 | Temp 98.4°F | Ht 67.0 in | Wt 292.0 lb

## 2023-12-04 DIAGNOSIS — Z79899 Other long term (current) drug therapy: Secondary | ICD-10-CM | POA: Diagnosis not present

## 2023-12-04 DIAGNOSIS — D509 Iron deficiency anemia, unspecified: Secondary | ICD-10-CM | POA: Diagnosis present

## 2023-12-04 DIAGNOSIS — D500 Iron deficiency anemia secondary to blood loss (chronic): Secondary | ICD-10-CM

## 2023-12-04 DIAGNOSIS — R1032 Left lower quadrant pain: Secondary | ICD-10-CM | POA: Diagnosis not present

## 2023-12-04 DIAGNOSIS — R1031 Right lower quadrant pain: Secondary | ICD-10-CM | POA: Diagnosis not present

## 2023-12-04 DIAGNOSIS — R110 Nausea: Secondary | ICD-10-CM | POA: Diagnosis not present

## 2023-12-04 DIAGNOSIS — D75839 Thrombocytosis, unspecified: Secondary | ICD-10-CM

## 2023-12-04 LAB — IRON AND TIBC
Iron: 57 ug/dL (ref 28–170)
Saturation Ratios: 17 % (ref 10.4–31.8)
TIBC: 340 ug/dL (ref 250–450)
UIBC: 283 ug/dL

## 2023-12-04 LAB — CBC WITH DIFFERENTIAL/PLATELET
Abs Immature Granulocytes: 0.03 K/uL (ref 0.00–0.07)
Basophils Absolute: 0.1 K/uL (ref 0.0–0.1)
Basophils Relative: 1 %
Eosinophils Absolute: 0.4 K/uL (ref 0.0–0.5)
Eosinophils Relative: 4 %
HCT: 42 % (ref 36.0–46.0)
Hemoglobin: 14 g/dL (ref 12.0–15.0)
Immature Granulocytes: 0 %
Lymphocytes Relative: 37 %
Lymphs Abs: 3.2 K/uL (ref 0.7–4.0)
MCH: 30.6 pg (ref 26.0–34.0)
MCHC: 33.3 g/dL (ref 30.0–36.0)
MCV: 91.7 fL (ref 80.0–100.0)
Monocytes Absolute: 0.6 K/uL (ref 0.1–1.0)
Monocytes Relative: 7 %
Neutro Abs: 4.3 K/uL (ref 1.7–7.7)
Neutrophils Relative %: 51 %
Platelets: 394 K/uL (ref 150–400)
RBC: 4.58 MIL/uL (ref 3.87–5.11)
RDW: 13.2 % (ref 11.5–15.5)
WBC: 8.6 K/uL (ref 4.0–10.5)
nRBC: 0 % (ref 0.0–0.2)

## 2023-12-04 LAB — FERRITIN: Ferritin: 30 ng/mL (ref 11–307)

## 2023-12-04 MED ORDER — OXYCODONE-ACETAMINOPHEN 5-325 MG PO TABS
1.0000 | ORAL_TABLET | Freq: Four times a day (QID) | ORAL | 0 refills | Status: AC | PRN
Start: 2023-12-04 — End: 2023-12-09

## 2023-12-04 MED ORDER — KETOROLAC TROMETHAMINE 60 MG/2ML IM SOLN
60.0000 mg | Freq: Once | INTRAMUSCULAR | Status: AC
  Administered 2023-12-04: 60 mg via INTRAMUSCULAR

## 2023-12-04 MED ORDER — ONDANSETRON HCL 4 MG PO TABS: 4.0000 mg | ORAL_TABLET | Freq: Three times a day (TID) | ORAL | 0 refills | Status: AC | PRN

## 2023-12-04 NOTE — Patient Instructions (Addendum)
 I appreciate the opportunity to provide care to you today!   Labs: please stop by the lab today to get your blood drawn (CBC, CMP)  Lower Left Quadrant Pain Labs: Order CBC, CMP, and urinalysis to evaluate for infection, electrolyte abnormalities, or urinary tract involvement.  Imaging: Please stop by [facility] for an abdominal and pelvic ultrasound to assess for appendicitis, ovarian pathology (e.g., cyst, torsion), or other intra-abdominal causes.  Medications: start taking percocet 5-325 mg every 6 hours as needed  Please start taking prescribe antiemetic (e.g., ondansetron  4 mg ODT as needed) for nausea.  Hydration: Encourage increased oral fluid intake   Monitoring: Please monitor for worsening pain, fever, vomiting, or changes in bowel or urinary habits.  Please go to the  ED if signs of acute abdomen develop (e.g., rebound tenderness, guarding, high fever).   Please follow up if your symptoms worsen or fail to improve.   Please continue to a heart-healthy diet and increase your physical activities. Try to exercise for at least five days a week.    It was a pleasure to see you and I look forward to continuing to work together on your health and well-being. Please do not hesitate to call the office if you need care or have questions about your care.  In case of emergency, please visit the Emergency Department for urgent care, or contact our clinic at [phone] to schedule an appointment. We're here to help you!   Have a wonderful day and week. With Gratitude, Finola Rosal MSN, FNP-BC'

## 2023-12-04 NOTE — Assessment & Plan Note (Signed)
 Toradol  60 mg IM was administered in the clinic. CBC and CMP are pending. Urinalysis was negative for UTI. The patient is encouraged to stop by [facility] for an abdominal and pelvic ultrasound to assess for appendicitis, ovarian pathology (e.g., cyst, torsion), or other intra-abdominal causes. She is encouraged to start taking Percocet 5-325 mg every 6 hours as needed for pain. She is also encouraged to take the prescribed antiemetic (ondansetron  4 mg ODT) as needed for nausea. Increased oral fluid intake is advised. The patient was instructed to report to the emergency department if signs of acute abdomen develop (e.g., rebound tenderness, guarding, or high fever).

## 2023-12-04 NOTE — Progress Notes (Addendum)
 Acute Office Visit  Subjective:    Patient ID: Kristin Baker, female    DOB: 1990/08/25, 33 y.o.   MRN: [id number]  Chief Complaint  Patient presents with   Abdominal Pain     Lower abdominal pain x2 days  LRQ, she stated that is moving to lower back. Some vomiting on Tues. She taking otc pains medication. No changes in bowl movement , she stated urination hasn't change. No chills or body aches.    HPI The patient presents with complaints of left lower quadrant abdominal pain rated 7/10, which began on Tuesday. She describes the pain as feeling like "a gut punch from the inside." She reports a history of an ovarian cyst rupture approximately 15 years ago and states this pain feels similar. The pain worsens when lying down or drinking fluids and improves when leaning to the right. She is unable to lie on her left side due to discomfort. She denies any changes in bowel or bladder habits. No associated , vomiting, fever, or chills. The patient had her gallbladder removed in September 2024, with her last bowel movement occurring at 4:00 a.m. today.   Past Medical History:  Diagnosis Date   Abdominal pain    ADHD    ANA positive    Back pain    DDD (degenerative disc disease), lumbar    Eye pain, right    Fatigue    Frontal sinusitis    Hypertension    Hypothyroidism    Iron deficiency anemia due to chronic blood loss 06/19/2022   Medical history non-contributory    Obesity, morbid (HCC)    Palpitations    Rheumatoid arthritis [facility])     Past Surgical History:  Procedure Laterality Date   ABLATION     05/22/2020 left side, 06/14/2020 right side    BIOPSY  02/17/2023   Procedure: BIOPSY;  Surgeon: Cinderella Deatrice FALCON, MD;  Location: [facility];  Service: Endoscopy;;   CHOLECYSTECTOMY     COLONOSCOPY WITH PROPOFOL  N/A 02/17/2023   Procedure: COLONOSCOPY WITH PROPOFOL ;  Surgeon: Cinderella Deatrice FALCON, MD;  Location: [facility];  Service: Endoscopy;  Laterality: N/A;  9:45am;asa  2   ESOPHAGOGASTRODUODENOSCOPY (EGD) WITH PROPOFOL  N/A 02/17/2023   Procedure: ESOPHAGOGASTRODUODENOSCOPY (EGD) WITH PROPOFOL ;  Surgeon: Cinderella Deatrice FALCON, MD;  Location: [facility];  Service: Endoscopy;  Laterality: N/A;  9:45am;asa 2   HYSTERECTOMY ABDOMINAL WITH SALPINGECTOMY     POLYPECTOMY  02/17/2023   Procedure: POLYPECTOMY;  Surgeon: Cinderella Deatrice FALCON, MD;  Location: [facility];  Service: Endoscopy;;   ROBOTIC ASSISTED TOTAL HYSTERECTOMY Bilateral 07/30/2022   Procedure: XI ROBOTIC ASSISTED TOTAL HYSTERECTOMY AND BILATERAL SALPINGECTOMY;  Surgeon: Jayne Vonn DEL, MD;  Location: [facility];  Service: Gynecology;  Laterality: Bilateral;    Family History  Problem Relation Age of Onset   Cervical cancer Maternal Grandmother    Breast cancer Maternal Grandmother    Skin cancer Maternal Grandmother    Heart attack Maternal Grandfather    Diabetes Father    Heart attack Father    Hyperlipidemia Mother    Peripheral Artery Disease Mother    ADD / ADHD Mother    Bipolar disorder Mother    Cervical cancer Mother        at age 6.   ADD / ADHD Sister    Healthy Daughter    Breast cancer Maternal Aunt    Cervical cancer Maternal Aunt    ADD / ADHD Maternal Aunt  Uterine cancer Maternal Aunt    Breast cancer Maternal Aunt    Multiple sclerosis Paternal Aunt    Colon cancer Neg Hx     Social History   Socioeconomic History   Marital status: Married    Spouse name: Not on file   Number of children: 1   Years of education: Not on file   Highest education level: Associate degree: academic program  Occupational History   Not on file  Tobacco Use   Smoking status: Former    Current packs/day: 0.00    Average packs/day: 1 pack/day for 15.0 years (15.0 ttl pk-yrs)    Types: Cigarettes, E-cigarettes    Start date: 11/24/2005    Quit date: 11/24/2020    Years since quitting: 3.0    Passive exposure: Current   Smokeless tobacco: Never   Tobacco comments:    She has quit of  and on.  Vaping Use   Vaping status: Some Days  Substance and Sexual Activity   Alcohol use: Yes    Comment: occasionally /social events   Drug use: Not Currently    Comment: smoked weed during teen years   Sexual activity: Not Currently    Birth control/protection: Surgical    Comment: hyst  Other Topics Concern   Not on file  Social History Narrative   Married for May 2021.Lives with husband and daughter.Homemaker.Husband works at [employer].   Social Drivers of Corporate investment banker Strain: Low Risk  (09/23/2022)   Overall Financial Resource Strain (CARDIA)    Difficulty of Paying Living Expenses: Not very hard  Food Insecurity: Low Risk  (03/01/2023)   Received from [facility]   Hunger Vital Sign    Within the past 12 months, you worried that your food would run out before you got money to buy more: Never true    Within the past 12 months, the food you bought just didn't last and you didn't have money to get more. : Never true  Transportation Needs: No Transportation Needs (03/01/2023)   Received from [facility]    In the past 12 months, has lack of reliable transportation kept you from medical appointments, meetings, work or from getting things needed for daily living? : No  Physical Activity: Unknown (09/23/2022)   Exercise Vital Sign    Days of Exercise per Week: Patient declined    Minutes of Exercise per Session: Not on file  Stress: Stress Concern Present (09/23/2022)   [employer] of Occupational Health - Occupational Stress Questionnaire    Feeling of Stress : Very much  Social Connections: Moderately Isolated (09/23/2022)   Social Connection and Isolation Panel    Frequency of Communication with Friends and Family: More than three times a week    Frequency of Social Gatherings with Friends and Family: Twice a week    Attends Religious Services: Never    Database administrator or Organizations: No    Attends Museum/gallery exhibitions officer: Not on file    Marital Status: Married  Catering manager Violence: Not At Risk (02/27/2023)   Humiliation, Afraid, Rape, and Kick questionnaire    Fear of Current or Ex-Partner: No    Emotionally Abused: No    Physically Abused: No    Sexually Abused: No    Outpatient Medications Prior to Visit  Medication Sig Dispense Refill   acetaminophen  (TYLENOL ) 500 MG tablet Take 2 tablets (1,000 mg total) by mouth every 6 (six) hours.  30 tablet 0   albuterol  (VENTOLIN  HFA) 108 (90 Base) MCG/ACT inhaler Inhale 2 puffs into the lungs every 6 (six) hours as needed for wheezing or shortness of breath. 18 g 1   amphetamine -dextroamphetamine  (ADDERALL XR) 20 MG 24 hr capsule Take 20 mg by mouth daily.     amphetamine -dextroamphetamine  (ADDERALL) 5 MG tablet Take 5 mg by mouth daily in the afternoon.     Fremanezumab -vfrm (AJOVY ) 225 MG/1.5ML SOAJ Inject 225 mg into the skin every 30 (thirty) days. 4.5 mL 3   levocetirizine (XYZAL ) 5 MG tablet Take 1 tablet (5 mg total) by mouth daily. 30 tablet 2   levothyroxine  (SYNTHROID ) 150 MCG tablet Take 1 tablet (150 mcg total) by mouth daily before breakfast. 90 tablet 1   lidocaine  (XYLOCAINE ) 2 % solution Use as directed 15 mLs in the mouth or throat every 3 (three) hours as needed for mouth pain. Apply topically to affected areas as needed for dental pain 100 mL 0   montelukast  (SINGULAIR ) 10 MG tablet Take 1 tablet (10 mg total) by mouth at bedtime. 30 tablet 3   olmesartan -hydrochlorothiazide  (BENICAR  HCT) 20-12.5 MG tablet Take 1 tablet by mouth daily. 90 tablet 1   tiZANidine  (ZANAFLEX ) 4 MG tablet Take 1 tablet (4 mg total) by mouth at bedtime. 60 tablet 1   Vitamin D , Ergocalciferol , (DRISDOL ) 1.25 MG (50000 UNIT) CAPS capsule Take 1 capsule (50,000 Units total) by mouth every 7 (seven) days. 20 capsule 1   FLUoxetine  (PROZAC ) 20 MG capsule Take 20 mg by mouth daily.     mirtazapine (REMERON) 7.5 MG tablet 1 tablet at bedtime Orally  Once a day for 30 days     No facility-administered medications prior to visit.    Allergies  Allergen Reactions   Poison Ivy Extract Anaphylaxis   Poison Oak Extract Anaphylaxis   Anser Anser Feather (Goose) Allergy Skin Test    Flexeril [Cyclobenzaprine] Hives   Lactose Intolerance (Gi) Diarrhea   Honeysuckle Flower [Lonicera] Rash   Mixed Grasses Rash    Review of Systems  Constitutional:  Negative for chills and fever.  Eyes:  Negative for visual disturbance.  Respiratory:  Negative for chest tightness and shortness of breath.   Gastrointestinal:  Positive for abdominal pain (LLQ pain).  Neurological:  Negative for dizziness and headaches.       Objective:    Physical Exam HENT:     Head: Normocephalic.     Mouth/Throat:     Mouth: Mucous membranes are moist.  Cardiovascular:     Rate and Rhythm: Normal rate.     Heart sounds: Normal heart sounds.  Pulmonary:     Effort: Pulmonary effort is normal.     Breath sounds: Normal breath sounds.  Abdominal:     Tenderness: There is abdominal tenderness in the left lower quadrant. There is no guarding. Negative signs include Murphy's sign and Rovsing's sign.  Neurological:     Mental Status: She is alert.     BP 114/89   Pulse 87   Temp 98.4 F (36.9 C) (Oral)   Ht 5' 7 (1.702 m)   Wt 292 lb (132.5 kg)   LMP 03/10/2022 (Approximate)   SpO2 95%   BMI 45.73 kg/m  Wt Readings from Last 3 Encounters:  12/04/23 292 lb (132.5 kg)  11/27/23 292 lb 6.4 oz (132.6 kg)  08/29/23 284 lb (128.8 kg)       Assessment & Plan:  Left lower quadrant pain Assessment & Plan:  Toradol  60 mg IM was administered in the clinic. CBC and CMP are pending. Urinalysis was negative for UTI. The patient is encouraged to stop by [facility] for an abdominal and pelvic ultrasound to assess for appendicitis, ovarian pathology (e.g., cyst, torsion), or other intra-abdominal causes. She is encouraged to start taking Percocet 5-325  mg every 6 hours as needed for pain. She is also encouraged to take the prescribed antiemetic (ondansetron  4 mg ODT) as needed for nausea. Increased oral fluid intake is advised. The patient was instructed to report to the emergency department if signs of acute abdomen develop (e.g., rebound tenderness, guarding, or high fever).   Orders: -     US  Abdomen Complete -     CBC with Differential/Platelet -     CMP14+EGFR -     Urinalysis -     Ketorolac  Tromethamine  -     oxyCODONE -Acetaminophen ; Take 1 tablet by mouth every 6 (six) hours as needed for up to 5 days for severe pain (pain score 7-10).  Dispense: 15 tablet; Refill: 0 -     US  PELVIC COMPLETE WITH TRANSVAGINAL  Nausea -     Ondansetron  HCl; Take 1 tablet (4 mg total) by mouth every 8 (eight) hours as needed for nausea or vomiting.  Dispense: 20 tablet; Refill: 0  Note: This chart has been completed using Engineer, civil (consulting) software, and while attempts have been made to ensure accuracy, certain words and phrases may not be transcribed as intended.    Valeen Borys, FNP

## 2023-12-04 NOTE — Telephone Encounter (Signed)
 Copied from CRM [id number]. Topic: Clinical - Red Word Triage >> Dec 04, 2023  9:32 AM Powell HERO wrote: Red Word that prompted transfer to Nurse Triage:  Thinks she had an ovarian cyst that has ruptured, currently in pain and it seems to be getting worse. Pain in vagina. Looking for something to help the pain. Does not want to go to ER.   FYI Only or Action Required?: FYI only for provider.  Patient was last seen in primary care on 08/29/2023 by Zarwolo, Gloria, FNP.  Called Nurse Triage reporting Abdominal Pain.  Symptoms began 2 days ago.  Symptoms are: gradually worsening.  Triage Disposition: See HCP Within 4 Hours (Or PCP Triage)  Patient/caregiver understands and will follow disposition?: Yes   Reason for Disposition  [1] MILD-MODERATE pain AND [2] constant AND [3] present > 2 hours  Answer Assessment - Initial Assessment Questions 1. LOCATION: Where does it hurt?      Left lower abdomen  2. RADIATION: Does the pain shoot anywhere else? (e.g., chest, back)     Radiates to back and to groin 3. ONSET: When did the pain begin? (e.g., minutes, hours or days ago)      2 days ago 4. SUDDEN: Gradual or sudden onset?     Sudden 5. PATTERN Does the pain come and go, or is it constant?     Constant  6. SEVERITY: How bad is the pain?  (e.g., Scale 1-10; mild, moderate, or severe)     7/10 7. RECURRENT SYMPTOM: Have you ever had this type of stomach pain before? If Yes, ask: When was the last time? and What happened that time?      Yes with previous ovarian cyst rupture  8. CAUSE: What do you think is causing the stomach pain? (e.g., gallstones, recent abdominal surgery)     Believes an ovarian cyst ruptured  9. RELIEVING/AGGRAVATING FACTORS: What makes it better or worse? (e.g., antacids, bending or twisting motion, bowel movement)     No 10. OTHER SYMPTOMS: Do you have any other symptoms? (e.g., back pain, diarrhea, fever, urination pain, vomiting)        No  Protocols used: Abdominal Pain - Female-A-AH

## 2023-12-05 ENCOUNTER — Ambulatory Visit: Payer: Self-pay | Admitting: Family Medicine

## 2023-12-05 ENCOUNTER — Ambulatory Visit

## 2023-12-05 LAB — CMP14+EGFR
ALT: 16 IU/L (ref 0–32)
AST: 13 IU/L (ref 0–40)
Albumin: 4.1 g/dL (ref 3.9–4.9)
Alkaline Phosphatase: 84 IU/L (ref 44–121)
BUN/Creatinine Ratio: 5 — ABNORMAL LOW (ref 9–23)
BUN: 4 mg/dL — ABNORMAL LOW (ref 6–20)
Bilirubin Total: 0.3 mg/dL (ref 0.0–1.2)
CO2: 22 mmol/L (ref 20–29)
Calcium: 9.3 mg/dL (ref 8.7–10.2)
Chloride: 102 mmol/L (ref 96–106)
Creatinine, Ser: 0.77 mg/dL (ref 0.57–1.00)
Globulin, Total: 3.2 g/dL (ref 1.5–4.5)
Glucose: 79 mg/dL (ref 70–99)
Potassium: 4.2 mmol/L (ref 3.5–5.2)
Sodium: 139 mmol/L (ref 134–144)
Total Protein: 7.3 g/dL (ref 6.0–8.5)
eGFR: 104 mL/min/1.73 (ref >59)

## 2023-12-05 LAB — CBC WITH DIFFERENTIAL/PLATELET
Basophils Absolute: 0.1 x10E3/uL (ref 0.0–0.2)
Basos: 1 %
EOS (ABSOLUTE): 0.2 x10E3/uL (ref 0.0–0.4)
Eos: 3 %
Hematocrit: 42.5 % (ref 34.0–46.6)
Hemoglobin: 13.9 g/dL (ref 11.1–15.9)
Immature Grans (Abs): 0 x10E3/uL (ref 0.0–0.1)
Immature Granulocytes: 0 %
Lymphocytes Absolute: 2.8 x10E3/uL (ref 0.7–3.1)
Lymphs: 34 %
MCH: 30.7 pg (ref 26.6–33.0)
MCHC: 32.7 g/dL (ref 31.5–35.7)
MCV: 94 fL (ref 79–97)
Monocytes Absolute: 0.5 x10E3/uL (ref 0.1–0.9)
Monocytes: 5 %
Neutrophils Absolute: 4.7 x10E3/uL (ref 1.4–7.0)
Neutrophils: 57 %
Platelets: 407 x10E3/uL (ref 150–450)
RBC: 4.53 x10E6/uL (ref 3.77–5.28)
RDW: 12.7 % (ref 11.7–15.4)
WBC: 8.3 x10E3/uL (ref 3.4–10.8)

## 2023-12-05 NOTE — Progress Notes (Signed)
 Please inform the patient that her labs are stable

## 2023-12-05 NOTE — Addendum Note (Signed)
 Addended by: Shervon Kerwin on: 12/05/2023 11:40 PM   Modules accepted: Orders

## 2023-12-06 LAB — URINALYSIS
Bilirubin, UA: NEGATIVE
Glucose, UA: NEGATIVE
Ketones, UA: NEGATIVE
Leukocytes,UA: NEGATIVE
Nitrite, UA: NEGATIVE
Protein,UA: NEGATIVE
RBC, UA: NEGATIVE
Specific Gravity, UA: 1.02 (ref 1.005–1.030)
Urobilinogen, Ur: 0.2 mg/dL (ref 0.2–1.0)
pH, UA: 7 (ref 5.0–7.5)

## 2023-12-08 DIAGNOSIS — H40013 Open angle with borderline findings, low risk, bilateral: Secondary | ICD-10-CM | POA: Diagnosis not present

## 2023-12-08 DIAGNOSIS — H5213 Myopia, bilateral: Secondary | ICD-10-CM | POA: Diagnosis not present

## 2023-12-08 NOTE — Progress Notes (Unsigned)
 [facility] [address], [place] [address]   CLINIC:  Medical Oncology/Hematology  PCP:  Zarwolo, Gloria, FNP [address] 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 Last seen by Pleasant Barefoot PA-C 07/28/2023. Last IV iron with Feraheme  x 2 in December 2024. Interim changes include being diagnosed with Hashimoto's thyroiditis.  She continues to have ongoing fatigue, which has worsened over the past few months. No dyspnea, chest pain, lightheadedness, or headaches.   She has aquagenic pruritus and Raynaud's phenomenon. No history of DVT or PE.  She has bilateral leg swelling, tends to be worse in the summer; left slightly more swollen than right, but without any significant appreciable difference on exam. She continues to vape daily.   She no longer has periods, as she is s/p total abdominal hysterectomy in March 2024.   She previously had scant rectal bleeding associated with constipation, but has not noticed this recently. No gross hematochezia or melena.  She has not taken iron pill for the past month.    She has 30% energy and 100% appetite. She endorses that she is maintaining a stable weight.  ASSESSMENT & PLAN:  1.   Thrombocytosis - Progressively elevated platelets since February 2022. Labs from 05/24/2022 show platelets 607 in the context of borderline anemia (Hgb 10.4) and borderline microcytosis (MCV 80). Iron studies (05/06/2022) show iron deficiency with ferritin 18 and iron saturation 5%. - She vapes daily. - She is morbidly obese. - She is being worked up by rheumatology for possible autoimmune disease (thoracic spine pain, history of previous ANA positive in 2021). - JAK2 with reflex to CALR,  MPL, and E12-15 was NEGATIVE.  Hematology workup showed negative ANA and rheumatoid factor, but with elevated ESR and CRP. - No history of DVT or PE. - No B symptoms.  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 - Most recent labs (12/04/2023): Platelets normalized to 394.  Ferritin 30/iron saturation 17% - DIFFERENTIAL DIAGNOSIS favors reactive thrombocytosis in the setting of iron deficiency, vaping, obesity, and possible autoimmune/inflammatory disease. - PLAN: No evidence of MPN or clonal thrombocytosis at this time.  Continue monitoring with periodic CBCs.  Continue treatment of iron deficiency as below.  Continue to encourage the patient to stop vaping.   2.  Iron deficiency anemia - Total abdominal hysterectomy in March 2024 due to severe abnormal uterine bleeding - Severe anemia in December 2023 with Hgb down to 7.7. - EGD (02/17/2023): Normal esophagus, gastritis.  Scalloped mucosa in duodenum (pathology benign with no significant pathologic changes). - Colonoscopy (02/17/2023): Polyps x 3 (hyperplastic polyps, no dysplasia or malignancy).  Nonbleeding external and internal hemorrhoids. - Intermittent mild hemorrhoid bleeding - Previously donated blood regularly, no blood donation for the past year. - No prior history of blood transfusion. - Taking iron tablet daily since November 2023.  Most recent IV iron with Feraheme  x 2 in December 2024.  Reports energy improves after IV iron. - Most recent labs (12/04/2023): Hgb 14.0/MCV 91.7, ferritin 30, iron saturation 17% - PLAN:  IV Feraheme  x 2 due to symptomatic iron deficiency.  - Continue daily iron supplement - Repeat CBC/D, ferritin, iron/TIBC in 6 months followed by OFFICE visit   3.  Family history - Strong family history of  breast, uterine, and ovarian cancer on her mother's side of the family: Mother had endometrial cancer Maternal aunt with breast cancer Maternal aunt with ovarian cancer and breast cancer Maternal  grandmother with breast cancer and uterine cancer.  - PLAN: Patient was referred to genetic counselor, but did not show up for appointment.       4.  Other history - PMH: Abnormal uterine bleeding, ADHD, PCOS, degenerative disc disease, hypertension, hypothyroidism, asthma, morbid obesity, anxiety/depression - SOCIAL: She lives at home with her husband and daughter.  She is currently unemployed.  She is a former smoker, switched to vaping 2 years ago and vapes frequently throughout the day.  She drinks occasional alcohol.  She denies any illicit drug use. - FAMILY: Strong family history of breast, uterine, and ovarian cancer on mother side of the family, as noted above.   PLAN SUMMARY: >> Feraheme  x 2 >> Labs in 6 months = CBC/D, ferritin, iron/TIBC >> OFFICE visit in 6 months     REVIEW OF SYSTEMS:  Review of Systems  Constitutional:  Positive for fatigue (recently diagnosed with Hashimoto's thyroiditis). Negative for appetite change, chills, diaphoresis, fever and unexpected weight change.  HENT:   Negative for lump/mass and nosebleeds.   Eyes:  Negative for eye problems.  Respiratory:  Negative for cough, hemoptysis and shortness of breath.   Cardiovascular:  Negative for chest pain, leg swelling and palpitations.  Gastrointestinal:  Positive for abdominal pain (LLQ, being worked up by PCP), nausea and vomiting. Negative for blood in stool, constipation and diarrhea.  Endocrine: Negative for hot flashes.  Genitourinary:  Negative for hematuria.   Musculoskeletal:  Negative for arthralgias and back pain.  Skin: Negative.   Neurological:  Negative for dizziness, headaches and light-headedness.  Hematological:  Does not bruise/bleed easily.     PHYSICAL EXAM:  ECOG PERFORMANCE STATUS: 1 - Symptomatic but completely ambulatory  Vitals:   12/09/23 0906  BP: (!) 156/85  Pulse: 62  Resp: 16  Temp: 99.1 F (37.3 C)  SpO2: 99%    Filed Weights   12/09/23 0906  Weight: 295 lb  13.7 oz (134.2 kg)    Physical Exam Constitutional:      Appearance: Normal appearance. She is morbidly obese.  Cardiovascular:     Heart sounds: Normal heart sounds.  Pulmonary:     Breath sounds: Normal breath sounds.  Musculoskeletal:     Right lower leg: Edema (non-pitting) present.     Left lower leg: Edema (non-pitting) present.  Neurological:     General: No focal deficit present.     Mental Status: Mental status is at baseline.  Psychiatric:        Behavior: Behavior normal. Behavior is cooperative.     PAST MEDICAL/SURGICAL HISTORY:  Past Medical History:  Diagnosis Date   Abdominal pain    ADHD    ANA positive    Back pain    DDD (degenerative disc disease), lumbar    Eye pain, right    Fatigue    Frontal sinusitis    Hypertension    Hypothyroidism    Iron deficiency anemia due to chronic blood loss 06/19/2022   Medical history non-contributory    Obesity, morbid (HCC)    Palpitations    Rheumatoid arthritis [facility])    Past Surgical History:  Procedure Laterality Date   ABLATION     05/22/2020 left side, 06/14/2020 right side    BIOPSY  02/17/2023   Procedure: BIOPSY;  Surgeon: Cinderella Deatrice FALCON,  MD;  [facility];  Service: Endoscopy;;   CHOLECYSTECTOMY     COLONOSCOPY WITH PROPOFOL  N/A 02/17/2023   Procedure: COLONOSCOPY WITH PROPOFOL ;  Surgeon: Cinderella Deatrice FALCON, MD;  [facility]: [facility];  Service: Endoscopy;  Laterality: N/A;  9:45am;asa 2   ESOPHAGOGASTRODUODENOSCOPY (EGD) WITH PROPOFOL  N/A 02/17/2023   Procedure: ESOPHAGOGASTRODUODENOSCOPY (EGD) WITH PROPOFOL ;  Surgeon: Cinderella Deatrice FALCON, MD;  [facility];  Service: Endoscopy;  Laterality: N/A;  9:45am;asa 2   HYSTERECTOMY ABDOMINAL WITH SALPINGECTOMY     POLYPECTOMY  02/17/2023   Procedure: POLYPECTOMY;  Surgeon: Cinderella Deatrice FALCON, MD;  [facility];  Service: Endoscopy;;   ROBOTIC ASSISTED TOTAL HYSTERECTOMY Bilateral 07/30/2022   Procedure: XI ROBOTIC  ASSISTED TOTAL HYSTERECTOMY AND BILATERAL SALPINGECTOMY;  Surgeon: Jayne Vonn DEL, MD;  Location: [facility];  Service: Gynecology;  Laterality: Bilateral;    SOCIAL HISTORY:  Social History   Socioeconomic History   Marital status: Married    Spouse name: Not on file   Number of children: 1   Years of education: Not on file   Highest education level: Associate degree: academic program  Occupational History   Not on file  Tobacco Use   Smoking status: Former    Current packs/day: 0.00    Average packs/day: 1 pack/day for 15.0 years (15.0 ttl pk-yrs)    Types: Cigarettes, E-cigarettes    Start date: 11/24/2005    Quit date: 11/24/2020    Years since quitting: 3.0    Passive exposure: Current   Smokeless tobacco: Never   Tobacco comments:    She has quit of and on.  Vaping Use   Vaping status: Some Days  Substance and Sexual Activity   Alcohol use: Yes    Comment: occasionally /social events   Drug use: Not Currently    Comment: smoked weed during teen years   Sexual activity: Not Currently    Birth control/protection: Surgical    Comment: hyst  Other Topics Concern   Not on file  Social History Narrative   Married for May 2021.Lives with husband and daughter.Homemaker.Husband works at [employer].   Social Drivers of Corporate investment banker Strain: Low Risk  (09/23/2022)   Overall Financial Resource Strain (CARDIA)    Difficulty of Paying Living Expenses: Not very hard  Food Insecurity: Low Risk  (03/01/2023)   Received from [facility]   Hunger Vital Sign    Within the past 12 months, you worried that your food would run out before you got money to buy more: Never true    Within the past 12 months, the food you bought just didn't last and you didn't have money to get more. : Never true  Transportation Needs: No Transportation Needs (03/01/2023)   Received from [facility]    In the past 12 months, has lack of reliable transportation kept  you from medical appointments, meetings, work or from getting things needed for daily living? : No  Physical Activity: Unknown (09/23/2022)   Exercise Vital Sign    Days of Exercise per Week: Patient declined    Minutes of Exercise per Session: Not on file  Stress: Stress Concern Present (09/23/2022)   [employer] of Occupational Health - Occupational Stress Questionnaire    Feeling of Stress : Very much  Social Connections: Moderately Isolated (09/23/2022)   Social Connection and Isolation Panel    Frequency of Communication with Friends and Family: More than three times  a week    Frequency of Social Gatherings with Friends and Family: Twice a week    Attends Religious Services: Never    Database administrator or Organizations: No    Attends Engineer, structural: Not on file    Marital Status: Married  Catering manager Violence: Not At Risk (02/27/2023)   Humiliation, Afraid, Rape, and Kick questionnaire    Fear of Current or Ex-Partner: No    Emotionally Abused: No    Physically Abused: No    Sexually Abused: No    FAMILY HISTORY:  Family History  Problem Relation Age of Onset   Cervical cancer Maternal Grandmother    Breast cancer Maternal Grandmother    Skin cancer Maternal Grandmother    Heart attack Maternal Grandfather    Diabetes Father    Heart attack Father    Hyperlipidemia Mother    Peripheral Artery Disease Mother    ADD / ADHD Mother    Bipolar disorder Mother    Cervical cancer Mother        at age 54.   ADD / ADHD Sister    Healthy Daughter    Breast cancer Maternal Aunt    Cervical cancer Maternal Aunt    ADD / ADHD Maternal Aunt    Uterine cancer Maternal Aunt    Breast cancer Maternal Aunt    Multiple sclerosis Paternal Aunt    Colon cancer Neg Hx     CURRENT MEDICATIONS:  Outpatient Encounter Medications as of 12/09/2023  Medication Sig   acetaminophen  (TYLENOL ) 500 MG tablet Take 2 tablets (1,000 mg total) by mouth every 6 (six)  hours.   albuterol  (VENTOLIN  HFA) 108 (90 Base) MCG/ACT inhaler Inhale 2 puffs into the lungs every 6 (six) hours as needed for wheezing or shortness of breath.   amphetamine -dextroamphetamine  (ADDERALL XR) 20 MG 24 hr capsule Take 20 mg by mouth daily.   amphetamine -dextroamphetamine  (ADDERALL) 5 MG tablet Take 5 mg by mouth daily in the afternoon.   Fremanezumab -vfrm (AJOVY ) 225 MG/1.5ML SOAJ Inject 225 mg into the skin every 30 (thirty) days.   levocetirizine (XYZAL ) 5 MG tablet Take 1 tablet (5 mg total) by mouth daily.   levothyroxine  (SYNTHROID ) 150 MCG tablet Take 1 tablet (150 mcg total) by mouth daily before breakfast.   lidocaine  (XYLOCAINE ) 2 % solution Use as directed 15 mLs in the mouth or throat every 3 (three) hours as needed for mouth pain. Apply topically to affected areas as needed for dental pain   montelukast  (SINGULAIR ) 10 MG tablet Take 1 tablet (10 mg total) by mouth at bedtime.   olmesartan -hydrochlorothiazide  (BENICAR  HCT) 20-12.5 MG tablet Take 1 tablet by mouth daily.   ondansetron  (ZOFRAN ) 4 MG tablet Take 1 tablet (4 mg total) by mouth every 8 (eight) hours as needed for nausea or vomiting.   oxyCODONE -acetaminophen  (PERCOCET/ROXICET) 5-325 MG tablet Take 1 tablet by mouth every 6 (six) hours as needed for up to 5 days for severe pain (pain score 7-10).   tiZANidine  (ZANAFLEX ) 4 MG tablet Take 1 tablet (4 mg total) by mouth at bedtime.   Vitamin D , Ergocalciferol , (DRISDOL ) 1.25 MG (50000 UNIT) CAPS capsule Take 1 capsule (50,000 Units total) by mouth every 7 (seven) days.   No facility-administered encounter medications on file as of 12/09/2023.    ALLERGIES:  Allergies  Allergen Reactions   Poison Ivy Extract Anaphylaxis   Poison Oak Extract Anaphylaxis   Anser Anser Feather (Goose) Allergy Skin Test  Flexeril [Cyclobenzaprine] Hives   Lactose Intolerance (Gi) Diarrhea   Honeysuckle Flower [Lonicera] Rash   Mixed Grasses Rash    LABORATORY DATA:  I have  reviewed the labs as listed.  CBC    Component Value Date/Time   WBC 8.3 12/04/2023 1127   WBC 8.6 12/04/2023 0751   RBC 4.53 12/04/2023 1127   RBC 4.58 12/04/2023 0751   HGB 13.9 12/04/2023 1127   HGB 14.0 12/04/2023 0751   HCT 42.5 12/04/2023 1127   HCT 42.0 12/04/2023 0751   PLT 407 12/04/2023 1127   PLT 394 12/04/2023 0751   MCV 94 12/04/2023 1127   MCV 91.7 12/04/2023 0751   MCH 30.7 12/04/2023 1127   MCH 30.6 12/04/2023 0751   MCHC 32.7 12/04/2023 1127   MCHC 33.3 12/04/2023 0751   RDW 12.7 12/04/2023 1127   RDW 13.2 12/04/2023 0751   LYMPHSABS 2.8 12/04/2023 1127   LYMPHSABS 3.2 12/04/2023 0751   MONOABS 0.6 12/04/2023 0751   EOSABS 0.2 12/04/2023 1127   EOSABS 0.4 12/04/2023 0751   BASOSABS 0.1 12/04/2023 1127   BASOSABS 0.1 12/04/2023 0751      Latest Ref Rng & Units 12/04/2023   11:27 AM 08/29/2023    9:24 AM 04/28/2023    9:38 AM  CMP  Glucose 70 - 99 mg/dL 79  86  92   BUN 6 - 20 mg/dL 4  11  4    Creatinine 0.57 - 1.00 mg/dL 9.22  9.13  9.21   Sodium 134 - 144 mmol/L 139  138  138   Potassium 3.5 - 5.2 mmol/L 4.2  4.1  4.1   Chloride 96 - 106 mmol/L 102  102  101   CO2 20 - 29 mmol/L 22  21  22    Calcium 8.7 - 10.2 mg/dL 9.3  9.5  9.4   Total Protein 6.0 - 8.5 g/dL 7.3  7.1  7.3   Total Bilirubin 0.0 - 1.2 mg/dL 0.3  <9.7  <9.7   Alkaline Phos 44 - 121 IU/L 84  79  85   AST 0 - 40 IU/L 13  13  18    ALT 0 - 32 IU/L 16  16  16      DIAGNOSTIC IMAGING:  I have independently reviewed the relevant imaging and discussed with the patient.   WRAP UP:  All questions were answered. The patient knows to call the clinic with any problems, questions or concerns.  Medical decision making: Low  Time spent on visit: I spent 15 minutes counseling the patient face to face. The total time spent in the appointment was 22 minutes and more than 50% was on counseling.  Pleasant CHRISTELLA Barefoot, PA-C  12/09/23 9:39 AM

## 2023-12-09 ENCOUNTER — Inpatient Hospital Stay (HOSPITAL_BASED_OUTPATIENT_CLINIC_OR_DEPARTMENT_OTHER): Admitting: Physician Assistant

## 2023-12-09 DIAGNOSIS — D500 Iron deficiency anemia secondary to blood loss (chronic): Secondary | ICD-10-CM

## 2023-12-09 DIAGNOSIS — D509 Iron deficiency anemia, unspecified: Secondary | ICD-10-CM | POA: Diagnosis not present

## 2023-12-09 DIAGNOSIS — D75839 Thrombocytosis, unspecified: Secondary | ICD-10-CM | POA: Diagnosis not present

## 2023-12-09 NOTE — Patient Instructions (Signed)
 [facility] at [facility] **VISIT SUMMARY & IMPORTANT INSTRUCTIONS **   You were seen today by Pleasant Barefoot PA-C for your follow-up visit.    ELEVATED PLATELETS Your platelets remain mildly elevated, which is likely reactive due to iron deficiency, obesity, and vaping.  IRON DEFICIENCY Your iron levels are lower than usual, which could be contributing to your fatigue. Will schedule you for IV iron x 2 doses. Continue to take your iron supplement (325 mg ferrous sulfate every other day).  FOLLOW-UP APPOINTMENT: Labs and office visit in 6 months  ** Thank you for trusting me with your healthcare!  I strive to provide all of my patients with quality care at each visit.  If you receive a survey for this visit, I would be so grateful to you for taking the time to provide feedback.  Thank you in advance!  ~ Dequavius Kuhner                   Dr. Alean Stands   &   Pleasant Barefoot, PA-C   - - - - - - - - - - - - - - - - - -    Thank you for choosing [facility] at [facility] to provide your oncology and hematology care.  To afford each patient quality time with our provider, please arrive at least 15 minutes before your scheduled appointment time.   If you have a lab appointment with the Cancer Center please come in thru the Main Entrance and check in at the main information desk.  You need to re-schedule your appointment should you arrive 10 or more minutes late.  We strive to give you quality time with our providers, and arriving late affects you and other patients whose appointments are after yours.  Also, if you no show three or more times for appointments you may be dismissed from the clinic at the providers discretion.     Again, thank you for choosing [facility].  Our hope is that these requests will decrease the amount of time that you wait before being seen by our physicians.        _____________________________________________________________  Should you have questions after your visit to [facility], please contact our office at [phone] and follow the prompts.  Our office hours are 8:00 a.m. and 4:30 p.m. Monday - Friday.  Please note that voicemails left after 4:00 p.m. may not be returned until the following business day.  We are closed weekends and major holidays.  You do have access to a nurse 24-7, just call the main number to the clinic [phone] and do not press any options, hold on the line and a nurse will answer the phone.    For prescription refill requests, have your pharmacy contact our office and allow 72 hours.

## 2023-12-12 ENCOUNTER — Ambulatory Visit

## 2023-12-12 VITALS — BP 128/62 | HR 68 | Temp 97.9°F | Resp 18

## 2023-12-12 DIAGNOSIS — D509 Iron deficiency anemia, unspecified: Secondary | ICD-10-CM | POA: Diagnosis not present

## 2023-12-12 DIAGNOSIS — D500 Iron deficiency anemia secondary to blood loss (chronic): Secondary | ICD-10-CM

## 2023-12-12 MED ORDER — SODIUM CHLORIDE 0.9 % IV SOLN
510.0000 mg | Freq: Once | INTRAVENOUS | Status: AC
  Administered 2023-12-12: 510 mg via INTRAVENOUS
  Filled 2023-12-12: qty 510

## 2023-12-12 MED ORDER — SODIUM CHLORIDE 0.9 % IV SOLN: INTRAVENOUS | Status: DC

## 2023-12-12 MED ORDER — CETIRIZINE HCL 10 MG PO TABS
10.0000 mg | ORAL_TABLET | Freq: Once | ORAL | Status: AC
Start: 2023-12-12 — End: 2023-12-12
  Administered 2023-12-12: 10 mg via ORAL
  Filled 2023-12-12: qty 1

## 2023-12-12 MED ORDER — ACETAMINOPHEN 325 MG PO TABS
650.0000 mg | ORAL_TABLET | Freq: Once | ORAL | Status: AC
  Administered 2023-12-12: 650 mg via ORAL
  Filled 2023-12-12: qty 2

## 2023-12-12 NOTE — Progress Notes (Signed)
   12/12/23 0800  Spiritual Encounters  Type of Visit Initial  Care provided to: Patient  Referral source  (Chaplain Making Rounds)  Reason for visit  (Spiritual Care Education)  OnCall Visit No  Spiritual Framework  Presenting Themes Other (comment) (Role of a Chaplain)  Interventions  Spiritual Care Interventions Made Other (comment) (Spritual Care Education)  Intervention Outcomes  Outcomes Awareness around self/spiritual resourses  Spiritual Care Plan  Spiritual Care Issues Still Outstanding No further spiritual care needs at this time (see row info)   Chaplain was on rounds on the floor and visited with [facility].  I used the opportunity to let her know that I am available and what the role of a chaplain is and what it isn't.  Lafern appreciated the information and thanked me for coming by.  It appears that no further spiritual care interventions are needed at this time.  Maude Roll, MDiv  Chaplain, [facility] [email or web address] [phone]

## 2023-12-12 NOTE — Progress Notes (Signed)
 Patient tolerated iron infusion with no complaints voiced.  Peripheral IV site clean and dry with good blood return noted before and after infusion.  Band aid applied.  VSS with discharge and left in satisfactory condition with no s/s of distress noted.

## 2023-12-12 NOTE — Patient Instructions (Signed)

## 2023-12-16 ENCOUNTER — Ambulatory Visit (HOSPITAL_COMMUNITY)
Admission: RE | Admit: 2023-12-16 | Discharge: 2023-12-16 | Disposition: A | Discharge status: Home / Self Care | Source: Ambulatory Visit | Attending: Family Medicine | Admitting: Family Medicine

## 2023-12-16 DIAGNOSIS — R1032 Left lower quadrant pain: Secondary | ICD-10-CM | POA: Diagnosis not present

## 2023-12-16 DIAGNOSIS — Z9049 Acquired absence of other specified parts of digestive tract: Secondary | ICD-10-CM | POA: Diagnosis not present

## 2023-12-16 DIAGNOSIS — Z90710 Acquired absence of both cervix and uterus: Secondary | ICD-10-CM | POA: Diagnosis not present

## 2023-12-16 DIAGNOSIS — R1030 Lower abdominal pain, unspecified: Secondary | ICD-10-CM | POA: Diagnosis not present

## 2023-12-18 NOTE — Patient Instructions (Incomplete)

## 2023-12-18 NOTE — Progress Notes (Deleted)
 PATIENT: Kristin Baker DOB: 23-Apr-1991  REASON FOR VISIT: follow up HISTORY FROM: patient  Virtual Visit via Mychart Video Note  I connected with Clotilda Rower on 12/18/23 at  8:45 AM EDT by video and verified that I am speaking with the correct person using two identifiers.   I discussed the limitations, risks, security and privacy concerns of performing an evaluation and management service by video and the availability of in person appointments. I also discussed with the patient that there may be a patient responsible charge related to this service. The patient expressed understanding and agreed to proceed.   History of Present Illness:  12/23/2023 ALL (Mychart): Kristin Baker returns for follow up for migraines. She was last seen 05/2023 and having more frequent headaches on Emgality  and we switched her to Ajovy . Since,   Rizatriptan ?   06/18/23 ALL (Mychart):  Kristin Baker returns for follow up for migraines. She was last seen 05/2022 and stable on Emgality  and rizatriptan . She reports headaches have worsened over the past 3-4 months. She has had difficulty with GI and anemia issues and feels this may contribute. She has lost about 20lbs. She reports having about 12 headache days a month. Some resolve spontaneously. She takes Tylenol  for back pain but does not help migraines. She has to treat with rizatriptan  about 3-4 times a month. Rizatriptan  does work well but makes he sleepy.   06/12/2022 ALL:  Kristin Baker is a 33 y.o. female here today for follow up for migraines. She was last seen by Dr Vear 05/2021 and reported daily headaches with at least 15 migrainous days. She was started on Emgality  for prevention. Topiramate  d/c'd due to intolerance. Rizatriptan  continued for abortive therapy. Since, she reports headaches did improve. She had 1-2 headache days on average. Rare migraines. Over the past two months, she has started having more daily tension/pressure headaches. This seemed to  start around the time she started having more menstrual bleeding. IUD placed last wee. She continues Megace . She feels she is more sensitive to light. She was seen by ophthalmology for questionable concerns of glaucoma. She has follow up in June. She is followed by GYN and recently referred to hematology for elevated platelets. She has appt next week.    HISTORY (copied from Dr Duncan previous note)  Kristin Baker is a 33 y.o. woman with chronic headaches and abnormal brain MRI.   Update 06/12/2021: She has daily headaches, about half with migrainous features since 06/12/22   She did not get a benefit from topiramate  and she had tolerability issues.    Caffeine limitations and other lifetyle modifications have not helped.  She notes that Maxalt -MLT will help abort the migrainous headaches when they occur.     She notes the pain in the right frontal region as a throbbing.  Others are in the right occiput as a searing hot poker.   When HA intensifies (1-2 times a week) she gets increased pain and also has nausea and much worse photophobia.    When a HA occurs, she has trouble focusing.   Medications tried: Topiramate  was not tolerated well and did not help.   She has tried Excedrin, ibuprofen  and Aleve without benefit.  SOme of these also upser her stomach.  Cyclobenzaprine was not tolerated.   Other She often has trouble falling asleep and feels there are some nights that she only sleeps 2 hours.  She has not been told that she snores.   She has an aunt with [name] has  not had episodes of numbness, visual changes, ataxia or other symptoms that would be typical for demyelination.     Imaging: MRI of the cervical spine 01/04/2021 showed minimal disc bulges at C3-C4, C5-C6 and C6-C7 that did not lead to nerve root compression or spinal stenosis.  There is a small syrinx adjacent to C6 unlikely to be significant.   MRI of the brain 05/24/2020 was fairly normal with just a single T2/flair  hyperintense focus in the right parietal periventricular white matter.  This is nonspecific   Observations/Objective:   Generalized: Well developed, in no acute distress  Mentation: Alert oriented to time, place, history taking. Follows all commands speech and language fluent   Assessment and Plan:  33 y.o. year old female  has a past medical history of Abdominal pain, ADHD, ANA positive, Back pain, DDD (degenerative disc disease), lumbar, Eye pain, right, Fatigue, Frontal sinusitis, Hypertension, Hypothyroidism, Iron deficiency anemia due to chronic blood loss (06/19/2022), Medical history non-contributory, Obesity, morbid (HCC), Palpitations, and Rheumatoid arthritis (HCC). here with  No diagnosis found.  Kristin Baker reports migraines have worsened over the past 3-4 months. We will switch Emgality  to Ajovy . She will continue rizatriptan  as needed for abortive therapy. Healthy lifestyle habits encouraged. She will continue close follow up with her care team. She will follow up with me in 6 months, sooner if needed.    No orders of the defined types were placed in this encounter.   No orders of the defined types were placed in this encounter.    Follow Up Instructions:  I discussed the assessment and treatment plan with the patient. The patient was provided an opportunity to ask questions and all were answered. The patient agreed with the plan and demonstrated an understanding of the instructions.   The patient was advised to call back or seek an in-person evaluation if the symptoms worsen or if the condition fails to improve as anticipated.  I provided 20 minutes of non-face-to-face time during this encounter. Patient located at their place of residence during Mychart visit. Provider is in the office.    Kristin Smart, NP

## 2023-12-19 ENCOUNTER — Inpatient Hospital Stay

## 2023-12-19 VITALS — BP 124/79 | HR 70 | Temp 98.1°F | Resp 18

## 2023-12-19 DIAGNOSIS — D500 Iron deficiency anemia secondary to blood loss (chronic): Secondary | ICD-10-CM

## 2023-12-19 DIAGNOSIS — D509 Iron deficiency anemia, unspecified: Secondary | ICD-10-CM | POA: Diagnosis not present

## 2023-12-19 MED ORDER — SODIUM CHLORIDE 0.9 % IV SOLN: INTRAVENOUS | Status: DC

## 2023-12-19 MED ORDER — ACETAMINOPHEN 325 MG PO TABS
650.0000 mg | ORAL_TABLET | Freq: Once | ORAL | Status: AC
  Administered 2023-12-19: 650 mg via ORAL
  Filled 2023-12-19: qty 2

## 2023-12-19 MED ORDER — CETIRIZINE HCL 10 MG PO TABS
10.0000 mg | ORAL_TABLET | Freq: Once | ORAL | Status: AC
  Administered 2023-12-19: 10 mg via ORAL
  Filled 2023-12-19: qty 1

## 2023-12-19 MED ORDER — SODIUM CHLORIDE 0.9 % IV SOLN
510.0000 mg | Freq: Once | INTRAVENOUS | Status: AC
  Administered 2023-12-19: 510 mg via INTRAVENOUS
  Filled 2023-12-19: qty 510

## 2023-12-19 NOTE — Patient Instructions (Signed)

## 2023-12-19 NOTE — Progress Notes (Signed)
 Patient tolerated iron infusion with no complaints voiced.  Peripheral IV site clean and dry with good blood return noted before and after infusion.  Band aid applied.  VSS with discharge and left in satisfactory condition with no s/s of distress noted.

## 2023-12-23 ENCOUNTER — Telehealth: Payer: Medicaid Other | Admitting: Family Medicine

## 2023-12-23 DIAGNOSIS — G43709 Chronic migraine without aura, not intractable, without status migrainosus: Secondary | ICD-10-CM

## 2023-12-29 ENCOUNTER — Ambulatory Visit (INDEPENDENT_AMBULATORY_CARE_PROVIDER_SITE_OTHER): Payer: Self-pay | Admitting: Family Medicine

## 2023-12-29 ENCOUNTER — Encounter: Payer: Self-pay | Admitting: Family Medicine

## 2023-12-29 VITALS — BP 124/85 | HR 80 | Resp 16 | Ht 67.0 in | Wt 291.0 lb

## 2023-12-29 DIAGNOSIS — I10 Essential (primary) hypertension: Secondary | ICD-10-CM

## 2023-12-29 DIAGNOSIS — E7849 Other hyperlipidemia: Secondary | ICD-10-CM | POA: Diagnosis not present

## 2023-12-29 DIAGNOSIS — E559 Vitamin D deficiency, unspecified: Secondary | ICD-10-CM

## 2023-12-29 DIAGNOSIS — R7301 Impaired fasting glucose: Secondary | ICD-10-CM

## 2023-12-29 DIAGNOSIS — E038 Other specified hypothyroidism: Secondary | ICD-10-CM

## 2023-12-29 NOTE — Patient Instructions (Signed)
 I appreciate the opportunity to provide care to you today!    Follow up:  4 months  Labs: please stop by the lab today to get your blood drawn (CMP, Lipid profile, HgA1c, Vit D)  For a Healthier YOU, I Recommend: Reducing your intake of sugar, sodium, carbohydrates, and saturated fats. Increasing your fiber intake by incorporating more whole grains, fruits, and vegetables into your meals. Setting healthy goals with a focus on lowering your consumption of carbs, sugar, and unhealthy fats. Adding variety to your diet by including a wide range of fruits and vegetables. Cutting back on soda and limiting processed foods as much as possible. Staying active: In addition to taking your weight loss medication, aim for at least 150 minutes of moderate-intensity physical activity each week for optimal results.    Please follow up if your symptoms worsen or fail to improve. .   Please continue to a heart-healthy diet and increase your physical activities. Try to exercise for at least five days a week.    It was a pleasure to see you and I look forward to continuing to work together on your health and well-being. Please do not hesitate to call the office if you need care or have questions about your care.  In case of emergency, please visit the Emergency Department for urgent care, or contact our clinic at [phone] to schedule an appointment. We're here to help you!   Have a wonderful day and week. With Gratitude, Leary Mcnulty MSN, FNP-BC

## 2023-12-29 NOTE — Assessment & Plan Note (Signed)
 Controlled Encouraged to continue olmesartan -hydrochlorothiazide  20-12.5 mg daily A low-sodium diet of less than 2,300 mg daily is recommended, along with moderate-intensity physical activity for at least 150 minutes per week. The patient is encouraged to maintain these lifestyle modifications to help manage her blood pressure effectively.  Long-term considerations were discussed, emphasizing that uncontrolled hypertension increases the risk of cardiovascular diseases, including stroke, coronary artery disease, and heart failure.  The patient is encouraged to seek emergency care if blood pressure exceeds 180/120 and is accompanied by symptoms such as headaches, chest pain, palpitations, blurred vision, or dizziness. She verbalized understanding and will follow up as scheduled.  BP Readings from Last 3 Encounters:  12/29/23 124/85  12/19/23 124/79  12/12/23 128/62

## 2023-12-29 NOTE — Progress Notes (Signed)
 Established Patient Office Visit  Subjective:  Patient ID: Kristin Baker, female    DOB: March 02, 1991  Age: 33 y.o. MRN: [id number]  CC:  Chief Complaint  Patient presents with   Follow-up    HPI Kristin Baker is a 33 y.o. female with past medical history of HTN, Subclinical hypothyroidism and hyperlipidemia presents for f/u of  chronic medical conditions. For the details of today's visit, please refer to the assessment and plan.     Past Medical History:  Diagnosis Date   Abdominal pain    ADHD    ANA positive    Back pain    DDD (degenerative disc disease), lumbar    Eye pain, right    Fatigue    Frontal sinusitis    Hypertension    Hypothyroidism    Iron deficiency anemia due to chronic blood loss 06/19/2022   Medical history non-contributory    Obesity, morbid (HCC)    Palpitations    Rheumatoid arthritis [facility])     Past Surgical History:  Procedure Laterality Date   ABLATION     05/22/2020 left side, 06/14/2020 right side    BIOPSY  02/17/2023   Procedure: BIOPSY;  Surgeon: Cinderella Deatrice FALCON, MD;  Location: [facility];  Service: Endoscopy;;   CHOLECYSTECTOMY     COLONOSCOPY WITH PROPOFOL  N/A 02/17/2023   Procedure: COLONOSCOPY WITH PROPOFOL ;  Surgeon: Cinderella Deatrice FALCON, MD;  Location: [facility];  Service: Endoscopy;  Laterality: N/A;  9:45am;asa 2   ESOPHAGOGASTRODUODENOSCOPY (EGD) WITH PROPOFOL  N/A 02/17/2023   Procedure: ESOPHAGOGASTRODUODENOSCOPY (EGD) WITH PROPOFOL ;  Surgeon: Cinderella Deatrice FALCON, MD;  Location: [facility];  Service: Endoscopy;  Laterality: N/A;  9:45am;asa 2   HYSTERECTOMY ABDOMINAL WITH SALPINGECTOMY     POLYPECTOMY  02/17/2023   Procedure: POLYPECTOMY;  Surgeon: Cinderella Deatrice FALCON, MD;  Location: [facility];  Service: Endoscopy;;   ROBOTIC ASSISTED TOTAL HYSTERECTOMY Bilateral 07/30/2022   Procedure: XI ROBOTIC ASSISTED TOTAL HYSTERECTOMY AND BILATERAL SALPINGECTOMY;  Surgeon: Jayne Vonn DEL, MD;  Location: [facility];  Service:  Gynecology;  Laterality: Bilateral;    Family History  Problem Relation Age of Onset   Cervical cancer Maternal Grandmother    Breast cancer Maternal Grandmother    Skin cancer Maternal Grandmother    Heart attack Maternal Grandfather    Diabetes Father    Heart attack Father    Hyperlipidemia Mother    Peripheral Artery Disease Mother    ADD / ADHD Mother    Bipolar disorder Mother    Cervical cancer Mother        at age 58.   ADD / ADHD Sister    Healthy Daughter    Breast cancer Maternal Aunt    Cervical cancer Maternal Aunt    ADD / ADHD Maternal Aunt    Uterine cancer Maternal Aunt    Breast cancer Maternal Aunt    Multiple sclerosis Paternal Aunt    Colon cancer Neg Hx     Social History   Socioeconomic History   Marital status: Married    Spouse name: Not on file   Number of children: 1   Years of education: Not on file   Highest education level: Associate degree: academic program  Occupational History   Not on file  Tobacco Use   Smoking status: Former    Current packs/day: 0.00    Average packs/day: 1 pack/day for 15.0 years (15.0 ttl pk-yrs)    Types: Cigarettes, E-cigarettes    Start  date: 11/24/2005    Quit date: 11/24/2020    Years since quitting: 3.0    Passive exposure: Current   Smokeless tobacco: Never   Tobacco comments:    She has quit of and on.  Vaping Use   Vaping status: Some Days  Substance and Sexual Activity   Alcohol use: Yes    Comment: occasionally /social events   Drug use: Not Currently    Comment: smoked weed during teen years   Sexual activity: Not Currently    Birth control/protection: Surgical    Comment: hyst  Other Topics Concern   Not on file  Social History Narrative   Married for May 2021.Lives with husband and daughter.Homemaker.Husband works at [employer].   Social Drivers of Corporate investment banker Strain: Low Risk  (09/23/2022)   Overall Financial Resource Strain (CARDIA)    Difficulty of Paying  Living Expenses: Not very hard  Food Insecurity: Low Risk  (03/01/2023)   Received from [facility]   Hunger Vital Sign    Within the past 12 months, you worried that your food would run out before you got money to buy more: Never true    Within the past 12 months, the food you bought just didn't last and you didn't have money to get more. : Never true  Transportation Needs: No Transportation Needs (03/01/2023)   Received from [facility]    In the past 12 months, has lack of reliable transportation kept you from medical appointments, meetings, work or from getting things needed for daily living? : No  Physical Activity: Unknown (09/23/2022)   Exercise Vital Sign    Days of Exercise per Week: Patient declined    Minutes of Exercise per Session: Not on file  Stress: Stress Concern Present (09/23/2022)   [employer] of Occupational Health - Occupational Stress Questionnaire    Feeling of Stress : Very much  Social Connections: Moderately Isolated (09/23/2022)   Social Connection and Isolation Panel    Frequency of Communication with Friends and Family: More than three times a week    Frequency of Social Gatherings with Friends and Family: Twice a week    Attends Religious Services: Never    Database administrator or Organizations: No    Attends Engineer, structural: Not on file    Marital Status: Married  Catering manager Violence: Not At Risk (02/27/2023)   Humiliation, Afraid, Rape, and Kick questionnaire    Fear of Current or Ex-Partner: No    Emotionally Abused: No    Physically Abused: No    Sexually Abused: No    Outpatient Medications Prior to Visit  Medication Sig Dispense Refill   acetaminophen  (TYLENOL ) 500 MG tablet Take 2 tablets (1,000 mg total) by mouth every 6 (six) hours. 30 tablet 0   albuterol  (VENTOLIN  HFA) 108 (90 Base) MCG/ACT inhaler Inhale 2 puffs into the lungs every 6 (six) hours as needed for wheezing or shortness of breath. 18  g 1   amphetamine -dextroamphetamine  (ADDERALL XR) 20 MG 24 hr capsule Take 20 mg by mouth daily.     amphetamine -dextroamphetamine  (ADDERALL) 5 MG tablet Take 5 mg by mouth daily in the afternoon.     Fremanezumab -vfrm (AJOVY ) 225 MG/1.5ML SOAJ Inject 225 mg into the skin every 30 (thirty) days. 4.5 mL 3   levocetirizine (XYZAL ) 5 MG tablet Take 1 tablet (5 mg total) by mouth daily. 30 tablet 2   levothyroxine  (SYNTHROID ) 150 MCG tablet  Take 1 tablet (150 mcg total) by mouth daily before breakfast. 90 tablet 1   lidocaine  (XYLOCAINE ) 2 % solution Use as directed 15 mLs in the mouth or throat every 3 (three) hours as needed for mouth pain. Apply topically to affected areas as needed for dental pain 100 mL 0   montelukast  (SINGULAIR ) 10 MG tablet Take 1 tablet (10 mg total) by mouth at bedtime. 30 tablet 3   olmesartan -hydrochlorothiazide  (BENICAR  HCT) 20-12.5 MG tablet Take 1 tablet by mouth daily. 90 tablet 1   ondansetron  (ZOFRAN ) 4 MG tablet Take 1 tablet (4 mg total) by mouth every 8 (eight) hours as needed for nausea or vomiting. 20 tablet 0   tiZANidine  (ZANAFLEX ) 4 MG tablet Take 1 tablet (4 mg total) by mouth at bedtime. 60 tablet 1   Vitamin D , Ergocalciferol , (DRISDOL ) 1.25 MG (50000 UNIT) CAPS capsule Take 1 capsule (50,000 Units total) by mouth every 7 (seven) days. 20 capsule 1   No facility-administered medications prior to visit.    Allergies  Allergen Reactions   Poison Ivy Extract Anaphylaxis   Poison Oak Extract Anaphylaxis   Anser Anser Feather (Goose) Allergy Skin Test    Flexeril [Cyclobenzaprine] Hives   Lactose Intolerance (Gi) Diarrhea   Honeysuckle Flower [Lonicera] Rash   Mixed Grasses Rash    ROS Review of Systems  Constitutional:  Negative for chills and fever.  Eyes:  Negative for visual disturbance.  Respiratory:  Negative for chest tightness and shortness of breath.   Neurological:  Negative for dizziness and headaches.      Objective:    Physical  Exam HENT:     Head: Normocephalic.     Mouth/Throat:     Mouth: Mucous membranes are moist.  Cardiovascular:     Rate and Rhythm: Normal rate.     Heart sounds: Normal heart sounds.  Pulmonary:     Effort: Pulmonary effort is normal.     Breath sounds: Normal breath sounds.  Neurological:     Mental Status: She is alert.     BP 124/85   Pulse 80   Resp 16   Ht 5' 7 (1.702 m)   Wt 291 lb (132 kg)   LMP 03/10/2022 (Approximate)   SpO2 96%   BMI 45.58 kg/m  Wt Readings from Last 3 Encounters:  12/29/23 291 lb (132 kg)  12/09/23 295 lb 13.7 oz (134.2 kg)  12/04/23 292 lb (132.5 kg)    Lab Results  Component Value Date   TSH 4.890 (H) 11/20/2023   Lab Results  Component Value Date   WBC 8.3 12/04/2023   HGB 13.9 12/04/2023   HCT 42.5 12/04/2023   MCV 94 12/04/2023   PLT 407 12/04/2023   Lab Results  Component Value Date   NA 139 12/04/2023   K 4.2 12/04/2023   CO2 22 12/04/2023   GLUCOSE 79 12/04/2023   BUN 4 (L) 12/04/2023   CREATININE 0.77 12/04/2023   BILITOT 0.3 12/04/2023   ALKPHOS 84 12/04/2023   AST 13 12/04/2023   ALT 16 12/04/2023   PROT 7.3 12/04/2023   ALBUMIN 4.1 12/04/2023   CALCIUM 9.3 12/04/2023   ANIONGAP 11 03/01/2023   EGFR 104 12/04/2023   Lab Results  Component Value Date   CHOL 207 (H) 08/29/2023   Lab Results  Component Value Date   HDL 42 08/29/2023   Lab Results  Component Value Date   LDLCALC 137 (H) 08/29/2023   Lab Results  Component Value Date  TRIG 154 (H) 08/29/2023   Lab Results  Component Value Date   CHOLHDL 4.9 (H) 08/29/2023   Lab Results  Component Value Date   HGBA1C 5.4 08/29/2023      Assessment & Plan:  Essential hypertension Assessment & Plan: Controlled Encouraged to continue olmesartan -hydrochlorothiazide  20-12.5 mg daily A low-sodium diet of less than 2,300 mg daily is recommended, along with moderate-intensity physical activity for at least 150 minutes per week. The patient is  encouraged to maintain these lifestyle modifications to help manage her blood pressure effectively.  Long-term considerations were discussed, emphasizing that uncontrolled hypertension increases the risk of cardiovascular diseases, including stroke, coronary artery disease, and heart failure.  The patient is encouraged to seek emergency care if blood pressure exceeds 180/120 and is accompanied by symptoms such as headaches, chest pain, palpitations, blurred vision, or dizziness. She verbalized understanding and will follow up as scheduled.  BP Readings from Last 3 Encounters:  12/29/23 124/85  12/19/23 124/79  12/12/23 128/62      Subclinical hypothyroidism Assessment & Plan: The patient is currently taking Synthroid  150 mcg daily.  The patient is encouraged to follow up if experiencing symptoms of hypothyroidism, despite compliance with the current treatment regimen, such as fatigue, weight gain, constipation, dry skin, cold intolerance, hair loss, or depression.  Lab Results  Component Value Date   TSH 4.890 (H) 11/20/2023      IFG (impaired fasting glucose) -     Hemoglobin A1c  Vitamin D  deficiency -     VITAMIN D  25 Hydroxy (Vit-D Deficiency, Fractures)  Other hyperlipidemia -     Lipid panel -     CMP14+EGFR  Note: This chart has been completed using Engineer, civil (consulting) software, and while attempts have been made to ensure accuracy, certain words and phrases may not be transcribed as intended.    Follow-up: Return in about 4 months (around 04/29/2024).   Kyen Taite, FNP

## 2023-12-29 NOTE — Assessment & Plan Note (Signed)
 The patient is currently taking Synthroid  150 mcg daily.  The patient is encouraged to follow up if experiencing symptoms of hypothyroidism, despite compliance with the current treatment regimen, such as fatigue, weight gain, constipation, dry skin, cold intolerance, hair loss, or depression.  Lab Results  Component Value Date   TSH 4.890 (H) 11/20/2023

## 2024-01-05 DIAGNOSIS — F411 Generalized anxiety disorder: Secondary | ICD-10-CM | POA: Diagnosis not present

## 2024-01-05 DIAGNOSIS — F329 Major depressive disorder, single episode, unspecified: Secondary | ICD-10-CM | POA: Diagnosis not present

## 2024-01-05 DIAGNOSIS — F909 Attention-deficit hyperactivity disorder, unspecified type: Secondary | ICD-10-CM | POA: Diagnosis not present

## 2024-01-16 ENCOUNTER — Other Ambulatory Visit: Payer: Self-pay | Admitting: Family Medicine

## 2024-01-16 DIAGNOSIS — J302 Other seasonal allergic rhinitis: Secondary | ICD-10-CM

## 2024-02-02 DIAGNOSIS — F988 Other specified behavioral and emotional disorders with onset usually occurring in childhood and adolescence: Secondary | ICD-10-CM | POA: Diagnosis not present

## 2024-02-02 DIAGNOSIS — F329 Major depressive disorder, single episode, unspecified: Secondary | ICD-10-CM | POA: Diagnosis not present

## 2024-02-02 DIAGNOSIS — F439 Reaction to severe stress, unspecified: Secondary | ICD-10-CM | POA: Diagnosis not present

## 2024-02-03 ENCOUNTER — Telehealth: Payer: Self-pay | Admitting: Pharmacy Technician

## 2024-02-03 ENCOUNTER — Other Ambulatory Visit (HOSPITAL_COMMUNITY): Payer: Self-pay

## 2024-02-03 NOTE — Telephone Encounter (Signed)
 Pharmacy Patient Advocate Encounter  Received notification from [facility] MEDICAID that Prior Authorization for AJOVY  (fremanezumab -vfrm) injection 225MG /1.5ML auto-injectors  has been APPROVED from 02/03/2024 to 02/02/2025   PA #/Case ID/Reference #: [id number]

## 2024-02-03 NOTE — Telephone Encounter (Signed)
 Pharmacy Patient Advocate Encounter   Received notification from Fax that prior authorization for AJOVY  (fremanezumab -vfrm) injection 225MG /1.5ML auto-injectors  is required/requested.   Insurance verification completed.   The patient is insured through [facility] MEDICAID .   Per test claim: PA required; PA submitted to above mentioned insurance via Latent Key/confirmation #/EOC [id number] Status is pending

## 2024-02-25 DIAGNOSIS — Z79899 Other long term (current) drug therapy: Secondary | ICD-10-CM | POA: Diagnosis not present

## 2024-02-25 DIAGNOSIS — F329 Major depressive disorder, single episode, unspecified: Secondary | ICD-10-CM | POA: Diagnosis not present

## 2024-02-25 DIAGNOSIS — F909 Attention-deficit hyperactivity disorder, unspecified type: Secondary | ICD-10-CM | POA: Diagnosis not present

## 2024-02-25 DIAGNOSIS — F411 Generalized anxiety disorder: Secondary | ICD-10-CM | POA: Diagnosis not present

## 2024-03-02 DIAGNOSIS — H521 Myopia: Secondary | ICD-10-CM | POA: Diagnosis not present

## 2024-03-02 DIAGNOSIS — R5382 Chronic fatigue, unspecified: Secondary | ICD-10-CM | POA: Diagnosis not present

## 2024-03-03 LAB — TSH: TSH: 1.86 u[IU]/mL (ref 0.450–4.500)

## 2024-03-03 LAB — T4, FREE: Free T4: 1.34 ng/dL (ref 0.82–1.77)

## 2024-03-03 NOTE — Patient Instructions (Signed)

## 2024-03-05 ENCOUNTER — Other Ambulatory Visit: Payer: Self-pay

## 2024-03-05 ENCOUNTER — Encounter: Payer: Self-pay | Admitting: Physician Assistant

## 2024-03-05 DIAGNOSIS — D500 Iron deficiency anemia secondary to blood loss (chronic): Secondary | ICD-10-CM

## 2024-03-08 ENCOUNTER — Encounter: Payer: Self-pay | Admitting: Nurse Practitioner

## 2024-03-08 ENCOUNTER — Inpatient Hospital Stay: Attending: Hematology

## 2024-03-08 ENCOUNTER — Ambulatory Visit: Admitting: Nurse Practitioner

## 2024-03-08 ENCOUNTER — Ambulatory Visit: Payer: Self-pay | Admitting: Physician Assistant

## 2024-03-08 VITALS — BP 114/78 | HR 83 | Ht 67.0 in | Wt 288.0 lb

## 2024-03-08 DIAGNOSIS — D75839 Thrombocytosis, unspecified: Secondary | ICD-10-CM | POA: Diagnosis not present

## 2024-03-08 DIAGNOSIS — R5382 Chronic fatigue, unspecified: Secondary | ICD-10-CM | POA: Diagnosis not present

## 2024-03-08 DIAGNOSIS — E063 Autoimmune thyroiditis: Secondary | ICD-10-CM

## 2024-03-08 DIAGNOSIS — D500 Iron deficiency anemia secondary to blood loss (chronic): Secondary | ICD-10-CM

## 2024-03-08 DIAGNOSIS — D509 Iron deficiency anemia, unspecified: Secondary | ICD-10-CM | POA: Insufficient documentation

## 2024-03-08 LAB — CBC WITH DIFFERENTIAL/PLATELET
Abs Immature Granulocytes: 0.03 K/uL (ref 0.00–0.07)
Basophils Absolute: 0.1 K/uL (ref 0.0–0.1)
Basophils Relative: 1 %
Eosinophils Absolute: 0.3 K/uL (ref 0.0–0.5)
Eosinophils Relative: 3 %
HCT: 43.2 % (ref 36.0–46.0)
Hemoglobin: 14.8 g/dL (ref 12.0–15.0)
Immature Granulocytes: 0 %
Lymphocytes Relative: 27 %
Lymphs Abs: 2.8 K/uL (ref 0.7–4.0)
MCH: 30.9 pg (ref 26.0–34.0)
MCHC: 34.3 g/dL (ref 30.0–36.0)
MCV: 90.2 fL (ref 80.0–100.0)
Monocytes Absolute: 0.6 K/uL (ref 0.1–1.0)
Monocytes Relative: 6 %
Neutro Abs: 6.6 K/uL (ref 1.7–7.7)
Neutrophils Relative %: 63 %
Platelets: 417 K/uL — ABNORMAL HIGH (ref 150–400)
RBC: 4.79 MIL/uL (ref 3.87–5.11)
RDW: 13.2 % (ref 11.5–15.5)
WBC: 10.3 K/uL (ref 4.0–10.5)
nRBC: 0 % (ref 0.0–0.2)

## 2024-03-08 LAB — IRON AND TIBC
Iron: 57 ug/dL (ref 28–170)
Saturation Ratios: 19 % (ref 10.4–31.8)
TIBC: 302 ug/dL (ref 250–450)
UIBC: 245 ug/dL

## 2024-03-08 LAB — COMPREHENSIVE METABOLIC PANEL WITH GFR
ALT: 21 U/L (ref 0–44)
AST: 16 U/L (ref 15–41)
Albumin: 4.2 g/dL (ref 3.5–5.0)
Alkaline Phosphatase: 79 U/L (ref 38–126)
Anion gap: 11 (ref 5–15)
BUN: 7 mg/dL (ref 6–20)
CO2: 25 mmol/L (ref 22–32)
Calcium: 9.7 mg/dL (ref 8.9–10.3)
Chloride: 104 mmol/L (ref 98–111)
Creatinine, Ser: 0.84 mg/dL (ref 0.44–1.00)
GFR, Estimated: >60 mL/min (ref >60)
Glucose, Bld: 97 mg/dL (ref 70–99)
Potassium: 4 mmol/L (ref 3.5–5.1)
Sodium: 139 mmol/L (ref 135–145)
Total Bilirubin: 0.3 mg/dL (ref 0.0–1.2)
Total Protein: 7.9 g/dL (ref 6.5–8.1)

## 2024-03-08 LAB — FERRITIN: Ferritin: 271 ng/mL (ref 11–307)

## 2024-03-08 NOTE — Progress Notes (Signed)
 Patient aware and verbalized understanding.

## 2024-03-08 NOTE — Progress Notes (Signed)
 Endocrinology Follow Up Note                                         03/08/2024, 2:53 PM  Subjective:   Subjective    Kristin Baker is a 33 y.o.-year-old female patient being seen in follow up after being seen in consultation for hypothyroidism referred by Edman Meade PEDLAR, FNP.   Past Medical History:  Diagnosis Date   Abdominal pain    ADHD    ANA positive    Back pain    DDD (degenerative disc disease), lumbar    Eye pain, right    Fatigue    Frontal sinusitis    Hypertension    Hypothyroidism    Iron deficiency anemia due to chronic blood loss 06/19/2022   Medical history non-contributory    Obesity, morbid (HCC)    Palpitations    Rheumatoid arthritis [facility])     Past Surgical History:  Procedure Laterality Date   ABLATION     05/22/2020 left side, 06/14/2020 right side    BIOPSY  02/17/2023   Procedure: BIOPSY;  Surgeon: Cinderella Deatrice FALCON, MD;  Location: [facility];  Service: Endoscopy;;   CHOLECYSTECTOMY     COLONOSCOPY WITH PROPOFOL  N/A 02/17/2023   Procedure: COLONOSCOPY WITH PROPOFOL ;  Surgeon: Cinderella Deatrice FALCON, MD;  Location: [facility];  Service: Endoscopy;  Laterality: N/A;  9:45am;asa 2   ESOPHAGOGASTRODUODENOSCOPY (EGD) WITH PROPOFOL  N/A 02/17/2023   Procedure: ESOPHAGOGASTRODUODENOSCOPY (EGD) WITH PROPOFOL ;  Surgeon: Cinderella Deatrice FALCON, MD;  Location: [facility];  Service: Endoscopy;  Laterality: N/A;  9:45am;asa 2   HYSTERECTOMY ABDOMINAL WITH SALPINGECTOMY     POLYPECTOMY  02/17/2023   Procedure: POLYPECTOMY;  Surgeon: Cinderella Deatrice FALCON, MD;  Location: [facility];  Service: Endoscopy;;   ROBOTIC ASSISTED TOTAL HYSTERECTOMY Bilateral 07/30/2022   Procedure: XI ROBOTIC ASSISTED TOTAL HYSTERECTOMY AND BILATERAL SALPINGECTOMY;  Surgeon: Jayne Vonn DEL, MD;  Location: [facility] OR[facility];  Service: Gynecology;  Laterality: Bilateral;    Social History   Socioeconomic History   Marital  status: Married    Spouse name: Not on file   Number of children: 1   Years of education: Not on file   Highest education level: Associate degree: academic program  Occupational History   Not on file  Tobacco Use   Smoking status: Former    Current packs/day: 0.00    Average packs/day: 1 pack/day for 15.0 years (15.0 ttl pk-yrs)    Types: Cigarettes, E-cigarettes    Start date: 11/24/2005    Quit date: 11/24/2020    Years since quitting: 3.2    Passive exposure: Current   Smokeless tobacco: Never   Tobacco comments:    She has quit of and on.  Vaping Use   Vaping status: Some Days  Substance and Sexual Activity   Alcohol use: Yes    Comment: occasionally /social events   Drug use: Not Currently    Comment: smoked weed during teen years   Sexual activity:  Not Currently    Birth control/protection: Surgical    Comment: hyst  Other Topics Concern   Not on file  Social History Narrative   Married for May 2021.Lives with husband and daughter.Homemaker.Husband works at [employer].   Social Drivers of Corporate investment banker Strain: Low Risk  (09/23/2022)   Overall Financial Resource Strain (CARDIA)    Difficulty of Paying Living Expenses: Not very hard  Food Insecurity: Low Risk  (03/01/2023)   Received from [facility]   Hunger Vital Sign    Within the past 12 months, you worried that your food would run out before you got money to buy more: Never true    Within the past 12 months, the food you bought just didn't last and you didn't have money to get more. : Never true  Transportation Needs: No Transportation Needs (03/01/2023)   Received from [facility]    In the past 12 months, has lack of reliable transportation kept you from medical appointments, meetings, work or from getting things needed for daily living? : No  Physical Activity: Unknown (09/23/2022)   Exercise Vital Sign    Days of Exercise per Week: Patient declined    Minutes of  Exercise per Session: Not on file  Stress: Stress Concern Present (09/23/2022)   [employer] of Occupational Health - Occupational Stress Questionnaire    Feeling of Stress : Very much  Social Connections: Moderately Isolated (09/23/2022)   Social Connection and Isolation Panel    Frequency of Communication with Friends and Family: More than three times a week    Frequency of Social Gatherings with Friends and Family: Twice a week    Attends Religious Services: Never    Database administrator or Organizations: No    Attends Engineer, structural: Not on file    Marital Status: Married    Family History  Problem Relation Age of Onset   Cervical cancer Maternal Grandmother    Breast cancer Maternal Grandmother    Skin cancer Maternal Grandmother    Heart attack Maternal Grandfather    Diabetes Father    Heart attack Father    Hyperlipidemia Mother    Peripheral Artery Disease Mother    ADD / ADHD Mother    Bipolar disorder Mother    Cervical cancer Mother        at age 6.   ADD / ADHD Sister    Healthy Daughter    Breast cancer Maternal Aunt    Cervical cancer Maternal Aunt    ADD / ADHD Maternal Aunt    Uterine cancer Maternal Aunt    Breast cancer Maternal Aunt    Multiple sclerosis Paternal Aunt    Colon cancer Neg Hx     Outpatient Encounter Medications as of 03/08/2024  Medication Sig   acetaminophen  (TYLENOL ) 500 MG tablet Take 2 tablets (1,000 mg total) by mouth every 6 (six) hours. (Patient taking differently: Take 1,000 mg by mouth every 6 (six) hours. As needed)   albuterol  (VENTOLIN  HFA) 108 (90 Base) MCG/ACT inhaler Inhale 2 puffs into the lungs every 6 (six) hours as needed for wheezing or shortness of breath.   amphetamine -dextroamphetamine  (ADDERALL XR) 20 MG 24 hr capsule Take 20 mg by mouth daily.   amphetamine -dextroamphetamine  (ADDERALL) 5 MG tablet Take 5 mg by mouth daily in the afternoon.   Fremanezumab -vfrm (AJOVY ) 225 MG/1.5ML SOAJ  Inject 225 mg into the skin every 30 (thirty) days.  levocetirizine (XYZAL ) 5 MG tablet TAKE 1 TABLET(5 MG) BY MOUTH DAILY   levothyroxine  (SYNTHROID ) 150 MCG tablet Take 1 tablet (150 mcg total) by mouth daily before breakfast.   lidocaine  (XYLOCAINE ) 2 % solution Use as directed 15 mLs in the mouth or throat every 3 (three) hours as needed for mouth pain. Apply topically to affected areas as needed for dental pain   montelukast  (SINGULAIR ) 10 MG tablet Take 1 tablet (10 mg total) by mouth at bedtime.   olmesartan -hydrochlorothiazide  (BENICAR  HCT) 20-12.5 MG tablet Take 1 tablet by mouth daily.   ondansetron  (ZOFRAN ) 4 MG tablet Take 1 tablet (4 mg total) by mouth every 8 (eight) hours as needed for nausea or vomiting.   tiZANidine  (ZANAFLEX ) 4 MG tablet Take 1 tablet (4 mg total) by mouth at bedtime.   Vitamin D , Ergocalciferol , (DRISDOL ) 1.25 MG (50000 UNIT) CAPS capsule Take 1 capsule (50,000 Units total) by mouth every 7 (seven) days.   No facility-administered encounter medications on file as of 03/08/2024.    ALLERGIES: Allergies  Allergen Reactions   Poison Ivy Extract Anaphylaxis   Poison Oak Extract Anaphylaxis   Anser Anser Feather (Goose) Allergy Skin Test    Flexeril [Cyclobenzaprine] Hives   Lactose Intolerance (Gi) Diarrhea   Honeysuckle Flower [Lonicera] Rash   Mixed Grasses Rash   VACCINATION STATUS: Immunization History  Administered Date(s) Administered   Moderna Sars-Covid-2 Vaccination 10/08/2019, 11/05/2019   Tdap 08/31/2020     HPI   Giannah Zavadil  is a patient with the above medical history. she was diagnosed with hypothyroidism at approximate age of 15 years, which required subsequent initiation of thyroid  hormone replacement therapy. She was first diagnosed when she was with Dr. Caswell (or his NP Lauraine Pereyra).  After his passing, her care was transferred to her current PCP.  she was given various doses of Levothyroxine  over the years, currently on 125  micrograms.  This has been changed several times by her PCP recently. she reports compliance to this medication:  but she does note she takes it with her BP and allergy pill in the morning.  She does take iron supplement, but takes that at night.  She notes her PCP told her it was ok to take all her medications together.  I reviewed patient's thyroid  tests:  Lab Results  Component Value Date   TSH 1.860 03/02/2024   TSH 4.890 (H) 11/20/2023   TSH 6.410 (H) 08/29/2023   TSH 1.160 08/13/2023   TSH 8.230 (H) 04/28/2023   TSH 6.470 (H) 03/10/2023   TSH 8.570 (H) 01/23/2023   TSH 8.360 (H) 09/23/2022   TSH 2.530 04/26/2022   TSH 3.390 10/19/2021   FREET4 1.34 03/02/2024   FREET4 0.69 (L) 11/20/2023   FREET4 1.08 08/29/2023   FREET4 1.13 08/13/2023   FREET4 1.02 04/28/2023   FREET4 0.94 03/10/2023   FREET4 1.25 01/23/2023   FREET4 0.70 (L) 09/23/2022   FREET4 1.13 04/26/2022   FREET4 0.94 10/19/2021    Pt denies feeling nodules in neck, hoarseness, SOB with lying down.  she does have extensive family history of autoimmune disorders and has family history of thyroid  disorders in her uncle (total thyroidectomy for unknown reason), grandmother (? Hypothyroidism) and mom (recently diagnosed with thyroid  issues).  She did report her uncle has history of thyroid  cancer (requiring total thyroidectomy).  No history of radiation therapy to head or neck.  No recent use of iodine  supplements.  Denies use of Biotin containing supplements.  I reviewed  her chart and she also has a history of migraines, HTN, constipation, depression/anxiety, HLD, anemia, and vitamin d  deficiency.  Review of systems  Constitutional: + decreasing body weight,  current Body mass index is 45.11 kg/m. , +debilitating fatigue, + subjective hyperthermia (mostly at night), no subjective hypothermia Eyes: no blurry vision, no xerophthalmia ENT: no sore throat, no nodules palpated in throat, no dysphagia/odynophagia, no  hoarseness Cardiovascular: no chest pain, + shortness of breath, no palpitations, no leg swelling Respiratory: no cough, + shortness of breath Gastrointestinal: no nausea/vomiting/diarrhea Musculoskeletal: no muscle/joint aches Skin: no rashes, no hyperemia Neurological: no tremors, no numbness, no tingling, + dizziness Psychiatric: no depression, no anxiety   Objective:   Objective     BP 114/78 (BP Location: Left Arm, Patient Position: Sitting)   Pulse 83   Ht 5' 7 (1.702 m)   Wt 288 lb (130.6 kg)   LMP 03/10/2022 (Approximate)   BMI 45.11 kg/m  Wt Readings from Last 3 Encounters:  03/08/24 288 lb (130.6 kg)  12/29/23 291 lb (132 kg)  12/09/23 295 lb 13.7 oz (134.2 kg)    BP Readings from Last 3 Encounters:  03/08/24 114/78  12/29/23 124/85  12/19/23 124/79      Physical Exam- Limited  Constitutional:  Body mass index is 45.11 kg/m. , not in acute distress, normal state of mind Eyes:  EOMI, no exophthalmos Musculoskeletal: no gross deformities, strength intact in all four extremities, no gross restriction of joint movements Skin:  no rashes, no hyperemia Neurological: no tremor with outstretched hands   CMP ( most recent) CMP     Component Value Date/Time   NA 139 03/08/2024 0812   NA 139 12/04/2023 1127   K 4.0 03/08/2024 0812   CL 104 03/08/2024 0812   CO2 25 03/08/2024 0812   GLUCOSE 97 03/08/2024 0812   BUN 7 03/08/2024 0812   BUN 4 (L) 12/04/2023 1127   CREATININE 0.84 03/08/2024 0812   CREATININE 0.73 03/14/2023 0910   CALCIUM 9.7 03/08/2024 0812   PROT 7.9 03/08/2024 0812   PROT 7.3 12/04/2023 1127   ALBUMIN 4.2 03/08/2024 0812   ALBUMIN 4.1 12/04/2023 1127   AST 16 03/08/2024 0812   ALT 21 03/08/2024 0812   ALKPHOS 79 03/08/2024 0812   BILITOT 0.3 03/08/2024 0812   BILITOT 0.3 12/04/2023 1127   EGFR 104 12/04/2023 1127   GFRNONAA >60 03/08/2024 0812   GFRNONAA 105 10/04/2020 0000     Diabetic Labs (most recent): Lab Results   Component Value Date   HGBA1C 5.4 08/29/2023   HGBA1C 5.4 04/28/2023   HGBA1C 5.3 09/23/2022     Lipid Panel ( most recent) Lipid Panel     Component Value Date/Time   CHOL 207 (H) 08/29/2023 0924   TRIG 154 (H) 08/29/2023 0924   HDL 42 08/29/2023 0924   CHOLHDL 4.9 (H) 08/29/2023 0924   CHOLHDL 7.0 (H) 01/11/2020 1154   LDLCALC 137 (H) 08/29/2023 0924   LDLCALC 166 (H) 01/11/2020 1154   LABVLDL 28 08/29/2023 0924       Lab Results  Component Value Date   TSH 1.860 03/02/2024   TSH 4.890 (H) 11/20/2023   TSH 6.410 (H) 08/29/2023   TSH 1.160 08/13/2023   TSH 8.230 (H) 04/28/2023   TSH 6.470 (H) 03/10/2023   TSH 8.570 (H) 01/23/2023   TSH 8.360 (H) 09/23/2022   TSH 2.530 04/26/2022   TSH 3.390 10/19/2021   FREET4 1.34 03/02/2024   FREET4 0.69 (L) 11/20/2023  FREET4 1.08 08/29/2023   FREET4 1.13 08/13/2023   FREET4 1.02 04/28/2023   FREET4 0.94 03/10/2023   FREET4 1.25 01/23/2023   FREET4 0.70 (L) 09/23/2022   FREET4 1.13 04/26/2022   FREET4 0.94 10/19/2021    Thyroid  US  from 07/31/23 CLINICAL DATA:  Hashimoto's thyroiditis   EXAM: THYROID  ULTRASOUND   TECHNIQUE: Ultrasound examination of the thyroid  gland and adjacent soft tissues was performed.   COMPARISON:  None Available.   FINDINGS: Parenchymal Echotexture: Moderately heterogenous   Isthmus: 2 mm   Right lobe: 4.3 x 1.8 x 1.1 cm   Left lobe: 3.5 x 1.2 x 1.3 cm   _________________________________________________________   Estimated total number of nodules >/= 1 cm: 0   Number of spongiform nodules >/=  2 cm not described below (TR1): 0   Number of mixed cystic and solid nodules >/= 1.5 cm not described below (TR2): 0   _________________________________________________________   Moderate diffuse thyroid  heterogeneity without hypervascularity compatible with chronic medical thyroid  disease.   Nodule # 1:   Location: Left; Mid   Maximum size: 0.9 cm; Other 2 dimensions: 0.8 x 0.7  cm   Composition: solid/almost completely solid (2)   Echogenicity: isoechoic (1)   Shape: not taller-than-wide (0)   Margins: ill-defined (0)   Echogenic foci: none (0)   ACR TI-RADS total points: 3.   ACR TI-RADS risk category: TR3 (3 points).   ACR TI-RADS recommendations:   Given size (<1.4 cm) and appearance, this nodule does NOT meet TI-RADS criteria for biopsy or dedicated follow-up.   _________________________________________________________   Nodule # 2:   Location: Left; Mid   Maximum size: 0.9 cm; Other 2 dimensions: 0.8 x 0.8 cm   Composition: solid/almost completely solid (2)   Echogenicity: isoechoic (1)   Shape: not taller-than-wide (0)   Margins: ill-defined (0)   Echogenic foci: peripheral calcifications (2)   ACR TI-RADS total points: 5.   ACR TI-RADS risk category: TR4 (4-6 points).   ACR TI-RADS recommendations:   Given size (<0.9 cm) and appearance, this nodule does NOT meet TI-RADS criteria for biopsy or dedicated follow-up.   _________________________________________________________   No additional right thyroid  abnormality or nodule. No regional adenopathy.   IMPRESSION: 1. Moderate diffuse thyroid  heterogeneity compatible with chronic medical thyroid  disease. 2. 0.9 cm left mid thyroid  adjacent TR 3 and TR 4 nodules as above, which do not meet criteria for biopsy or dedicated follow-up.     Electronically Signed   By: CHRISTELLA.  Shick M.D.   On: 08/06/2023 11:18     Latest Reference Range & Units 11/20/23 10:23 03/02/24 08:41  TSH 0.450 - 4.500 uIU/mL 4.890 (H) 1.860  T4,Free(Direct) 0.82 - 1.77 ng/dL 9.30 (L) 8.65  (H): Data is abnormally high (L): Data is abnormally low  Assessment & Plan:   ASSESSMENT / PLAN:  1. Hypothyroidism- due to Hashimoto's thyroiditis 2. Multinodular thyroid   Patient with long-standing hypothyroidism, on levothyroxine  therapy. On physical exam, patient does not have gross goiter, thyroid   nodules, or neck compression symptoms.  Positive TPO antibodies confirm autoimmune etiology.  -Her previsit TFTs are consistent with appropriate hormone replacement.  She is advised to continue Levothyroxine  150 mcg po daily before breakfast. Will recheck labs in 3 months and adjust dose accordingly.  - We discussed about correct intake of levothyroxine , at fasting, with water , separated by at least 30 minutes from breakfast, and separated by more than 4 hours from calcium, iron, multivitamins, acid reflux medications (PPIs). -Patient is made aware  of the fact that thyroid  hormone replacement is needed for life, dose to be adjusted by periodic monitoring of thyroid  function tests.  Her thyroid  ultrasound shows moderate heterogeneity consistent with chronic medical thyroid  disease.  There were 2 nodules which do not meet criteria for additional surveillance or biopsy.  I did add on B12 to her labs she just had drawn today to see if that could be the source of her debilitating fatigue.     I spent  14  minutes in the care of the patient today including review of labs from Thyroid  Function, CMP, and other relevant labs ; imaging/biopsy records (current and previous including abstractions from other facilities); face-to-face time discussing  her lab results and symptoms, medications doses, her options of short and long term treatment based on the latest standards of care / guidelines;   and documenting the encounter.  Clotilda Rower  participated in the discussions, expressed understanding, and voiced agreement with the above plans.  All questions were answered to her satisfaction. she is encouraged to contact clinic should she have any questions or concerns prior to her return visit.   FOLLOW UP PLAN:  Return in about 3 months (around 06/08/2024) for Thyroid  follow up, Previsit labs.  Benton Rio, [facility] [address] [place], [place]  [address] Phone: [phone] Fax: [phone]  03/08/2024, 2:53 PM

## 2024-03-09 ENCOUNTER — Ambulatory Visit: Payer: Self-pay | Admitting: Nurse Practitioner

## 2024-03-09 LAB — VITAMIN B12: Vitamin B-12: 362 pg/mL (ref 232–1245)

## 2024-03-09 LAB — SPECIMEN STATUS REPORT

## 2024-03-09 NOTE — Progress Notes (Signed)
 FYI I sent mychart message going over b12 results

## 2024-03-09 NOTE — Progress Notes (Signed)
 Noted

## 2024-04-29 ENCOUNTER — Other Ambulatory Visit: Payer: Self-pay | Admitting: Medical Genetics

## 2024-04-30 ENCOUNTER — Other Ambulatory Visit (HOSPITAL_COMMUNITY)

## 2024-05-01 ENCOUNTER — Other Ambulatory Visit: Payer: Self-pay | Admitting: Family Medicine

## 2024-05-01 DIAGNOSIS — M545 Low back pain, unspecified: Secondary | ICD-10-CM

## 2024-05-03 ENCOUNTER — Inpatient Hospital Stay (HOSPITAL_COMMUNITY): Admission: RE | Admit: 2024-05-03 | Discharge: 2024-05-03 | Payer: Self-pay | Attending: Oncology | Admitting: Oncology

## 2024-05-06 ENCOUNTER — Telehealth: Admitting: Family Medicine

## 2024-05-06 DIAGNOSIS — I10 Essential (primary) hypertension: Secondary | ICD-10-CM

## 2024-05-06 DIAGNOSIS — M545 Low back pain, unspecified: Secondary | ICD-10-CM | POA: Diagnosis not present

## 2024-05-06 DIAGNOSIS — G8929 Other chronic pain: Secondary | ICD-10-CM

## 2024-05-06 MED ORDER — TIZANIDINE HCL 4 MG PO TABS: 4.0000 mg | ORAL_TABLET | Freq: Every day | ORAL | 1 refills | Status: AC

## 2024-05-06 NOTE — Assessment & Plan Note (Addendum)
 BP controlled. The patient reports that she has not been taking her BP medication since October 2025. She states that since her anxiety has improved, her blood pressure has also been better controlled. Encouraged her to continue monitoring her BP at home. Advised a low-sodium diet and increased physical activity as tolerated.

## 2024-05-06 NOTE — Progress Notes (Signed)
 Virtual Visit via Video Note  I connected with Kristin Baker on 05/09/2024 at  9:00 AM EST by a video enabled telemedicine application and verified that I am speaking with the correct person using two identifiers.  Patient Location: Home Provider Location: Home Office  I discussed the limitations, risks, security, and privacy concerns of performing an evaluation and management service by video and the availability of in person appointments. I also discussed with the patient that there may be a patient responsible charge related to this service. The patient expressed understanding and agreed to proceed.  Subjective: PCP: Edman Meade PEDLAR, FNP  Chief Complaint  Patient presents with   Medical Management of Chronic Issues    Four month follow up   HPI The patient is seen today for chronic medical management.  For the details of today's visit, please refer to the assessment and plan.     ROS: Per HPI Current Medications[1]  Observations/Objective: There were no vitals filed for this visit. Physical Exam Patient is well-developed, well-nourished in no acute distress.  Resting comfortably at home.  Head is normocephalic, atraumatic.  No labored breathing.  Speech is clear and coherent with logical content.  Patient is alert and oriented at baseline.   Assessment and Plan: Primary hypertension Assessment & Plan: BP controlled. The patient reports that she has not been taking her BP medication since October 2025. She states that since her anxiety has improved, her blood pressure has also been better controlled. Encouraged her to continue monitoring her BP at home. Advised a low-sodium diet and increased physical activity as tolerated.    Chronic bilateral low back pain without sciatica Assessment & Plan: Refills sent to the pharmacy  Orders: -     tiZANidine  HCl; Take 1 tablet (4 mg total) by mouth at bedtime.  Dispense: 60 tablet; Refill: 1  Note: This chart has been  completed using Engineer, Civil (consulting) software, and while attempts have been made to ensure accuracy, certain words and phrases may not be transcribed as intended.    Follow Up Instructions: No follow-ups on file.   I discussed the assessment and treatment plan with the patient. The patient was provided an opportunity to ask questions, and all were answered. The patient agreed with the plan and demonstrated an understanding of the instructions.   The patient was advised to call back or seek an in-person evaluation if the symptoms worsen or if the condition fails to improve as anticipated.  The above assessment and management plan was discussed with the patient. The patient verbalized understanding of and has agreed to the management plan.   Meade PEDLAR Edman, FNP     [1]  Current Outpatient Medications:    acetaminophen  (TYLENOL ) 500 MG tablet, Take 2 tablets (1,000 mg total) by mouth every 6 (six) hours. (Patient taking differently: Take 1,000 mg by mouth every 6 (six) hours. As needed), Disp: 30 tablet, Rfl: 0   albuterol  (VENTOLIN  HFA) 108 (90 Base) MCG/ACT inhaler, Inhale 2 puffs into the lungs every 6 (six) hours as needed for wheezing or shortness of breath., Disp: 18 g, Rfl: 1   amphetamine -dextroamphetamine  (ADDERALL XR) 20 MG 24 hr capsule, Take 20 mg by mouth daily., Disp: , Rfl:    amphetamine -dextroamphetamine  (ADDERALL) 5 MG tablet, Take 5 mg by mouth daily in the afternoon., Disp: , Rfl:    Fremanezumab -vfrm (AJOVY ) 225 MG/1.5ML SOAJ, Inject 225 mg into the skin every 30 (thirty) days., Disp: 4.5 mL, Rfl: 3  levocetirizine (XYZAL ) 5 MG tablet, TAKE 1 TABLET(5 MG) BY MOUTH DAILY, Disp: 30 tablet, Rfl: 2   levothyroxine  (SYNTHROID ) 150 MCG tablet, Take 1 tablet (150 mcg total) by mouth daily before breakfast., Disp: 90 tablet, Rfl: 1   lidocaine  (XYLOCAINE ) 2 % solution, Use as directed 15 mLs in the mouth or throat every 3 (three) hours as needed for mouth pain. Apply  topically to affected areas as needed for dental pain, Disp: 100 mL, Rfl: 0   montelukast  (SINGULAIR ) 10 MG tablet, Take 1 tablet (10 mg total) by mouth at bedtime., Disp: 30 tablet, Rfl: 3   olmesartan -hydrochlorothiazide  (BENICAR  HCT) 20-12.5 MG tablet, Take 1 tablet by mouth daily., Disp: 90 tablet, Rfl: 1   ondansetron  (ZOFRAN ) 4 MG tablet, Take 1 tablet (4 mg total) by mouth every 8 (eight) hours as needed for nausea or vomiting., Disp: 20 tablet, Rfl: 0   tiZANidine  (ZANAFLEX ) 4 MG tablet, Take 1 tablet (4 mg total) by mouth at bedtime., Disp: 60 tablet, Rfl: 1   Vitamin D , Ergocalciferol , (DRISDOL ) 1.25 MG (50000 UNIT) CAPS capsule, Take 1 capsule (50,000 Units total) by mouth every 7 (seven) days., Disp: 20 capsule, Rfl: 1

## 2024-05-09 NOTE — Assessment & Plan Note (Signed)
 Refills sent to the pharmacy.

## 2024-05-14 LAB — GENECONNECT MOLECULAR SCREEN: Genetic Analysis Overall Interpretation: NEGATIVE

## 2024-05-28 ENCOUNTER — Ambulatory Visit: Payer: Self-pay

## 2024-05-28 NOTE — Telephone Encounter (Signed)
" °  FYI Only or Action Required?: FYI only for provider: Will call opthalmologist or go to UC.  Patient was last seen in primary care on 05/06/2024 by Edman Meade PEDLAR, FNP.  Called Nurse Triage reporting Belepharitis.  Symptoms began yesterday.  Interventions attempted: Nothing.  Symptoms are: stable.  Triage Disposition: See Physician Within 24 Hours  Patient/caregiver understands and will follow disposition?:   Copied from CRM #[id number]. Topic: Clinical - Red Word Triage >> May 28, 2024  8:12 AM Darshell M wrote: Red Word that prompted transfer to Nurse Triage: Used expired contact solution with hydrogen peroxide yesterday. Eye is swollen and painful. Reason for Disposition  MODERATE-SEVERE eyelid swelling on one side  (Exception: Due to a mosquito bite.)  Answer Assessment - Initial Assessment Questions 1. ONSET: When did the swelling start? (e.g., minutes, hours, days)     yesterday 2. LOCATION: What part of the eyelids is swollen?     Entire eye lid 3. SEVERITY: How swollen is it?     Not swollen shut 4. ITCHING: Is there any itching? If Yes, ask: How much?   (Scale 1-10; mild, moderate or severe)     no 5. PAIN: Is the swelling painful to touch? If Yes, ask: How painful is it?   (Scale 1-10; mild, moderate or severe)     Moderate pain 6. FEVER: Do you have a fever? If Yes, ask: What is it, how was it measured, and when did it start?      no 7. CAUSE: What do you think is causing the swelling?     Used expired contact lens solution yesterday 8. RECURRENT SYMPTOM: Have you had eyelid swelling before? If Yes, ask: When was the last time? What happened that time?     no 9. OTHER SYMPTOMS: Do you have any other symptoms? (e.g., blurred vision, eye discharge, rash, runny nose)     Eye watery, sclera red  Protocols used: Eye - Swelling-A-AH  "

## 2024-05-29 LAB — VITAMIN D 25 HYDROXY (VIT D DEFICIENCY, FRACTURES): Vit D, 25-Hydroxy: 22.5 ng/mL — ABNORMAL LOW (ref 30.0–100.0)

## 2024-05-29 LAB — CMP14+EGFR
ALT: 14 IU/L (ref 0–32)
AST: 12 IU/L (ref 0–40)
Albumin: 4.2 g/dL (ref 3.9–4.9)
Alkaline Phosphatase: 89 IU/L (ref 41–116)
BUN/Creatinine Ratio: 13 (ref 9–23)
BUN: 12 mg/dL (ref 6–20)
Bilirubin Total: 0.3 mg/dL (ref 0.0–1.2)
CO2: 23 mmol/L (ref 20–29)
Calcium: 8.9 mg/dL (ref 8.7–10.2)
Chloride: 101 mmol/L (ref 96–106)
Creatinine, Ser: 0.95 mg/dL (ref 0.57–1.00)
Globulin, Total: 3.1 g/dL (ref 1.5–4.5)
Glucose: 80 mg/dL (ref 70–99)
Potassium: 3.9 mmol/L (ref 3.5–5.2)
Sodium: 137 mmol/L (ref 134–144)
Total Protein: 7.3 g/dL (ref 6.0–8.5)
eGFR: 81 mL/min/1.73

## 2024-05-29 LAB — LIPID PANEL
Chol/HDL Ratio: 4.7 ratio — ABNORMAL HIGH (ref 0.0–4.4)
Cholesterol, Total: 219 mg/dL — ABNORMAL HIGH (ref 100–199)
HDL: 47 mg/dL
LDL Chol Calc (NIH): 149 mg/dL — ABNORMAL HIGH (ref 0–99)
Triglycerides: 125 mg/dL (ref 0–149)
VLDL Cholesterol Cal: 23 mg/dL (ref 5–40)

## 2024-05-29 LAB — HEMOGLOBIN A1C
Est. average glucose Bld gHb Est-mCnc: 103 mg/dL
Hgb A1c MFr Bld: 5.2 % (ref 4.8–5.6)

## 2024-05-31 DIAGNOSIS — E063 Autoimmune thyroiditis: Secondary | ICD-10-CM

## 2024-06-03 ENCOUNTER — Ambulatory Visit: Payer: Self-pay | Admitting: Family Medicine

## 2024-06-03 DIAGNOSIS — E559 Vitamin D deficiency, unspecified: Secondary | ICD-10-CM

## 2024-06-03 LAB — VITAMIN B12: Vitamin B-12: 354 pg/mL (ref 232–1245)

## 2024-06-03 LAB — TSH: TSH: 29.5 u[IU]/mL — ABNORMAL HIGH (ref 0.450–4.500)

## 2024-06-03 LAB — T4, FREE: Free T4: 0.56 ng/dL — ABNORMAL LOW (ref 0.82–1.77)

## 2024-06-03 MED ORDER — VITAMIN D (ERGOCALCIFEROL) 1.25 MG (50000 UNIT) PO CAPS: 50000.0000 [IU] | ORAL_CAPSULE | ORAL | 1 refills | Status: AC

## 2024-06-09 ENCOUNTER — Inpatient Hospital Stay: Attending: Hematology

## 2024-06-09 DIAGNOSIS — D75839 Thrombocytosis, unspecified: Secondary | ICD-10-CM

## 2024-06-09 DIAGNOSIS — D500 Iron deficiency anemia secondary to blood loss (chronic): Secondary | ICD-10-CM

## 2024-06-09 LAB — IRON AND TIBC
Iron: 48 ug/dL (ref 28–170)
Saturation Ratios: 17 % (ref 10.4–31.8)
TIBC: 286 ug/dL (ref 250–450)
UIBC: 238 ug/dL

## 2024-06-09 LAB — CBC WITH DIFFERENTIAL/PLATELET
Abs Immature Granulocytes: 0.03 K/uL (ref 0.00–0.07)
Basophils Absolute: 0.1 K/uL (ref 0.0–0.1)
Basophils Relative: 1 %
Eosinophils Absolute: 0.3 K/uL (ref 0.0–0.5)
Eosinophils Relative: 3 %
HCT: 42.2 % (ref 36.0–46.0)
Hemoglobin: 14.2 g/dL (ref 12.0–15.0)
Immature Granulocytes: 0 %
Lymphocytes Relative: 30 %
Lymphs Abs: 2.9 K/uL (ref 0.7–4.0)
MCH: 30.1 pg (ref 26.0–34.0)
MCHC: 33.6 g/dL (ref 30.0–36.0)
MCV: 89.4 fL (ref 80.0–100.0)
Monocytes Absolute: 0.6 K/uL (ref 0.1–1.0)
Monocytes Relative: 6 %
Neutro Abs: 5.6 K/uL (ref 1.7–7.7)
Neutrophils Relative %: 60 %
Platelets: 443 K/uL — ABNORMAL HIGH (ref 150–400)
RBC: 4.72 MIL/uL (ref 3.87–5.11)
RDW: 13.3 % (ref 11.5–15.5)
WBC: 9.4 K/uL (ref 4.0–10.5)
nRBC: 0 % (ref 0.0–0.2)

## 2024-06-09 LAB — FERRITIN: Ferritin: 144 ng/mL (ref 11–307)

## 2024-06-10 ENCOUNTER — Encounter: Payer: Self-pay | Admitting: Nurse Practitioner

## 2024-06-10 ENCOUNTER — Ambulatory Visit: Admitting: Nurse Practitioner

## 2024-06-10 VITALS — BP 112/76 | HR 98 | Ht 67.0 in | Wt 296.0 lb

## 2024-06-10 DIAGNOSIS — E063 Autoimmune thyroiditis: Secondary | ICD-10-CM | POA: Diagnosis not present

## 2024-06-10 NOTE — Progress Notes (Signed)
 "                                                                    Endocrinology Follow Up Note                                         06/10/2024, 8:48 AM  Subjective:   Subjective    Kristin Baker is a 34 y.o.-year-old female patient being seen in follow up after being seen in consultation for hypothyroidism referred by Edman Meade PEDLAR, FNP.   Past Medical History:  Diagnosis Date   Abdominal pain    ADHD    ANA positive    Back pain    DDD (degenerative disc disease), lumbar    Eye pain, right    Fatigue    Frontal sinusitis    Hypertension    Hypothyroidism    Iron deficiency anemia due to chronic blood loss 06/19/2022   Medical history non-contributory    Obesity, morbid (HCC)    Palpitations    Rheumatoid arthritis [facility])     Past Surgical History:  Procedure Laterality Date   ABLATION     05/22/2020 left side, 06/14/2020 right side    BIOPSY  02/17/2023   Procedure: BIOPSY;  Surgeon: Cinderella Deatrice FALCON, MD;  Location: [facility];  Service: Endoscopy;;   CHOLECYSTECTOMY     COLONOSCOPY WITH PROPOFOL  N/A 02/17/2023   Procedure: COLONOSCOPY WITH PROPOFOL ;  Surgeon: Cinderella Deatrice FALCON, MD;  Location: [facility];  Service: Endoscopy;  Laterality: N/A;  9:45am;asa 2   ESOPHAGOGASTRODUODENOSCOPY (EGD) WITH PROPOFOL  N/A 02/17/2023   Procedure: ESOPHAGOGASTRODUODENOSCOPY (EGD) WITH PROPOFOL ;  Surgeon: Cinderella Deatrice FALCON, MD;  Location: [facility];  Service: Endoscopy;  Laterality: N/A;  9:45am;asa 2   HYSTERECTOMY ABDOMINAL WITH SALPINGECTOMY     POLYPECTOMY  02/17/2023   Procedure: POLYPECTOMY;  Surgeon: Cinderella Deatrice FALCON, MD;  Location: [facility];  Service: Endoscopy;;   ROBOTIC ASSISTED TOTAL HYSTERECTOMY Bilateral 07/30/2022   Procedure: XI ROBOTIC ASSISTED TOTAL HYSTERECTOMY AND BILATERAL SALPINGECTOMY;  Surgeon: Jayne Vonn DEL, MD;  Location: [facility];  Service: Gynecology;  Laterality: Bilateral;    Social History   Socioeconomic History   Marital  status: Married    Spouse name: Not on file   Number of children: 1   Years of education: Not on file   Highest education level: Associate degree: occupational, scientist, product/process development, or vocational program  Occupational History   Not on file  Tobacco Use   Smoking status: Former    Current packs/day: 0.00    Average packs/day: 1 pack/day for 15.0 years (15.0 ttl pk-yrs)    Types: Cigarettes, E-cigarettes    Start date: 11/24/2005    Quit date: 11/24/2020    Years since quitting: 3.5    Passive exposure: Current   Smokeless tobacco: Never   Tobacco comments:    She has quit of and on.  Vaping Use   Vaping status: Some Days  Substance and Sexual Activity   Alcohol use: Yes    Comment: occasionally /social events   Drug use: Not Currently    Comment: smoked weed during teen years  Sexual activity: Not Currently    Birth control/protection: Surgical    Comment: hyst  Other Topics Concern   Not on file  Social History Narrative   Married for May 2021.Lives with husband and daughter.Homemaker.Husband works at [employer].   Social Drivers of Health   Tobacco Use: Medium Risk (06/10/2024)   Patient History    Smoking Tobacco Use: Former    Smokeless Tobacco Use: Never    Passive Exposure: Current  Physicist, Medical Strain: Medium Risk (05/06/2024)   Overall Financial Resource Strain (CARDIA)    Difficulty of Paying Living Expenses: Somewhat hard  Food Insecurity: Food Insecurity Present (05/06/2024)   Epic    Worried About Programme Researcher, Broadcasting/film/video in the Last Year: Sometimes true    Ran Out of Food in the Last Year: Never true  Transportation Needs: No Transportation Needs (05/06/2024)   Epic    Lack of Transportation (Medical): No    Lack of Transportation (Non-Medical): No  Physical Activity: Insufficiently Active (05/06/2024)   Exercise Vital Sign    Days of Exercise per Week: 3 days    Minutes of Exercise per Session: 30 min  Stress: Stress Concern Present (05/06/2024)    [employer] of Occupational Health - Occupational Stress Questionnaire    Feeling of Stress: To some extent  Social Connections: Moderately Isolated (05/06/2024)   Social Connection and Isolation Panel    Frequency of Communication with Friends and Family: More than three times a week    Frequency of Social Gatherings with Friends and Family: Twice a week    Attends Religious Services: Never    Database Administrator or Organizations: No    Attends Engineer, Structural: Not on file    Marital Status: Married  Depression (PHQ2-9): Medium Risk (05/06/2024)   Depression (PHQ2-9)    PHQ-2 Score: 8  Alcohol Screen: Low Risk (05/06/2024)   Alcohol Screen    Last Alcohol Screening Score (AUDIT): 2  Housing: Low Risk (05/06/2024)   Epic    Unable to Pay for Housing in the Last Year: No    Number of Times Moved in the Last Year: 0    Homeless in the Last Year: No  Utilities: Low Risk (03/01/2023)   Received from [facility]   Utilities    In the past 12 months has the electric, gas, oil, or water  company threatened to shut off services in your home? : No  Health Literacy: Not on file    Family History  Problem Relation Age of Onset   Cervical cancer Maternal Grandmother    Breast cancer Maternal Grandmother    Skin cancer Maternal Grandmother    Heart attack Maternal Grandfather    Diabetes Father    Heart attack Father    Hyperlipidemia Mother    Peripheral Artery Disease Mother    ADD / ADHD Mother    Bipolar disorder Mother    Cervical cancer Mother        at age 4.   ADD / ADHD Sister    Healthy Daughter    Breast cancer Maternal Aunt    Cervical cancer Maternal Aunt    ADD / ADHD Maternal Aunt    Uterine cancer Maternal Aunt    Breast cancer Maternal Aunt    Multiple sclerosis Paternal Aunt    Colon cancer Neg Hx     Outpatient Encounter Medications as of 06/10/2024  Medication Sig   acetaminophen  (TYLENOL ) 500 MG tablet Take 2 tablets (  1,000 mg  total) by mouth every 6 (six) hours. (Patient taking differently: Take 1,000 mg by mouth every 6 (six) hours. As needed)   albuterol  (VENTOLIN  HFA) 108 (90 Base) MCG/ACT inhaler Inhale 2 puffs into the lungs every 6 (six) hours as needed for wheezing or shortness of breath.   amphetamine -dextroamphetamine  (ADDERALL XR) 20 MG 24 hr capsule Take 20 mg by mouth daily.   amphetamine -dextroamphetamine  (ADDERALL) 5 MG tablet Take 5 mg by mouth daily in the afternoon.   Fremanezumab -vfrm (AJOVY ) 225 MG/1.5ML SOAJ Inject 225 mg into the skin every 30 (thirty) days.   levocetirizine (XYZAL ) 5 MG tablet TAKE 1 TABLET(5 MG) BY MOUTH DAILY   levothyroxine  (SYNTHROID ) 150 MCG tablet TAKE 1 TABLET(150 MCG) BY MOUTH DAILY BEFORE BREAKFAST   lidocaine  (XYLOCAINE ) 2 % solution Use as directed 15 mLs in the mouth or throat every 3 (three) hours as needed for mouth pain. Apply topically to affected areas as needed for dental pain   montelukast  (SINGULAIR ) 10 MG tablet Take 1 tablet (10 mg total) by mouth at bedtime.   olmesartan -hydrochlorothiazide  (BENICAR  HCT) 20-12.5 MG tablet Take 1 tablet by mouth daily.   ondansetron  (ZOFRAN ) 4 MG tablet Take 1 tablet (4 mg total) by mouth every 8 (eight) hours as needed for nausea or vomiting.   tiZANidine  (ZANAFLEX ) 4 MG tablet Take 1 tablet (4 mg total) by mouth at bedtime.   Vitamin D , Ergocalciferol , (DRISDOL ) 1.25 MG (50000 UNIT) CAPS capsule Take 1 capsule (50,000 Units total) by mouth every 7 (seven) days.   No facility-administered encounter medications on file as of 06/10/2024.    ALLERGIES: Allergies  Allergen Reactions   Poison Ivy Extract Anaphylaxis   Poison Oak Extract Anaphylaxis   Anser Anser Feather (Goose) Allergy Skin Test    Flexeril [Cyclobenzaprine] Hives   Lactose Intolerance (Gi) Diarrhea   Honeysuckle Flower [Lonicera] Rash   Mixed Grasses Rash   VACCINATION STATUS: Immunization History  Administered Date(s) Administered   Moderna  Sars-Covid-2 Vaccination 10/08/2019, 11/05/2019   Tdap 08/31/2020     HPI   Kristin Baker  is a patient with the above medical history. she was diagnosed with hypothyroidism at approximate age of 26 years, which required subsequent initiation of thyroid  hormone replacement therapy. She was first diagnosed when she was with Dr. Caswell (or his NP Lauraine Pereyra).  After his passing, her care was transferred to her current PCP.  she was given various doses of Levothyroxine  over the years, currently on 125 micrograms.  This has been changed several times by her PCP recently. she reports compliance to this medication:  but she does note she takes it with her BP and allergy pill in the morning.  She does take iron supplement, but takes that at night.  She notes her PCP told her it was ok to take all her medications together.  I reviewed patient's thyroid  tests:  Lab Results  Component Value Date   TSH 29.500 (H) 06/02/2024   TSH 1.860 03/02/2024   TSH 4.890 (H) 11/20/2023   TSH 6.410 (H) 08/29/2023   TSH 1.160 08/13/2023   TSH 8.230 (H) 04/28/2023   TSH 6.470 (H) 03/10/2023   TSH 8.570 (H) 01/23/2023   TSH 8.360 (H) 09/23/2022   TSH 2.530 04/26/2022   FREET4 0.56 (L) 06/02/2024   FREET4 1.34 03/02/2024   FREET4 0.69 (L) 11/20/2023   FREET4 1.08 08/29/2023   FREET4 1.13 08/13/2023   FREET4 1.02 04/28/2023   FREET4 0.94 03/10/2023   FREET4  1.25 01/23/2023   FREET4 0.70 (L) 09/23/2022   FREET4 1.13 04/26/2022    Pt denies feeling nodules in neck, hoarseness, SOB with lying down.  she does have extensive family history of autoimmune disorders and has family history of thyroid  disorders in her uncle (total thyroidectomy for unknown reason), grandmother (? Hypothyroidism) and mom (recently diagnosed with thyroid  issues).  She did report her uncle has history of thyroid  cancer (requiring total thyroidectomy).  No history of radiation therapy to head or neck.  No recent use of iodine   supplements.  Denies use of Biotin containing supplements.  I reviewed her chart and she also has a history of migraines, HTN, constipation, depression/anxiety, HLD, anemia, and vitamin d  deficiency.  Review of systems  Constitutional: + Minimally fluctuating body weight,  current Body mass index is 46.36 kg/m. , no fatigue, no subjective hyperthermia, no subjective hypothermia Eyes: no blurry vision, no xerophthalmia ENT: no sore throat, no nodules palpated in throat, no dysphagia/odynophagia, no hoarseness Cardiovascular: no chest pain, no shortness of breath, no palpitations, no leg swelling Respiratory: no cough, no shortness of breath Gastrointestinal: no nausea/vomiting/diarrhea Musculoskeletal: no muscle/joint aches Skin: no rashes, no hyperemia Neurological: no tremors, no numbness, no tingling, no dizziness Psychiatric: no depression, no anxiety   Objective:   Objective     BP 112/76 (BP Location: Left Arm, Patient Position: Sitting, Cuff Size: Large)   Pulse 98   Ht 5' 7 (1.702 m)   Wt 296 lb (134.3 kg)   LMP 03/10/2022   BMI 46.36 kg/m  Wt Readings from Last 3 Encounters:  06/10/24 296 lb (134.3 kg)  03/08/24 288 lb (130.6 kg)  12/29/23 291 lb (132 kg)    BP Readings from Last 3 Encounters:  06/10/24 112/76  03/08/24 114/78  12/29/23 124/85      Physical Exam- Limited  Constitutional:  Body mass index is 46.36 kg/m. , not in acute distress, normal state of mind Eyes:  EOMI, no exophthalmos Musculoskeletal: no gross deformities, strength intact in all four extremities, no gross restriction of joint movements Skin:  no rashes, no hyperemia Neurological: no tremor with outstretched hands   CMP ( most recent) CMP     Component Value Date/Time   NA 137 05/28/2024 0824   K 3.9 05/28/2024 0824   CL 101 05/28/2024 0824   CO2 23 05/28/2024 0824   GLUCOSE 80 05/28/2024 0824   GLUCOSE 97 03/08/2024 0812   BUN 12 05/28/2024 0824   CREATININE 0.95  05/28/2024 0824   CREATININE 0.73 03/14/2023 0910   CALCIUM 8.9 05/28/2024 0824   PROT 7.3 05/28/2024 0824   ALBUMIN 4.2 05/28/2024 0824   AST 12 05/28/2024 0824   ALT 14 05/28/2024 0824   ALKPHOS 89 05/28/2024 0824   BILITOT 0.3 05/28/2024 0824   EGFR 81 05/28/2024 0824   GFRNONAA >60 03/08/2024 0812   GFRNONAA 105 10/04/2020 0000     Diabetic Labs (most recent): Lab Results  Component Value Date   HGBA1C 5.2 05/28/2024   HGBA1C 5.4 08/29/2023   HGBA1C 5.4 04/28/2023     Lipid Panel ( most recent) Lipid Panel     Component Value Date/Time   CHOL 219 (H) 05/28/2024 0824   TRIG 125 05/28/2024 0824   HDL 47 05/28/2024 0824   CHOLHDL 4.7 (H) 05/28/2024 0824   CHOLHDL 7.0 (H) 01/11/2020 1154   LDLCALC 149 (H) 05/28/2024 0824   LDLCALC 166 (H) 01/11/2020 1154   LABVLDL 23 05/28/2024 9175  Lab Results  Component Value Date   TSH 29.500 (H) 06/02/2024   TSH 1.860 03/02/2024   TSH 4.890 (H) 11/20/2023   TSH 6.410 (H) 08/29/2023   TSH 1.160 08/13/2023   TSH 8.230 (H) 04/28/2023   TSH 6.470 (H) 03/10/2023   TSH 8.570 (H) 01/23/2023   TSH 8.360 (H) 09/23/2022   TSH 2.530 04/26/2022   FREET4 0.56 (L) 06/02/2024   FREET4 1.34 03/02/2024   FREET4 0.69 (L) 11/20/2023   FREET4 1.08 08/29/2023   FREET4 1.13 08/13/2023   FREET4 1.02 04/28/2023   FREET4 0.94 03/10/2023   FREET4 1.25 01/23/2023   FREET4 0.70 (L) 09/23/2022   FREET4 1.13 04/26/2022    Thyroid  US  from 07/31/23 CLINICAL DATA:  Hashimoto's thyroiditis   EXAM: THYROID  ULTRASOUND   TECHNIQUE: Ultrasound examination of the thyroid  gland and adjacent soft tissues was performed.   COMPARISON:  None Available.   FINDINGS: Parenchymal Echotexture: Moderately heterogenous   Isthmus: 2 mm   Right lobe: 4.3 x 1.8 x 1.1 cm   Left lobe: 3.5 x 1.2 x 1.3 cm   _________________________________________________________   Estimated total number of nodules >/= 1 cm: 0   Number of spongiform nodules >/=   2 cm not described below (TR1): 0   Number of mixed cystic and solid nodules >/= 1.5 cm not described below (TR2): 0   _________________________________________________________   Moderate diffuse thyroid  heterogeneity without hypervascularity compatible with chronic medical thyroid  disease.   Nodule # 1:   Location: Left; Mid   Maximum size: 0.9 cm; Other 2 dimensions: 0.8 x 0.7 cm   Composition: solid/almost completely solid (2)   Echogenicity: isoechoic (1)   Shape: not taller-than-wide (0)   Margins: ill-defined (0)   Echogenic foci: none (0)   ACR TI-RADS total points: 3.   ACR TI-RADS risk category: TR3 (3 points).   ACR TI-RADS recommendations:   Given size (<1.4 cm) and appearance, this nodule does NOT meet TI-RADS criteria for biopsy or dedicated follow-up.   _________________________________________________________   Nodule # 2:   Location: Left; Mid   Maximum size: 0.9 cm; Other 2 dimensions: 0.8 x 0.8 cm   Composition: solid/almost completely solid (2)   Echogenicity: isoechoic (1)   Shape: not taller-than-wide (0)   Margins: ill-defined (0)   Echogenic foci: peripheral calcifications (2)   ACR TI-RADS total points: 5.   ACR TI-RADS risk category: TR4 (4-6 points).   ACR TI-RADS recommendations:   Given size (<0.9 cm) and appearance, this nodule does NOT meet TI-RADS criteria for biopsy or dedicated follow-up.   _________________________________________________________   No additional right thyroid  abnormality or nodule. No regional adenopathy.   IMPRESSION: 1. Moderate diffuse thyroid  heterogeneity compatible with chronic medical thyroid  disease. 2. 0.9 cm left mid thyroid  adjacent TR 3 and TR 4 nodules as above, which do not meet criteria for biopsy or dedicated follow-up.     Electronically Signed   By: CHRISTELLA.  Shick M.D.   On: 08/06/2023 11:18     Latest Reference Range & Units 08/13/23 08:55 08/29/23 09:24 11/20/23 10:23  03/02/24 08:41 06/02/24 08:16  TSH 0.450 - 4.500 uIU/mL 1.160 6.410 (H) 4.890 (H) 1.860 29.500 (H)  T4,Free(Direct) 0.82 - 1.77 ng/dL 8.86 8.91 9.30 (L) 8.65 0.56 (L)  (H): Data is abnormally high (L): Data is abnormally low  Assessment & Plan:   ASSESSMENT / PLAN:  1. Hypothyroidism- due to Hashimoto's thyroiditis 2. Multinodular thyroid   Patient with long-standing hypothyroidism, on levothyroxine  therapy. On physical exam,  patient does not have gross goiter, thyroid  nodules, or neck compression symptoms.  Positive TPO antibodies confirm autoimmune etiology.  -Her previsit TFTs are consistent with under-replacement however she is feeling pretty good.  She is advised to continue Levothyroxine  150 mcg po daily before breakfast for now. Will recheck labs in 3 months and adjust dose accordingly.  She has been more stressed recently, denies any recent steroid use or illnesses.  She has been consistent with taking her meds as well.  Her pattern of thyroid  labs tend to fluctuate but go back to normal without changing doses.  - We discussed about correct intake of levothyroxine , at fasting, with water , separated by at least 30 minutes from breakfast, and separated by more than 4 hours from calcium, iron, multivitamins, acid reflux medications (PPIs). -Patient is made aware of the fact that thyroid  hormone replacement is needed for life, dose to be adjusted by periodic monitoring of thyroid  function tests.  Her thyroid  ultrasound shows moderate heterogeneity consistent with chronic medical thyroid  disease.  There were 2 nodules which do not meet criteria for additional surveillance or biopsy.      I spent  23  minutes in the care of the patient today including review of labs from Thyroid  Function, CMP, and other relevant labs ; imaging/biopsy records (current and previous including abstractions from other facilities); face-to-face time discussing  her lab results and symptoms, medications doses,  her options of short and long term treatment based on the latest standards of care / guidelines;   and documenting the encounter.  Clotilda Rower  participated in the discussions, expressed understanding, and voiced agreement with the above plans.  All questions were answered to her satisfaction. she is encouraged to contact clinic should she have any questions or concerns prior to her return visit.   FOLLOW UP PLAN:  Return in about 3 months (around 09/08/2024) for Thyroid  follow up, Previsit labs.  Benton Rio, [facility] [address] [place], [place] [address] Phone: [phone] Fax: [phone]  06/10/2024, 8:48 AM  "

## 2024-06-10 NOTE — Patient Instructions (Signed)

## 2024-06-11 NOTE — Progress Notes (Signed)
 "  [facility] [address], [place] [address]   CLINIC:  Medical Oncology/Hematology  PCP:  Edman Meade PEDLAR, FNP 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 34 y.o. 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 Last seen by Pleasant Barefoot PA-C 12/09/2023. Last IV iron with Feraheme  x 2 in July 2025.  She continues to have ongoing fatigue, which has worsened over the past week which she attributes to difficulty sleeping. No dyspnea, chest pain, lightheadedness, or headaches.   She has aquagenic pruritus and Raynaud's phenomenon. No history of DVT or PE.  She has bilateral leg swelling, tends to be worse in the summer; left slightly more swollen than right, but without any significant appreciable difference on exam. She continues to vape daily.   She no longer has periods, as she is s/p total abdominal hysterectomy in March 2024.   She previously had scant rectal bleeding associated with constipation, but has not noticed this recently. No gross hematochezia or melena.  She has not taken iron pill for the past few months due to forgetting.   She has 40% energy and 100% appetite.  She endorses that she is maintaining a stable weight.  ASSESSMENT & PLAN:  1.   Thrombocytosis - Progressively elevated platelets since February 2022. Labs from 05/24/2022 show platelets 607 in the context of borderline anemia (Hgb 10.4) and borderline microcytosis (MCV 80). Iron studies (05/06/2022) show iron deficiency with ferritin 18 and iron saturation 5%. - She vapes daily. - She is morbidly obese. - She is being worked up by rheumatology for possible autoimmune disease (thoracic spine pain, history of previous ANA positive in 2021). - JAK2 with reflex to CALR, MPL, and  E12-15 was NEGATIVE.  Hematology workup showed negative ANA and rheumatoid factor, but with elevated ESR and CRP. - No history of DVT or PE. - No B symptoms.  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 - Most recent labs (06/09/2024): Platelets mildly elevated 443.  Ferritin normal at 144 with iron saturation 17% - DIFFERENTIAL DIAGNOSIS favors reactive thrombocytosis in the setting of iron deficiency, vaping, obesity, and possible autoimmune/inflammatory disease. - PLAN: No evidence of MPN or clonal thrombocytosis at this time.  Continue monitoring with periodic CBCs.  Continue treatment of iron deficiency as below.  Continue to encourage the patient to stop vaping.   2.  Iron deficiency anemia - Total abdominal hysterectomy in March 2024 due to severe abnormal uterine bleeding - Severe anemia in December 2023 with Hgb down to 7.7. - EGD (02/17/2023): Normal esophagus, gastritis.  Scalloped mucosa in duodenum (pathology benign with no significant pathologic changes). - Colonoscopy (02/17/2023): Polyps x 3 (hyperplastic polyps, no dysplasia or malignancy).  Nonbleeding external and internal hemorrhoids. - Intermittent mild hemorrhoid bleeding - Previously donated blood regularly, no blood donation for the past year. - No prior history of blood transfusion. - Taking iron tablet daily since November 2023.  Most recent IV iron with Feraheme  x 2 in 2025.  Reports energy improves after IV iron. - Most recent labs (06/09/2024): Hgb 14.2/MCV 89.4, ferritin 144, iron saturation 17% - PLAN: No indication for IV iron. - Continue EOD iron supplement - Repeat CBC/D, ferritin, iron/TIBC in 6 months followed by OFFICE visit.  If no IV iron needed at next appointment, consider discharge  to PCP.     3.  Family history - Strong family history of breast, uterine, and ovarian cancer on her mother's side of the family: Mother had endometrial cancer Maternal aunt with breast cancer Maternal aunt with  ovarian cancer and breast cancer Maternal grandmother with breast cancer and uterine cancer.  - PLAN: Patient was referred to genetic counselor, but did not show up for appointment.       4.  Other history - PMH: Abnormal uterine bleeding, ADHD, PCOS, degenerative disc disease, hypertension, hypothyroidism, asthma, morbid obesity, anxiety/depression - SOCIAL: She lives at home with her husband and daughter.  She is currently unemployed.  She is a former smoker, switched to vaping 2 years ago and vapes frequently throughout the day.  She drinks occasional alcohol.  She denies any illicit drug use. - FAMILY: Strong family history of breast, uterine, and ovarian cancer on mother side of the family, as noted above.   PLAN SUMMARY: >> Labs in 6 months = CBC/D, ferritin, iron/TIBC >> OFFICE visit in 6 months     REVIEW OF SYSTEMS:  Review of Systems  Constitutional:  Positive for fatigue. Negative for appetite change, chills, diaphoresis, fever and unexpected weight change.  HENT:   Negative for lump/mass and nosebleeds.   Eyes:  Negative for eye problems.  Respiratory:  Negative for cough, hemoptysis and shortness of breath.   Cardiovascular:  Negative for chest pain, leg swelling and palpitations.  Gastrointestinal:  Negative for abdominal pain, blood in stool, constipation, diarrhea, nausea and vomiting.  Endocrine: Negative for hot flashes.  Genitourinary:  Negative for hematuria.   Musculoskeletal:  Positive for back pain. Negative for arthralgias.  Skin: Negative.   Neurological:  Positive for numbness. Negative for dizziness, headaches and light-headedness.  Hematological:  Does not bruise/bleed easily.  Psychiatric/Behavioral:  Positive for depression and sleep disturbance. The patient is nervous/anxious.      PHYSICAL EXAM:  ECOG PERFORMANCE STATUS: 1 - Symptomatic but completely ambulatory  Vitals:   06/16/24 0846 06/16/24 0855  BP: (!) 141/95 (!) 141/81  Pulse: 67   Resp:  18   Temp: 98.8 F (37.1 C)   SpO2: 97%     Filed Weights   06/16/24 0846  Weight: 296 lb (134.3 kg)    Physical Exam Constitutional:      Appearance: Normal appearance. She is morbidly obese.  Cardiovascular:     Heart sounds: Normal heart sounds.  Pulmonary:     Breath sounds: Normal breath sounds.  Musculoskeletal:     Right lower leg: Edema (non-pitting) present.     Left lower leg: Edema (non-pitting) present.  Neurological:     General: No focal deficit present.     Mental Status: Mental status is at baseline.  Psychiatric:        Behavior: Behavior normal. Behavior is cooperative.     PAST MEDICAL/SURGICAL HISTORY:  Past Medical History:  Diagnosis Date   Abdominal pain    ADHD    ANA positive    Back pain    DDD (degenerative disc disease), lumbar    Eye pain, right    Fatigue    Frontal sinusitis    Hypertension    Hypothyroidism    Iron deficiency anemia due to chronic blood loss 06/19/2022   Medical history non-contributory    Obesity, morbid (HCC)    Palpitations    Rheumatoid arthritis (HCC)    Past Surgical History:  Procedure Laterality Date   ABLATION  05/22/2020 left side, 06/14/2020 right side    BIOPSY  02/17/2023   Procedure: BIOPSY;  Surgeon: Cinderella Deatrice FALCON, MD;  Location: [facility];  Service: Endoscopy;;   CHOLECYSTECTOMY     COLONOSCOPY WITH PROPOFOL  N/A 02/17/2023   Procedure: COLONOSCOPY WITH PROPOFOL ;  Surgeon: Cinderella Deatrice FALCON, MD;  Location: [facility];  Service: Endoscopy;  Laterality: N/A;  9:45am;asa 2   ESOPHAGOGASTRODUODENOSCOPY (EGD) WITH PROPOFOL  N/A 02/17/2023   Procedure: ESOPHAGOGASTRODUODENOSCOPY (EGD) WITH PROPOFOL ;  Surgeon: Cinderella Deatrice FALCON, MD;  Location: [facility];  Service: Endoscopy;  Laterality: N/A;  9:45am;asa 2   HYSTERECTOMY ABDOMINAL WITH SALPINGECTOMY     POLYPECTOMY  02/17/2023   Procedure: POLYPECTOMY;  Surgeon: Cinderella Deatrice FALCON, MD;  Location: [facility];  Service: Endoscopy;;    ROBOTIC ASSISTED TOTAL HYSTERECTOMY Bilateral 07/30/2022   Procedure: XI ROBOTIC ASSISTED TOTAL HYSTERECTOMY AND BILATERAL SALPINGECTOMY;  Surgeon: Jayne Vonn DEL, MD;  Location: [facility];  Service: Gynecology;  Laterality: Bilateral;    SOCIAL HISTORY:  Social History   Socioeconomic History   Marital status: Married    Spouse name: Not on file   Number of children: 1   Years of education: Not on file   Highest education level: Associate degree: occupational, scientist, product/process development, or vocational program  Occupational History   Not on file  Tobacco Use   Smoking status: Former    Current packs/day: 0.00    Average packs/day: 1 pack/day for 15.0 years (15.0 ttl pk-yrs)    Types: Cigarettes, E-cigarettes    Start date: 11/24/2005    Quit date: 11/24/2020    Years since quitting: 3.5    Passive exposure: Current   Smokeless tobacco: Never   Tobacco comments:    She has quit of and on.  Vaping Use   Vaping status: Some Days  Substance and Sexual Activity   Alcohol use: Yes    Comment: occasionally /social events   Drug use: Not Currently    Comment: smoked weed during teen years   Sexual activity: Not Currently    Birth control/protection: Surgical    Comment: hyst  Other Topics Concern   Not on file  Social History Narrative   Married for May 2021.Lives with husband and daughter.Homemaker.Husband works at [employer].   Social Drivers of Health   Tobacco Use: Medium Risk (06/10/2024)   Patient History    Smoking Tobacco Use: Former    Smokeless Tobacco Use: Never    Passive Exposure: Current  Physicist, Medical Strain: Medium Risk (05/06/2024)   Overall Financial Resource Strain (CARDIA)    Difficulty of Paying Living Expenses: Somewhat hard  Food Insecurity: Food Insecurity Present (05/06/2024)   Epic    Worried About Programme Researcher, Broadcasting/film/video in the Last Year: Sometimes true    Ran Out of Food in the Last Year: Never true  Transportation Needs: No Transportation Needs  (05/06/2024)   Epic    Lack of Transportation (Medical): No    Lack of Transportation (Non-Medical): No  Physical Activity: Insufficiently Active (05/06/2024)   Exercise Vital Sign    Days of Exercise per Week: 3 days    Minutes of Exercise per Session: 30 min  Stress: Stress Concern Present (05/06/2024)   [employer] of Occupational Health - Occupational Stress Questionnaire    Feeling of Stress: To some extent  Social Connections: Moderately Isolated (05/06/2024)   Social Connection and Isolation Panel    Frequency of Communication with Friends and Family: More than three  times a week    Frequency of Social Gatherings with Friends and Family: Twice a week    Attends Religious Services: Never    Database Administrator or Organizations: No    Attends Engineer, Structural: Not on file    Marital Status: Married  Catering Manager Violence: Not At Risk (02/27/2023)   Humiliation, Afraid, Rape, and Kick questionnaire    Fear of Current or Ex-Partner: No    Emotionally Abused: No    Physically Abused: No    Sexually Abused: No  Depression (PHQ2-9): Low Risk (06/16/2024)   Depression (PHQ2-9)    PHQ-2 Score: 0  Recent Concern: Depression (PHQ2-9) - Medium Risk (05/06/2024)   Depression (PHQ2-9)    PHQ-2 Score: 8  Alcohol Screen: Low Risk (05/06/2024)   Alcohol Screen    Last Alcohol Screening Score (AUDIT): 2  Housing: Low Risk (05/06/2024)   Epic    Unable to Pay for Housing in the Last Year: No    Number of Times Moved in the Last Year: 0    Homeless in the Last Year: No  Utilities: Low Risk (03/01/2023)   Received from [facility]   Utilities    In the past 12 months has the electric, gas, oil, or water  company threatened to shut off services in your home? : No  Health Literacy: Not on file    FAMILY HISTORY:  Family History  Problem Relation Age of Onset   Cervical cancer Maternal Grandmother    Breast cancer Maternal Grandmother    Skin cancer Maternal  Grandmother    Heart attack Maternal Grandfather    Diabetes Father    Heart attack Father    Hyperlipidemia Mother    Peripheral Artery Disease Mother    ADD / ADHD Mother    Bipolar disorder Mother    Cervical cancer Mother        at age 40.   ADD / ADHD Sister    Healthy Daughter    Breast cancer Maternal Aunt    Cervical cancer Maternal Aunt    ADD / ADHD Maternal Aunt    Uterine cancer Maternal Aunt    Breast cancer Maternal Aunt    Multiple sclerosis Paternal Aunt    Colon cancer Neg Hx     CURRENT MEDICATIONS:  Outpatient Encounter Medications as of 06/16/2024  Medication Sig   acetaminophen  (TYLENOL ) 500 MG tablet Take 2 tablets (1,000 mg total) by mouth every 6 (six) hours. (Patient taking differently: Take 1,000 mg by mouth every 6 (six) hours. As needed)   albuterol  (VENTOLIN  HFA) 108 (90 Base) MCG/ACT inhaler Inhale 2 puffs into the lungs every 6 (six) hours as needed for wheezing or shortness of breath.   amphetamine -dextroamphetamine  (ADDERALL XR) 20 MG 24 hr capsule Take 20 mg by mouth daily.   amphetamine -dextroamphetamine  (ADDERALL) 5 MG tablet Take 5 mg by mouth daily in the afternoon.   Fremanezumab -vfrm (AJOVY ) 225 MG/1.5ML SOAJ Inject 225 mg into the skin every 30 (thirty) days.   levocetirizine (XYZAL ) 5 MG tablet TAKE 1 TABLET(5 MG) BY MOUTH DAILY   levothyroxine  (SYNTHROID ) 150 MCG tablet TAKE 1 TABLET(150 MCG) BY MOUTH DAILY BEFORE BREAKFAST   lidocaine  (XYLOCAINE ) 2 % solution Use as directed 15 mLs in the mouth or throat every 3 (three) hours as needed for mouth pain. Apply topically to affected areas as needed for dental pain   montelukast  (SINGULAIR ) 10 MG tablet Take 1 tablet (10 mg total) by mouth at bedtime.  olmesartan -hydrochlorothiazide  (BENICAR  HCT) 20-12.5 MG tablet Take 1 tablet by mouth daily.   ondansetron  (ZOFRAN ) 4 MG tablet Take 1 tablet (4 mg total) by mouth every 8 (eight) hours as needed for nausea or vomiting.   tiZANidine  (ZANAFLEX ) 4  MG tablet Take 1 tablet (4 mg total) by mouth at bedtime.   Vitamin D , Ergocalciferol , (DRISDOL ) 1.25 MG (50000 UNIT) CAPS capsule Take 1 capsule (50,000 Units total) by mouth every 7 (seven) days.   No facility-administered encounter medications on file as of 06/16/2024.    ALLERGIES:  Allergies  Allergen Reactions   Poison Ivy Extract Anaphylaxis   Poison Oak Extract Anaphylaxis   Anser Anser Feather (Goose) Allergy Skin Test    Flexeril [Cyclobenzaprine] Hives   Lactose Intolerance (Gi) Diarrhea   Honeysuckle Flower [Lonicera] Rash   Mixed Grasses Rash    LABORATORY DATA:  I have reviewed the labs as listed.  CBC    Component Value Date/Time   WBC 9.4 06/09/2024 0812   RBC 4.72 06/09/2024 0812   HGB 14.2 06/09/2024 0812   HGB 13.9 12/04/2023 1127   HCT 42.2 06/09/2024 0812   HCT 42.5 12/04/2023 1127   PLT 443 (H) 06/09/2024 0812   PLT 407 12/04/2023 1127   MCV 89.4 06/09/2024 0812   MCV 94 12/04/2023 1127   MCH 30.1 06/09/2024 0812   MCHC 33.6 06/09/2024 0812   RDW 13.3 06/09/2024 0812   RDW 12.7 12/04/2023 1127   LYMPHSABS 2.9 06/09/2024 0812   LYMPHSABS 2.8 12/04/2023 1127   MONOABS 0.6 06/09/2024 0812   EOSABS 0.3 06/09/2024 0812   EOSABS 0.2 12/04/2023 1127   BASOSABS 0.1 06/09/2024 0812   BASOSABS 0.1 12/04/2023 1127      Latest Ref Rng & Units 05/28/2024    8:24 AM 03/08/2024    8:12 AM 12/04/2023   11:27 AM  CMP  Glucose 70 - 99 mg/dL 80  97  79   BUN 6 - 20 mg/dL 12  7  4    Creatinine 0.57 - 1.00 mg/dL 9.04  9.15  9.22   Sodium 134 - 144 mmol/L 137  139  139   Potassium 3.5 - 5.2 mmol/L 3.9  4.0  4.2   Chloride 96 - 106 mmol/L 101  104  102   CO2 20 - 29 mmol/L 23  25  22    Calcium 8.7 - 10.2 mg/dL 8.9  9.7  9.3   Total Protein 6.0 - 8.5 g/dL 7.3  7.9  7.3   Total Bilirubin 0.0 - 1.2 mg/dL 0.3  0.3  0.3   Alkaline Phos 41 - 116 IU/L 89  79  84   AST 0 - 40 IU/L 12  16  13    ALT 0 - 32 IU/L 14  21  16      DIAGNOSTIC IMAGING:  I have  independently reviewed the relevant imaging and discussed with the patient.   WRAP UP:  All questions were answered. The patient knows to call the clinic with any problems, questions or concerns.  Medical decision making: Low  Time spent on visit: I spent 15 minutes counseling the patient face to face. The total time spent in the appointment was 22 minutes and more than 50% was on counseling.  Pleasant CHRISTELLA Barefoot, PA-C  06/16/24 9:05 AM  "

## 2024-06-16 ENCOUNTER — Inpatient Hospital Stay: Admitting: Physician Assistant

## 2024-06-16 VITALS — BP 141/81 | HR 67 | Temp 98.8°F | Resp 18 | Ht 67.0 in | Wt 296.0 lb

## 2024-06-16 DIAGNOSIS — D75839 Thrombocytosis, unspecified: Secondary | ICD-10-CM

## 2024-06-16 DIAGNOSIS — D500 Iron deficiency anemia secondary to blood loss (chronic): Secondary | ICD-10-CM | POA: Diagnosis not present

## 2024-06-16 NOTE — Patient Instructions (Signed)
 [facility] at [facility] **VISIT SUMMARY & IMPORTANT INSTRUCTIONS **   You were seen today by Pleasant Barefoot PA-C for your follow-up visit.    ELEVATED PLATELETS Your platelets remain mildly elevated, which is likely reactive due to iron deficiency, obesity, and vaping.  IRON DEFICIENCY Your red blood cells/hemoglobin and iron levels look great! You do not need any IV iron at this time. Continue to take your iron supplement (325 mg ferrous sulfate every other day).  FOLLOW-UP APPOINTMENT: Labs and office visit in 6 months  ** Thank you for trusting me with your healthcare!  I strive to provide all of my patients with quality care at each visit.  If you receive a survey for this visit, I would be so grateful to you for taking the time to provide feedback.  Thank you in advance!  ~ Azhar Yogi                                        Dr. Mickiel Davonna Pleasant Barefoot, PA-C          Delon Hope, NP   - - - - - - - - - - - - - - - - - -     Thank you for choosing [facility] at [facility] to provide your oncology and hematology care.  To afford each patient quality time with our provider, please arrive at least 15 minutes before your scheduled appointment time.   If you have a lab appointment with the Cancer Center please come in thru the Main Entrance and check in at the main information desk.  You need to re-schedule your appointment should you arrive 10 or more minutes late.  We strive to give you quality time with our providers, and arriving late affects you and other patients whose appointments are after yours.  Also, if you no show three or more times for appointments you may be dismissed from the clinic at the providers discretion.     Again, thank you for choosing [facility].  Our hope is that these requests will decrease the amount of time that you wait before being seen by our physicians.        _____________________________________________________________  Should you have questions after your visit to [facility], please contact our office at [phone] and follow the prompts.  Our office hours are 8:00 a.m. and 4:30 p.m. Monday - Friday.  Please note that voicemails left after 4:00 p.m. may not be returned until the following business day.  We are closed weekends and major holidays.  You do have access to a nurse 24-7, just call the main number to the clinic [phone] and do not press any options, hold on the line and a nurse will answer the phone.    For prescription refill requests, have your pharmacy contact our office and allow 72 hours.

## 2024-09-08 ENCOUNTER — Ambulatory Visit: Admitting: Nurse Practitioner

## 2024-10-20 ENCOUNTER — Ambulatory Visit: Payer: Self-pay | Admitting: Nurse Practitioner

## 2024-12-07 ENCOUNTER — Inpatient Hospital Stay

## 2024-12-14 ENCOUNTER — Inpatient Hospital Stay: Admitting: Physician Assistant
